# Patient Record
Sex: Female | Born: 1940 | Race: White | Hispanic: No | Smoking: Former smoker
Health system: Southern US, Community
[De-identification: ages and names within clinical notes are randomized; demographics above are authoritative.]

## PROBLEM LIST (undated history)

## (undated) DIAGNOSIS — B019 Varicella without complication: Secondary | ICD-10-CM

## (undated) DIAGNOSIS — H269 Unspecified cataract: Secondary | ICD-10-CM

## (undated) DIAGNOSIS — M6740 Ganglion, unspecified site: Secondary | ICD-10-CM

## (undated) DIAGNOSIS — Z0000 Encounter for general adult medical examination without abnormal findings: Secondary | ICD-10-CM

## (undated) DIAGNOSIS — N6019 Diffuse cystic mastopathy of unspecified breast: Secondary | ICD-10-CM

## (undated) DIAGNOSIS — E663 Overweight: Secondary | ICD-10-CM

## (undated) DIAGNOSIS — R05 Cough: Secondary | ICD-10-CM

## (undated) DIAGNOSIS — M720 Palmar fascial fibromatosis [Dupuytren]: Secondary | ICD-10-CM

## (undated) DIAGNOSIS — M25562 Pain in left knee: Secondary | ICD-10-CM

## (undated) DIAGNOSIS — E119 Type 2 diabetes mellitus without complications: Secondary | ICD-10-CM

## (undated) DIAGNOSIS — E785 Hyperlipidemia, unspecified: Secondary | ICD-10-CM

## (undated) DIAGNOSIS — E7800 Pure hypercholesterolemia, unspecified: Secondary | ICD-10-CM

## (undated) DIAGNOSIS — M8580 Other specified disorders of bone density and structure, unspecified site: Secondary | ICD-10-CM

## (undated) DIAGNOSIS — H918X9 Other specified hearing loss, unspecified ear: Secondary | ICD-10-CM

## (undated) DIAGNOSIS — M1990 Unspecified osteoarthritis, unspecified site: Secondary | ICD-10-CM

## (undated) DIAGNOSIS — R197 Diarrhea, unspecified: Secondary | ICD-10-CM

## (undated) DIAGNOSIS — E1169 Type 2 diabetes mellitus with other specified complication: Secondary | ICD-10-CM

## (undated) DIAGNOSIS — L821 Other seborrheic keratosis: Secondary | ICD-10-CM

## (undated) DIAGNOSIS — E669 Obesity, unspecified: Secondary | ICD-10-CM

## (undated) DIAGNOSIS — M79602 Pain in left arm: Secondary | ICD-10-CM

## (undated) DIAGNOSIS — R5383 Other fatigue: Secondary | ICD-10-CM

## (undated) DIAGNOSIS — R5381 Other malaise: Secondary | ICD-10-CM

## (undated) DIAGNOSIS — K5900 Constipation, unspecified: Secondary | ICD-10-CM

## (undated) DIAGNOSIS — B059 Measles without complication: Secondary | ICD-10-CM

## (undated) DIAGNOSIS — B269 Mumps without complication: Secondary | ICD-10-CM

## (undated) HISTORY — DX: Constipation, unspecified: K59.00

## (undated) HISTORY — DX: Hyperlipidemia, unspecified: E78.5

## (undated) HISTORY — DX: Pain in left knee: M25.562

## (undated) HISTORY — DX: Varicella without complication: B01.9

## (undated) HISTORY — DX: Type 2 diabetes mellitus without complications: E11.9

## (undated) HISTORY — PX: BREAST BIOPSY: SHX20

## (undated) HISTORY — DX: Other malaise: R53.81

## (undated) HISTORY — DX: Encounter for general adult medical examination without abnormal findings: Z00.00

## (undated) HISTORY — DX: Cough: R05

## (undated) HISTORY — DX: Other specified disorders of bone density and structure, unspecified site: M85.80

## (undated) HISTORY — DX: Measles without complication: B05.9

## (undated) HISTORY — DX: Other specified hearing loss, unspecified ear: H91.8X9

## (undated) HISTORY — DX: Diffuse cystic mastopathy of unspecified breast: N60.19

## (undated) HISTORY — DX: Mumps without complication: B26.9

## (undated) HISTORY — DX: Unspecified osteoarthritis, unspecified site: M19.90

## (undated) HISTORY — DX: Unspecified cataract: H26.9

## (undated) HISTORY — DX: Type 2 diabetes mellitus with other specified complication: E11.69

## (undated) HISTORY — DX: Ganglion, unspecified site: M67.40

## (undated) HISTORY — DX: Palmar fascial fibromatosis (dupuytren): M72.0

## (undated) HISTORY — DX: Overweight: E66.3

## (undated) HISTORY — DX: Other fatigue: R53.83

## (undated) HISTORY — DX: Other seborrheic keratosis: L82.1

## (undated) HISTORY — DX: Obesity, unspecified: E66.9

## (undated) HISTORY — DX: Diarrhea, unspecified: R19.7

## (undated) HISTORY — DX: Pain in left arm: M79.602

---
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
## 2004-07-12 ENCOUNTER — Ambulatory Visit (HOSPITAL_COMMUNITY): Admission: RE | Admit: 2004-07-12 | Discharge: 2004-07-12 | Payer: Self-pay | Admitting: Internal Medicine

## 2005-08-29 ENCOUNTER — Other Ambulatory Visit: Admission: RE | Admit: 2005-08-29 | Discharge: 2005-08-29 | Payer: Self-pay | Admitting: Internal Medicine

## 2010-06-01 ENCOUNTER — Encounter (INDEPENDENT_AMBULATORY_CARE_PROVIDER_SITE_OTHER): Payer: Self-pay | Admitting: *Deleted

## 2010-06-29 ENCOUNTER — Encounter (INDEPENDENT_AMBULATORY_CARE_PROVIDER_SITE_OTHER): Payer: Self-pay

## 2010-07-04 ENCOUNTER — Encounter (INDEPENDENT_AMBULATORY_CARE_PROVIDER_SITE_OTHER): Payer: Self-pay

## 2010-07-04 ENCOUNTER — Ambulatory Visit: Payer: Self-pay | Admitting: Gastroenterology

## 2010-07-18 ENCOUNTER — Ambulatory Visit: Payer: Self-pay | Admitting: Gastroenterology

## 2010-07-20 ENCOUNTER — Encounter: Payer: Self-pay | Admitting: Gastroenterology

## 2010-08-31 ENCOUNTER — Ambulatory Visit (HOSPITAL_COMMUNITY): Admission: RE | Admit: 2010-08-31 | Discharge: 2010-08-31 | Payer: Self-pay | Admitting: Internal Medicine

## 2011-01-23 NOTE — Procedures (Signed)
Summary: Colonoscopy  Patient: [name] Note: All result statuses are Final unless otherwise noted.  Tests: (1) Colonoscopy (COL)   COL Colonoscopy           DONE     [facility]     [address]     [place], [place]  [address]           COLONOSCOPY PROCEDURE REPORT           PATIENT:  Gail Lester, Gail Lester  MR#:  [id number]     BIRTHDATE:  May 19, 1941, 69 yrs. old  GENDER:  female           ENDOSCOPIST:  Barbette Hair. Arlyce Dice, MD     Referred by:           PROCEDURE DATE:  07/18/2010     PROCEDURE:  Colonoscopy with snare polypectomy     ASA CLASS:  Class II     INDICATIONS:  1) screening  2) history of pre-cancerous     (adenomatous) colon polyps Last colo 20 years ago           MEDICATIONS:  None           DESCRIPTION OF PROCEDURE:   After the risks benefits and     alternatives of the procedure were thoroughly explained, informed     consent was obtained.  Digital rectal exam was performed and     revealed no abnormalities.   The [id number] [id number] endoscope was     introduced through the anus and advanced to the cecum, which was     identified by the ileocecal valve, without limitations.  The     quality of the prep was excellent, using MoviPrep.  The instrument     was then slowly withdrawn as the colon was fully examined.     <<PROCEDUREIMAGES>>           FINDINGS:  A sessile polyp was found in the cecum. It was 6 mm in     size. Polyp was snared without cautery. Retrieval was successful     (see image3). snare polyp  Moderate diverticulosis was found in     the sigmoid to descending colon segments (see image15 and     image14).  Internal hemorrhoids were found (see image17).  This     was otherwise a normal examination of the colon (see image4,     image5, image6, image7, image9, image13, and image16).     Retroflexed views in the rectum revealed no abnormalities.    The     time to cecum =  4.0  minutes. The scope was then withdrawn (time     =  7.75  min) from the  patient and the procedure completed.           COMPLICATIONS:  None           ENDOSCOPIC IMPRESSION:     1) 6 mm sessile polyp in the cecum     2) Moderate diverticulosis in the sigmoid to descending colon     segments     3) Internal hemorrhoids     4) Otherwise normal examination     RECOMMENDATIONS:     1) If the polyp(s) removed today are proven to be adenomatous     (pre-cancerous) polyps, you will need a repeat colonoscopy in 5     years. Otherwise you should continue to follow colorectal cancer     screening guidelines for "routine risk" patients  with colonoscopy     in 10 years.           REPEAT EXAM:   You will receive a letter from Dr. Arlyce Dice in 1-2     weeks, after reviewing the final pathology, with followup     recommendations.           ______________________________     Barbette Hair Arlyce Dice, MD           CC: Marisue Brooklyn, DO           n.     eSIGNED:   Barbette Hair. Kindal Ponti at 07/18/2010 10:23 AM           Page 2 of 3   Kathryne Sharper, [id number]  Note: An exclamation mark (!) indicates a result that was not dispersed into the flowsheet. Document Creation Date: 07/18/2010 10:24 AM _______________________________________________________________________  (1) Order result status: Final Collection or observation date-time: 07/18/2010 10:16 Requested date-time:  Receipt date-time:  Reported date-time:  Referring Physician:   Ordering Physician: Melvia Heaps [phone]) Specimen Source:  Source: Launa Grill Order Number: [id number] Lab site:   Appended Document: Colonoscopy     Procedures Next Due Date:    Colonoscopy: 07/2015

## 2011-01-23 NOTE — Letter (Signed)
Summary: Diabetic Instructions  [facility]  [address] [place], [place] [address]   Phone: [phone]  Fax: [phone]    Gail Lester Oct 12, 1941 MRN: [id number]   _  _   ORAL DIABETIC MEDICATION INSTRUCTIONS  The day before your procedure:   Take your diabetic pill as you do normally  The day of your procedure:   Do not take your diabetic pill    We will check your blood sugar levels during the admission process and again in Recovery before discharging you home  ________________________________________________________________________  _  _   INSULIN (LONG ACTING) MEDICATION INSTRUCTIONS (Lantus, NPH, 70/30, Humulin, Novolin-N)   The day before your procedure:   Take  your regular evening dose    The day of your procedure:   Do not take your morning dose    _  _   INSULIN (SHORT ACTING) MEDICATION INSTRUCTIONS (Regular, Humulog, Novolog)   The day before your procedure:   Do not take your evening dose   The day of your procedure:   Do not take your morning dose   _  _   INSULIN PUMP MEDICATION INSTRUCTIONS  We will contact the physician managing your diabetic care for written dosage instructions for the day before your procedure and the day of your procedure.  Once we have received the instructions, we will contact you.

## 2011-01-23 NOTE — Letter (Signed)
Summary: Previsit letter  [facility]  [address] [place], [place] [address]   Phone: [phone]  Fax: [phone]       06/01/2010 MRN: [id number]  Gail Lester [address] [place], [place]  [address]  Dear Gail Lester,  Welcome to the Gastroenterology Division at [facility].    You are scheduled to see a nurse for your pre-procedure visit on 07-04-10 at 10:30a.m. on the 3rd floor at [facility], [address]  We ask that you try to arrive at our office 15 minutes prior to your appointment time to allow for check-in.  Your nurse visit will consist of discussing your medical and surgical history, your immediate family medical history, and your medications.    Please bring a complete list of all your medications or, if you prefer, bring the medication bottles and we will list them.  We will need to be aware of both prescribed and over the counter drugs.  We will need to know exact dosage information as well.  If you are on blood thinners (Coumadin, Plavix, Aggrenox, Ticlid, etc.) please call our office today/prior to your appointment, as we need to consult with your physician about holding your medication.   Please be prepared to read and sign documents such as consent forms, a financial agreement, and acknowledgement forms.  If necessary, and with your consent, a friend or relative is welcome to sit-in on the nurse visit with you.  Please bring your insurance card so that we may make a copy of it.  If your insurance requires a referral to see a specialist, please bring your referral form from your primary care physician.  No co-pay is required for this nurse visit.     If you cannot keep your appointment, please call [phone] to cancel or reschedule prior to your appointment date.  This allows [place] the opportunity to schedule an appointment for another patient in need of care.    Thank you for choosing [facility] for your medical  needs.  We appreciate the opportunity to care for you.  Please visit [place] at our website  to learn more about our practice.                     Sincerely.                                                                                                                   The Gastroenterology Division

## 2011-01-23 NOTE — Letter (Signed)
Summary: [facility] Instructions  [facility]  [address] [place], [place] [address]   Phone: [phone]  Fax: [phone]       Gail Lester    March 04, 1941    MRN: [id number]        Procedure Day /Date:  Tuesday 7/26     Arrival Time: 9:00 am      Procedure Time: 10:00 am     Location of Procedure:                    _ x_  [facility] (4th Floor)                        PREPARATION FOR COLONOSCOPY WITH MOVIPREP   Starting 5 days prior to your procedure Thursday 7/21 do not eat nuts, seeds, popcorn, corn, beans, peas,  salads, or any raw vegetables.  Do not take any fiber supplements (e.g. Metamucil, Citrucel, and Benefiber).  THE DAY BEFORE YOUR PROCEDURE         DATE: Monday 7/25  1.  Drink clear liquids the entire day-NO SOLID FOOD  2.  Do not drink anything colored red or purple.  Avoid juices with pulp.  No orange juice.  3.  Drink at least 64 oz. (8 glasses) of fluid/clear liquids during the day to prevent dehydration and help the prep work efficiently.  CLEAR LIQUIDS INCLUDE: Water Jello Ice Popsicles Tea (sugar ok, no milk/cream) Powdered fruit flavored drinks Coffee (sugar ok, no milk/cream) Gatorade Juice: apple, white grape, white cranberry  Lemonade Clear bullion, consomm, broth Carbonated beverages (any kind) Strained chicken noodle soup Hard Candy                             4.  In the morning, mix first dose of MoviPrep solution:    Empty 1 Pouch A and 1 Pouch B into the disposable container    Add lukewarm drinking water to the top line of the container. Mix to dissolve    Refrigerate (mixed solution should be used within 24 hrs)  5.  Begin drinking the prep at 5:00 p.m. The MoviPrep container is divided by 4 marks.   Every 15 minutes drink the solution down to the next mark (approximately 8 oz) until the full liter is complete.   6.  Follow completed prep with 16 oz of clear liquid of your choice (Nothing red or  purple).  Continue to drink clear liquids until bedtime.  7.  Before going to bed, mix second dose of MoviPrep solution:    Empty 1 Pouch A and 1 Pouch B into the disposable container    Add lukewarm drinking water to the top line of the container. Mix to dissolve    Refrigerate  THE DAY OF YOUR PROCEDURE      DATE: Tuesday 7/26  Beginning at 5:00 a.m. (5 hours before procedure):         1. Every 15 minutes, drink the solution down to the next mark (approx 8 oz) until the full liter is complete.  2. Follow completed prep with 16 oz. of clear liquid of your choice.    3. You may drink clear liquids until 8:00 am  (2 HOURS BEFORE PROCEDURE).   MEDICATION INSTRUCTIONS  Unless otherwise instructed, you should take regular prescription medications with a small sip of water   as early as possible the morning  of your procedure.         OTHER INSTRUCTIONS  You will need a responsible adult at least 70 years of age to accompany you and drive you home.   This person must remain in the waiting room during your procedure.  Wear loose fitting clothing that is easily removed.  Leave jewelry and other valuables at home.  However, you may wish to bring a book to read or  an iPod/MP3 player to listen to music as you wait for your procedure to start.  Remove all body piercing jewelry and leave at home.  Total time from sign-in until discharge is approximately 2-3 hours.  You should go home directly after your procedure and rest.  You can resume normal activities the  day after your procedure.  The day of your procedure you should not:   Drive   Make legal decisions   Operate machinery   Drink alcohol   Return to work  You will receive specific instructions about eating, activities and medications before you leave.    The above instructions have been reviewed and explained to me by   Ulis Rias RN  July 04, 2010 10:53 AM     I fully understand and can verbalize  these instructions _____________________________ Date _________

## 2011-01-23 NOTE — Letter (Signed)
Summary: Patient Notice- Polyp Results  [facility]  [address] [place], [place] [address]   Phone: [phone]  Fax: [phone]        July 20, 2010 MRN: [id number]    [facility] [address] [place], [place]  [address]    Dear Ms. Celestine,  I am pleased to inform you that the colon polyp(s) removed during your recent colonoscopy was (were) found to be benign (no cancer detected) upon pathologic examination.  I recommend you have a repeat colonoscopy examination in 5_ years to look for recurrent polyps, as having colon polyps increases your risk for having recurrent polyps or even colon cancer in the future.  Should you develop new or worsening symptoms of abdominal pain, bowel habit changes or bleeding from the rectum or bowels, please schedule an evaluation with either your primary care physician or with me.  Additional information/recommendations:  __ No further action with gastroenterology is needed at this time. Please      follow-up with your primary care physician for your other healthcare      needs.  __ Please call [phone] to schedule a return visit to review your      situation.  __ Please keep your follow-up visit as already scheduled.  _x_ Continue treatment plan as outlined the day of your exam.  Please call us if you are having persistent problems or have questions about your condition that have not been fully answered at this time.  Sincerely,  Louis Meckel MD  This letter has been electronically signed by your physician.  Appended Document: Patient Notice- Polyp Results letter mailed

## 2011-01-23 NOTE — Miscellaneous (Signed)
Summary: Lec previsit  Clinical Lists Changes  Medications: Added new medication of MOVIPREP 100 GM  SOLR (PEG-KCL-NACL-NASULF-NA ASC-C) As per prep instructions. - Signed Rx of MOVIPREP 100 GM  SOLR (PEG-KCL-NACL-NASULF-NA ASC-C) As per prep instructions.;  #1 x 0;  Signed;  Entered by: Ulis Rias RN;  Authorized by: Louis Meckel MD;  Method used: Electronically to [facility]  [phone]*, [address], [place], [place]  [address], Ph: [phone] or [phone], Fax: [phone] Observations: Added new observation of NKA: T (07/04/2010 10:19)    Prescriptions: MOVIPREP 100 GM  SOLR (PEG-KCL-NACL-NASULF-NA ASC-C) As per prep instructions.  #1 x 0   Entered by:   Ulis Rias RN   Authorized by:   Louis Meckel MD   Signed by:   Ulis Rias RN on 07/04/2010   Method used:   Electronically to        [facility]  [phone]* (retail)       [address] [place], [place]  [address]       Ph: [phone] or [phone]       Fax: [phone]   RxID:   [id number]

## 2011-03-10 LAB — GLUCOSE, CAPILLARY: Glucose-Capillary: 144 mg/dL — ABNORMAL HIGH (ref 70–99)

## 2011-12-08 ENCOUNTER — Emergency Department (HOSPITAL_COMMUNITY): Payer: Medicare Other

## 2011-12-08 ENCOUNTER — Inpatient Hospital Stay (HOSPITAL_COMMUNITY)
Admission: EM | Admit: 2011-12-08 | Discharge: 2011-12-10 | DRG: 512 | Disposition: A | Discharge status: Home / Self Care | Payer: Medicare Other | Attending: Orthopedic Surgery | Admitting: Orthopedic Surgery

## 2011-12-08 ENCOUNTER — Inpatient Hospital Stay (HOSPITAL_COMMUNITY): Payer: Medicare Other

## 2011-12-08 DIAGNOSIS — S52509A Unspecified fracture of the lower end of unspecified radius, initial encounter for closed fracture: Principal | ICD-10-CM | POA: Diagnosis present

## 2011-12-08 DIAGNOSIS — S52609A Unspecified fracture of lower end of unspecified ulna, initial encounter for closed fracture: Principal | ICD-10-CM | POA: Diagnosis present

## 2011-12-08 DIAGNOSIS — W010XXA Fall on same level from slipping, tripping and stumbling without subsequent striking against object, initial encounter: Secondary | ICD-10-CM | POA: Diagnosis present

## 2011-12-08 DIAGNOSIS — E119 Type 2 diabetes mellitus without complications: Secondary | ICD-10-CM | POA: Diagnosis present

## 2011-12-08 HISTORY — DX: Pure hypercholesterolemia, unspecified: E78.00

## 2011-12-08 LAB — DIFFERENTIAL
Basophils Absolute: 0 10*3/uL (ref 0.0–0.1)
Eosinophils Relative: 0 % (ref 0–5)
Lymphocytes Relative: 15 % (ref 12–46)
Lymphs Abs: 1.7 10*3/uL (ref 0.7–4.0)
Monocytes Absolute: 0.5 10*3/uL (ref 0.1–1.0)

## 2011-12-08 LAB — BASIC METABOLIC PANEL
CO2: 22 mEq/L (ref 19–32)
Calcium: 9.2 mg/dL (ref 8.4–10.5)
Creatinine, Ser: 0.5 mg/dL (ref 0.50–1.10)
Glucose, Bld: 121 mg/dL — ABNORMAL HIGH (ref 70–99)

## 2011-12-08 LAB — CBC
HCT: 38.5 % (ref 36.0–46.0)
MCH: 29.1 pg (ref 26.0–34.0)
MCV: 88.9 fL (ref 78.0–100.0)
RDW: 12.8 % (ref 11.5–15.5)
WBC: 11.3 10*3/uL — ABNORMAL HIGH (ref 4.0–10.5)

## 2011-12-08 MED ORDER — METHOCARBAMOL 500 MG PO TABS
500.0000 mg | ORAL_TABLET | Freq: Four times a day (QID) | ORAL | Status: DC | PRN
  Administered 2011-12-08: 500 mg via ORAL
  Filled 2011-12-08: qty 1

## 2011-12-08 MED ORDER — ONDANSETRON HCL 4 MG PO TABS: 4.0000 mg | ORAL_TABLET | Freq: Four times a day (QID) | ORAL | Status: DC | PRN

## 2011-12-08 MED ORDER — DOCUSATE SODIUM 100 MG PO CAPS
100.0000 mg | ORAL_CAPSULE | Freq: Two times a day (BID) | ORAL | Status: DC
  Administered 2011-12-09: 100 mg via ORAL
  Filled 2011-12-08 (×5): qty 1

## 2011-12-08 MED ORDER — HYDROCODONE-ACETAMINOPHEN 5-325 MG PO TABS
1.0000 | ORAL_TABLET | ORAL | Status: DC | PRN
  Administered 2011-12-08 – 2011-12-09 (×3): 1 via ORAL
  Administered 2011-12-09: 2 via ORAL
  Administered 2011-12-09: 1 via ORAL
  Administered 2011-12-10 (×2): 2 via ORAL
  Filled 2011-12-08 (×3): qty 1
  Filled 2011-12-08: qty 2
  Filled 2011-12-08: qty 1
  Filled 2011-12-08 (×2): qty 2

## 2011-12-08 MED ORDER — ROSUVASTATIN CALCIUM 5 MG PO TABS
5.0000 mg | ORAL_TABLET | Freq: Every day | ORAL | Status: DC
  Administered 2011-12-09 – 2011-12-10 (×2): 5 mg via ORAL
  Filled 2011-12-08 (×3): qty 1

## 2011-12-08 MED ORDER — ONDANSETRON HCL 4 MG/2ML IJ SOLN: 4.0000 mg | Freq: Four times a day (QID) | INTRAMUSCULAR | Status: DC | PRN

## 2011-12-08 MED ORDER — CEFAZOLIN SODIUM 1-5 GM-% IV SOLN
1.0000 g | INTRAVENOUS | Status: AC
  Administered 2011-12-09: 1 g via INTRAVENOUS
  Filled 2011-12-08: qty 50

## 2011-12-08 MED ORDER — GLIMEPIRIDE 1 MG PO TABS
1.0000 mg | ORAL_TABLET | Freq: Every day | ORAL | Status: DC
  Administered 2011-12-10: 1 mg via ORAL
  Filled 2011-12-08 (×3): qty 1

## 2011-12-08 MED ORDER — MORPHINE SULFATE 2 MG/ML IJ SOLN
1.0000 mg | INTRAMUSCULAR | Status: DC | PRN
  Administered 2011-12-09 – 2011-12-10 (×2): 1 mg via INTRAVENOUS
  Filled 2011-12-08 (×2): qty 1

## 2011-12-08 MED ORDER — METHOCARBAMOL 100 MG/ML IJ SOLN
500.0000 mg | Freq: Four times a day (QID) | INTRAVENOUS | Status: DC | PRN
  Filled 2011-12-08: qty 5

## 2011-12-08 MED ORDER — POTASSIUM CHLORIDE IN NACL 20-0.45 MEQ/L-% IV SOLN
INTRAVENOUS | Status: DC
  Administered 2011-12-08: 21:00:00 via INTRAVENOUS
  Filled 2011-12-08 (×3): qty 1000

## 2011-12-08 MED ORDER — OXYCODONE HCL 5 MG PO TABS
5.0000 mg | ORAL_TABLET | ORAL | Status: DC | PRN
  Administered 2011-12-08: 5 mg via ORAL
  Filled 2011-12-08: qty 2

## 2011-12-08 MED ORDER — VITAMIN C 500 MG PO TABS
1000.0000 mg | ORAL_TABLET | Freq: Every day | ORAL | Status: DC
  Administered 2011-12-09 – 2011-12-10 (×2): 1000 mg via ORAL
  Filled 2011-12-08 (×3): qty 2

## 2011-12-08 MED ORDER — METFORMIN HCL 500 MG PO TABS
1000.0000 mg | ORAL_TABLET | Freq: Two times a day (BID) | ORAL | Status: DC
  Administered 2011-12-08: 500 mg via ORAL
  Administered 2011-12-09 – 2011-12-10 (×2): 1000 mg via ORAL
  Filled 2011-12-08 (×6): qty 2

## 2011-12-08 MED ORDER — LIDOCAINE HCL 2 % IJ SOLN
20.0000 mL | Freq: Once | INTRAMUSCULAR | Status: AC
  Administered 2011-12-08: 400 mg

## 2011-12-08 MED ORDER — THERA M PLUS PO TABS
1.0000 | ORAL_TABLET | Freq: Every day | ORAL | Status: DC
  Administered 2011-12-09 – 2011-12-10 (×2): 1 via ORAL
  Filled 2011-12-08 (×3): qty 1

## 2011-12-08 MED ORDER — HYDROCODONE-ACETAMINOPHEN 5-325 MG PO TABS: 1.0000 | ORAL_TABLET | Freq: Four times a day (QID) | ORAL | Status: AC | PRN

## 2011-12-08 NOTE — ED Notes (Signed)
Dr Melvyn Novas at bedside to reduce left wrist fx.

## 2011-12-08 NOTE — ED Provider Notes (Addendum)
History     CSN: [id number] Arrival date & time: 12/08/2011  2:13 PM   First MD Initiated Contact with Patient 12/08/11 1459      Chief Complaint  Patient presents with  . Fall    (Consider location/radiation/quality/duration/timing/severity/associated sxs/prior treatment) Patient is a 70 y.o. female presenting with fall. The history is provided by the patient.  Fall Incident onset: Today. Incident: Patient accidentally fell off a step on her porch. She landed on her left buttock and her left wrist. Distance fallen: Less than a foot. She landed on dirt. There was no blood loss. The pain is present in the left wrist. The pain is moderate. She was ambulatory at the scene. There was no entrapment after the fall. Pertinent negatives include no numbness, no abdominal pain, no headaches and no loss of consciousness. Exacerbated by: Movement and palpation.    Past Medical History  Diagnosis Date  . Diabetes mellitus   . Hypercholesteremia     Past Surgical History  Procedure Date  . Appendectomy     History reviewed. No pertinent family history.  History  Substance Use Topics  . Smoking status: Never Smoker   . Smokeless tobacco: Not on file  . Alcohol Use: No    OB History    Grav Para Term Preterm Abortions TAB SAB Ect Mult Living                  Review of Systems  Gastrointestinal: Negative for abdominal pain.  Neurological: Negative for loss of consciousness, numbness and headaches.  All other systems reviewed and are negative.    Allergies  Review of patient's allergies indicates no known allergies.  Home Medications   Current Outpatient Rx  Name Route Sig Dispense Refill  . GLIMEPIRIDE 4 MG PO TABS Oral Take 1 mg by mouth daily before breakfast.      . METFORMIN HCL 500 MG PO TABS Oral Take 1,000 mg by mouth 2 (two) times daily with a meal.      . ROSUVASTATIN CALCIUM 5 MG PO TABS Oral Take 5 mg by mouth daily.        BP 150/95  Pulse 92  Temp(Src)  98.2 F (36.8 C) (Oral)  Resp 18  Ht 5\' 4"  (1.626 m)  Wt 170 lb (77.111 kg)  BMI 29.18 kg/m2  SpO2 96%  Physical Exam  Nursing note and vitals reviewed. Constitutional: She appears well-developed and well-nourished. No distress.  HENT:  Head: Normocephalic and atraumatic.  Right Ear: External ear normal.  Left Ear: External ear normal.  Eyes: Conjunctivae are normal. Right eye exhibits no discharge. Left eye exhibits no discharge. No scleral icterus.  Neck: Neck supple. No tracheal deviation present.  Cardiovascular: Normal rate.   Pulmonary/Chest: Effort normal. No stridor. No respiratory distress.  Musculoskeletal: She exhibits edema and tenderness.       Left shoulder: Normal.       Left elbow: Normal.       Left wrist: She exhibits decreased range of motion, tenderness, bony tenderness, swelling and deformity. She exhibits no laceration.       Entire spine nontender, no tenderness left elbow, gross deformity left wrist  Neurological: She is alert. Cranial nerve deficit: no gross deficits.  Skin: Skin is warm and dry. No rash noted.  Psychiatric: She has a normal mood and affect.    ED Course  Procedures (including critical care time)  Labs Reviewed - No data to display Dg Wrist Complete Left  12/08/2011  *  RADIOLOGY REPORT*  Clinical Data: Larey Seat.  Injured left wrist.  LEFT WRIST - COMPLETE 3+ VIEW  Comparison: None  Findings: There is a dorsally impacted distal radius fracture with comminution and intra-articular involvement.  An ulnar styloid fracture is also noted.  The carpal and metacarpal bones are intact.  IMPRESSION: Dorsally impacted, comminuted intra-articular fracture of the distal radius. Ulnar styloid avulsion fracture.  Original Report Authenticated By: P. Loralie Champagne, M.D.    Diagnosis: Distal radius fracture with comminution and intra-articular involvement  MDM  Patient has a displaced intra-articular distal radius fracture. This likely will require  manipulation and reduction. I will consult orthopedics, hand surgery.  Dr Orlan Leavens will evaluate the patient in the ED.        Celene Kras, MD 12/08/11 1554  Celene Kras, MD 12/08/11 [phone]

## 2011-12-08 NOTE — ED Notes (Signed)
[phone]

## 2011-12-08 NOTE — ED Notes (Signed)
Pt. Fell  Off her porch onto her lt. Buttock and broke the fall with her lt. Arm.   Lt. Wrist is deformed, she denies any pain, +radial pulse, warm to touch

## 2011-12-09 ENCOUNTER — Inpatient Hospital Stay (HOSPITAL_COMMUNITY): Payer: Medicare Other | Admitting: Anesthesiology

## 2011-12-09 ENCOUNTER — Encounter (HOSPITAL_COMMUNITY)
Admission: EM | Disposition: A | Discharge status: Home / Self Care | Payer: Self-pay | Source: Home / Self Care | Attending: Orthopedic Surgery

## 2011-12-09 ENCOUNTER — Encounter (HOSPITAL_COMMUNITY): Payer: Self-pay | Admitting: Anesthesiology

## 2011-12-09 ENCOUNTER — Other Ambulatory Visit: Payer: Self-pay

## 2011-12-09 ENCOUNTER — Encounter (HOSPITAL_COMMUNITY): Payer: Self-pay | Admitting: *Deleted

## 2011-12-09 HISTORY — PX: ORIF WRIST FRACTURE: SHX2133

## 2011-12-09 LAB — GLUCOSE, CAPILLARY
Glucose-Capillary: 114 mg/dL — ABNORMAL HIGH (ref 70–99)
Glucose-Capillary: 126 mg/dL — ABNORMAL HIGH (ref 70–99)

## 2011-12-09 SURGERY — OPEN REDUCTION INTERNAL FIXATION (ORIF) WRIST FRACTURE
Anesthesia: General | Site: Wrist | Laterality: Left | Wound class: Clean

## 2011-12-09 MED ORDER — FENTANYL CITRATE 0.05 MG/ML IJ SOLN: 25.0000 ug | INTRAMUSCULAR | Status: DC | PRN

## 2011-12-09 MED ORDER — LACTATED RINGERS IV SOLN
INTRAVENOUS | Status: DC | PRN
  Administered 2011-12-09: 07:00:00 via INTRAVENOUS

## 2011-12-09 MED ORDER — 0.9 % SODIUM CHLORIDE (POUR BTL) OPTIME
TOPICAL | Status: DC | PRN
  Administered 2011-12-09: 1000 mL

## 2011-12-09 MED ORDER — ROPIVACAINE HCL 5 MG/ML IJ SOLN
INTRAMUSCULAR | Status: DC | PRN
  Administered 2011-12-09: 25 mL via EPIDURAL

## 2011-12-09 MED ORDER — METOCLOPRAMIDE HCL 5 MG/ML IJ SOLN
INTRAMUSCULAR | Status: DC | PRN
  Administered 2011-12-09: 10 mg via INTRAVENOUS

## 2011-12-09 MED ORDER — PROPOFOL 10 MG/ML IV EMUL
INTRAVENOUS | Status: DC | PRN
  Administered 2011-12-09: 200 mg via INTRAVENOUS

## 2011-12-09 MED ORDER — ASCORBIC ACID 1000 MG PO TABS: 1000.0000 mg | ORAL_TABLET | Freq: Every day | ORAL | Status: AC

## 2011-12-09 MED ORDER — METOCLOPRAMIDE HCL 5 MG/ML IJ SOLN
10.0000 mg | Freq: Once | INTRAMUSCULAR | Status: AC | PRN
  Filled 2011-12-09: qty 2

## 2011-12-09 MED ORDER — MIDAZOLAM HCL 5 MG/5ML IJ SOLN
INTRAMUSCULAR | Status: DC | PRN
  Administered 2011-12-09: 2 mg via INTRAVENOUS

## 2011-12-09 MED ORDER — DOCUSATE SODIUM 100 MG PO CAPS: 100.0000 mg | ORAL_CAPSULE | Freq: Two times a day (BID) | ORAL | Status: AC

## 2011-12-09 MED ORDER — CEFAZOLIN SODIUM 1-5 GM-% IV SOLN
1.0000 g | Freq: Three times a day (TID) | INTRAVENOUS | Status: DC
  Administered 2011-12-09 – 2011-12-10 (×3): 1 g via INTRAVENOUS
  Filled 2011-12-09 (×5): qty 50

## 2011-12-09 MED ORDER — ONDANSETRON HCL 4 MG/2ML IJ SOLN
INTRAMUSCULAR | Status: DC | PRN
  Administered 2011-12-09: 4 mg via INTRAVENOUS

## 2011-12-09 MED ORDER — FENTANYL CITRATE 0.05 MG/ML IJ SOLN
INTRAMUSCULAR | Status: DC | PRN
  Administered 2011-12-09: 100 ug via INTRAVENOUS

## 2011-12-09 MED ORDER — HYDROCODONE-ACETAMINOPHEN 5-325 MG PO TABS: 1.0000 | ORAL_TABLET | ORAL | Status: AC | PRN

## 2011-12-09 MED ORDER — LIDOCAINE HCL 1 % IJ SOLN
INTRAMUSCULAR | Status: DC | PRN
  Administered 2011-12-09: 2 mL via INTRADERMAL

## 2011-12-09 SURGICAL SUPPLY — 63 items
BANDAGE ACE 4 STERILE (GAUZE/BANDAGES/DRESSINGS) ×2 IMPLANT
BANDAGE ELASTIC 3 VELCRO ST LF (GAUZE/BANDAGES/DRESSINGS) ×2 IMPLANT
BANDAGE ELASTIC 4 VELCRO ST LF (GAUZE/BANDAGES/DRESSINGS) ×2 IMPLANT
BANDAGE GAUZE ELAST BULKY 4 IN (GAUZE/BANDAGES/DRESSINGS) ×2 IMPLANT
BIT DRILL 2 FAST STEP (BIT) ×2 IMPLANT
BIT DRILL 2.5X4 QC (BIT) ×2 IMPLANT
BLADE SURG ROTATE 9660 (MISCELLANEOUS) IMPLANT
BNDG ESMARK 4X9 LF (GAUZE/BANDAGES/DRESSINGS) ×4 IMPLANT
CLOTH BEACON ORANGE TIMEOUT ST (SAFETY) ×2 IMPLANT
CORDS BIPOLAR (ELECTRODE) ×2 IMPLANT
COVER SURGICAL LIGHT HANDLE (MISCELLANEOUS) ×2 IMPLANT
CUFF TOURNIQUET SINGLE 18IN (TOURNIQUET CUFF) ×2 IMPLANT
CUFF TOURNIQUET SINGLE 24IN (TOURNIQUET CUFF) IMPLANT
DRAIN TLS ROUND 10FR (DRAIN) IMPLANT
DRAPE OEC MINIVIEW 54X84 (DRAPES) ×2 IMPLANT
DRAPE SURG 17X11 SM STRL (DRAPES) ×2 IMPLANT
DRSG ADAPTIC 3X8 NADH LF (GAUZE/BANDAGES/DRESSINGS) ×2 IMPLANT
DRSG EMULSION OIL 3X3 NADH (GAUZE/BANDAGES/DRESSINGS) ×2 IMPLANT
ELECT REM PT RETURN 9FT ADLT (ELECTROSURGICAL)
ELECTRODE REM PT RTRN 9FT ADLT (ELECTROSURGICAL) IMPLANT
GAUZE SPONGE 4X4 12PLY STRL LF (GAUZE/BANDAGES/DRESSINGS) ×2 IMPLANT
GAUZE SPONGE 4X4 16PLY XRAY LF (GAUZE/BANDAGES/DRESSINGS) ×2 IMPLANT
GLOVE BIOGEL PI IND STRL 8.5 (GLOVE) ×1 IMPLANT
GLOVE BIOGEL PI INDICATOR 8.5 (GLOVE) ×1
GLOVE SURG ORTHO 8.0 STRL STRW (GLOVE) ×4 IMPLANT
GOWN PREVENTION PLUS XLARGE (GOWN DISPOSABLE) ×2 IMPLANT
GOWN STRL NON-REIN LRG LVL3 (GOWN DISPOSABLE) ×6 IMPLANT
K-WIRE 1.6 (WIRE) ×2
K-WIRE FX5X1.6XNS BN SS (WIRE) ×1
KIT BASIN OR (CUSTOM PROCEDURE TRAY) ×2 IMPLANT
KIT ROOM TURNOVER OR (KITS) ×2 IMPLANT
KWIRE FX5X1.6XNS BN SS (WIRE) ×1 IMPLANT
MANIFOLD NEPTUNE II (INSTRUMENTS) ×2 IMPLANT
NEEDLE HYPO 25X1 1.5 SAFETY (NEEDLE) ×2 IMPLANT
NS IRRIG 1000ML POUR BTL (IV SOLUTION) ×2 IMPLANT
PACK ORTHO EXTREMITY (CUSTOM PROCEDURE TRAY) ×2 IMPLANT
PAD ARMBOARD 7.5X6 YLW CONV (MISCELLANEOUS) ×4 IMPLANT
PAD CAST 4YDX4 CTTN HI CHSV (CAST SUPPLIES) ×1 IMPLANT
PADDING CAST COTTON 4X4 STRL (CAST SUPPLIES) ×1
PADDING WEBRIL 4 STERILE (GAUZE/BANDAGES/DRESSINGS) ×2 IMPLANT
PEG SUBCHONDRAL SMOOTH 2.0X16 (Peg) ×2 IMPLANT
PEG SUBCHONDRAL SMOOTH 2.0X18 (Peg) ×4 IMPLANT
PEG SUBCHONDRAL SMOOTH 2.0X20 (Peg) ×4 IMPLANT
PEG THREADED 2.5MMX18MM LONG (Peg) ×2 IMPLANT
PLATE SHORT 57 NRRW LT (Plate) ×2 IMPLANT
SCREW BN 12X3.5XNS CORT TI (Screw) ×1 IMPLANT
SCREW CORT 3.5X10 LNG (Screw) ×2 IMPLANT
SCREW CORT 3.5X12 (Screw) ×1 IMPLANT
SOAP 2 % CHG 4 OZ (WOUND CARE) ×2 IMPLANT
SPONGE GAUZE 4X4 12PLY (GAUZE/BANDAGES/DRESSINGS) ×2 IMPLANT
STRIP CLOSURE SKIN 1/2X4 (GAUZE/BANDAGES/DRESSINGS) IMPLANT
SUCTION FRAZIER TIP 10 FR DISP (SUCTIONS) ×2 IMPLANT
SUT ETHILON 4 0 PS 2 18 (SUTURE) ×2 IMPLANT
SUT MNCRL AB 4-0 PS2 18 (SUTURE) IMPLANT
SUT VIC AB 2-0 FS1 27 (SUTURE) ×2 IMPLANT
SUT VICRYL 4-0 PS2 18IN ABS (SUTURE) ×2 IMPLANT
SYR CONTROL 10ML LL (SYRINGE) ×2 IMPLANT
SYSTEM CHEST DRAIN TLS 7FR (DRAIN) IMPLANT
TOWEL OR 17X24 6PK STRL BLUE (TOWEL DISPOSABLE) ×2 IMPLANT
TOWEL OR 17X26 10 PK STRL BLUE (TOWEL DISPOSABLE) ×2 IMPLANT
TUBE CONNECTING 12X1/4 (SUCTIONS) ×2 IMPLANT
WATER STERILE IRR 1000ML POUR (IV SOLUTION) IMPLANT
YANKAUER SUCT BULB TIP NO VENT (SUCTIONS) IMPLANT

## 2011-12-09 NOTE — Addendum Note (Signed)
Addendum  created 12/09/11 1206 by Benedetto Goad   Modules edited:Anesthesia Responsible Staff

## 2011-12-09 NOTE — Anesthesia Preprocedure Evaluation (Addendum)
Anesthesia Evaluation  Patient identified by MRN, date of birth, ID band Patient awake    Reviewed: Allergy & Precautions, H&P , NPO status , Patient's Chart, lab work & pertinent test results, reviewed documented beta blocker date and time   Airway Mallampati: II TM Distance: >3 FB Neck ROM: full    Dental  (+) Dental Advisory Given   Pulmonary neg pulmonary ROS,          Cardiovascular neg cardio ROS  Hypercholesterol   Neuro/Psych Negative Neurological ROS  Negative Psych ROS   GI/Hepatic negative GI ROS, Neg liver ROS,   Endo/Other  Diabetes mellitus-, Type 2, Oral Hypoglycemic Agents  Renal/GU negative Renal ROS  Genitourinary negative   Musculoskeletal   Abdominal   Peds  Hematology negative hematology ROS (+)   Anesthesia Other Findings See surgeon's H&P   Reproductive/Obstetrics negative OB ROS                         Anesthesia Physical Anesthesia Plan  ASA: II  Anesthesia Plan: General   Post-op Pain Management: MAC Combined w/ Regional for Post-op pain   Induction: Intravenous  Airway Management Planned: LMA  Additional Equipment:   Intra-op Plan:   Post-operative Plan: Extubation in OR  Informed Consent: I have reviewed the patients History and Physical, chart, labs and discussed the procedure including the risks, benefits and alternatives for the proposed anesthesia with the patient or authorized representative who has indicated his/her understanding and acceptance.     Plan Discussed with: CRNA, Surgeon and Anesthesiologist  Anesthesia Plan Comments:        Anesthesia Quick Evaluation

## 2011-12-09 NOTE — H&P (Unsigned)
NAMESHAMIRAH, IVAN NO.:  [id number]  MEDICAL RECORD NO.:  [id number]  LOCATION:  5035                         FACILITY:  [facility]  PHYSICIAN:  Madelynn Done, MD  DATE OF BIRTH:  1941/05/17  DATE OF ADMISSION:  12/08/2011 DATE OF DISCHARGE:                             HISTORY & PHYSICAL   REASON FOR ADMISSION:  Left distal radius fracture.  BRIEF HISTORY:  Mrs. Delira is a healthy-appearing 70 year old female who lives at home.  She tripped and fell back off a stair landing on an outstretched left wrist.  The patient sustained a comminuted distal radius fracture.  The patient was seen and evaluated in the ER.  I was asked to see and evaluate the patient in the emergency department given the comminuted fracture and the degree of displacement.  The patient underwent closed manipulation in the ER and under closed manipulation, it was very unstable fracture pattern and difficult to maintain reduction.  The patient will be admitted for IV pain control and schedule operative intervention to restore the alignment of the wrist in the morning.  The patient has no other complaints.  No syncope.  No chest pain.  No dizziness.  No other related symptoms.  No history of trauma to the left wrist.  Her past medical history, past surgical history, medications, allergies, review of systems as noted through the notes of Dr. Lynelle Doctor and documented in the Epic computer system.  PHYSICAL EXAMINATION:  GENERAL:  She is a healthy-appearing female.  She has a normal hand coordination in her right hand.  Normal mood.  She is alert and oriented to person, place, and time. VITAL SIGNS:  As noted in the computer. HEENT:  Pupils equal.  Sclerae are white.  Head atraumatic. NECK:  No visible masses. CHEST:  Equal chest expansion.  No audible wheezing. CARDIOVASCULAR:  Regular radial pulse. ABDOMEN:  Nondistended, nontender. EXTREMITIES:  On examination of the left upper extremity, she  does have the obvious deformity of the distal radius.  She was able to extend her thumb, extend her digits.  Her fingertips are warm, well perfused, good capillary refill, good sensation.  Compartments are soft.  She has good elbow flexion and extension.  She has good shoulder mobility.  Her wrist and forearm rotation are limited.  Her radiographs do show the comminuted and dorsally angulated intra- articular distal radius fracture with a shortening noted.  PROCEDURE NOTE:  After verbal consent was obtained with the patient, 1% Xylocaine hematoma block was performed.  The patient tolerated this well.  Following this, a closed manipulation was then performed and the patient was placed in a well-molded sugar-tong splint.  This will be reduced with using the finger traps and weight and it was very unstable, very difficult to maintain the reduction.  IMPRESSION:  Left wrist highly comminuted distal radius fracture, intra- articular with displacement.  PLAN:  Today, the findings were reviewed with Ms. Borowski.  She lives alone.  We talked about surgical correction and maintaining the overall position of the distal radius, and the patient would like to proceed with operative intervention.  For this, I would recommend, given the displacement, an  open reduction and internal fixation.  The risks of surgery include but not limited to bleeding; infection; damage to nearby nerves, arteries, or tendons; nonunion; malunion; hardware failure; loss of motion of wrist and digits, and need for further surgical intervention.  All questions were answered and encouraged today.  The patient will be admitted, and inpatient orders were carried out.     Madelynn Done, MD     FWO/MEDQ  D:  12/08/2011  T:  12/09/2011  Job:  [id number]

## 2011-12-09 NOTE — Op Note (Signed)
PREOPERATIVE DIAGNOSIS: Left wrist intra-articular distal radius  fracture, 3 or more fragments.  POSTOPERATIVE DIAGNOSIS: Left wrist intra-articular distal radius  fracture, 3 or more fragments.  ATTENDING PHYSICIAN: Sharma Covert IV, MD who scrubbed and present  entire procedure.  ASSISTANT SURGEON: None.  ANESTHESIA: Supraclavicular block performed by Dr. Cipriano Mile and general  anesthesia.  SURGICAL IMPLANTS: [employer] DVR plate, narrow with six distal locking pegs and 3 bicortical screws proximally SURGICAL PROCEDURE:  1. Open treatment of left wrist intra-articular distal radius  fracture, 3 or more fragments.  2. Left wrist brachioradialis tenotomy and release.  3. Radiographs, stress radiographs, left wrist.   SURGICAL INDICATIONS: Gail Lester  is a right-hand-dominant female who sustained an intra-articular distal radius fracture after a fall. The  patient was seen and evaluated in the ED based on degree of  displacement and the  displacement, recommended that she undergo  the above procedure. Risks, benefits, and alternatives were discussed  in detail with the patient. Signed informed consent was obtained.  Risks include, but not limited to bleeding, infection, damage to nearby  nerves, arteries, or tendons, nonunion, malunion, hardware failure, loss  of motion of the elbow, wrist, and digits, and need for further surgical  intervention.   PROCEDURE: The patient was properly identified in the preop holding  area. A mark with a permanent marker was made on the left wrist to  indicate correct operative site. The patient tolerated the block  performed by Anesthesia. The patient was then brought back to the  operating room. The patient received preoperative antibiotics. General  anesthesia was induced. Left upper extremity was prepped and draped in  normal sterile fashion. Time-out was called. Correct site was  identified, and procedure then begun. Attention was then  turned to the  left wrist. The limb was then elevated using Esmarch exsanguination and  tourniquet insufflated. A longitudinal incision was made directly over  the FCR sheath. Dissection was then carried down through the skin and  subcutaneous tissue. The FCR sheath was then opened proximally and  distally. Careful dissection was done going through the floor of the  FCR sheath where the FPL was identified. An L-shaped pronator quadratus  flap was then elevated. In order to aid in reduction tenotomy of the brachioradialis was completed with protection of the first dorsal compartment tendons.  The fracture site was then opened and the patient did have several fracture fragment extendin into the joint greater than 3 part intra-articular fracture. Careful open reduction was then carried out. The appropriate size dvr plate was chosen.. The oblong screw hole was then drilled with a 2.5 mm drill bit, then 3.5 mm bicortical screw. Plate height was adjusted.   After position was then confirmed using mini C-arm, the distal row  fixation was then carried out with the beginning from an ulnar to radial  direction with the  locking pegs. The total of 6 locking pegs were then placed. Following this, attention was then turned proximally where 2 more nonlocking screws were then placed in the 2.5 mm drill bit, the 3.5 mm non-locking screws.   The wound was then thoroughly irrigated. Final stress radiography was then carried out.  Stress radiographs were then obtained under live fluoro showing no widening of the SL interval. I did not see any carpal dissociation with good fixation, without any  evidence of penetration in the articular margin with the locking pegs.    Postop, the pronator quadratus was then closed with 2-0 Vicryl.  Tourniquet was then deflated. Hemostasis was then obtained. The  subcutaneous tissues closed with 4-0 Vicryl and skin closed with simple  nylon sutures. Adaptic dressing and sterile  compressive bandage was  then applied. The patient was then placed in a well-padded volar splint. Extubated and taken to recovery room in good condition.    INTRAOPERATIVE RADIOGRAPHS:, 3 views of the wrist do show  the volar plate fixation in place. There is good position in both  planes.    POSTOPERATIVE PLAN: The patient will be admitted for IV antibiotics and  pain control; discharged in the morning. Seen back in the office for  approximately 10-14 days for wound check, suture removal, and then x-  rays, short-arm cast for total 4 weeks, and then begin a therapy regimen  around a 4-week mark. Radiographs at each visit.    Madelynn Done, MD

## 2011-12-09 NOTE — Preoperative (Signed)
Beta Blockers   Reason not to administer Beta Blockers:Not Applicable 

## 2011-12-09 NOTE — H&P (Signed)
PT SEEN/EXAMINED FULL H/P DICTATED YESTERDAY CONSENT SIGNED R/B/A DISCUSSED WITH PATIENT PT VOICED UNDERSTANDING AND REASON FOR INTERVENTION  TO OR FOR ORIF OF UNSTABLE DISTAL RADIUS FRACTURE.

## 2011-12-09 NOTE — Brief Op Note (Signed)
12/08/2011 - 12/09/2011  10:01 AM  PATIENT:  [name]  70 y.o. female  PRE-OPERATIVE DIAGNOSIS:  Left distal radius fracture  POST-OPERATIVE DIAGNOSIS:  left distal radius fracture  PROCEDURE:  Procedure(s): OPEN REDUCTION INTERNAL FIXATION (ORIF) WRIST FRACTURE  SURGEON:  Surgeon(s): Sharma Covert  PHYSICIAN ASSISTANT:   ASSISTANTS: none   ANESTHESIA:   general  EBL:  Total I/O In: 800 [I.V.:800] Out: -   BLOOD ADMINISTERED:none  DRAINS: none   LOCAL MEDICATIONS USED:  NONE  SPECIMEN:  No Specimen  DISPOSITION OF SPECIMEN:  N/A  COUNTS:  YES  TOURNIQUET:   Total Tourniquet Time Documented: Upper Arm (Left) - 45 minutes  DICTATION: .Typed  PLAN OF CARE: Admit to inpatient   PATIENT DISPOSITION:  PACU - hemodynamically stable.   Delay start of Pharmacological VTE agent (>24hrs) due to surgical blood loss or risk of bleeding:  {YES/NO/NOT APPLICABLE:20182

## 2011-12-09 NOTE — Addendum Note (Signed)
Addendum  created 12/09/11 1310 by Benedetto Goad   Modules edited:Charges VN

## 2011-12-09 NOTE — Transfer of Care (Signed)
Immediate Anesthesia Transfer of Care Note  Patient: Gail Lester  Procedure(s) Performed:  OPEN REDUCTION INTERNAL FIXATION (ORIF) WRIST FRACTURE  Patient Location: PACU  Anesthesia Type: General and GA combined with regional for post-op pain  Level of Consciousness: awake, alert , oriented and patient cooperative  Airway & Oxygen Therapy: Patient Spontanous Breathing and Patient connected to nasal cannula oxygen  Post-op Assessment: Report given to PACU RN and Post -op Vital signs reviewed and stable  Post vital signs: Reviewed and stable  Complications: No apparent anesthesia complications

## 2011-12-09 NOTE — Discharge Summary (Signed)
  Physician Discharge Summary  Patient ID: Gail Lester MRN: [id number] DOB/AGE: January 18, 1941 70 y.o.  Admit date: 12/08/2011 Discharge date: 12/09/2011  Admission Diagnoses:  Discharge Diagnoses:  Active Problems:  Distal radius fracture   Discharged Condition: good  Hospital Course: Pt tolerated orif of distal radius  Consults: none  Significant Diagnostic Studies: none  Treatments: surgery: ORIF left distal radius  Discharge Exam: Blood pressure 107/56, pulse 68, temperature 98.1 F (36.7 C), temperature source Oral, resp. rate 18, height 5\' 4"  (1.626 m), weight 74.844 kg (165 lb), SpO2 99.00%. see h/p  Disposition: To Home  Current Discharge Medication List    START taking these medications   Details  HYDROcodone-acetaminophen (NORCO) 5-325 MG per tablet Take 1-2 tablets by mouth every 6 (six) hours as needed for pain. Qty: 20 tablet, Refills: 0      CONTINUE these medications which have NOT CHANGED   Details  glimepiride (AMARYL) 4 MG tablet Take 1 mg by mouth daily before breakfast.      metFORMIN (GLUCOPHAGE) 500 MG tablet Take 1,000 mg by mouth 2 (two) times daily with a meal.      rosuvastatin (CRESTOR) 5 MG tablet Take 5 mg by mouth daily.         Follow-up Information    Follow up with [facility] in 14 days.   Contact information:   [facility] [address] [place] [address] [phone]       Follow up with Nadean Corwin, MD .         Signed: Sharma Covert 12/09/2011, 10:19 AM

## 2011-12-09 NOTE — Anesthesia Procedure Notes (Addendum)
Anesthesia Regional Block:  Supraclavicular block  Pre-Anesthetic Checklist: ,, timeout performed, Correct Patient, Correct Site, Correct Laterality, Correct Procedure, Correct Position, site marked, Risks and benefits discussed,  Surgical consent,  Pre-op evaluation,  At surgeon's request and post-op pain management  Laterality: Left  Prep: chloraprep       Needles:   Needle Type: Other   (Arrow Echogenic)   Needle Length: 9cm  Needle Gauge: 21    Additional Needles:  Procedures: ultrasound guided Supraclavicular block Narrative:  Start time: 12/09/2011 7:22 AM End time: 12/09/2011 7:35 AM Injection made incrementally with aspirations every 5 mL.  Performed by: Personally  Anesthesiologist: C Frederick  Additional Notes: Ultrasound guidance used to: id relevant anatomy, confirm needle position, local anesthetic spread, avoidance of vascular puncture. Picture saved. No complications. Block performed personally by Janetta Hora. Gelene Mink, MD    Supraclavicular block Performed by: Shirlyn Goltz, TOM    Procedure Name: LMA Insertion Date/Time: 12/09/2011 8:21 AM Performed by: Benedetto Goad Pre-anesthesia Checklist: Patient identified, Emergency Drugs available, Suction available, Patient being monitored and Timeout performed Patient Re-evaluated:Patient Re-evaluated prior to inductionOxygen Delivery Method: Circle System Utilized Preoxygenation: Pre-oxygenation with 100% oxygen Intubation Type: IV induction LMA: LMA with gastric port inserted LMA Size: 4.0 Number of attempts: 1 Placement Confirmation: positive ETCO2 and breath sounds checked- equal and bilateral Tube secured with: Tape Dental Injury: Teeth and Oropharynx as per pre-operative assessment

## 2011-12-09 NOTE — Anesthesia Postprocedure Evaluation (Signed)
  Anesthesia Post Note  Patient: Gail Lester  Procedure(s) Performed:  OPEN REDUCTION INTERNAL FIXATION (ORIF) WRIST FRACTURE  Anesthesia type: General  Patient location: PACU  Post pain: Pain level controlled  Post assessment: Patient's Cardiovascular Status Stable  Last Vitals:  Filed Vitals:   12/09/11 0954  BP:   Pulse:   Temp: 36.7 C  Resp:     Post vital signs: Reviewed and stable  Level of consciousness: sedated  Complications: No apparent anesthesia complications

## 2011-12-09 NOTE — Addendum Note (Signed)
Addendum  created 12/09/11 1205 by Benedetto Goad   Modules edited:Anesthesia Responsible Staff

## 2011-12-10 LAB — GLUCOSE, CAPILLARY: Glucose-Capillary: 126 mg/dL — ABNORMAL HIGH (ref 70–99)

## 2011-12-10 NOTE — Addendum Note (Signed)
Addendum  created 12/10/11 0851 by Benedetto Goad   Modules edited:Anesthesia Events

## 2011-12-11 ENCOUNTER — Encounter (HOSPITAL_COMMUNITY): Payer: Self-pay | Admitting: Orthopedic Surgery

## 2012-06-05 ENCOUNTER — Other Ambulatory Visit (HOSPITAL_COMMUNITY): Payer: Self-pay | Admitting: Internal Medicine

## 2012-06-05 ENCOUNTER — Other Ambulatory Visit (HOSPITAL_COMMUNITY)
Admission: RE | Admit: 2012-06-05 | Discharge: 2012-06-05 | Disposition: A | Discharge status: Home / Self Care | Payer: Medicare Other | Source: Ambulatory Visit | Attending: Internal Medicine | Admitting: Internal Medicine

## 2012-06-05 ENCOUNTER — Other Ambulatory Visit: Payer: Self-pay | Admitting: Emergency Medicine

## 2012-06-05 ENCOUNTER — Ambulatory Visit (HOSPITAL_COMMUNITY)
Admission: RE | Admit: 2012-06-05 | Discharge: 2012-06-05 | Disposition: A | Discharge status: Home / Self Care | Payer: Medicare Other | Source: Ambulatory Visit | Attending: Internal Medicine | Admitting: Internal Medicine

## 2012-06-05 DIAGNOSIS — R059 Cough, unspecified: Secondary | ICD-10-CM | POA: Insufficient documentation

## 2012-06-05 DIAGNOSIS — R05 Cough: Secondary | ICD-10-CM

## 2012-06-05 DIAGNOSIS — Z01419 Encounter for gynecological examination (general) (routine) without abnormal findings: Secondary | ICD-10-CM | POA: Insufficient documentation

## 2012-06-05 DIAGNOSIS — J9819 Other pulmonary collapse: Secondary | ICD-10-CM | POA: Insufficient documentation

## 2012-08-24 LAB — HM DIABETES FOOT EXAM: HM Diabetic Foot Exam: NORMAL

## 2012-11-17 ENCOUNTER — Ambulatory Visit (INDEPENDENT_AMBULATORY_CARE_PROVIDER_SITE_OTHER): Payer: Medicare Other | Admitting: Family Medicine

## 2012-11-17 ENCOUNTER — Encounter: Payer: Self-pay | Admitting: Family Medicine

## 2012-11-17 VITALS — BP 112/80 | HR 83 | Temp 98.2°F | Ht 64.0 in | Wt 175.0 lb

## 2012-11-17 DIAGNOSIS — E785 Hyperlipidemia, unspecified: Secondary | ICD-10-CM

## 2012-11-17 DIAGNOSIS — R197 Diarrhea, unspecified: Secondary | ICD-10-CM

## 2012-11-17 DIAGNOSIS — E119 Type 2 diabetes mellitus without complications: Secondary | ICD-10-CM

## 2012-11-17 DIAGNOSIS — E559 Vitamin D deficiency, unspecified: Secondary | ICD-10-CM

## 2012-11-17 NOTE — Patient Instructions (Addendum)
-  PLEASE SIGN UP FOR MYCHART TODAY   We recommend the following healthy lifestyle measures: - eat a healthy diet consisting of lots of vegetables, fruits, beans, nuts, seeds, healthy meats such as white chicken and fish and whole grains.  - avoid fried foods, fast food, processed foods, sodas, red meet and other fattening foods.  - get a least 150 minutes of aerobic exercise per week.   Please call me if diarrhea is recurrent in 1 week  Stop the Lipitor and restart your crestor, Stop the crioll oil  Follow up in: 1 month with me, schedule a lab only appointment next week for you fasting labs

## 2012-11-17 NOTE — Progress Notes (Signed)
Chief Complaint  Patient presents with  . Establish Care    HPI:  Gail Lester is here to establish care. PCP changed her specialty. Last PCP and physical: Marisue Brooklyn, PCP - had physical in June Work: retired, works part- time in [employer] Home Situation: alone Spiritual Beliefs: none  Has the following chronic problems and concerns today:  HLD: -on crestor, recently switched to Lipitor and she does not like this medication, wants to go back to crestor -went back to crestor and bowel improved significantly, one month -was also taking fish oil and bowels improved further  Diarrhea: -thinks related to her lipid medication -having liquid stools mixed with normal stools for 1 month, improving since going back to her normal cholesterol medicine -no travel, no recent abx -denies: weight loss, fevers, blood in stools, nausea, vomiting, chills, malaise  DM: -stable on oral meds -reports HgbA1c checked q3 months and usually around 7 or a little above or a little below  Other Providers:  Health Maintenance: -mammo yearly -colonoscopy 3 years ago and told needed another 10 years  ROS: See pertinent positives and negatives per HPI.  Past Medical History  Diagnosis Date  . Diabetes mellitus   . Hypercholesteremia   . Arthritis   . Diabetes     Family History  Problem Relation Age of Onset  . Colon cancer      grandparent  . Kidney disease Mother   . Diabetes Father     History   Social History  . Marital Status: Divorced    Spouse Name: N/A    Number of Children: N/A  . Years of Education: N/A   Social History Main Topics  . Smoking status: Former Smoker    Quit date: 11/17/1989  . Smokeless tobacco: None  . Alcohol Use: No  . Drug Use: No  . Sexually Active: No   Other Topics Concern  . None   Social History Narrative  . None    Current outpatient prescriptions:aspirin 81 MG tablet, Take 81 mg by mouth daily., Disp: , Rfl: ;  b complex vitamins  tablet, Take 1 tablet by mouth daily., Disp: , Rfl: ;  CALCIUM PO, Take 400 mg by mouth daily., Disp: , Rfl: ;  Cholecalciferol (D-3-5) 5000 UNITS capsule, Take 5,000 Units by mouth daily., Disp: , Rfl: ;  glimepiride (AMARYL) 4 MG tablet, Take 2 mg by mouth daily before breakfast. , Disp: , Rfl:  Lutein 20 MG CAPS, Take by mouth., Disp: , Rfl: ;  MAGNESIUM PO, Take 1,000 mg by mouth daily., Disp: , Rfl: ;  metFORMIN (GLUCOPHAGE) 500 MG tablet, Take 1,000 mg by mouth 2 (two) times daily with a meal. , Disp: , Rfl: ;  Multiple Vitamin (MULTIVITAMIN) tablet, Take 1 tablet by mouth daily., Disp: , Rfl: ;  rosuvastatin (CRESTOR) 20 MG tablet, Take 20 mg by mouth daily. Taking 5mg  alternating with 2.5mg , Disp: , Rfl:  vitamin C (VITAMIN C) 1000 MG tablet, Take 1 tablet (1,000 mg total) by mouth daily., Disp: 90 tablet, Rfl: 0  EXAM:  Filed Vitals:   11/17/12 1256  BP: 112/80  Pulse: 83  Temp: 98.2 F (36.8 C)    Body mass index is 30.04 kg/(m^2).  GENERAL: vitals reviewed and listed above, alert, oriented, appears well hydrated and in no acute distress  HEENT: atraumatic, conjunttiva clear, no obvious abnormalities on inspection of external nose and ears  NECK: no obvious masses on inspection  LUNGS: clear to auscultation bilaterally, no  wheezes, rales or rhonchi, good air movement  CV: HRRR, no peripheral edema  ABD: BS +, soft, NTTP  MS: moves all extremities without noticeable abnormality  PSYCH: pleasant and cooperative, no obvious depression or anxiety  ASSESSMENT AND PLAN:  Discussed the following assessment and plan:  1. Hyperlipemia  -will come back for fasting labs -DC lipitor due to ? adverse effects -restarted crestor   2. Diabetes  -check HgbA1c -lifestyle recs Hemoglobin A1c  3. Diarrhea  -seems to be improving, and likley med related -offered workup today and discussed etiologies - pt prefers to hold off on any furhter workup now -she will call if worsening or  not improving over next week Lipid Panel, CMP  4. Vitamin D deficiency  -checking Vit D Vitamin D, 25-hydroxy   -We reviewed the PMH, PSH, FH, SH, Meds and Allergies. -We provided refills for any medications we will prescribe as needed. -We addressed current concerns per orders and patient instructions. -We have asked for records for pertinent exams, studies, vaccines and notes from previous providers. -We have advised patient to follow up per instructions below.   -Patient advised to return or notify a doctor immediately if symptoms worsen or persist or new concerns arise.  Patient Instructions  -We have ordered labs or studies at this visit. It can take up to 1-2 weeks for results and processing. We will contact you with instructions IF your results are abnormal. Normal results will be released to your [facility]. If you have not heard from [place] or can not find your results in [facility] in 2 weeks please contact our office.  -PLEASE SIGN UP FOR MYCHART TODAY   We recommend the following healthy lifestyle measures: - eat a healthy diet consisting of lots of vegetables, fruits, beans, nuts, seeds, healthy meats such as white chicken and fish and whole grains.  - avoid fried foods, fast food, processed foods, sodas, red meet and other fattening foods.  - get a least 150 minutes of aerobic exercise per week.   Follow up in:      Kriste Basque R.

## 2012-11-24 ENCOUNTER — Telehealth: Payer: Self-pay | Admitting: Family Medicine

## 2012-11-24 ENCOUNTER — Other Ambulatory Visit (INDEPENDENT_AMBULATORY_CARE_PROVIDER_SITE_OTHER): Payer: Medicare Other

## 2012-11-24 DIAGNOSIS — E119 Type 2 diabetes mellitus without complications: Secondary | ICD-10-CM

## 2012-11-24 DIAGNOSIS — R197 Diarrhea, unspecified: Secondary | ICD-10-CM

## 2012-11-24 DIAGNOSIS — E559 Vitamin D deficiency, unspecified: Secondary | ICD-10-CM

## 2012-11-24 DIAGNOSIS — E785 Hyperlipidemia, unspecified: Secondary | ICD-10-CM

## 2012-11-24 LAB — LIPID PANEL
Cholesterol: 182 mg/dL (ref 0–200)
Total CHOL/HDL Ratio: 4

## 2012-11-24 LAB — LDL CHOLESTEROL, DIRECT: Direct LDL: 109.7 mg/dL

## 2012-11-24 LAB — COMPREHENSIVE METABOLIC PANEL
AST: 32 U/L (ref 0–37)
Albumin: 3.9 g/dL (ref 3.5–5.2)
BUN: 17 mg/dL (ref 6–23)
Calcium: 9 mg/dL (ref 8.4–10.5)
Chloride: 104 mEq/L (ref 96–112)
Potassium: 4.2 mEq/L (ref 3.5–5.1)

## 2012-11-24 NOTE — Telephone Encounter (Signed)
Please let her know:  Her diabetes lab shows her diabetes is not quite controlled and her cholesterol is a little high. The best treatment is regular exercise and a healthy diet. We will medication changes at her upcoming appointment.

## 2012-11-25 ENCOUNTER — Encounter: Payer: Self-pay | Admitting: Family Medicine

## 2012-11-26 LAB — HM DIABETES EYE EXAM

## 2012-11-26 NOTE — Telephone Encounter (Signed)
Called and spoke with pt and pt is aware.  

## 2012-12-12 ENCOUNTER — Encounter: Payer: Self-pay | Admitting: Family Medicine

## 2012-12-25 ENCOUNTER — Encounter: Payer: Self-pay | Admitting: Family Medicine

## 2012-12-25 ENCOUNTER — Ambulatory Visit (INDEPENDENT_AMBULATORY_CARE_PROVIDER_SITE_OTHER): Payer: Medicare Other | Admitting: Family Medicine

## 2012-12-25 VITALS — BP 130/76 | HR 84 | Temp 98.2°F | Wt 178.0 lb

## 2012-12-25 DIAGNOSIS — R197 Diarrhea, unspecified: Secondary | ICD-10-CM

## 2012-12-25 DIAGNOSIS — E785 Hyperlipidemia, unspecified: Secondary | ICD-10-CM

## 2012-12-25 DIAGNOSIS — E119 Type 2 diabetes mellitus without complications: Secondary | ICD-10-CM

## 2012-12-25 MED ORDER — ROSUVASTATIN CALCIUM 5 MG PO TABS: 5.0000 mg | ORAL_TABLET | Freq: Every day | ORAL | Status: DC

## 2012-12-25 NOTE — Patient Instructions (Addendum)
-  continue to work on a healthy diet and regular daily exercise  -continue current medications  -follow up in March for an early appointment and we will check labs then

## 2012-12-25 NOTE — Progress Notes (Signed)
Chief Complaint  Patient presents with  . 1 month follow up    HPI:  Follow Up: Initial visit 11/17/12  DM/HLD: -HgbA1c 7.4, LDL 109, CMP ok 11/24/12 -takes metformin 1000mg  bid, amaryl 2 mg daily, crestor 5 mg daily (up from 2.5 mg daily)and ASA -denies: fevers, polyuria, polydipsia, foot lesions -last eye exam: before christmas, mild cataracts -Pt thinks diabetes is probably better now - had not been sticking to diet - now back to good diet, and she his walking twice per day 1/2 to 1 mile -BS: at home have improved all under 115 -would rather work on her own then with a nutritionist  Diarrhea: -pt thought last visit result of lipitor and was improved -now resolved  ROS: See pertinent positives and negatives per HPI.  Past Medical History  Diagnosis Date  . Diabetes mellitus   . Hypercholesteremia   . Arthritis   . Diabetes     Family History  Problem Relation Age of Onset  . Colon cancer      grandparent  . Kidney disease Mother   . Diabetes Father     History   Social History  . Marital Status: Divorced    Spouse Name: N/A    Number of Children: N/A  . Years of Education: N/A   Social History Main Topics  . Smoking status: Former Smoker    Quit date: 11/17/1989  . Smokeless tobacco: None  . Alcohol Use: No  . Drug Use: No  . Sexually Active: No   Other Topics Concern  . None   Social History Narrative  . None    Current outpatient prescriptions:aspirin 81 MG tablet, Take 81 mg by mouth daily., Disp: , Rfl: ;  b complex vitamins tablet, Take 1 tablet by mouth daily., Disp: , Rfl: ;  CALCIUM PO, Take 400 mg by mouth daily., Disp: , Rfl: ;  Cholecalciferol (D-3-5) 5000 UNITS capsule, Take 5,000 Units by mouth daily., Disp: , Rfl: ;  glimepiride (AMARYL) 4 MG tablet, Take 2 mg by mouth daily before breakfast. , Disp: , Rfl:  Lutein 20 MG CAPS, Take by mouth., Disp: , Rfl: ;  MAGNESIUM PO, Take 1,000 mg by mouth daily., Disp: , Rfl: ;  metFORMIN  (GLUCOPHAGE) 500 MG tablet, Take 1,000 mg by mouth 2 (two) times daily with a meal. , Disp: , Rfl: ;  Multiple Vitamin (MULTIVITAMIN) tablet, Take 1 tablet by mouth daily., Disp: , Rfl: ;  rosuvastatin (CRESTOR) 5 MG tablet, Take 1 tablet (5 mg total) by mouth daily. Taking 5mg  alternating with 2.5mg , Disp: 90 tablet, Rfl: 3  EXAM:  Filed Vitals:   12/25/12 0838  BP: 130/76  Pulse: 84  Temp: 98.2 F (36.8 C)    There is no height on file to calculate BMI.  GENERAL: vitals reviewed and listed above, alert, oriented, appears well hydrated and in no acute distress  HEENT: atraumatic, conjunttiva clear, no obvious abnormalities on inspection of external nose and ears  MS: moves all extremities without noticeable abnormality  PSYCH: pleasant and cooperative, no obvious depression or anxiety  ASSESSMENT AND PLAN:  Discussed the following assessment and plan:  1. Hyperlipemia  rosuvastatin (CRESTOR) 5 MG tablet  2. Diabetes    3. Diarrhea  resolved   -pt will do lifestyle changes and follow up closely -Patient advised to return or notify a doctor immediately if symptoms worsen or persist or new concerns arise.  Patient Instructions  -continue to work on a healthy diet and  regular daily exercise  -continue current medications  -follow up in March for an early appointment and we will check labs then     Kriste Basque R.

## 2013-01-01 ENCOUNTER — Encounter: Payer: Self-pay | Admitting: Family Medicine

## 2013-02-10 ENCOUNTER — Encounter: Payer: Self-pay | Admitting: Family Medicine

## 2013-02-10 ENCOUNTER — Ambulatory Visit (INDEPENDENT_AMBULATORY_CARE_PROVIDER_SITE_OTHER): Payer: Medicare Other | Admitting: Family Medicine

## 2013-02-10 VITALS — BP 112/62 | HR 82 | Temp 98.3°F | Ht 64.0 in | Wt 169.0 lb

## 2013-02-10 DIAGNOSIS — E11649 Type 2 diabetes mellitus with hypoglycemia without coma: Secondary | ICD-10-CM

## 2013-02-10 DIAGNOSIS — R197 Diarrhea, unspecified: Secondary | ICD-10-CM

## 2013-02-10 DIAGNOSIS — E785 Hyperlipidemia, unspecified: Secondary | ICD-10-CM

## 2013-02-10 DIAGNOSIS — E669 Obesity, unspecified: Secondary | ICD-10-CM | POA: Insufficient documentation

## 2013-02-10 DIAGNOSIS — E1169 Type 2 diabetes mellitus with other specified complication: Secondary | ICD-10-CM | POA: Insufficient documentation

## 2013-02-10 DIAGNOSIS — E119 Type 2 diabetes mellitus without complications: Secondary | ICD-10-CM

## 2013-02-10 DIAGNOSIS — N6019 Diffuse cystic mastopathy of unspecified breast: Secondary | ICD-10-CM

## 2013-02-10 DIAGNOSIS — M720 Palmar fascial fibromatosis [Dupuytren]: Secondary | ICD-10-CM

## 2013-02-10 DIAGNOSIS — E782 Mixed hyperlipidemia: Secondary | ICD-10-CM | POA: Insufficient documentation

## 2013-02-10 HISTORY — DX: Mixed hyperlipidemia: E78.2

## 2013-02-10 HISTORY — DX: Type 2 diabetes mellitus with hypoglycemia without coma: E11.649

## 2013-02-10 HISTORY — DX: Diarrhea, unspecified: R19.7

## 2013-02-10 HISTORY — DX: Palmar fascial fibromatosis (dupuytren): M72.0

## 2013-02-10 HISTORY — DX: Type 2 diabetes mellitus without complications: E11.9

## 2013-02-10 HISTORY — DX: Diffuse cystic mastopathy of unspecified breast: N60.19

## 2013-02-10 MED ORDER — ROSUVASTATIN CALCIUM 5 MG PO TABS: 5.0000 mg | ORAL_TABLET | Freq: Every day | ORAL | Status: DC

## 2013-02-10 NOTE — Assessment & Plan Note (Signed)
Resolved at this time, encouraged fiber and probiotics, report return of symptoms. Sounds as if it started with an acute viral illness back in November. Stopped multiple meds can restart all but vit c and crestor now. Restart crestor next week and then vitamin c last

## 2013-02-10 NOTE — Assessment & Plan Note (Addendum)
Tolerating crestor, avoid trans fats, add Krill oil caps

## 2013-02-10 NOTE — Patient Instructions (Addendum)
Rel of rec Dr Haydee Monica  Labs prior to visit, lipid, renal, cbc, tsh, hepatic and hgba1c, microalb  Annual in 6-7 with same labs prior   Preventive Care for Adults, Female A healthy lifestyle and preventive care can promote health and wellness. Preventive health guidelines for women include the following key practices.  A routine yearly physical is a good way to check with your caregiver about your health and preventive screening. It is a chance to share any concerns and updates on your health, and to receive a thorough exam.  Visit your dentist for a routine exam and preventive care every 6 months. Brush your teeth twice a day and floss once a day. Good oral hygiene prevents tooth decay and gum disease.  The frequency of eye exams is based on your age, health, family medical history, use of contact lenses, and other factors. Follow your caregiver's recommendations for frequency of eye exams.  Eat a healthy diet. Foods like vegetables, fruits, whole grains, low-fat dairy products, and lean protein foods contain the nutrients you need without too many calories. Decrease your intake of foods high in solid fats, added sugars, and salt. Eat the right amount of calories for you.Get information about a proper diet from your caregiver, if necessary.  Regular physical exercise is one of the most important things you can do for your health. Most adults should get at least 150 minutes of moderate-intensity exercise (any activity that increases your heart rate and causes you to sweat) each week. In addition, most adults need muscle-strengthening exercises on 2 or more days a week.  Maintain a healthy weight. The body mass index (BMI) is a screening tool to identify possible weight problems. It provides an estimate of body fat based on height and weight. Your caregiver can help determine your BMI, and can help you achieve or maintain a healthy weight.For adults 20 years and older:  A BMI below  18.5 is considered underweight.  A BMI of 18.5 to 24.9 is normal.  A BMI of 25 to 29.9 is considered overweight.  A BMI of 30 and above is considered obese.  Maintain normal blood lipids and cholesterol levels by exercising and minimizing your intake of saturated fat. Eat a balanced diet with plenty of fruit and vegetables. Blood tests for lipids and cholesterol should begin at age 8 and be repeated every 5 years. If your lipid or cholesterol levels are high, you are over 50, or you are at high risk for heart disease, you may need your cholesterol levels checked more frequently.Ongoing high lipid and cholesterol levels should be treated with medicines if diet and exercise are not effective.  If you smoke, find out from your caregiver how to quit. If you do not use tobacco, do not start.  If you are pregnant, do not drink alcohol. If you are breastfeeding, be very cautious about drinking alcohol. If you are not pregnant and choose to drink alcohol, do not exceed 1 drink per day. One drink is considered to be 12 ounces (355 mL) of beer, 5 ounces (148 mL) of wine, or 1.5 ounces (44 mL) of liquor.  Avoid use of street drugs. Do not share needles with anyone. Ask for help if you need support or instructions about stopping the use of drugs.  High blood pressure causes heart disease and increases the risk of stroke. Your blood pressure should be checked at least every 1 to 2 years. Ongoing high blood pressure should be treated with medicines if  weight loss and exercise are not effective.  If you are 61 to 72 years old, ask your caregiver if you should take aspirin to prevent strokes.  Diabetes screening involves taking a blood sample to check your fasting blood sugar level. This should be done once every 3 years, after age 19, if you are within normal weight and without risk factors for diabetes. Testing should be considered at a younger age or be carried out more frequently if you are overweight and  have at least 1 risk factor for diabetes.  Breast cancer screening is essential preventive care for women. You should practice "breast self-awareness." This means understanding the normal appearance and feel of your breasts and may include breast self-examination. Any changes detected, no matter how small, should be reported to a caregiver. Women in their 47s and 30s should have a clinical breast exam (CBE) by a caregiver as part of a regular health exam every 1 to 3 years. After age 15, women should have a CBE every year. Starting at age 78, women should consider having a mammography (breast X-ray test) every year. Women who have a family history of breast cancer should talk to their caregiver about genetic screening. Women at a high risk of breast cancer should talk to their caregivers about having magnetic resonance imaging (MRI) and a mammography every year.  The Pap test is a screening test for cervical cancer. A Pap test can show cell changes on the cervix that might become cervical cancer if left untreated. A Pap test is a procedure in which cells are obtained and examined from the lower end of the uterus (cervix).  Women should have a Pap test starting at age 3.  Between ages 67 and 71, Pap tests should be repeated every 2 years.  Beginning at age 49, you should have a Pap test every 3 years as long as the past 3 Pap tests have been normal.  Some women have medical problems that increase the chance of getting cervical cancer. Talk to your caregiver about these problems. It is especially important to talk to your caregiver if a new problem develops soon after your last Pap test. In these cases, your caregiver may recommend more frequent screening and Pap tests.  The above recommendations are the same for women who have or have not gotten the vaccine for human papillomavirus (HPV).  If you had a hysterectomy for a problem that was not cancer or a condition that could lead to cancer, then you  no longer need Pap tests. Even if you no longer need a Pap test, a regular exam is a good idea to make sure no other problems are starting.  If you are between ages 11 and 47, and you have had normal Pap tests going back 10 years, you no longer need Pap tests. Even if you no longer need a Pap test, a regular exam is a good idea to make sure no other problems are starting.  If you have had past treatment for cervical cancer or a condition that could lead to cancer, you need Pap tests and screening for cancer for at least 20 years after your treatment.  If Pap tests have been discontinued, risk factors (such as a new sexual partner) need to be reassessed to determine if screening should be resumed.  The HPV test is an additional test that may be used for cervical cancer screening. The HPV test looks for the virus that can cause the cell changes on the  cervix. The cells collected during the Pap test can be tested for HPV. The HPV test could be used to screen women aged 23 years and older, and should be used in women of any age who have unclear Pap test results. After the age of 41, women should have HPV testing at the same frequency as a Pap test.  Colorectal cancer can be detected and often prevented. Most routine colorectal cancer screening begins at the age of 81 and continues through age 77. However, your caregiver may recommend screening at an earlier age if you have risk factors for colon cancer. On a yearly basis, your caregiver may provide home test kits to check for hidden blood in the stool. Use of a small camera at the end of a tube, to directly examine the colon (sigmoidoscopy or colonoscopy), can detect the earliest forms of colorectal cancer. Talk to your caregiver about this at age 74, when routine screening begins. Direct examination of the colon should be repeated every 5 to 10 years through age 71, unless early forms of pre-cancerous polyps or small growths are found.  Hepatitis C blood  testing is recommended for all people born from [birth year] through 1965 and any individual with known risks for hepatitis C.  Practice safe sex. Use condoms and avoid high-risk sexual practices to reduce the spread of sexually transmitted infections (STIs). STIs include gonorrhea, chlamydia, syphilis, trichomonas, herpes, HPV, and human immunodeficiency virus (HIV). Herpes, HIV, and HPV are viral illnesses that have no cure. They can result in disability, cancer, and death. Sexually active women aged 65 and younger should be checked for chlamydia. Older women with new or multiple partners should also be tested for chlamydia. Testing for other STIs is recommended if you are sexually active and at increased risk.  Osteoporosis is a disease in which the bones lose minerals and strength with aging. This can result in serious bone fractures. The risk of osteoporosis can be identified using a bone density scan. Women ages 62 and over and women at risk for fractures or osteoporosis should discuss screening with their caregivers. Ask your caregiver whether you should take a calcium supplement or vitamin D to reduce the rate of osteoporosis.  Menopause can be associated with physical symptoms and risks. Hormone replacement therapy is available to decrease symptoms and risks. You should talk to your caregiver about whether hormone replacement therapy is right for you.  Use sunscreen with sun protection factor (SPF) of 30 or more. Apply sunscreen liberally and repeatedly throughout the day. You should seek shade when your shadow is shorter than you. Protect yourself by wearing long sleeves, pants, a wide-brimmed hat, and sunglasses year round, whenever you are outdoors.  Once a month, do a whole body skin exam, using a mirror to look at the skin on your back. Notify your caregiver of new moles, moles that have irregular borders, moles that are larger than a pencil eraser, or moles that have changed in shape or  color.  Stay current with required immunizations.  Influenza. You need a dose every fall (or winter). The composition of the flu vaccine changes each year, so being vaccinated once is not enough.  Pneumococcal polysaccharide. You need 1 to 2 doses if you smoke cigarettes or if you have certain chronic medical conditions. You need 1 dose at age 52 (or older) if you have never been vaccinated.  Tetanus, diphtheria, pertussis (Tdap, Td). Get 1 dose of Tdap vaccine if you are younger than age 61, are  over 65 and have contact with an infant, are a Research scientist (physical sciences), are pregnant, or simply want to be protected from whooping cough. After that, you need a Td booster dose every 10 years. Consult your caregiver if you have not had at least 3 tetanus and diphtheria-containing shots sometime in your life or have a deep or dirty wound.  HPV. You need this vaccine if you are a woman age 60 or younger. The vaccine is given in 3 doses over 6 months.  Measles, mumps, rubella (MMR). You need at least 1 dose of MMR if you were born in 1957 or later. You may also need a second dose.  Meningococcal. If you are age 65 to 60 and a first-year college student living in a residence hall, or have one of several medical conditions, you need to get vaccinated against meningococcal disease. You may also need additional booster doses.  Zoster (shingles). If you are age 52 or older, you should get this vaccine.  Varicella (chickenpox). If you have never had chickenpox or you were vaccinated but received only 1 dose, talk to your caregiver to find out if you need this vaccine.  Hepatitis A. You need this vaccine if you have a specific risk factor for hepatitis A virus infection or you simply wish to be protected from this disease. The vaccine is usually given as 2 doses, 6 to 18 months apart.  Hepatitis B. You need this vaccine if you have a specific risk factor for hepatitis B virus infection or you simply wish to be  protected from this disease. The vaccine is given in 3 doses, usually over 6 months. Preventive Services / Frequency Ages 15 to 20  Blood pressure check.** / Every 1 to 2 years.  Lipid and cholesterol check.** / Every 5 years beginning at age 46.  Clinical breast exam.** / Every 3 years for women in their 13s and 30s.  Pap test.** / Every 2 years from ages 47 through 62. Every 3 years starting at age 106 through age 9 or 62 with a history of 3 consecutive normal Pap tests.  HPV screening.** / Every 3 years from ages 24 through ages 77 to 21 with a history of 3 consecutive normal Pap tests.  Hepatitis C blood test.** / For any individual with known risks for hepatitis C.  Skin self-exam. / Monthly.  Influenza immunization.** / Every year.  Pneumococcal polysaccharide immunization.** / 1 to 2 doses if you smoke cigarettes or if you have certain chronic medical conditions.  Tetanus, diphtheria, pertussis (Tdap, Td) immunization. / A one-time dose of Tdap vaccine. After that, you need a Td booster dose every 10 years.  HPV immunization. / 3 doses over 6 months, if you are 28 and younger.  Measles, mumps, rubella (MMR) immunization. / You need at least 1 dose of MMR if you were born in 1957 or later. You may also need a second dose.  Meningococcal immunization. / 1 dose if you are age 83 to 56 and a first-year college student living in a residence hall, or have one of several medical conditions, you need to get vaccinated against meningococcal disease. You may also need additional booster doses.  Varicella immunization.** / Consult your caregiver.  Hepatitis A immunization.** / Consult your caregiver. 2 doses, 6 to 18 months apart.  Hepatitis B immunization.** / Consult your caregiver. 3 doses usually over 6 months. Ages 60 to 30  Blood pressure check.** / Every 1 to 2 years.  Lipid and  cholesterol check.** / Every 5 years beginning at age 69.  Clinical breast exam.** / Every year  after age 31.  Mammogram.** / Every year beginning at age 22 and continuing for as long as you are in good health. Consult with your caregiver.  Pap test.** / Every 3 years starting at age 63 through age 47 or 15 with a history of 3 consecutive normal Pap tests.  HPV screening.** / Every 3 years from ages 56 through ages 48 to 57 with a history of 3 consecutive normal Pap tests.  Fecal occult blood test (FOBT) of stool. / Every year beginning at age 60 and continuing until age 68. You may not need to do this test if you get a colonoscopy every 10 years.  Flexible sigmoidoscopy or colonoscopy.** / Every 5 years for a flexible sigmoidoscopy or every 10 years for a colonoscopy beginning at age 1 and continuing until age 56.  Hepatitis C blood test.** / For all people born from [birth year] through 1965 and any individual with known risks for hepatitis C.  Skin self-exam. / Monthly.  Influenza immunization.** / Every year.  Pneumococcal polysaccharide immunization.** / 1 to 2 doses if you smoke cigarettes or if you have certain chronic medical conditions.  Tetanus, diphtheria, pertussis (Tdap, Td) immunization.** / A one-time dose of Tdap vaccine. After that, you need a Td booster dose every 10 years.  Measles, mumps, rubella (MMR) immunization. / You need at least 1 dose of MMR if you were born in 1957 or later. You may also need a second dose.  Varicella immunization.** / Consult your caregiver.  Meningococcal immunization.** / Consult your caregiver.  Hepatitis A immunization.** / Consult your caregiver. 2 doses, 6 to 18 months apart.  Hepatitis B immunization.** / Consult your caregiver. 3 doses, usually over 6 months. Ages 51 and over  Blood pressure check.** / Every 1 to 2 years.  Lipid and cholesterol check.** / Every 5 years beginning at age 59.  Clinical breast exam.** / Every year after age 69.  Mammogram.** / Every year beginning at age 64 and continuing for as long as you are  in good health. Consult with your caregiver.  Pap test.** / Every 3 years starting at age 42 through age 57 or 71 with a 3 consecutive normal Pap tests. Testing can be stopped between 65 and 70 with 3 consecutive normal Pap tests and no abnormal Pap or HPV tests in the past 10 years.  HPV screening.** / Every 3 years from ages 94 through ages 47 or 65 with a history of 3 consecutive normal Pap tests. Testing can be stopped between 65 and 70 with 3 consecutive normal Pap tests and no abnormal Pap or HPV tests in the past 10 years.  Fecal occult blood test (FOBT) of stool. / Every year beginning at age 74 and continuing until age 38. You may not need to do this test if you get a colonoscopy every 10 years.  Flexible sigmoidoscopy or colonoscopy.** / Every 5 years for a flexible sigmoidoscopy or every 10 years for a colonoscopy beginning at age 47 and continuing until age 14.  Hepatitis C blood test.** / For all people born from [birth year] through 1965 and any individual with known risks for hepatitis C.  Osteoporosis screening.** / A one-time screening for women ages 71 and over and women at risk for fractures or osteoporosis.  Skin self-exam. / Monthly.  Influenza immunization.** / Every year.  Pneumococcal polysaccharide immunization.** / 1 dose  at age 34 (or older) if you have never been vaccinated.  Tetanus, diphtheria, pertussis (Tdap, Td) immunization. / A one-time dose of Tdap vaccine if you are over 65 and have contact with an infant, are a Research scientist (physical sciences), or simply want to be protected from whooping cough. After that, you need a Td booster dose every 10 years.  Varicella immunization.** / Consult your caregiver.  Meningococcal immunization.** / Consult your caregiver.  Hepatitis A immunization.** / Consult your caregiver. 2 doses, 6 to 18 months apart.  Hepatitis B immunization.** / Check with your caregiver. 3 doses, usually over 6 months. ** Family history and personal history of  risk and conditions may change your caregiver's recommendations. Document Released: 02/05/2002 Document Revised: 03/03/2012 Document Reviewed: 05/07/2011 [facility] Patient Information 2013 [place], [place].

## 2013-02-10 NOTE — Assessment & Plan Note (Signed)
Restart metformin and Glimeperide and avoid simple carbs.

## 2013-02-10 NOTE — Assessment & Plan Note (Signed)
Both hands, may try krill oil and add Aspercreme

## 2013-02-10 NOTE — Progress Notes (Signed)
Patient ID: Gail Lester, female   DOB: May 09, 1941, 72 y.o.   MRN: [id number] KHRISTA BRAUN [id number] 07-Jul-1941 02/10/2013      Progress Note New Patient  Subjective  Chief Complaint  Chief Complaint  Patient presents with  . Establish Care    new patient    HPI  Patient is a 72 year old Caucasian female who is in today to start last fall in November she realizes she had a viral illness and after that developed diarrhea. Appetite she had 5 episodes of loose stool daily. They have resolved now after she stopped all of her medications and let her bowels rest and no bloody or tarry stool. This am about daily. At the diarrhea is improved so that her symptoms. Overall fatigue and malaise are improved. No other acute illness. No fevers or chills. No chest pain or palpitations. No shortness of breath, GU complaints at this time  Past Medical History  Diagnosis Date  . Diabetes mellitus   . Hypercholesteremia   . Arthritis   . Diabetes   . Chicken pox as a child  . Measles as a child  . Mumps as a child  . Diarrhea 02/10/2013  . Other and unspecified hyperlipidemia 02/10/2013  . Fibrocystic breast 02/10/2013    Left h/o FN biopsy   . Dupuytren's contracture of both hands 02/10/2013    Past Surgical History  Procedure Laterality Date  . Appendectomy  1955  . Orif wrist fracture  12/09/2011    Procedure: OPEN REDUCTION INTERNAL FIXATION (ORIF) WRIST FRACTURE;  Surgeon: Sharma Covert;  Location: [facility] OR;  Service: Orthopedics;  Laterality: Left;  . Breast biopsy  1985-1990    benign    Family History  Problem Relation Age of Onset  . Colon cancer      grandparent  . Kidney disease Mother   . Diabetes Father   . Kidney disease Maternal Grandmother     History   Social History  . Marital Status: Divorced    Spouse Name: N/A    Number of Children: N/A  . Years of Education: N/A   Occupational History  . Not on file.   Social History Main Topics  . Smoking status: Former  Smoker    Quit date: 11/17/1989  . Smokeless tobacco: Not on file  . Alcohol Use: No  . Drug Use: No  . Sexually Active: No   Other Topics Concern  . Not on file   Social History Narrative  . No narrative on file    Current Outpatient Prescriptions on File Prior to Visit  Medication Sig Dispense Refill  . aspirin 81 MG tablet Take 81 mg by mouth daily.      Marland Kitchen b complex vitamins tablet Take 1 tablet by mouth daily.      Marland Kitchen CALCIUM PO Take 400 mg by mouth daily.      . Cholecalciferol (D-3-5) 5000 UNITS capsule Take 5,000 Units by mouth daily.      Marland Kitchen glimepiride (AMARYL) 4 MG tablet Take 2 mg by mouth daily before breakfast.       . Lutein 20 MG CAPS Take by mouth.      Marland Kitchen MAGNESIUM PO Take 1,000 mg by mouth daily.      . metFORMIN (GLUCOPHAGE) 500 MG tablet Take 1,000 mg by mouth 2 (two) times daily with a meal.       . Multiple Vitamin (MULTIVITAMIN) tablet Take 1 tablet by mouth daily.  No current facility-administered medications on file prior to visit.    No Known Allergies  Review of Systems  Review of Systems  Constitutional: Negative for fever, chills and malaise/fatigue.  HENT: Negative for hearing loss, nosebleeds and congestion.   Eyes: Negative for discharge.  Respiratory: Negative for cough, sputum production, shortness of breath and wheezing.   Cardiovascular: Negative for chest pain, palpitations and leg swelling.  Gastrointestinal: Negative for heartburn, nausea, vomiting, abdominal pain, diarrhea, constipation and blood in stool.  Genitourinary: Negative for dysuria, urgency, frequency and hematuria.  Musculoskeletal: Positive for joint pain. Negative for myalgias, back pain and falls.       B/l hand pain and contractures  Skin: Negative for rash.  Neurological: Negative for dizziness, tremors, sensory change, focal weakness, loss of consciousness, weakness and headaches.  Endo/Heme/Allergies: Negative for polydipsia. Does not bruise/bleed easily.   Psychiatric/Behavioral: Negative for depression and suicidal ideas. The patient is not nervous/anxious and does not have insomnia.     Objective  BP 112/62  Pulse 82  Temp(Src) 98.3 F (36.8 C) (Oral)  Ht 5\' 4"  (1.626 m)  Wt 169 lb 0.6 oz (76.676 kg)  BMI 29 kg/m2  SpO2 94%  Physical Exam  Physical Exam  Constitutional: She is oriented to person, place, and time and well-developed, well-nourished, and in no distress. No distress.  HENT:  Head: Normocephalic and atraumatic.  Right Ear: External ear normal.  Left Ear: External ear normal.  Nose: Nose normal.  Mouth/Throat: Oropharynx is clear and moist. No oropharyngeal exudate.  Eyes: Conjunctivae are normal. Pupils are equal, round, and reactive to light. Right eye exhibits no discharge. Left eye exhibits no discharge. No scleral icterus.  Neck: Normal range of motion. Neck supple. No thyromegaly present.  Cardiovascular: Normal rate, regular rhythm, normal heart sounds and intact distal pulses.   No murmur heard. Pulmonary/Chest: Effort normal and breath sounds normal. No respiratory distress. She has no wheezes. She has no rales.  Abdominal: Soft. Bowel sounds are normal. She exhibits no distension and no mass. There is no tenderness.  Musculoskeletal: Normal range of motion. She exhibits no edema and no tenderness.  Lymphadenopathy:    She has no cervical adenopathy.  Neurological: She is alert and oriented to person, place, and time. She has normal reflexes. No cranial nerve deficit. Coordination normal.  Skin: Skin is warm and dry. No rash noted. She is not diaphoretic.  Psychiatric: Mood, memory and affect normal.       Assessment & Plan  Dupuytren's contracture of both hands Both hands, may try krill oil and add Aspercreme  Other and unspecified hyperlipidemia Tolerating crestor, avoid trans fats, add Krill oil caps  Diarrhea Resolved at this time, encouraged fiber and probiotics, report return of symptoms.  Sounds as if it started with an acute viral illness back in November. Stopped multiple meds can restart all but vit c and crestor now. Restart crestor next week and then vitamin c last  Diabetes Restart metformin and Glimeperide and avoid simple carbs.

## 2013-02-16 ENCOUNTER — Telehealth: Payer: Self-pay | Admitting: Family Medicine

## 2013-02-16 MED ORDER — GLUCOSE BLOOD VI STRP: ORAL_STRIP | Status: DC

## 2013-02-16 NOTE — Telephone Encounter (Signed)
New prescription request  T/S accu-chk aviva plus.

## 2013-02-26 ENCOUNTER — Ambulatory Visit: Payer: Medicare Other | Admitting: Family Medicine

## 2013-04-28 ENCOUNTER — Telehealth: Payer: Self-pay

## 2013-04-28 DIAGNOSIS — Z0000 Encounter for general adult medical examination without abnormal findings: Secondary | ICD-10-CM

## 2013-04-28 DIAGNOSIS — E785 Hyperlipidemia, unspecified: Secondary | ICD-10-CM

## 2013-04-28 DIAGNOSIS — E119 Type 2 diabetes mellitus without complications: Secondary | ICD-10-CM

## 2013-04-28 LAB — HEMOGLOBIN A1C: Mean Plasma Glucose: 171 mg/dL — ABNORMAL HIGH (ref <117)

## 2013-04-28 LAB — RENAL FUNCTION PANEL
Albumin: 3.8 g/dL (ref 3.5–5.2)
BUN: 11 mg/dL (ref 6–23)
Calcium: 9 mg/dL (ref 8.4–10.5)
Creat: 0.6 mg/dL (ref 0.50–1.10)
Glucose, Bld: 74 mg/dL (ref 70–99)
Sodium: 140 mEq/L (ref 135–145)

## 2013-04-28 LAB — CBC
MCH: 29 pg (ref 26.0–34.0)
MCHC: 33.4 g/dL (ref 30.0–36.0)
Platelets: 275 10*3/uL (ref 150–400)
RDW: 13.7 % (ref 11.5–15.5)

## 2013-04-28 LAB — LIPID PANEL
Cholesterol: 158 mg/dL (ref 0–200)
HDL: 41 mg/dL (ref >39)
Total CHOL/HDL Ratio: 3.9 Ratio
Triglycerides: 248 mg/dL — ABNORMAL HIGH (ref <150)

## 2013-04-28 LAB — TSH: TSH: 3.123 u[IU]/mL (ref 0.350–4.500)

## 2013-04-28 LAB — HEPATIC FUNCTION PANEL
ALT: 18 U/L (ref 0–35)
Albumin: 3.8 g/dL (ref 3.5–5.2)
Total Protein: 6.3 g/dL (ref 6.0–8.3)

## 2013-04-28 NOTE — Telephone Encounter (Signed)
Labs order placed.

## 2013-04-29 LAB — MICROALBUMIN, URINE: Microalb, Ur: 1.18 mg/dL (ref 0.00–1.89)

## 2013-05-12 ENCOUNTER — Encounter: Payer: Self-pay | Admitting: Family Medicine

## 2013-05-12 ENCOUNTER — Ambulatory Visit (INDEPENDENT_AMBULATORY_CARE_PROVIDER_SITE_OTHER): Payer: Medicare Other | Admitting: Family Medicine

## 2013-05-12 VITALS — BP 118/64 | HR 81 | Temp 98.4°F | Ht 64.0 in | Wt 174.0 lb

## 2013-05-12 DIAGNOSIS — E785 Hyperlipidemia, unspecified: Secondary | ICD-10-CM

## 2013-05-12 DIAGNOSIS — M720 Palmar fascial fibromatosis [Dupuytren]: Secondary | ICD-10-CM

## 2013-05-12 DIAGNOSIS — R197 Diarrhea, unspecified: Secondary | ICD-10-CM

## 2013-05-12 DIAGNOSIS — E119 Type 2 diabetes mellitus without complications: Secondary | ICD-10-CM

## 2013-05-12 NOTE — Progress Notes (Signed)
Patient ID: Gail Lester, female   DOB: 03-23-1941, 72 y.o.   MRN: [id number] MIZUKI HOEL [id number] 1941-04-18 05/12/2013      Progress Note-Follow Up  Subjective  Chief Complaint  Chief Complaint  Patient presents with  . Follow-up    3 month    HPI  Patient is a 72 year old female is in today for followup. She reports after starting a probiotic in stopping all of her other over-the-counter supplements her diarrhea resolved. No abdominal pain, nausea, vomiting, anorexia is noted. No fevers or chills. She does note that Dupuytren's contractures and pain in her and has improved with Aspercreme. She does still continue to get leg cramps intermittently. She continues to struggle some shoulder pain but notes that her medications can be helpful. No chest pain, palpitations, shortness of breath, GI or GU complaints.  Past Medical History  Diagnosis Date  . Diabetes mellitus   . Hypercholesteremia   . Arthritis   . Diabetes   . Chicken pox as a child  . Measles as a child  . Mumps as a child  . Diarrhea 02/10/2013  . Other and unspecified hyperlipidemia 02/10/2013  . Fibrocystic breast 02/10/2013    Left h/o FN biopsy   . Dupuytren's contracture of both hands 02/10/2013    Past Surgical History  Procedure Laterality Date  . Appendectomy  1955  . Orif wrist fracture  12/09/2011    Procedure: OPEN REDUCTION INTERNAL FIXATION (ORIF) WRIST FRACTURE;  Surgeon: Sharma Covert;  Location: [facility] OR;  Service: Orthopedics;  Laterality: Left;  . Breast biopsy  1985-1990    benign    Family History  Problem Relation Age of Onset  . Colon cancer      grandparent  . Kidney disease Mother   . Diabetes Father   . Kidney disease Maternal Grandmother     History   Social History  . Marital Status: Divorced    Spouse Name: N/A    Number of Children: N/A  . Years of Education: N/A   Occupational History  . Not on file.   Social History Main Topics  . Smoking status: Former Smoker     Quit date: 11/17/1989  . Smokeless tobacco: Not on file  . Alcohol Use: No  . Drug Use: No  . Sexually Active: No   Other Topics Concern  . Not on file   Social History Narrative  . No narrative on file    Current Outpatient Prescriptions on File Prior to Visit  Medication Sig Dispense Refill  . aspirin 81 MG tablet Take 81 mg by mouth daily.      Marland Kitchen glimepiride (AMARYL) 4 MG tablet Take 2 mg by mouth daily before breakfast.       . glucose blood (ACCU-CHEK AVIVA PLUS) test strip Check Blood Sugar bid  300 each  12  . Lutein 20 MG CAPS Take by mouth.      . metFORMIN (GLUCOPHAGE) 500 MG tablet Take 1,000 mg by mouth 2 (two) times daily with a meal.       . rosuvastatin (CRESTOR) 5 MG tablet Take 1 tablet (5 mg total) by mouth at bedtime.  30 tablet  0  . b complex vitamins tablet Take 1 tablet by mouth daily.      Marland Kitchen CALCIUM PO Take 400 mg by mouth daily.      . Cholecalciferol (D-3-5) 5000 UNITS capsule Take 5,000 Units by mouth daily.      Marland Kitchen MAGNESIUM PO Take  1,000 mg by mouth daily.      . Multiple Vitamin (MULTIVITAMIN) tablet Take 1 tablet by mouth daily.      . vitamin C (ASCORBIC ACID) 500 MG tablet Take 500 mg by mouth 2 (two) times daily.       No current facility-administered medications on file prior to visit.    Allergies  Allergen Reactions  . Lipitor (Atorvastatin)     Diarrhea, muscle cramps    Review of Systems  Review of Systems  Constitutional: Negative for fever and malaise/fatigue.  HENT: Negative for congestion.   Eyes: Negative for discharge.  Respiratory: Negative for shortness of breath.   Cardiovascular: Negative for chest pain, palpitations and leg swelling.  Gastrointestinal: Negative for heartburn, nausea, vomiting, abdominal pain, diarrhea, constipation, blood in stool and melena.  Genitourinary: Negative for dysuria.  Musculoskeletal: Negative for falls.  Skin: Negative for rash.  Neurological: Negative for loss of consciousness and  headaches.  Endo/Heme/Allergies: Negative for polydipsia.  Psychiatric/Behavioral: Negative for depression and suicidal ideas. The patient is not nervous/anxious and does not have insomnia.     Objective  BP 118/64  Pulse 81  Temp(Src) 98.4 F (36.9 C) (Oral)  Ht 5\' 4"  (1.626 m)  Wt 174 lb 0.6 oz (78.944 kg)  BMI 29.86 kg/m2  SpO2 96%  Physical Exam  Physical Exam  Constitutional: She is oriented to person, place, and time and well-developed, well-nourished, and in no distress. No distress.  HENT:  Head: Normocephalic and atraumatic.  Eyes: Conjunctivae are normal.  Neck: Neck supple. No thyromegaly present.  Cardiovascular: Normal rate, regular rhythm and normal heart sounds.   Pulmonary/Chest: Effort normal and breath sounds normal. She has no wheezes.  Abdominal: She exhibits no distension and no mass.  Musculoskeletal: She exhibits no edema.  Lymphadenopathy:    She has no cervical adenopathy.  Neurological: She is alert and oriented to person, place, and time.  Skin: Skin is warm and dry. No rash noted. She is not diaphoretic.  Psychiatric: Memory, affect and judgment normal.    Lab Results  Component Value Date   TSH 3.123 04/28/2013   Lab Results  Component Value Date   WBC 6.1 04/28/2013   HGB 13.0 04/28/2013   HCT 38.9 04/28/2013   MCV 86.6 04/28/2013   PLT 275 04/28/2013   Lab Results  Component Value Date   CREATININE 0.60 04/28/2013   BUN 11 04/28/2013   NA 140 04/28/2013   K 4.3 04/28/2013   CL 105 04/28/2013   CO2 28 04/28/2013   Lab Results  Component Value Date   ALT 18 04/28/2013   AST 19 04/28/2013   ALKPHOS 54 04/28/2013   BILITOT 0.3 04/28/2013   Lab Results  Component Value Date   CHOL 158 04/28/2013   Lab Results  Component Value Date   HDL 41 04/28/2013   Lab Results  Component Value Date   LDLCALC 67 04/28/2013   Lab Results  Component Value Date   TRIG 248* 04/28/2013   Lab Results  Component Value Date   CHOLHDL 3.9 04/28/2013     Assessment &  Plan  Diarrhea Improved with probiotics and after stopping all other OTC medications.  Dupuytren's contracture of both hands Has found the Aspercreme helpful  Diabetes Sugars well controlled, minimal simple carbs. Continue current meds. Patient offered an increase in Metformin but would like to try to change diet and exercise more before changing meds recheck in 3 months  Other and unspecified hyperlipidemia Avoid  trans fats, minimize simple carbs. Continue Crestor

## 2013-05-12 NOTE — Patient Instructions (Addendum)
Krill oil/MegaRed caps Dig[employer]ive [employer]age probiotic Labs prior to next visit, vitamin d, magnesium, liver, renal, tsh, lipid, hgba1c, cbc  Ghrelin, Leptin hormones   Fatigue Fatigue is a feeling of tiredness, lack of energy, lack of motivation, or feeling tired all the time. Having enough rest, good nutrition, and reducing stress will normally reduce fatigue. Consult your caregiver if it persists. The nature of your fatigue will help your caregiver to find out its cause. The treatment is based on the cause.  CAUSES  There are many causes for fatigue. Most of the time, fatigue can be traced to one or more of your habits or routines. Most causes fit into one or more of three general areas. They are: Lifestyle problems  Sleep disturbances.  Overwork.  Physical exertion.  Unhealthy habits.  Poor eating habits or eating disorders.  Alcohol and/or drug use .  Lack of proper nutrition (malnutrition). Psychological problems  Stress and/or anxiety problems.  Depression.  Grief.  Boredom. Medical Problems or Conditions  Anemia.  Pregnancy.  Thyroid gland problems.  Recovery from major surgery.  Continuous pain.  Emphysema or asthma that is not well controlled  Allergic conditions.  Diabetes.  Infections (such as mononucleosis).  Obesity.  Sleep disorders, such as sleep apnea.  Heart failure or other heart-related problems.  Cancer.  Kidney disease.  Liver disease.  Effects of certain medicines such as antihistamines, cough and cold remedies, prescription pain medicines, heart and blood pressure medicines, drugs used for treatment of cancer, and some antidepressants. SYMPTOMS  The symptoms of fatigue include:   Lack of energy.  Lack of drive (motivation).  Drowsiness.  Feeling of indifference to the surroundings. DIAGNOSIS  The details of how you feel help guide your caregiver in finding out what is causing the fatigue. You will be asked about your  present and past health condition. It is important to review all medicines that you take, including prescription and non-prescription items. A thorough exam will be done. You will be questioned about your feelings, habits, and normal lifestyle. Your caregiver may suggest blood tests, urine tests, or other tests to look for common medical causes of fatigue.  TREATMENT  Fatigue is treated by correcting the underlying cause. For example, if you have continuous pain or depression, treating these causes will improve how you feel. Similarly, adjusting the dose of certain medicines will help in reducing fatigue.  HOME CARE INSTRUCTIONS   Try to get the required amount of good sleep every night.  Eat a healthy and nutritious diet, and drink enough water throughout the day.  Practice ways of relaxing (including yoga or meditation).  Exercise regularly.  Make plans to change situations that cause stress. Act on those plans so that stresses decrease over time. Keep your work and personal routine reasonable.  Avoid street drugs and minimize use of alcohol.  Start taking a daily multivitamin after consulting your caregiver. SEEK MEDICAL CARE IF:   You have persistent tiredness, which cannot be accounted for.  You have fever.  You have unintentional weight loss.  You have headaches.  You have disturbed sleep throughout the night.  You are feeling sad.  You have constipation.  You have dry skin.  You have gained weight.  You are taking any new or different medicines that you suspect are causing fatigue.  You are unable to sleep at night.  You develop any unusual swelling of your legs or other parts of your body. SEEK IMMEDIATE MEDICAL CARE IF:   You are  feeling confused.  Your vision is blurred.  You feel faint or pass out.  You develop severe headache.  You develop severe abdominal, pelvic, or back pain.  You develop chest pain, shortness of breath, or an irregular or fast  heartbeat.  You are unable to pass a normal amount of urine.  You develop abnormal bleeding such as bleeding from the rectum or you vomit blood.  You have thoughts about harming yourself or committing suicide.  You are worried that you might harm someone else. MAKE SURE YOU:   Understand these instructions.  Will watch your condition.  Will get help right away if you are not doing well or get worse. Document Released: 10/07/2007 Document Revised: 03/03/2012 Document Reviewed: 10/07/2007 [facility] Patient Information 2013 [place], [place].

## 2013-05-15 NOTE — Assessment & Plan Note (Signed)
Sugars well controlled, minimal simple carbs. Continue current meds. Patient offered an increase in Metformin but would like to try to change diet and exercise more before changing meds recheck in 3 months

## 2013-05-15 NOTE — Assessment & Plan Note (Signed)
Improved with probiotics and after stopping all other OTC medications.

## 2013-05-15 NOTE — Assessment & Plan Note (Signed)
Has found the Aspercreme helpful

## 2013-05-15 NOTE — Assessment & Plan Note (Addendum)
Avoid trans fats, minimize simple carbs. Continue Crestor

## 2013-06-08 ENCOUNTER — Encounter: Payer: Medicare Other | Admitting: Family Medicine

## 2013-07-28 ENCOUNTER — Telehealth: Payer: Self-pay

## 2013-07-28 DIAGNOSIS — E785 Hyperlipidemia, unspecified: Secondary | ICD-10-CM

## 2013-07-28 DIAGNOSIS — E119 Type 2 diabetes mellitus without complications: Secondary | ICD-10-CM

## 2013-07-28 DIAGNOSIS — Z0000 Encounter for general adult medical examination without abnormal findings: Secondary | ICD-10-CM

## 2013-07-28 LAB — RENAL FUNCTION PANEL
CO2: 27 mEq/L (ref 19–32)
Calcium: 9.6 mg/dL (ref 8.4–10.5)
Glucose, Bld: 113 mg/dL — ABNORMAL HIGH (ref 70–99)
Potassium: 4.3 mEq/L (ref 3.5–5.3)
Sodium: 140 mEq/L (ref 135–145)

## 2013-07-28 LAB — HEPATIC FUNCTION PANEL
Albumin: 3.9 g/dL (ref 3.5–5.2)
Alkaline Phosphatase: 52 U/L (ref 39–117)
Indirect Bilirubin: 0.2 mg/dL (ref 0.0–0.9)
Total Bilirubin: 0.3 mg/dL (ref 0.3–1.2)

## 2013-07-28 LAB — CBC
Hemoglobin: 13.1 g/dL (ref 12.0–15.0)
MCH: 28.8 pg (ref 26.0–34.0)
Platelets: 286 10*3/uL (ref 150–400)
RBC: 4.55 MIL/uL (ref 3.87–5.11)
WBC: 6 10*3/uL (ref 4.0–10.5)

## 2013-07-28 LAB — HEMOGLOBIN A1C: Mean Plasma Glucose: 157 mg/dL — ABNORMAL HIGH (ref <117)

## 2013-07-28 LAB — LIPID PANEL: LDL Cholesterol: 65 mg/dL (ref 0–99)

## 2013-07-28 NOTE — Telephone Encounter (Signed)
Lab order placed.

## 2013-07-29 LAB — MICROALBUMIN, URINE: Microalb, Ur: 0.92 mg/dL (ref 0.00–1.89)

## 2013-07-29 LAB — TSH: TSH: 3.026 u[IU]/mL (ref 0.350–4.500)

## 2013-08-04 ENCOUNTER — Ambulatory Visit (INDEPENDENT_AMBULATORY_CARE_PROVIDER_SITE_OTHER): Payer: Medicare Other | Admitting: Family Medicine

## 2013-08-04 ENCOUNTER — Encounter: Payer: Self-pay | Admitting: Family Medicine

## 2013-08-04 VITALS — BP 130/72 | HR 70 | Temp 98.2°F | Ht 64.0 in | Wt 174.0 lb

## 2013-08-04 DIAGNOSIS — E119 Type 2 diabetes mellitus without complications: Secondary | ICD-10-CM

## 2013-08-04 DIAGNOSIS — E785 Hyperlipidemia, unspecified: Secondary | ICD-10-CM

## 2013-08-04 DIAGNOSIS — R197 Diarrhea, unspecified: Secondary | ICD-10-CM

## 2013-08-04 DIAGNOSIS — R5381 Other malaise: Secondary | ICD-10-CM | POA: Insufficient documentation

## 2013-08-04 DIAGNOSIS — R5383 Other fatigue: Secondary | ICD-10-CM | POA: Insufficient documentation

## 2013-08-04 DIAGNOSIS — S52509S Unspecified fracture of the lower end of unspecified radius, sequela: Secondary | ICD-10-CM

## 2013-08-04 DIAGNOSIS — Z0000 Encounter for general adult medical examination without abnormal findings: Secondary | ICD-10-CM

## 2013-08-04 DIAGNOSIS — S42309S Unspecified fracture of shaft of humerus, unspecified arm, sequela: Secondary | ICD-10-CM

## 2013-08-04 MED ORDER — NIACIN ER 500 MG PO CPCR: 500.0000 mg | ORAL_CAPSULE | Freq: Every day | ORAL | Status: DC

## 2013-08-04 NOTE — Assessment & Plan Note (Signed)
Generally doing well. Stays active, eats a balanced heart healthy diet recommended DASH diet. Lives independently in her home, manages all of her self care well. Has no trouble paying her bills or forgetting things. He denies any depression. No hearing or vision changes recently. Follows annually with her ophthalmologist. Saw them in January 2014. Does not see any other physicians routinely

## 2013-08-04 NOTE — Progress Notes (Signed)
Patient ID: Gail Lester, female   DOB: 05/19/1941, 72 y.o.   MRN: [id number] Gail Lester [id number] 16-Jul-1941 08/04/2013      Progress Note-Follow Up  Subjective  Chief Complaint  Chief Complaint  Patient presents with  . Annual Exam    Medicare wellness    HPI  Patient is a 72 year old Caucasian female who is in today for annual wellness exam. Overall she reports doing better. Her loose stool has improved and she has eaten a clear diet and add a probiotic. Her dry itchy skin is responding to go to [employer] for diabetics. She continues to have dandruff and has not responded to products otc. Her biggest complaint today is of decreased energy or stamina. She reports easy fatigability with activity but her major complaint is not shortness of breath. She says this is possible but not her major feeling. She denies chest pain or palpitations. No recent illness. No fevers or chills. No GI or GU complaints at this time. Is taking medications as prescribed. No polyuria or polydipsia.  Past Medical History  Diagnosis Date  . Diabetes mellitus   . Hypercholesteremia   . Arthritis   . Diabetes   . Chicken pox as a child  . Measles as a child  . Mumps as a child  . Diarrhea 02/10/2013  . Other and unspecified hyperlipidemia 02/10/2013  . Fibrocystic breast 02/10/2013    Left h/o FN biopsy   . Dupuytren's contracture of both hands 02/10/2013  . Other malaise and fatigue 08/04/2013  . Encounter for Medicare annual wellness exam 08/04/2013    Past Surgical History  Procedure Laterality Date  . Appendectomy  1955  . Orif wrist fracture  12/09/2011    Procedure: OPEN REDUCTION INTERNAL FIXATION (ORIF) WRIST FRACTURE;  Surgeon: Sharma Covert;  Location: [facility] OR;  Service: Orthopedics;  Laterality: Left;  . Breast biopsy  1985-1990    benign    Family History  Problem Relation Age of Onset  . Colon cancer      grandparent  . Kidney disease Mother   . Diabetes Father   . Kidney disease  Maternal Grandmother   . Cancer Maternal Grandmother     bladder  . Cancer Maternal Aunt   . Alzheimer's disease Maternal Aunt     History   Social History  . Marital Status: Divorced    Spouse Name: N/A    Number of Children: N/A  . Years of Education: N/A   Occupational History  . Not on file.   Social History Main Topics  . Smoking status: Former Smoker    Quit date: 11/17/1989  . Smokeless tobacco: Not on file  . Alcohol Use: No  . Drug Use: No  . Sexually Active: No   Other Topics Concern  . Not on file   Social History Narrative  . No narrative on file    Current Outpatient Prescriptions on File Prior to Visit  Medication Sig Dispense Refill  . aspirin 81 MG tablet Take 81 mg by mouth daily.      Marland Kitchen b complex vitamins tablet Take 1 tablet by mouth as directed. Two times a week      . CALCIUM PO Take 400 mg by mouth daily.      . Cholecalciferol (D-3-5) 5000 UNITS capsule Take 5,000 Units by mouth daily.      Marland Kitchen glimepiride (AMARYL) 4 MG tablet Take 2 mg by mouth daily before breakfast.       .  glucose blood (ACCU-CHEK AVIVA PLUS) test strip Check Blood Sugar bid  300 each  12  . Lutein 20 MG CAPS Take by mouth.      Marland Kitchen MAGNESIUM PO Take 1,000 mg by mouth daily.      . metFORMIN (GLUCOPHAGE) 500 MG tablet Take 1,000 mg by mouth 2 (two) times daily with a meal.       . Multiple Vitamin (MULTIVITAMIN) tablet Take 1 tablet by mouth as directed. Two times a week      . rosuvastatin (CRESTOR) 5 MG tablet Take 1 tablet (5 mg total) by mouth at bedtime.  30 tablet  0  . vitamin C (ASCORBIC ACID) 500 MG tablet Take 500 mg by mouth 2 (two) times daily.       No current facility-administered medications on file prior to visit.    Allergies  Allergen Reactions  . Lipitor (Atorvastatin)     Diarrhea, muscle cramps    Review of Systems  Review of Systems  Constitutional: Positive for malaise/fatigue. Negative for fever.  HENT: Negative for congestion.   Eyes:  Negative for discharge.  Respiratory: Negative for shortness of breath.   Cardiovascular: Negative for chest pain, palpitations and leg swelling.  Gastrointestinal: Negative for nausea, abdominal pain and diarrhea.  Genitourinary: Negative for dysuria.  Musculoskeletal: Negative for falls.  Skin: Positive for itching. Negative for rash.  Neurological: Negative for loss of consciousness and headaches.  Endo/Heme/Allergies: Negative for polydipsia.  Psychiatric/Behavioral: Negative for depression and suicidal ideas. The patient is not nervous/anxious and does not have insomnia.     Objective  BP 130/72  Pulse 70  Temp(Src) 98.2 F (36.8 C) (Oral)  Ht 5\' 4"  (1.626 m)  Wt 174 lb (78.926 kg)  BMI 29.85 kg/m2  SpO2 96%  Physical Exam  Physical Exam  Constitutional: She is oriented to person, place, and time and well-developed, well-nourished, and in no distress. No distress.  HENT:  Head: Normocephalic and atraumatic.  Eyes: Conjunctivae are normal.  Neck: Neck supple. No thyromegaly present.  Cardiovascular: Normal rate and regular rhythm.  Exam reveals no gallop.   No murmur heard. Pulmonary/Chest: Effort normal and breath sounds normal. She has no wheezes.  Abdominal: She exhibits no distension and no mass.  Musculoskeletal: She exhibits no edema.  Foot exam, 2+ posterior tibial and dorsalis Pedis pulses. Sensation intact. Hair on great toes, skin intact and warm   Lymphadenopathy:    She has no cervical adenopathy.  Neurological: She is alert and oriented to person, place, and time.  Skin: Skin is warm and dry. No rash noted. She is not diaphoretic.  Psychiatric: Memory, affect and judgment normal.    Lab Results  Component Value Date   TSH 3.026 07/28/2013   Lab Results  Component Value Date   WBC 6.0 07/28/2013   HGB 13.1 07/28/2013   HCT 39.4 07/28/2013   MCV 86.6 07/28/2013   PLT 286 07/28/2013   Lab Results  Component Value Date   CREATININE 0.61 07/28/2013   BUN 11  07/28/2013   NA 140 07/28/2013   K 4.3 07/28/2013   CL 104 07/28/2013   CO2 27 07/28/2013   Lab Results  Component Value Date   ALT 28 07/28/2013   AST 27 07/28/2013   ALKPHOS 52 07/28/2013   BILITOT 0.3 07/28/2013   Lab Results  Component Value Date   CHOL 150 07/28/2013   Lab Results  Component Value Date   HDL 39* 07/28/2013   Lab Results  Component Value Date   LDLCALC 65 07/28/2013   Lab Results  Component Value Date   TRIG 228* 07/28/2013   Lab Results  Component Value Date   CHOLHDL 3.8 07/28/2013     Assessment & Plan  Diabetes Improved hgba1c to 7.1 with better diet has lost some weigh as well and feels better. Check an EKG today. No SOB, CP, palp.  Foot exam unremarkable today  Other and unspecified hyperlipidemia Tolerating Crestor. Avoid trans fats.  Distal radius fracture Stamina never quite the same since then.  Other malaise and fatigue Notes easier to fatigue than in past, DOE is trivial but present, with mild EKG changes, diabetes and h/o tobacco use in past will refer to cardiology for further consideration of stress testing for risk stratification  Encounter for Medicare annual wellness exam Generally doing well. Stays active, eats a balanced heart healthy diet recommended DASH diet. Lives independently in her home, manages all of her self care well. Has no trouble paying her bills or forgetting things. He denies any depression. No hearing or vision changes recently. Follows annually with her ophthalmologist. Saw them in January 2014. Does not see any other physicians routinely  Diarrhea Resolved with probiotics and cleaner diet.

## 2013-08-04 NOTE — Patient Instructions (Addendum)
Schedule that mammogram on the way out of the bldg    Diabetes and Exercise Regular exercise is important and can help:   Control blood glucose (sugar).  Decrease blood pressure.    Control blood lipids (cholesterol, triglycerides).  Improve overall health. BENEFITS FROM EXERCISE  Improved fitness.  Improved flexibility.  Improved endurance.  Increased bone density.  Weight control.  Increased muscle strength.  Decreased body fat.  Improvement of the body's use of insulin, a hormone.  Increased insulin sensitivity.  Reduction of insulin needs.  Reduced stress and tension.  Helps you feel better. People with diabetes who add exercise to their lifestyle gain additional benefits, including:  Weight loss.  Reduced appetite.  Improvement of the body's use of blood glucose.  Decreased risk factors for heart disease:  Lowering of cholesterol and triglycerides.  Raising the level of good cholesterol (high-density lipoproteins, HDL).  Lowering blood sugar.  Decreased blood pressure. TYPE 1 DIABETES AND EXERCISE  Exercise will usually lower your blood glucose.  If blood glucose is greater than 240 mg/dl, check urine ketones. If ketones are present, do not exercise.  Location of the insulin injection sites may need to be adjusted with exercise. Avoid injecting insulin into areas of the body that will be exercised. For example, avoid injecting insulin into:  The arms when playing tennis.  The legs when jogging. For more information, discuss this with your caregiver.  Keep a record of:  Food intake.  Type and amount of exercise.  Expected peak times of insulin action.  Blood glucose levels. Do this before, during, and after exercise. Review your records with your caregiver. This will help you to develop guidelines for adjusting food intake and insulin amounts.  TYPE 2 DIABETES AND EXERCISE  Regular physical activity can help control blood  glucose.  Exercise is important because it may:  Increase the body's sensitivity to insulin.  Improve blood glucose control.  Exercise reduces the risk of heart disease. It decreases serum cholesterol and triglycerides. It also lowers blood pressure.  Those who take insulin or oral hypoglycemic agents should watch for signs of hypoglycemia. These signs include dizziness, shaking, sweating, chills, and confusion.  Body water is lost during exercise. It must be replaced. This will help to avoid loss of body fluids (dehydration) or heat stroke. Be sure to talk to your caregiver before starting an exercise program to make sure it is safe for you. Remember, any activity is better than none.  Document Released: 03/01/2004 Document Revised: 03/03/2012 Document Reviewed: 06/16/2009 [facility] Patient Information 2014 [place], [place].

## 2013-08-04 NOTE — Assessment & Plan Note (Signed)
Tolerating Crestor. Avoid trans fats 

## 2013-08-04 NOTE — Assessment & Plan Note (Signed)
Notes easier to fatigue than in past, DOE is trivial but present, with mild EKG changes, diabetes and h/o tobacco use in past will refer to cardiology for further consideration of stress testing for risk stratification

## 2013-08-04 NOTE — Assessment & Plan Note (Signed)
Resolved with probiotics and cleaner diet.

## 2013-08-04 NOTE — Assessment & Plan Note (Signed)
Stamina never quite the same since then.

## 2013-08-04 NOTE — Assessment & Plan Note (Addendum)
Improved hgba1c to 7.1 with better diet has lost some weigh as well and feels better. Check an EKG today. No SOB, CP, palp.  Foot exam unremarkable today

## 2013-08-10 ENCOUNTER — Other Ambulatory Visit: Payer: Self-pay | Admitting: *Deleted

## 2013-08-10 MED ORDER — METFORMIN HCL 500 MG PO TABS: 1000.0000 mg | ORAL_TABLET | Freq: Two times a day (BID) | ORAL | Status: DC

## 2013-08-10 MED ORDER — GLIMEPIRIDE 4 MG PO TABS: 2.0000 mg | ORAL_TABLET | Freq: Every day | ORAL | Status: DC

## 2013-08-10 NOTE — Telephone Encounter (Signed)
Rx request to pharmacy/[facility]  

## 2013-09-02 ENCOUNTER — Ambulatory Visit (INDEPENDENT_AMBULATORY_CARE_PROVIDER_SITE_OTHER): Payer: Medicare Other

## 2013-09-02 ENCOUNTER — Other Ambulatory Visit: Payer: Self-pay | Admitting: Family Medicine

## 2013-09-02 DIAGNOSIS — Z1231 Encounter for screening mammogram for malignant neoplasm of breast: Secondary | ICD-10-CM

## 2013-09-02 DIAGNOSIS — Z23 Encounter for immunization: Secondary | ICD-10-CM

## 2013-09-04 ENCOUNTER — Ambulatory Visit (HOSPITAL_BASED_OUTPATIENT_CLINIC_OR_DEPARTMENT_OTHER)
Admission: RE | Admit: 2013-09-04 | Discharge: 2013-09-04 | Disposition: A | Discharge status: Home / Self Care | Payer: Medicare Other | Source: Ambulatory Visit | Attending: Family Medicine | Admitting: Family Medicine

## 2013-09-04 DIAGNOSIS — Z1231 Encounter for screening mammogram for malignant neoplasm of breast: Secondary | ICD-10-CM

## 2013-11-16 LAB — HM DIABETES EYE EXAM

## 2013-11-24 ENCOUNTER — Encounter: Payer: Self-pay | Admitting: Family Medicine

## 2013-11-26 ENCOUNTER — Other Ambulatory Visit: Payer: Self-pay | Admitting: *Deleted

## 2013-11-26 MED ORDER — ACCU-CHEK SOFTCLIX LANCET DEV MISC: Status: DC

## 2013-11-26 MED ORDER — ACCU-CHEK AVIVA PLUS W/DEVICE KIT: 1.0000 | PACK | Status: DC

## 2013-11-26 NOTE — Telephone Encounter (Signed)
Rx request to pharmacy/[facility]  

## 2014-01-28 ENCOUNTER — Telehealth: Payer: Self-pay

## 2014-01-28 DIAGNOSIS — E119 Type 2 diabetes mellitus without complications: Secondary | ICD-10-CM

## 2014-01-28 DIAGNOSIS — E785 Hyperlipidemia, unspecified: Secondary | ICD-10-CM

## 2014-01-28 LAB — LIPID PANEL
CHOL/HDL RATIO: 3.5 ratio
Cholesterol: 177 mg/dL (ref 0–200)
HDL: 51 mg/dL (ref >39)
LDL CALC: 91 mg/dL (ref 0–99)
Triglycerides: 174 mg/dL — ABNORMAL HIGH (ref <150)
VLDL: 35 mg/dL (ref 0–40)

## 2014-01-28 LAB — CBC
HCT: 40 % (ref 36.0–46.0)
Hemoglobin: 13.4 g/dL (ref 12.0–15.0)
MCH: 30 pg (ref 26.0–34.0)
MCHC: 33.5 g/dL (ref 30.0–36.0)
MCV: 89.5 fL (ref 78.0–100.0)
PLATELETS: 280 10*3/uL (ref 150–400)
RBC: 4.47 MIL/uL (ref 3.87–5.11)
RDW: 13.6 % (ref 11.5–15.5)
WBC: 6.2 10*3/uL (ref 4.0–10.5)

## 2014-01-28 LAB — HEPATIC FUNCTION PANEL
ALT: 31 U/L (ref 0–35)
AST: 36 U/L (ref 0–37)
Albumin: 3.9 g/dL (ref 3.5–5.2)
Alkaline Phosphatase: 57 U/L (ref 39–117)
BILIRUBIN DIRECT: 0.1 mg/dL (ref 0.0–0.3)
BILIRUBIN INDIRECT: 0.2 mg/dL (ref 0.2–1.2)
BILIRUBIN TOTAL: 0.3 mg/dL (ref 0.2–1.2)
TOTAL PROTEIN: 6.5 g/dL (ref 6.0–8.3)

## 2014-01-28 LAB — RENAL FUNCTION PANEL
ALBUMIN: 3.9 g/dL (ref 3.5–5.2)
BUN: 11 mg/dL (ref 6–23)
CALCIUM: 9.3 mg/dL (ref 8.4–10.5)
CO2: 29 mEq/L (ref 19–32)
Chloride: 102 mEq/L (ref 96–112)
Creat: 0.54 mg/dL (ref 0.50–1.10)
Glucose, Bld: 114 mg/dL — ABNORMAL HIGH (ref 70–99)
PHOSPHORUS: 3 mg/dL (ref 2.3–4.6)
Potassium: 4.5 mEq/L (ref 3.5–5.3)
SODIUM: 139 meq/L (ref 135–145)

## 2014-01-28 LAB — HEMOGLOBIN A1C
Hgb A1c MFr Bld: 7.5 % — ABNORMAL HIGH (ref <5.7)
MEAN PLASMA GLUCOSE: 169 mg/dL — AB (ref <117)

## 2014-01-28 LAB — TSH: TSH: 2.818 u[IU]/mL (ref 0.350–4.500)

## 2014-01-28 NOTE — Telephone Encounter (Signed)
Lab order placed.

## 2014-02-01 ENCOUNTER — Ambulatory Visit: Payer: Medicare Other | Admitting: Family Medicine

## 2014-02-04 ENCOUNTER — Ambulatory Visit (INDEPENDENT_AMBULATORY_CARE_PROVIDER_SITE_OTHER): Payer: Medicare Other | Admitting: Family Medicine

## 2014-02-04 ENCOUNTER — Encounter: Payer: Self-pay | Admitting: Family Medicine

## 2014-02-04 VITALS — BP 120/72 | HR 75 | Temp 98.1°F | Ht 64.0 in | Wt 173.0 lb

## 2014-02-04 DIAGNOSIS — R197 Diarrhea, unspecified: Secondary | ICD-10-CM

## 2014-02-04 DIAGNOSIS — Z23 Encounter for immunization: Secondary | ICD-10-CM

## 2014-02-04 DIAGNOSIS — E119 Type 2 diabetes mellitus without complications: Secondary | ICD-10-CM

## 2014-02-04 DIAGNOSIS — E785 Hyperlipidemia, unspecified: Secondary | ICD-10-CM

## 2014-02-04 MED ORDER — GLIMEPIRIDE 4 MG PO TABS: 4.0000 mg | ORAL_TABLET | Freq: Every day | ORAL | Status: DC

## 2014-02-04 MED ORDER — ROSUVASTATIN CALCIUM 20 MG PO TABS: 10.0000 mg | ORAL_TABLET | Freq: Every day | ORAL | Status: DC

## 2014-02-04 MED ORDER — METFORMIN HCL 1000 MG PO TABS: 1000.0000 mg | ORAL_TABLET | Freq: Two times a day (BID) | ORAL | Status: DC

## 2014-02-04 NOTE — Progress Notes (Signed)
Pre visit review using our clinic review tool, if applicable. No additional management support is needed unless otherwise documented below in the visit note. 

## 2014-02-04 NOTE — Progress Notes (Signed)
Subjective:     Patient ID: Gail Lester, female   DOB: Mar 10, 1941, 73 y.o.   MRN: [id number]  CC: 6 mo f/u DM, hyperlipidemia  HPI 1. Read an article and wants to stop Niacin. Only took Niacin x 1 month. She no longer takes krill oil. She is trying to manage cholesterol with diet, exercise, and Crestor.   2. Requests Metformin 1000 mg tablet instead of 500s. Reports 115 normal blood sugar in am before breakfast.  No falls. Lightheadedness x 1 per month. No tingling in feet. No vision disturbances. Reports visiting Opthalmology 11/2013. Her diarrhea from the past is resolved and pt reports it was not related to dose changes of Metformin.  3. Pt reports pain in right knee occasionally with walking, describes it as clicking or catching when twisting. She sometimes feel like something slides out of place. Denies edema, erythema, warmth to joint.   4. Pt is concerned about preventing Alzheimer's dementia. Reports it runs in the females of her family.   Review of Systems  Constitutional: Negative for fatigue.  Eyes: Negative for visual disturbance.  Gastrointestinal: Negative for nausea, vomiting, abdominal pain, diarrhea and constipation.  Endocrine: Negative for polydipsia and polyuria.  Neurological: Positive for light-headedness. Negative for syncope and numbness.  Psychiatric/Behavioral: The patient is not nervous/anxious.    Current Outpatient Prescriptions on File Prior to Visit  Medication Sig Dispense Refill  . aspirin 81 MG tablet Take 81 mg by mouth daily.      Marland Kitchen b complex vitamins tablet Take 1 tablet by mouth as directed. Two times a week      . Blood Glucose Monitoring Suppl (ACCU-CHEK AVIVA PLUS) W/DEVICE KIT 1 kit by Does not apply route as directed. Dx: 250.oo  1 kit  0  . CALCIUM PO Take 400 mg by mouth daily.      . Cholecalciferol (D-3-5) 5000 UNITS capsule Take 5,000 Units by mouth daily.      Marland Kitchen glucose blood (ACCU-CHEK AVIVA PLUS) test strip Check Blood Sugar bid  300  each  12  . Lancet Devices (ACCU-CHEK SOFTCLIX) lancets Use as instructed. Dx: 250.00  1 each  3  . Lutein 20 MG CAPS Take by mouth.      Marland Kitchen MAGNESIUM PO Take 1,000 mg by mouth daily.      . Multiple Vitamin (MULTIVITAMIN) tablet Take 1 tablet by mouth as directed. Two times a week      . Probiotic Product (PROBIOTIC DAILY PO) Take 1 capsule by mouth daily.      . vitamin C (ASCORBIC ACID) 500 MG tablet Take 500 mg by mouth 2 (two) times daily.       No current facility-administered medications on file prior to visit.   Pt has been taking ASA for years per request of past providers. She denies ever having any heart problems. No history of early MI in family.   Past Medical History  Diagnosis Date  . Diabetes mellitus   . Hypercholesteremia   . Arthritis   . Diabetes   . Chicken pox as a child  . Measles as a child  . Mumps as a child  . Diarrhea 02/10/2013  . Other and unspecified hyperlipidemia 02/10/2013  . Fibrocystic breast 02/10/2013    Left h/o FN biopsy   . Dupuytren's contracture of both hands 02/10/2013  . Other malaise and fatigue 08/04/2013  . Encounter for Medicare annual wellness exam 08/04/2013        Objective:   Physical  Exam  Constitutional: She is oriented to person, place, and time. Vital signs are normal. She appears well-developed and well-nourished. No distress.  HENT:  Head: Normocephalic and atraumatic.  Eyes: Lids are normal. Right eye exhibits no discharge. Left eye exhibits no discharge.  Neck: Normal carotid pulses present. Carotid bruit is not present.  Cardiovascular: Normal rate, regular rhythm, normal heart sounds and intact distal pulses.   Pulmonary/Chest: Effort normal and breath sounds normal. No respiratory distress.  Musculoskeletal:       Right knee: She exhibits normal range of motion and normal patellar mobility. No tenderness found.       Left knee: She exhibits normal range of motion and normal patellar mobility. No tenderness found.   Neurological: She is alert and oriented to person, place, and time. No sensory deficit.  Pt can recognize sensation to plantar surfaces of feet, bilaterally.   Skin: Skin is warm and dry. No rash noted. No erythema.  No rashes, lesions, wounds, or dryness of skin of feet bilaterally.  Psychiatric: She has a normal mood and affect.   Filed Vitals:   02/04/14 0927  BP: 120/72  Pulse: 75  Temp: 98.1 F (36.7 C)    Lab Results  Component Value Date   HGBA1C 7.5* 01/28/2014   Lab Results  Component Value Date   CHOL 177 01/28/2014   HDL 51 01/28/2014   LDLCALC 91 01/28/2014   LDLDIRECT 109.7 11/24/2012   TRIG 174* 01/28/2014   CHOLHDL 3.5 01/28/2014       Assessment:      1. Diabetes 2. Hyperlipidemia 3. Unspecified pain of right knee      Plan:      1. Diabetes- Pt was advised to monitor the diet more closely and increase glimepiride from 2 to 59m dose.  2. Hyperlipidemia- Pt was advised to monitor diet. She was educated about cholesterol and diabetic control to help minimize risks for Alzheimer's dementia. rosuvastatin was increased from 537mto 10 mg doses.   3. Unspecified pain of right knee- pt was advised to walk with caution to surroundings and to notify usKoreaf her knee becomes more painful or the episodes of pain are more frequent, episodes of swelling, redness, or warmth to joint. Pt was advised to try a knee brace while walking.    4. F/u 4 mo to reassess HgbA1c and lipids panel.  Meds ordered this encounter  Medications  . metFORMIN (GLUCOPHAGE) 1000 MG tablet    Sig: Take 1 tablet (1,000 mg total) by mouth 2 (two) times daily with a meal.    Dispense:  180 tablet    Refill:  3  . glimepiride (AMARYL) 4 MG tablet    Sig: Take 1 tablet (4 mg total) by mouth daily before breakfast. Dx Code: 250.00    Dispense:  90 tablet    Refill:  3  . rosuvastatin (CRESTOR) 20 MG tablet    Sig: Take 0.5 tablets (10 mg total) by mouth daily.    Dispense:  45 tablet    Refill:  3    02.12.15  JoMartiniqueausladen, PA-S     Patient seen, examined, and interviewed with student. Agree with documentation Diarrhea Improved with probiotics and fiber  Diabetes Tolerating Metformin, avoid simple carbs  Other and unspecified hyperlipidemia Tolerating Crestor, avoid trans fats, increase exercise

## 2014-02-04 NOTE — Patient Instructions (Signed)

## 2014-02-05 ENCOUNTER — Telehealth: Payer: Self-pay | Admitting: Family Medicine

## 2014-02-05 ENCOUNTER — Telehealth: Payer: Self-pay

## 2014-02-05 DIAGNOSIS — E785 Hyperlipidemia, unspecified: Secondary | ICD-10-CM

## 2014-02-05 DIAGNOSIS — E119 Type 2 diabetes mellitus without complications: Secondary | ICD-10-CM

## 2014-02-05 NOTE — Telephone Encounter (Signed)
Lab order week of 06/01/14   Labs prior to visit lipid, renal, hepatic, tsh, hgba1c, cbc

## 2014-02-05 NOTE — Telephone Encounter (Signed)
Relevant patient education assigned to patient using Emmi. ° °

## 2014-02-07 NOTE — Assessment & Plan Note (Signed)
Improved with probiotics and fiber

## 2014-02-07 NOTE — Assessment & Plan Note (Signed)
Tolerating Crestor, avoid trans fats, increase exercise

## 2014-02-07 NOTE — Assessment & Plan Note (Signed)
Tolerating Metformin, avoid simple carbs

## 2014-02-08 NOTE — Telephone Encounter (Signed)
Lab order placed.

## 2014-06-02 LAB — CBC
HCT: 37.8 % (ref 36.0–46.0)
Hemoglobin: 12.8 g/dL (ref 12.0–15.0)
MCH: 29.6 pg (ref 26.0–34.0)
MCHC: 33.9 g/dL (ref 30.0–36.0)
MCV: 87.5 fL (ref 78.0–100.0)
PLATELETS: 293 10*3/uL (ref 150–400)
RBC: 4.32 MIL/uL (ref 3.87–5.11)
RDW: 13.7 % (ref 11.5–15.5)
WBC: 6 10*3/uL (ref 4.0–10.5)

## 2014-06-02 LAB — RENAL FUNCTION PANEL
Albumin: 4 g/dL (ref 3.5–5.2)
BUN: 16 mg/dL (ref 6–23)
CALCIUM: 8.8 mg/dL (ref 8.4–10.5)
CO2: 23 mEq/L (ref 19–32)
Chloride: 103 mEq/L (ref 96–112)
Creat: 0.56 mg/dL (ref 0.50–1.10)
GLUCOSE: 132 mg/dL — AB (ref 70–99)
POTASSIUM: 4.3 meq/L (ref 3.5–5.3)
Phosphorus: 2.7 mg/dL (ref 2.3–4.6)
Sodium: 137 mEq/L (ref 135–145)

## 2014-06-02 LAB — LIPID PANEL
CHOL/HDL RATIO: 3.6 ratio
CHOLESTEROL: 139 mg/dL (ref 0–200)
HDL: 39 mg/dL — ABNORMAL LOW (ref >39)
LDL Cholesterol: 63 mg/dL (ref 0–99)
Triglycerides: 186 mg/dL — ABNORMAL HIGH (ref <150)
VLDL: 37 mg/dL (ref 0–40)

## 2014-06-02 LAB — HEPATIC FUNCTION PANEL
ALK PHOS: 54 U/L (ref 39–117)
ALT: 34 U/L (ref 0–35)
AST: 36 U/L (ref 0–37)
Albumin: 4 g/dL (ref 3.5–5.2)
BILIRUBIN INDIRECT: 0.3 mg/dL (ref 0.2–1.2)
Bilirubin, Direct: 0.1 mg/dL (ref 0.0–0.3)
TOTAL PROTEIN: 6.3 g/dL (ref 6.0–8.3)
Total Bilirubin: 0.4 mg/dL (ref 0.2–1.2)

## 2014-06-02 LAB — HEMOGLOBIN A1C
Hgb A1c MFr Bld: 8 % — ABNORMAL HIGH (ref <5.7)
Mean Plasma Glucose: 183 mg/dL — ABNORMAL HIGH (ref <117)

## 2014-06-02 LAB — TSH: TSH: 2.274 u[IU]/mL (ref 0.350–4.500)

## 2014-06-08 ENCOUNTER — Ambulatory Visit (INDEPENDENT_AMBULATORY_CARE_PROVIDER_SITE_OTHER): Payer: Medicare Other | Admitting: Family Medicine

## 2014-06-08 ENCOUNTER — Encounter: Payer: Self-pay | Admitting: Family Medicine

## 2014-06-08 VITALS — BP 126/62 | HR 81 | Temp 98.5°F | Ht 64.0 in | Wt 173.0 lb

## 2014-06-08 DIAGNOSIS — E119 Type 2 diabetes mellitus without complications: Secondary | ICD-10-CM

## 2014-06-08 DIAGNOSIS — E785 Hyperlipidemia, unspecified: Secondary | ICD-10-CM

## 2014-06-08 DIAGNOSIS — E663 Overweight: Secondary | ICD-10-CM

## 2014-06-08 NOTE — Patient Instructions (Signed)
Co Q 10 enzyme daily  Hyland's night time leg cramp med as needed  Basic Carbohydrate Counting Basic carbohydrate counting is a way to plan meals. It is done by counting the amount of carbohydrate in foods. Foods that have carbohydrates are starches (grains, beans, starchy vegetables) and sweets. Eating carbohydrates increases blood glucose (sugar) levels. People with diabetes use carbohydrate counting to help keep their blood glucose at a normal level.  COUNTING CARBOHYDRATES IN FOODS The first step in counting carbohydrates is to learn how many carbohydrate servings you should have in every meal. A dietitian can plan this for you. After learning the amount of carbohydrates to include in your meal plan, you can start to choose the carbohydrate-containing foods you want to eat.  There are 2 ways to identify the amount of carbohydrates in the foods you eat.  Read the Nutrition Facts panel on food labels. You need 2 pieces of information from the Nutrition Facts panel to count carbohydrates this way:  Serving size.  Total carbohydrate (in grams). Decide how many servings you will be eating. If it is 1 serving, you will be eating the amount of carbohydrate listed on the panel. If you will be eating 2 servings, you will be eating double the amount of carbohydrate listed on the panel.   Learn serving sizes. A serving size of most carbohydrate-containing foods is about 15 grams (g). Listed below are single serving sizes of common carbohydrate-containing foods:  1 slice bread.   cup unsweetened, dry cereal.   cup hot cereal.   cup rice.   cup mashed potatoes.   cup pasta.  1 cup fresh fruit.   cup canned fruit.  1 cup milk (whole, 2%, or skim).   cup starchy vegetables (peas, corn, or potatoes). Counting carbohydrates this way is similar to looking on the Nutrition Facts panel. Decide how many servings you will eat first. Multiply the number of servings you eat by 15 g. For  example, if you have 2 cups of strawberries, you had 2 servings. That means you had 30 g of carbohydrate (2 servings x 15 g = 30 g). CALCULATING CARBOHYDRATES IN A MEAL Sample dinner  3 oz chicken breast.   cup brown rice.   cup corn.  1 cup fat-free milk.  1 cup strawberries with sugar-free whipped topping. Carbohydrate calculation First, identify the foods that contain carbohydrate:  Rice.  Corn.  Milk.  Strawberries. Calculate the number of servings eaten:  2 servings rice.  1 serving corn.  1 serving milk.  1 serving strawberries. Multiply the number of servings by 15 g:  2 servings rice x 15 g = 30 g.  1 serving corn x 15 g = 15 g.  1 serving milk x 15 g = 15 g.  1 serving strawberries x 15 g = 15 g. Add the amounts to find the total carbohydrates eaten: 30 g + 15 g + 15 g + 15 g = 75 g carbohydrate eaten at dinner. Document Released: 12/10/2005 Document Revised: 03/03/2012 Document Reviewed: 10/26/2011 [facility] Patient Information 2014 [place], [place].

## 2014-06-08 NOTE — Progress Notes (Signed)
Patient ID: Gail Lester, female   DOB: 1941-11-14, 73 y.o.   MRN: [id number] Gail Lester [id number] 04-09-1941 06/08/2014      Progress Note-Follow Up  Subjective  Chief Complaint  Chief Complaint  Patient presents with  . Follow-up    4 month    HPI  Patient is a 73 year old female in today for routine medical care. She is here today for followup on abnormal medical problems. She offers no acute complaints. Has had no recent illness. Denies CP/palp/SOB/HA/congestion/fevers/GI or GU c/o. Taking meds as prescribed  Past Medical History  Diagnosis Date  . Diabetes mellitus   . Hypercholesteremia   . Arthritis   . Diabetes   . Chicken pox as a child  . Measles as a child  . Mumps as a child  . Diarrhea 02/10/2013  . Other and unspecified hyperlipidemia 02/10/2013  . Fibrocystic breast 02/10/2013    Left h/o FN biopsy   . Dupuytren's contracture of both hands 02/10/2013  . Other malaise and fatigue 08/04/2013  . Encounter for Medicare annual wellness exam 08/04/2013    Past Surgical History  Procedure Laterality Date  . Appendectomy  1955  . Orif wrist fracture  12/09/2011    Procedure: OPEN REDUCTION INTERNAL FIXATION (ORIF) WRIST FRACTURE;  Surgeon: Linna Hoff;  Location: [facility];  Service: Orthopedics;  Laterality: Left;  . Breast biopsy  1985-1990    benign    Family History  Problem Relation Age of Onset  . Colon cancer      grandparent  . Kidney disease Mother   . Diabetes Father   . Kidney disease Maternal Grandmother   . Cancer Maternal Grandmother     bladder  . Cancer Maternal Aunt   . Alzheimer's disease Maternal Aunt     History   Social History  . Marital Status: Divorced    Spouse Name: N/A    Number of Children: N/A  . Years of Education: N/A   Occupational History  . Not on file.   Social History Main Topics  . Smoking status: Former Smoker    Quit date: 11/17/1989  . Smokeless tobacco: Not on file  . Alcohol Use: No  . Drug Use: No   . Sexual Activity: No   Other Topics Concern  . Not on file   Social History Narrative  . No narrative on file    Current Outpatient Prescriptions on File Prior to Visit  Medication Sig Dispense Refill  . aspirin 81 MG tablet Take 81 mg by mouth daily.      Marland Kitchen b complex vitamins tablet Take 1 tablet by mouth as directed. Two times a week      . Blood Glucose Monitoring Suppl (ACCU-CHEK AVIVA PLUS) W/DEVICE KIT 1 kit by Does not apply route as directed. Dx: 250.oo  1 kit  0  . CALCIUM PO Take 400 mg by mouth daily.      . Cholecalciferol (D-3-5) 5000 UNITS capsule Take 5,000 Units by mouth daily.      Marland Kitchen glimepiride (AMARYL) 4 MG tablet Take 1 tablet (4 mg total) by mouth daily before breakfast. Dx Code: 250.00  90 tablet  3  . glucose blood (ACCU-CHEK AVIVA PLUS) test strip Check Blood Sugar bid  300 each  12  . Lancet Devices (ACCU-CHEK SOFTCLIX) lancets Use as instructed. Dx: 250.00  1 each  3  . Lutein 20 MG CAPS Take by mouth.      Marland Kitchen MAGNESIUM PO  Take 1,000 mg by mouth daily.      . metFORMIN (GLUCOPHAGE) 1000 MG tablet Take 1 tablet (1,000 mg total) by mouth 2 (two) times daily with a meal.  180 tablet  3  . Multiple Vitamin (MULTIVITAMIN) tablet Take 1 tablet by mouth as directed. Two times a week      . Probiotic Product (PROBIOTIC DAILY PO) Take 1 capsule by mouth daily.      . rosuvastatin (CRESTOR) 20 MG tablet Take 0.5 tablets (10 mg total) by mouth daily.  45 tablet  3  . vitamin C (ASCORBIC ACID) 500 MG tablet Take 500 mg by mouth 2 (two) times daily.       No current facility-administered medications on file prior to visit.    Allergies  Allergen Reactions  . Lipitor [Atorvastatin]     Diarrhea, muscle cramps    Review of Systems  Review of Systems  Constitutional: Negative for fever and malaise/fatigue.  HENT: Negative for congestion.   Eyes: Negative for discharge.  Respiratory: Negative for shortness of breath.   Cardiovascular: Negative for chest pain,  palpitations and leg swelling.  Gastrointestinal: Negative for nausea, abdominal pain and diarrhea.  Genitourinary: Negative for dysuria.  Musculoskeletal: Negative for falls.  Skin: Negative for rash.  Neurological: Negative for loss of consciousness and headaches.  Endo/Heme/Allergies: Negative for polydipsia.  Psychiatric/Behavioral: Negative for depression and suicidal ideas. The patient is not nervous/anxious and does not have insomnia.     Objective  BP 126/62  Pulse 81  Temp(Src) 98.5 F (36.9 C) (Oral)  Ht '5\' 4"'  (1.626 m)  Wt 173 lb 0.6 oz (78.49 kg)  BMI 29.69 kg/m2  SpO2 97%  Physical Exam  Physical Exam  Constitutional: She is oriented to person, place, and time and well-developed, well-nourished, and in no distress. No distress.  HENT:  Head: Normocephalic and atraumatic.  Eyes: Conjunctivae are normal.  Neck: Neck supple. No thyromegaly present.  Cardiovascular: Normal rate, regular rhythm and normal heart sounds.   No murmur heard. Pulmonary/Chest: Effort normal and breath sounds normal. She has no wheezes.  Abdominal: She exhibits no distension and no mass.  Musculoskeletal: She exhibits no edema.  Lymphadenopathy:    She has no cervical adenopathy.  Neurological: She is alert and oriented to person, place, and time.  Skin: Skin is warm and dry. No rash noted. She is not diaphoretic.  Psychiatric: Memory, affect and judgment normal.    Lab Results  Component Value Date   TSH 2.274 06/02/2014   Lab Results  Component Value Date   WBC 6.0 06/02/2014   HGB 12.8 06/02/2014   HCT 37.8 06/02/2014   MCV 87.5 06/02/2014   PLT 293 06/02/2014   Lab Results  Component Value Date   CREATININE 0.56 06/02/2014   BUN 16 06/02/2014   NA 137 06/02/2014   K 4.3 06/02/2014   CL 103 06/02/2014   CO2 23 06/02/2014   Lab Results  Component Value Date   ALT 34 06/02/2014   AST 36 06/02/2014   ALKPHOS 54 06/02/2014   BILITOT 0.4 06/02/2014   Lab Results  Component Value  Date   CHOL 139 06/02/2014   Lab Results  Component Value Date   HDL 39* 06/02/2014   Lab Results  Component Value Date   LDLCALC 63 06/02/2014   Lab Results  Component Value Date   TRIG 186* 06/02/2014   Lab Results  Component Value Date   CHOLHDL 3.6 06/02/2014  Assessment & Plan  Diabetes HGBA1C is up again, encouraged to increase exercise and minimize carbs, continue current meds for now. Recheck in 3 months  Other and unspecified hyperlipidemia Tolerating statin, encouraged heart healthy diet, avoid trans fats, minimize simple carbs and saturated fats. Increase exercise as tolerated  Overweight Encouraged DASH diet, decrease po intake and increase exercise as tolerated. Needs 7-8 hours of sleep nightly. Avoid trans fats, eat small, frequent meals every 4-5 hours with lean proteins, complex carbs and healthy fats. Minimize simple carbs, GMO foods.

## 2014-06-08 NOTE — Progress Notes (Signed)
Pre visit review using our clinic review tool, if applicable. No additional management support is needed unless otherwise documented below in the visit note. 

## 2014-06-14 ENCOUNTER — Encounter: Payer: Self-pay | Admitting: Family Medicine

## 2014-06-14 DIAGNOSIS — E663 Overweight: Secondary | ICD-10-CM | POA: Insufficient documentation

## 2014-06-14 NOTE — Assessment & Plan Note (Signed)
HGBA1C is up again, encouraged to increase exercise and minimize carbs, continue current meds for now. Recheck in 3 months

## 2014-06-14 NOTE — Assessment & Plan Note (Signed)
Encouraged DASH diet, decrease po intake and increase exercise as tolerated. Needs 7-8 hours of sleep nightly. Avoid trans fats, eat small, frequent meals every 4-5 hours with lean proteins, complex carbs and healthy fats. Minimize simple carbs, GMO foods. 

## 2014-06-14 NOTE — Assessment & Plan Note (Signed)
Tolerating statin, encouraged heart healthy diet, avoid trans fats, minimize simple carbs and saturated fats. Increase exercise as tolerated 

## 2014-07-06 ENCOUNTER — Telehealth: Payer: Self-pay | Admitting: *Deleted

## 2014-07-06 DIAGNOSIS — E785 Hyperlipidemia, unspecified: Secondary | ICD-10-CM

## 2014-07-06 NOTE — Telephone Encounter (Signed)
Received fax from [facility] requesting refill of crestor. Previous rx printed on 02/04/14, #45 x 2 refills. Form completed and forwarded to Provider for signature.

## 2014-07-07 MED ORDER — ROSUVASTATIN CALCIUM 20 MG PO TABS: 10.0000 mg | ORAL_TABLET | Freq: Every day | ORAL | Status: DC

## 2014-07-07 NOTE — Telephone Encounter (Signed)
Form faxed to [facility] @ [phone].

## 2014-08-31 ENCOUNTER — Ambulatory Visit (INDEPENDENT_AMBULATORY_CARE_PROVIDER_SITE_OTHER): Payer: Medicare Other

## 2014-08-31 DIAGNOSIS — Z23 Encounter for immunization: Secondary | ICD-10-CM

## 2014-08-31 NOTE — Progress Notes (Signed)
Pt tolerated injection well. No signs of reaction upon leaving clinic.

## 2014-09-01 ENCOUNTER — Encounter: Payer: Self-pay | Admitting: Gastroenterology

## 2014-09-28 DIAGNOSIS — L821 Other seborrheic keratosis: Secondary | ICD-10-CM

## 2014-09-28 DIAGNOSIS — D229 Melanocytic nevi, unspecified: Secondary | ICD-10-CM

## 2014-09-28 DIAGNOSIS — D1800 Hemangioma unspecified site: Secondary | ICD-10-CM | POA: Insufficient documentation

## 2014-09-28 HISTORY — DX: Hemangioma unspecified site: D18.00

## 2014-09-28 HISTORY — DX: Other seborrheic keratosis: L82.1

## 2014-09-28 HISTORY — DX: Melanocytic nevi, unspecified: D22.9

## 2014-10-11 ENCOUNTER — Other Ambulatory Visit: Payer: Self-pay | Admitting: Dermatology

## 2014-10-11 DIAGNOSIS — D239 Other benign neoplasm of skin, unspecified: Secondary | ICD-10-CM | POA: Insufficient documentation

## 2014-10-11 HISTORY — DX: Other benign neoplasm of skin, unspecified: D23.9

## 2014-10-22 ENCOUNTER — Encounter: Payer: Self-pay | Admitting: Family Medicine

## 2014-11-08 ENCOUNTER — Ambulatory Visit (INDEPENDENT_AMBULATORY_CARE_PROVIDER_SITE_OTHER): Payer: Medicare Other | Admitting: Family Medicine

## 2014-11-08 ENCOUNTER — Encounter: Payer: Self-pay | Admitting: Family Medicine

## 2014-11-08 VITALS — BP 130/72 | HR 70 | Temp 97.9°F | Ht 64.0 in | Wt 173.4 lb

## 2014-11-08 DIAGNOSIS — E785 Hyperlipidemia, unspecified: Secondary | ICD-10-CM

## 2014-11-08 DIAGNOSIS — M79602 Pain in left arm: Secondary | ICD-10-CM

## 2014-11-08 DIAGNOSIS — E118 Type 2 diabetes mellitus with unspecified complications: Secondary | ICD-10-CM

## 2014-11-08 DIAGNOSIS — E1165 Type 2 diabetes mellitus with hyperglycemia: Secondary | ICD-10-CM

## 2014-11-08 DIAGNOSIS — E663 Overweight: Secondary | ICD-10-CM

## 2014-11-08 DIAGNOSIS — Z0000 Encounter for general adult medical examination without abnormal findings: Secondary | ICD-10-CM

## 2014-11-08 DIAGNOSIS — M720 Palmar fascial fibromatosis [Dupuytren]: Secondary | ICD-10-CM

## 2014-11-08 DIAGNOSIS — IMO0001 Reserved for inherently not codable concepts without codable children: Secondary | ICD-10-CM

## 2014-11-08 DIAGNOSIS — M25512 Pain in left shoulder: Secondary | ICD-10-CM

## 2014-11-08 DIAGNOSIS — R030 Elevated blood-pressure reading, without diagnosis of hypertension: Secondary | ICD-10-CM

## 2014-11-08 DIAGNOSIS — M6740 Ganglion, unspecified site: Secondary | ICD-10-CM

## 2014-11-08 LAB — LIPID PANEL
Cholesterol: 139 mg/dL (ref 0–200)
HDL: 35 mg/dL — ABNORMAL LOW (ref >39.00)
LDL CALC: 67 mg/dL (ref 0–99)
NONHDL: 104
Total CHOL/HDL Ratio: 4
Triglycerides: 185 mg/dL — ABNORMAL HIGH (ref 0.0–149.0)
VLDL: 37 mg/dL (ref 0.0–40.0)

## 2014-11-08 LAB — CBC
HCT: 40.7 % (ref 36.0–46.0)
Hemoglobin: 13.1 g/dL (ref 12.0–15.0)
MCHC: 32.1 g/dL (ref 30.0–36.0)
MCV: 89.9 fl (ref 78.0–100.0)
Platelets: 306 10*3/uL (ref 150.0–400.0)
RBC: 4.53 Mil/uL (ref 3.87–5.11)
RDW: 13.6 % (ref 11.5–15.5)
WBC: 7.4 10*3/uL (ref 4.0–10.5)

## 2014-11-08 LAB — HEPATIC FUNCTION PANEL
ALBUMIN: 3.8 g/dL (ref 3.5–5.2)
ALK PHOS: 53 U/L (ref 39–117)
ALT: 36 U/L — ABNORMAL HIGH (ref 0–35)
AST: 46 U/L — ABNORMAL HIGH (ref 0–37)
BILIRUBIN DIRECT: 0 mg/dL (ref 0.0–0.3)
Total Bilirubin: 0.6 mg/dL (ref 0.2–1.2)
Total Protein: 6.8 g/dL (ref 6.0–8.3)

## 2014-11-08 LAB — RENAL FUNCTION PANEL
Albumin: 3.8 g/dL (ref 3.5–5.2)
BUN: 15 mg/dL (ref 6–23)
CHLORIDE: 107 meq/L (ref 96–112)
CO2: 22 mEq/L (ref 19–32)
Calcium: 9.1 mg/dL (ref 8.4–10.5)
Creatinine, Ser: 0.6 mg/dL (ref 0.4–1.2)
GFR: 112.68 mL/min (ref >60.00)
GLUCOSE: 159 mg/dL — AB (ref 70–99)
PHOSPHORUS: 2.5 mg/dL (ref 2.3–4.6)
POTASSIUM: 4.2 meq/L (ref 3.5–5.1)
Sodium: 140 mEq/L (ref 135–145)

## 2014-11-08 LAB — TSH: TSH: 2.43 u[IU]/mL (ref 0.35–4.50)

## 2014-11-08 LAB — MICROALBUMIN / CREATININE URINE RATIO
CREATININE, U: 124.7 mg/dL
MICROALB/CREAT RATIO: 2.5 mg/g (ref 0.0–30.0)
Microalb, Ur: 3.1 mg/dL — ABNORMAL HIGH (ref 0.0–1.9)

## 2014-11-08 LAB — HEMOGLOBIN A1C: HEMOGLOBIN A1C: 8.9 % — AB (ref 4.6–6.5)

## 2014-11-08 MED ORDER — LISINOPRIL 2.5 MG PO TABS: 2.5000 mg | ORAL_TABLET | Freq: Every day | ORAL | Status: DC

## 2014-11-08 NOTE — Progress Notes (Signed)
Pre visit review using our clinic review tool, if applicable. No additional management support is needed unless otherwise documented below in the visit note. 

## 2014-11-08 NOTE — Patient Instructions (Addendum)
Salon {as gel a a couple times a day, consider  Encouraged good sleep hygiene such as dark, quiet room. No blue/green glowing lights such as computer screens in bedroom. No alcohol or stimulants in evening. Cut down on caffeine as able. Regular exercise is helpful but not just prior to bed time. OK to try melatonin 2 mg to start and can increase to 10 mg as needed   Ganglion Cyst A ganglion cyst is a noncancerous, fluid-filled lump that occurs near joints or tendons. The ganglion cyst grows out of a joint or the lining of a tendon. It most often develops in the hand or wrist but can also develop in the shoulder, elbow, hip, knee, ankle, or foot. The round or oval ganglion can be pea sized or larger than a grape. Increased activity may enlarge the size of the cyst because more fluid starts to build up.  CAUSES  It is not completely known what causes a ganglion cyst to grow. However, it may be related to:  Inflammation or irritation around the joint.  An injury.  Repetitive movements or overuse.  Arthritis. SYMPTOMS  A lump most often appears in the hand or wrist, but can occur in other areas of the body. Generally, the lump is painless without other symptoms. However, sometimes pain can be felt during activity or when pressure is applied to the lump. The lump may even be tender to the touch. Tingling, pain, numbness, or muscle weakness can occur if the ganglion cyst presses on a nerve. Your grip may be weak and you may have less movement in your joints.  DIAGNOSIS  Ganglion cysts are most often diagnosed based on a physical exam, noting where the cyst is and how it looks. Your caregiver will feel the lump and may shine a light alongside it. If it is a ganglion, a light often shines through it. Your caregiver may order an X-ray, ultrasound, or MRI to rule out other conditions. TREATMENT  Ganglions usually go away on their own without treatment. If pain or other symptoms are involved, treatment  may be needed. Treatment is also needed if the ganglion limits your movement or if it gets infected. Treatment options include:  Wearing a wrist or finger brace or splint.  Taking anti-inflammatory medicine.  Draining fluid from the lump with a needle (aspiration).  Injecting a steroid into the joint.  Surgery to remove the ganglion cyst and its stalk that is attached to the joint or tendon. However, ganglion cysts can grow back. HOME CARE INSTRUCTIONS   Do not press on the ganglion, poke it with a needle, or hit it with a heavy object. You may rub the lump gently and often. Sometimes fluid moves out of the cyst.  Only take medicines as directed by your caregiver.  Wear your brace or splint as directed by your caregiver. SEEK MEDICAL CARE IF:   Your ganglion becomes larger or more painful.  You have increased redness, red streaks, or swelling.  You have pus coming from the lump.  You have weakness or numbness in the affected area. MAKE SURE YOU:   Understand these instructions.  Will watch your condition.  Will get help right away if you are not doing well or get worse. Document Released: 12/07/2000 Document Revised: 09/03/2012 Document Reviewed: 02/03/2008 [facility] Patient Information 2015 [place], [place]. This information is not intended to replace advice given to you by your health care provider. Make sure you discuss any questions you have with your health care  provider.  

## 2014-11-09 MED ORDER — GLIMEPIRIDE 4 MG PO TABS: 4.0000 mg | ORAL_TABLET | Freq: Two times a day (BID) | ORAL | Status: DC

## 2014-11-14 ENCOUNTER — Encounter: Payer: Self-pay | Admitting: Family Medicine

## 2014-11-14 DIAGNOSIS — M6740 Ganglion, unspecified site: Secondary | ICD-10-CM

## 2014-11-14 DIAGNOSIS — M79602 Pain in left arm: Secondary | ICD-10-CM | POA: Insufficient documentation

## 2014-11-14 HISTORY — DX: Ganglion, unspecified site: M67.40

## 2014-11-14 NOTE — Assessment & Plan Note (Signed)
hgba1c unacceptable, minimize simple carbs. Increase exercise as tolerated. Continue current meds but increase Glimeperide to bid

## 2014-11-14 NOTE — Assessment & Plan Note (Signed)
Encouraged DASH diet, decrease po intake and increase exercise as tolerated. Needs 7-8 hours of sleep nightly. Avoid trans fats, eat small, frequent meals every 4-5 hours with lean proteins, complex carbs and healthy fats. Minimize simple cabs 

## 2014-11-14 NOTE — Assessment & Plan Note (Signed)
Firm, tender circumscribed lesion over knuckle of right 2nd finger. Try ice and salon pas gel bid

## 2014-11-14 NOTE — Progress Notes (Signed)
Gail Lester  [id number] 1941/01/12 11/14/2014      Progress Note-Follow Up  Subjective  Chief Complaint  Chief Complaint  Patient presents with  . Annual Exam    medicare wellness    HPI  Patient is a 73 y.o. female in today for routine medical care. In today with numerous complaints. Has a firm tender lesion over knuckle of right hand 2nd finger. Ibuprofen helps temporarily with the discomfort. Also notes pain in left upper arm, pain worse with use. No obvious injury. Notes fatigue, polyuria and polydipsia. No recent illness. Denies CP/palp/SOB/HA/congestion/fevers/GI or GU c/o. Taking meds as prescribed  Past Medical History  Diagnosis Date  . Diabetes mellitus   . Hypercholesteremia   . Arthritis   . Diabetes   . Chicken pox as a child  . Measles as a child  . Mumps as a child  . Diarrhea 02/10/2013  . Other and unspecified hyperlipidemia 02/10/2013  . Fibrocystic breast 02/10/2013    Left h/o FN biopsy   . Dupuytren's contracture of both hands 02/10/2013  . Other malaise and fatigue 08/04/2013  . Encounter for Medicare annual wellness exam 08/04/2013  . Overweight 06/14/2014    Past Surgical History  Procedure Laterality Date  . Appendectomy  1955  . Orif wrist fracture  12/09/2011    Procedure: OPEN REDUCTION INTERNAL FIXATION (ORIF) WRIST FRACTURE;  Surgeon: Linna Hoff;  Location: [facility];  Service: Orthopedics;  Laterality: Left;  . Breast biopsy  1985-1990    benign    Family History  Problem Relation Age of Onset  . Colon cancer      grandparent  . Kidney disease Mother   . Diabetes Father   . Kidney disease Maternal Grandmother   . Cancer Maternal Grandmother     bladder  . Cancer Maternal Aunt   . Alzheimer's disease Maternal Aunt     History   Social History  . Marital Status: Divorced    Spouse Name: N/A    Number of Children: N/A  . Years of Education: N/A   Occupational History  . Not on file.   Social History Main Topics  .  Smoking status: Former Smoker    Quit date: 11/17/1989  . Smokeless tobacco: Not on file  . Alcohol Use: No  . Drug Use: No  . Sexual Activity: No   Other Topics Concern  . Not on file   Social History Narrative    Current Outpatient Prescriptions on File Prior to Visit  Medication Sig Dispense Refill  . aspirin 81 MG tablet Take 81 mg by mouth daily.    Marland Kitchen b complex vitamins tablet Take 1 tablet by mouth as directed. Two times a week    . Blood Glucose Monitoring Suppl (ACCU-CHEK AVIVA PLUS) W/DEVICE KIT 1 kit by Does not apply route as directed. Dx: 250.oo 1 kit 0  . CALCIUM PO Take 400 mg by mouth daily.    . Cholecalciferol (D-3-5) 5000 UNITS capsule Take 5,000 Units by mouth daily.    Marland Kitchen glucose blood (ACCU-CHEK AVIVA PLUS) test strip Check Blood Sugar bid 300 each 12  . Lancet Devices (ACCU-CHEK SOFTCLIX) lancets Use as instructed. Dx: 250.00 1 each 3  . Lutein 20 MG CAPS Take by mouth.    Marland Kitchen MAGNESIUM PO Take 250 mg by mouth daily.     . metFORMIN (GLUCOPHAGE) 1000 MG tablet Take 1 tablet (1,000 mg total) by mouth 2 (two) times daily with a meal. 180 tablet  3  . Multiple Vitamin (MULTIVITAMIN) tablet Take 1 tablet by mouth as directed. Two times a week    . Probiotic Product (PROBIOTIC DAILY PO) Take 1 capsule by mouth daily.    . rosuvastatin (CRESTOR) 20 MG tablet Take 0.5 tablets (10 mg total) by mouth daily. 45 tablet 2  . vitamin C (ASCORBIC ACID) 500 MG tablet Take 500 mg by mouth 2 (two) times daily.     No current facility-administered medications on file prior to visit.    Allergies  Allergen Reactions  . Lipitor [Atorvastatin]     Diarrhea, muscle cramps    Review of Systems  Review of Systems  Constitutional: Negative for fever, chills and malaise/fatigue.  HENT: Negative for congestion, hearing loss and nosebleeds.   Eyes: Negative for discharge.  Respiratory: Negative for cough, sputum production, shortness of breath and wheezing.   Cardiovascular:  Negative for chest pain, palpitations and leg swelling.  Gastrointestinal: Negative for heartburn, nausea, vomiting, abdominal pain, diarrhea, constipation and blood in stool.  Genitourinary: Negative for dysuria, urgency, frequency and hematuria.  Musculoskeletal: Positive for joint pain. Negative for myalgias, back pain and falls.       Upper arm pain and shoulder pain  Skin: Negative for rash.  Neurological: Negative for dizziness, tremors, sensory change, focal weakness, loss of consciousness, weakness and headaches.  Endo/Heme/Allergies: Negative for polydipsia. Does not bruise/bleed easily.  Psychiatric/Behavioral: Negative for depression and suicidal ideas. The patient is not nervous/anxious and does not have insomnia.     Objective  BP 130/72 mmHg  Pulse 70  Temp(Src) 97.9 F (36.6 C) (Oral)  Ht 5' 4" (1.626 m)  Wt 173 lb 6.4 oz (78.654 kg)  BMI 29.75 kg/m2  SpO2 98%  Physical Exam  Physical Exam  Constitutional: She is oriented to person, place, and time and well-developed, well-nourished, and in no distress. No distress.  HENT:  Head: Normocephalic and atraumatic.  Right Ear: External ear normal.  Left Ear: External ear normal.  Nose: Nose normal.  Mouth/Throat: Oropharynx is clear and moist. No oropharyngeal exudate.  Eyes: Conjunctivae are normal. Pupils are equal, round, and reactive to light. Right eye exhibits no discharge. Left eye exhibits no discharge. No scleral icterus.  Neck: Normal range of motion. Neck supple. No thyromegaly present.  Cardiovascular: Normal rate, regular rhythm, normal heart sounds and intact distal pulses.   No murmur heard. Pulmonary/Chest: Effort normal and breath sounds normal. No respiratory distress. She has no wheezes. She has no rales.  Abdominal: Soft. Bowel sounds are normal. She exhibits no distension and no mass. There is no tenderness.  Musculoskeletal: Normal range of motion. She exhibits no edema or tenderness.  Scar tissue  b/l palms, firm lesion over right hand 2nd kuckle  Lymphadenopathy:    She has no cervical adenopathy.  Neurological: She is alert and oriented to person, place, and time. She has normal reflexes. No cranial nerve deficit. Coordination normal.  Skin: Skin is warm and dry. No rash noted. She is not diaphoretic.  Psychiatric: Mood, memory and affect normal.    Lab Results  Component Value Date   TSH 2.43 11/08/2014   Lab Results  Component Value Date   WBC 7.4 11/08/2014   HGB 13.1 11/08/2014   HCT 40.7 11/08/2014   MCV 89.9 11/08/2014   PLT 306.0 11/08/2014   Lab Results  Component Value Date   CREATININE 0.6 11/08/2014   BUN 15 11/08/2014   NA 140 11/08/2014   K 4.2 11/08/2014  CL 107 11/08/2014   CO2 22 11/08/2014   Lab Results  Component Value Date   ALT 36* 11/08/2014   AST 46* 11/08/2014   ALKPHOS 53 11/08/2014   BILITOT 0.6 11/08/2014   Lab Results  Component Value Date   CHOL 139 11/08/2014   Lab Results  Component Value Date   HDL 35.00* 11/08/2014   Lab Results  Component Value Date   LDLCALC 67 11/08/2014   Lab Results  Component Value Date   TRIG 185.0* 11/08/2014   Lab Results  Component Value Date   CHOLHDL 4 11/08/2014     Assessment & Plan  Diabetes hgba1c unacceptable, minimize simple carbs. Increase exercise as tolerated. Continue current meds but increase Glimeperide to bid  Dupuytren's contracture of both hands Referred to ortho try Salon Pas prn  Overweight Encouraged DASH diet, decrease po intake and increase exercise as tolerated. Needs 7-8 hours of sleep nightly. Avoid trans fats, eat small, frequent meals every 4-5 hours with lean proteins, complex carbs and healthy fats. Minimize simple cabs  Encounter for Medicare annual wellness exam Patient denies any difficulties at home. No trouble with ADLs, depression or falls. No recent changes to vision or hearing. Is UTD with immunizations. Is UTD with screening. Discussed  Advanced Directives, patient agrees to bring [place] copies of documents if can. Encouraged heart healthy diet, exercise as tolerated and adequate sleep. Sees  Dr Delman Cheadle opthamology Dr Deatra Ina gastroenterology Dr [place] derm Pap June 2013 Colonoscopy 2011 repeat in 5 years  Left arm pain Referred to sports med for further evaluation. Encouraged moist heat and gentle stretching as tolerated. May try NSAIDs and prescription meds as directed and report if symptoms worsen or seek immediate care  Ganglion cyst Firm, tender circumscribed lesion over knuckle of right 2nd finger. Try ice and salon pas gel bid

## 2014-11-14 NOTE — Assessment & Plan Note (Signed)
Referred to sports med for further evaluation. Encouraged moist heat and gentle stretching as tolerated. May try NSAIDs and prescription meds as directed and report if symptoms worsen or seek immediate care

## 2014-11-14 NOTE — Assessment & Plan Note (Addendum)
Patient denies any difficulties at home. No trouble with ADLs, depression or falls. No recent changes to vision or hearing. Is UTD with immunizations. Is UTD with screening. Discussed Advanced Directives, patient agrees to bring [place] copies of documents if can. Encouraged heart healthy diet, exercise as tolerated and adequate sleep. Sees  Dr Delman Cheadle opthamology Dr Deatra Ina gastroenterology Dr [place] derm Pap June 2013 Colonoscopy 2011 repeat in 5 years

## 2014-11-14 NOTE — Assessment & Plan Note (Signed)
Referred to ortho try [facility] prn

## 2014-11-24 ENCOUNTER — Encounter: Payer: Self-pay | Admitting: Family Medicine

## 2014-11-24 ENCOUNTER — Other Ambulatory Visit (INDEPENDENT_AMBULATORY_CARE_PROVIDER_SITE_OTHER): Payer: Medicare Other

## 2014-11-24 ENCOUNTER — Ambulatory Visit (INDEPENDENT_AMBULATORY_CARE_PROVIDER_SITE_OTHER): Payer: Medicare Other | Admitting: Family Medicine

## 2014-11-24 VITALS — BP 116/70 | HR 81 | Ht 64.0 in | Wt 176.0 lb

## 2014-11-24 DIAGNOSIS — M12819 Other specific arthropathies, not elsewhere classified, unspecified shoulder: Secondary | ICD-10-CM | POA: Insufficient documentation

## 2014-11-24 DIAGNOSIS — M25512 Pain in left shoulder: Secondary | ICD-10-CM

## 2014-11-24 DIAGNOSIS — M12812 Other specific arthropathies, not elsewhere classified, left shoulder: Secondary | ICD-10-CM

## 2014-11-24 DIAGNOSIS — M12512 Traumatic arthropathy, left shoulder: Secondary | ICD-10-CM

## 2014-11-24 HISTORY — DX: Other specific arthropathies, not elsewhere classified, unspecified shoulder: M12.819

## 2014-11-24 NOTE — Progress Notes (Signed)
[name] [place], [place] [address] Phone: [phone] Subjective:    I'm seeing this patient by the request  of:  Penni Homans, MD   CC: Shoulder pain  [id number] Gail Lester is a 73 y.o. female coming in with complaint of shoulder pain. This is left-sided.     Past medical history, social, surgical and family history all reviewed in electronic medical record.   Review of Systems: No headache, visual changes, nausea, vomiting, diarrhea, constipation, dizziness, abdominal pain, skin rash, fevers, chills, night sweats, weight loss, swollen lymph nodes, body aches, joint swelling, muscle aches, chest pain, shortness of breath, mood changes.   Objective Blood pressure 116/70, pulse 81, height 5\' 4"  (1.626 m), weight 176 lb (79.833 kg), SpO2 96 %.  General: No apparent distress alert and oriented x3 mood and affect normal, dressed appropriately.  HEENT: Pupils equal, extraocular movements intact  Respiratory: Patient's speak in full sentences and does not appear short of breath  Cardiovascular: No lower extremity edema, non tender, no erythema  Skin: Warm dry intact with no signs of infection or rash on extremities or on axial skeleton.  Abdomen: Soft nontender  Neuro: Cranial nerves II through XII are intact, neurovascularly intact in all extremities with 2+ DTRs and 2+ pulses.  Lymph: No lymphadenopathy of posterior or anterior cervical chain or axillae bilaterally.  Gait normal with good balance and coordination.  MSK:  Non tender with full range of motion and good stability and symmetric strength and tone of  elbows, wrist, hip, knee and ankles bilaterally.  Shoulder: left Inspection reveals no abnormalities, atrophy or asymmetry. Palpation is normal with no tenderness over AC joint or bicipital groove. ROM is full in all planes passively. Rotator cuff strength 4+ out of 5 compared to 5 out of 5 on the contralateral side signs  of impingement with positive Neer and Hawkin's tests, but negative empty can sign. Speeds and Yergason's tests normal. No labral pathology noted with negative Obrien's, negative clunk and good stability. Normal scapular function observed. No painful arc and no drop arm sign. No apprehension sign  MSK US performed of: left This study was ordered, performed, and interpreted by Charlann Boxer D.O.  Shoulder:   Supraspinatus:  Degenerative tear noted. There is mild retraction but negligible, Bursal bulge seen with shoulder abduction on impingement view. Infraspinatus:  Degenerative changes also noted.. Significant increase in Doppler flow Subscapularis:  Degenerative changes noted with mild degenerative tear noted. Teres Minor:  Appears normal on long and transverse views. AC joint:  Capsule undistended, no geyser sign. Glenohumeral Joint:  Moderate osteophytic changes but no effusion. Glenoid Labrum: Mild degenerative changes Biceps Tendon:  Appears normal on long and transverse views, no fraying of tendon, tendon located in intertubercular groove, no subluxation with shoulder internal or external rotation.  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  Verbal informed consent obtained.  Time-out conducted.  Noted no overlying erythema, induration, or other signs of local infection.  Skin prepped in a sterile fashion.  Local anesthesia: Topical Ethyl chloride.  With sterile technique and under real time ultrasound guidance:  Joint visualized.  23g 1  inch needle inserted posterior approach. Pictures  taken for needle  placement. Patient did have injection of 2 cc of 1% lidocaine, 2 cc of 0.5% Marcaine, and 1.0 cc of Kenalog 40 mg/dL. Completed without difficulty  Pain immediately resolved suggesting accurate placement of the medication.  Advised to call if fevers/chills, erythema, induration, drainage, or persistent bleeding.  Images permanently stored and available for review in the ultrasound unit.  Impression: Technically successful ultrasound guided injection.    Impression and Recommendations:     This case required medical decision making of moderate complexity.

## 2014-11-24 NOTE — Assessment & Plan Note (Signed)
Degenerative tears noted today and moderate OA changes,. Discussed icing, HEP, given handout, discussed topicals and OTC medications.  RTC in 3 weeks to makes sure continue to improve.

## 2014-11-24 NOTE — Patient Instructions (Addendum)
Very nice to meet you Ice 20 minutes 2 times daily. Usually after activity and before bed. Exercises 3 times a week.  Turmeric 500mg  twice daily Vitamin D 2000 IU daily Try the pennsaid twice daily as needed See me again in 3 weeks.

## 2015-01-23 ENCOUNTER — Other Ambulatory Visit: Payer: Self-pay | Admitting: Family

## 2015-01-23 ENCOUNTER — Other Ambulatory Visit: Payer: Self-pay | Admitting: Family Medicine

## 2015-01-24 NOTE — Telephone Encounter (Signed)
Rx request to pharmacy/[facility]  

## 2015-03-11 ENCOUNTER — Encounter: Payer: Self-pay | Admitting: Family Medicine

## 2015-03-11 ENCOUNTER — Ambulatory Visit (INDEPENDENT_AMBULATORY_CARE_PROVIDER_SITE_OTHER): Payer: Medicare Other | Admitting: Family Medicine

## 2015-03-11 VITALS — BP 122/80 | HR 74 | Temp 97.9°F | Ht 64.0 in | Wt 164.0 lb

## 2015-03-11 DIAGNOSIS — R945 Abnormal results of liver function studies: Secondary | ICD-10-CM

## 2015-03-11 DIAGNOSIS — E782 Mixed hyperlipidemia: Secondary | ICD-10-CM

## 2015-03-11 DIAGNOSIS — E669 Obesity, unspecified: Secondary | ICD-10-CM | POA: Diagnosis not present

## 2015-03-11 DIAGNOSIS — K7689 Other specified diseases of liver: Secondary | ICD-10-CM

## 2015-03-11 DIAGNOSIS — R059 Cough, unspecified: Secondary | ICD-10-CM

## 2015-03-11 DIAGNOSIS — E663 Overweight: Secondary | ICD-10-CM

## 2015-03-11 DIAGNOSIS — E119 Type 2 diabetes mellitus without complications: Secondary | ICD-10-CM

## 2015-03-11 DIAGNOSIS — R05 Cough: Secondary | ICD-10-CM

## 2015-03-11 DIAGNOSIS — R809 Proteinuria, unspecified: Secondary | ICD-10-CM

## 2015-03-11 DIAGNOSIS — E1169 Type 2 diabetes mellitus with other specified complication: Secondary | ICD-10-CM

## 2015-03-11 LAB — COMPREHENSIVE METABOLIC PANEL
ALK PHOS: 54 U/L (ref 39–117)
ALT: 22 U/L (ref 0–35)
AST: 24 U/L (ref 0–37)
Albumin: 3.9 g/dL (ref 3.5–5.2)
BILIRUBIN TOTAL: 0.3 mg/dL (ref 0.2–1.2)
BUN: 8 mg/dL (ref 6–23)
CO2: 26 meq/L (ref 19–32)
CREATININE: 0.59 mg/dL (ref 0.40–1.20)
Calcium: 9.1 mg/dL (ref 8.4–10.5)
Chloride: 105 mEq/L (ref 96–112)
GFR: 105.99 mL/min (ref >60.00)
GLUCOSE: 111 mg/dL — AB (ref 70–99)
Potassium: 4 mEq/L (ref 3.5–5.1)
Sodium: 139 mEq/L (ref 135–145)
Total Protein: 6.8 g/dL (ref 6.0–8.3)

## 2015-03-11 LAB — LIPID PANEL
CHOL/HDL RATIO: 3
CHOLESTEROL: 137 mg/dL (ref 0–200)
HDL: 41.3 mg/dL (ref >39.00)
LDL CALC: 65 mg/dL (ref 0–99)
NonHDL: 95.7
TRIGLYCERIDES: 154 mg/dL — AB (ref 0.0–149.0)
VLDL: 30.8 mg/dL (ref 0.0–40.0)

## 2015-03-11 LAB — HEMOGLOBIN A1C: Hgb A1c MFr Bld: 8.5 % — ABNORMAL HIGH (ref 4.6–6.5)

## 2015-03-11 LAB — TSH: TSH: 2.22 u[IU]/mL (ref 0.35–4.50)

## 2015-03-11 LAB — CBC
HEMATOCRIT: 39.5 % (ref 36.0–46.0)
HEMOGLOBIN: 13.2 g/dL (ref 12.0–15.0)
MCHC: 33.5 g/dL (ref 30.0–36.0)
MCV: 86.7 fl (ref 78.0–100.0)
PLATELETS: 295 10*3/uL (ref 150.0–400.0)
RBC: 4.56 Mil/uL (ref 3.87–5.11)
RDW: 13.3 % (ref 11.5–15.5)
WBC: 6.7 10*3/uL (ref 4.0–10.5)

## 2015-03-11 LAB — MICROALBUMIN / CREATININE URINE RATIO
Creatinine,U: 48.1 mg/dL
MICROALB/CREAT RATIO: 1.5 mg/g (ref 0.0–30.0)
Microalb, Ur: <0.7 mg/dL (ref 0.0–1.9)

## 2015-03-11 NOTE — Patient Instructions (Addendum)
Krill oil caps daily or Salmon oil caps daily Zinc 50 mg daily  Melatonin 2-10 mg prior to bed  Basic Carbohydrate Counting for Diabetes Mellitus Carbohydrate counting is a method for keeping track of the amount of carbohydrates you eat. Eating carbohydrates naturally increases the level of sugar (glucose) in your blood, so it is important for you to know the amount that is okay for you to have in every meal. Carbohydrate counting helps keep the level of glucose in your blood within normal limits. The amount of carbohydrates allowed is different for every person. A dietitian can help you calculate the amount that is right for you. Once you know the amount of carbohydrates you can have, you can count the carbohydrates in the foods you want to eat. Carbohydrates are found in the following foods:  Grains, such as breads and cereals.  Dried beans and soy products.  Starchy vegetables, such as potatoes, peas, and corn.  Fruit and fruit juices.  Milk and yogurt.  Sweets and snack foods, such as cake, cookies, candy, chips, soft drinks, and fruit drinks. CARBOHYDRATE COUNTING There are two ways to count the carbohydrates in your food. You can use either of the methods or a combination of both. Reading the "Nutrition Facts" on [place] The "Nutrition Facts" is an area that is included on the labels of almost all packaged food and beverages in the [place]. It includes the serving size of that food or beverage and information about the nutrients in each serving of the food, including the grams (g) of carbohydrate per serving.  Decide the number of servings of this food or beverage that you will be able to eat or drink. Multiply that number of servings by the number of grams of carbohydrate that is listed on the label for that serving. The total will be the amount of carbohydrates you will be having when you eat or drink this food or beverage. Learning Standard Serving Sizes of Food When  you eat food that is not packaged or does not include "Nutrition Facts" on the label, you need to measure the servings in order to count the amount of carbohydrates.A serving of most carbohydrate-rich foods contains about 15 g of carbohydrates. The following list includes serving sizes of carbohydrate-rich foods that provide 15 g ofcarbohydrate per serving:   1 slice of bread (1 oz) or 1 six-inch tortilla.    of a hamburger bun or English muffin.  4-6 crackers.   cup unsweetened dry cereal.    cup hot cereal.   cup rice or pasta.    cup mashed potatoes or  of a large baked potato.  1 cup fresh fruit or one small piece of fruit.    cup canned or frozen fruit or fruit juice.  1 cup milk.   cup plain fat-free yogurt or yogurt sweetened with artificial sweeteners.   cup cooked dried beans or starchy vegetable, such as peas, corn, or potatoes.  Decide the number of standard-size servings that you will eat. Multiply that number of servings by 15 (the grams of carbohydrates in that serving). For example, if you eat 2 cups of strawberries, you will have eaten 2 servings and 30 g of carbohydrates (2 servings x 15 g = 30 g). For foods such as soups and casseroles, in which more than one food is mixed in, you will need to count the carbohydrates in each food that is included. EXAMPLE OF CARBOHYDRATE COUNTING Sample Dinner  3 oz chicken breast.  cup of brown rice.   cup of corn.  1 cup milk.   1 cup strawberries with sugar-free whipped topping.  Carbohydrate Calculation Step 1: Identify the foods that contain carbohydrates:   Rice.   Corn.   Milk.   Strawberries. Step 2:Calculate the number of servings eaten of each:   2 servings of rice.   1 serving of corn.   1 serving of milk.   1 serving of strawberries. Step 3: Multiply each of those number of servings by 15 g:   2 servings of rice x 15 g = 30 g.   1 serving of corn x 15 g = 15 g.    1 serving of milk x 15 g = 15 g.   1 serving of strawberries x 15 g = 15 g. Step 4: Add together all of the amounts to find the total grams of carbohydrates eaten: 30 g + 15 g + 15 g + 15 g = 75 g. Document Released: 12/10/2005 Document Revised: 04/26/2014 Document Reviewed: 11/06/2013 [facility] Patient Information 2015 [place], [place]. This information is not intended to replace advice given to you by your health care provider. Make sure you discuss any questions you have with your health care provider.

## 2015-03-11 NOTE — Progress Notes (Signed)
Pre visit review using our clinic review tool, if applicable. No additional management support is needed unless otherwise documented below in the visit note. 

## 2015-03-24 ENCOUNTER — Encounter: Payer: Self-pay | Admitting: Family Medicine

## 2015-03-24 DIAGNOSIS — R05 Cough: Secondary | ICD-10-CM | POA: Insufficient documentation

## 2015-03-24 DIAGNOSIS — R059 Cough, unspecified: Secondary | ICD-10-CM | POA: Insufficient documentation

## 2015-03-24 NOTE — Assessment & Plan Note (Signed)
Stopped lisinopril and cough improved

## 2015-03-24 NOTE — Progress Notes (Signed)
Gail Lester  [id number] 09-20-1941 03/24/2015      Progress Note-Follow Up  Subjective  Chief Complaint  Chief Complaint  Patient presents with  . Follow-up    HPI  Patient is a 74 y.o. female in today for routine medical care. Patient is in today for follow-up on numerous complaints. Overall she's doing well. She stopped lisinopril due to cough and the cough improved. Cough was nonproductive. She has been exercising more and eating better and says overall she feels much better. Her energy is better. She is frustrated with some recent change in her glasses and uses trifocals but continues to struggle slightly. Will follow up with ophthalmology. Continues to struggle with shoulder pain and other joint pains it time but finds these manageable and no recent injury or acute worsening.  Past Medical History  Diagnosis Date  . Diabetes mellitus   . Hypercholesteremia   . Arthritis   . Diabetes   . Chicken pox as a child  . Measles as a child  . Mumps as a child  . Diarrhea 02/10/2013  . Other and unspecified hyperlipidemia 02/10/2013  . Fibrocystic breast 02/10/2013    Left h/o FN biopsy   . Dupuytren's contracture of both hands 02/10/2013  . Other malaise and fatigue 08/04/2013  . Encounter for Medicare annual wellness exam 08/04/2013  . Overweight 06/14/2014  . Ganglion cyst 11/14/2014    Right 2nd finger  . Left arm pain 11/14/2014  . Diabetes mellitus type 2 in obese 02/10/2013    Saw Dr Delman Cheadle, eye doctor in 01/01/13, exam in chart   . Cough 03/24/2015    Past Surgical History  Procedure Laterality Date  . Appendectomy  1955  . Orif wrist fracture  12/09/2011    Procedure: OPEN REDUCTION INTERNAL FIXATION (ORIF) WRIST FRACTURE;  Surgeon: Linna Hoff;  Location: [place];  Service: Orthopedics;  Laterality: Left;  . Breast biopsy  1985-1990    benign    Family History  Problem Relation Age of Onset  . Colon cancer      grandparent  . Kidney disease Mother   . Diabetes  Father   . Kidney disease Maternal Grandmother   . Cancer Maternal Grandmother     bladder  . Cancer Maternal Aunt   . Alzheimer's disease Maternal Aunt     History   Social History  . Marital Status: Divorced    Spouse Name: N/A  . Number of Children: N/A  . Years of Education: N/A   Occupational History  . Not on file.   Social History Main Topics  . Smoking status: Former Smoker    Quit date: 11/17/1989  . Smokeless tobacco: Not on file  . Alcohol Use: No  . Drug Use: No  . Sexual Activity: No   Other Topics Concern  . Not on file   Social History Narrative    Current Outpatient Prescriptions on File Prior to Visit  Medication Sig Dispense Refill  . aspirin 81 MG tablet Take 81 mg by mouth daily.    Marland Kitchen b complex vitamins tablet Take 1 tablet by mouth as directed. Two times a week    . Blood Glucose Monitoring Suppl (ACCU-CHEK AVIVA PLUS) W/DEVICE KIT 1 kit by Does not apply route as directed. Dx: 250.oo 1 kit 0  . CALCIUM PO Take 400 mg by mouth daily.    . Cholecalciferol (D-3-5) 5000 UNITS capsule Take 5,000 Units by mouth daily.    . Coenzyme Q10 (COQ10)  100 MG CAPS Take 1 capsule by mouth daily.    . CRESTOR 20 MG tablet Take one-half tablet by  mouth daily 45 tablet 1  . glimepiride (AMARYL) 4 MG tablet Take 1 tablet (4 mg total) by mouth 2 (two) times daily. Dx Code: 250.00 180 tablet 1  . glucose blood (ACCU-CHEK AVIVA PLUS) test strip Check Blood Sugar bid 300 each 12  . Lancet Devices (ACCU-CHEK SOFTCLIX) lancets Use as instructed. Dx: 250.00 1 each 3  . Lutein 20 MG CAPS Take by mouth.    Marland Kitchen MAGNESIUM PO Take 250 mg by mouth daily.     . metFORMIN (GLUCOPHAGE) 1000 MG tablet Take 1 tablet by mouth  twice a day with a meal 180 tablet 1  . Multiple Vitamin (MULTIVITAMIN) tablet Take 1 tablet by mouth as directed. Two times a week    . Probiotic Product (PROBIOTIC DAILY PO) Take 1 capsule by mouth daily.    . vitamin C (ASCORBIC ACID) 500 MG tablet Take 500  mg by mouth 2 (two) times daily.     No current facility-administered medications on file prior to visit.    Allergies  Allergen Reactions  . Lipitor [Atorvastatin]     Diarrhea, muscle cramps  . Lisinopril     myalgias    Review of Systems  Review of Systems  Constitutional: Negative for fever and malaise/fatigue.  HENT: Positive for hearing loss. Negative for congestion.   Eyes: Negative for discharge.  Respiratory: Negative for shortness of breath.   Cardiovascular: Negative for chest pain, palpitations and leg swelling.  Gastrointestinal: Negative for nausea, abdominal pain and diarrhea.  Genitourinary: Negative for dysuria.  Musculoskeletal: Positive for joint pain. Negative for falls.  Skin: Negative for rash.  Neurological: Negative for loss of consciousness and headaches.  Endo/Heme/Allergies: Negative for polydipsia.  Psychiatric/Behavioral: Negative for depression and suicidal ideas. The patient is not nervous/anxious and does not have insomnia.     Objective  BP 122/80 mmHg  Pulse 74  Temp(Src) 97.9 F (36.6 C) (Oral)  Ht 5' 4" (1.626 m)  Wt 164 lb (74.39 kg)  BMI 28.14 kg/m2  SpO2 96%  Physical Exam  Physical Exam  Constitutional: She is oriented to person, place, and time and well-developed, well-nourished, and in no distress. No distress.  HENT:  Head: Normocephalic and atraumatic.  Eyes: Conjunctivae are normal.  Neck: Neck supple. No thyromegaly present.  Cardiovascular: Normal rate, regular rhythm and normal heart sounds.   No murmur heard. Pulmonary/Chest: Effort normal and breath sounds normal. She has no wheezes.  Abdominal: She exhibits no distension and no mass.  Musculoskeletal: She exhibits no edema.  Lymphadenopathy:    She has no cervical adenopathy.  Neurological: She is alert and oriented to person, place, and time.  Skin: Skin is warm and dry. No rash noted. She is not diaphoretic.  Psychiatric: Memory, affect and judgment normal.     Lab Results  Component Value Date   TSH 2.22 03/11/2015   Lab Results  Component Value Date   WBC 6.7 03/11/2015   HGB 13.2 03/11/2015   HCT 39.5 03/11/2015   MCV 86.7 03/11/2015   PLT 295.0 03/11/2015   Lab Results  Component Value Date   CREATININE 0.59 03/11/2015   BUN 8 03/11/2015   NA 139 03/11/2015   K 4.0 03/11/2015   CL 105 03/11/2015   CO2 26 03/11/2015   Lab Results  Component Value Date   ALT 22 03/11/2015   AST 24  03/11/2015   ALKPHOS 54 03/11/2015   BILITOT 0.3 03/11/2015   Lab Results  Component Value Date   CHOL 137 03/11/2015   Lab Results  Component Value Date   HDL 41.30 03/11/2015   Lab Results  Component Value Date   LDLCALC 65 03/11/2015   Lab Results  Component Value Date   TRIG 154.0* 03/11/2015   Lab Results  Component Value Date   CHOLHDL 3 03/11/2015     Assessment & Plan  Diabetes mellitus type 2 in obese Has made some good changes with diet and exercise, is tolerating increased dose of Metformin, will recheck hgba1c in 3 months and consider further med changes at thtat time   Overweight Encouraged DASH diet, decrease po intake and increase exercise as tolerated. Needs 7-8 hours of sleep nightly. Avoid trans fats, eat small, frequent meals every 4-5 hours with lean proteins, complex carbs and healthy fats. Minimize simple carbs, GMO foods.   Hyperlipidemia, mixed Tolerating decreased dose of Crestor. Encouraged heart healthy diet, increase exercise, avoid trans fats, consider a krill oil cap daily   Cough Stopped lisinopril and cough improved

## 2015-03-24 NOTE — Assessment & Plan Note (Signed)
Tolerating decreased dose of Crestor. Encouraged heart healthy diet, increase exercise, avoid trans fats, consider a krill oil cap daily

## 2015-03-24 NOTE — Assessment & Plan Note (Signed)
Has made some good changes with diet and exercise, is tolerating increased dose of Metformin, will recheck hgba1c in 3 months and consider further med changes at thtat time

## 2015-03-24 NOTE — Assessment & Plan Note (Signed)
Encouraged DASH diet, decrease po intake and increase exercise as tolerated. Needs 7-8 hours of sleep nightly. Avoid trans fats, eat small, frequent meals every 4-5 hours with lean proteins, complex carbs and healthy fats. Minimize simple carbs, GMO foods. 

## 2015-04-28 ENCOUNTER — Encounter: Payer: Self-pay | Admitting: Gastroenterology

## 2015-05-09 ENCOUNTER — Other Ambulatory Visit: Payer: Self-pay | Admitting: Family Medicine

## 2015-05-09 DIAGNOSIS — E118 Type 2 diabetes mellitus with unspecified complications: Secondary | ICD-10-CM

## 2015-05-09 MED ORDER — GLIMEPIRIDE 4 MG PO TABS: 4.0000 mg | ORAL_TABLET | Freq: Two times a day (BID) | ORAL | Status: DC

## 2015-06-22 ENCOUNTER — Other Ambulatory Visit: Payer: Self-pay | Admitting: Family Medicine

## 2015-06-22 NOTE — Telephone Encounter (Signed)
Pt last labs in March '15, has an appointment for DM Aug '16.

## 2015-07-25 ENCOUNTER — Ambulatory Visit: Payer: Medicare Other | Admitting: Family Medicine

## 2015-07-26 ENCOUNTER — Ambulatory Visit: Payer: Medicare Other | Admitting: Family Medicine

## 2015-08-01 ENCOUNTER — Encounter: Payer: Self-pay | Admitting: Family Medicine

## 2015-08-01 ENCOUNTER — Ambulatory Visit (INDEPENDENT_AMBULATORY_CARE_PROVIDER_SITE_OTHER): Payer: Medicare Other | Admitting: Family Medicine

## 2015-08-01 VITALS — BP 122/80 | HR 77 | Temp 98.8°F | Ht 64.0 in | Wt 167.0 lb

## 2015-08-01 DIAGNOSIS — E782 Mixed hyperlipidemia: Secondary | ICD-10-CM | POA: Diagnosis not present

## 2015-08-01 DIAGNOSIS — E669 Obesity, unspecified: Secondary | ICD-10-CM

## 2015-08-01 DIAGNOSIS — H9190 Unspecified hearing loss, unspecified ear: Secondary | ICD-10-CM

## 2015-08-01 DIAGNOSIS — E119 Type 2 diabetes mellitus without complications: Secondary | ICD-10-CM

## 2015-08-01 DIAGNOSIS — E663 Overweight: Secondary | ICD-10-CM | POA: Diagnosis not present

## 2015-08-01 DIAGNOSIS — E118 Type 2 diabetes mellitus with unspecified complications: Secondary | ICD-10-CM | POA: Diagnosis not present

## 2015-08-01 DIAGNOSIS — I10 Essential (primary) hypertension: Secondary | ICD-10-CM | POA: Diagnosis not present

## 2015-08-01 DIAGNOSIS — E1169 Type 2 diabetes mellitus with other specified complication: Secondary | ICD-10-CM

## 2015-08-01 DIAGNOSIS — L821 Other seborrheic keratosis: Secondary | ICD-10-CM

## 2015-08-01 DIAGNOSIS — Z1239 Encounter for other screening for malignant neoplasm of breast: Secondary | ICD-10-CM

## 2015-08-01 DIAGNOSIS — H918X9 Other specified hearing loss, unspecified ear: Secondary | ICD-10-CM

## 2015-08-01 LAB — LIPID PANEL
Cholesterol: 145 mg/dL (ref 0–200)
HDL: 41.3 mg/dL (ref >39.00)
LDL CALC: 66 mg/dL (ref 0–99)
NonHDL: 103.57
Total CHOL/HDL Ratio: 4
Triglycerides: 189 mg/dL — ABNORMAL HIGH (ref 0.0–149.0)
VLDL: 37.8 mg/dL (ref 0.0–40.0)

## 2015-08-01 LAB — CBC
HEMATOCRIT: 39.1 % (ref 36.0–46.0)
HEMOGLOBIN: 13 g/dL (ref 12.0–15.0)
MCHC: 33.3 g/dL (ref 30.0–36.0)
MCV: 88.3 fl (ref 78.0–100.0)
Platelets: 296 10*3/uL (ref 150.0–400.0)
RBC: 4.43 Mil/uL (ref 3.87–5.11)
RDW: 13.7 % (ref 11.5–15.5)
WBC: 7.4 10*3/uL (ref 4.0–10.5)

## 2015-08-01 LAB — COMPREHENSIVE METABOLIC PANEL
ALBUMIN: 4 g/dL (ref 3.5–5.2)
ALT: 17 U/L (ref 0–35)
AST: 19 U/L (ref 0–37)
Alkaline Phosphatase: 50 U/L (ref 39–117)
BUN: 17 mg/dL (ref 6–23)
CO2: 25 mEq/L (ref 19–32)
Calcium: 9.4 mg/dL (ref 8.4–10.5)
Chloride: 105 mEq/L (ref 96–112)
Creatinine, Ser: 0.57 mg/dL (ref 0.40–1.20)
GFR: 110.18 mL/min (ref >60.00)
Glucose, Bld: 104 mg/dL — ABNORMAL HIGH (ref 70–99)
Potassium: 4 mEq/L (ref 3.5–5.1)
Sodium: 140 mEq/L (ref 135–145)
Total Bilirubin: 0.3 mg/dL (ref 0.2–1.2)
Total Protein: 7 g/dL (ref 6.0–8.3)

## 2015-08-01 LAB — MICROALBUMIN / CREATININE URINE RATIO
Creatinine,U: 115.2 mg/dL
MICROALB/CREAT RATIO: 1.2 mg/g (ref 0.0–30.0)
Microalb, Ur: 1.4 mg/dL (ref 0.0–1.9)

## 2015-08-01 LAB — TSH: TSH: 2.04 u[IU]/mL (ref 0.35–4.50)

## 2015-08-01 LAB — HEMOGLOBIN A1C: Hgb A1c MFr Bld: 7.6 % — ABNORMAL HIGH (ref 4.6–6.5)

## 2015-08-01 MED ORDER — METFORMIN HCL 1000 MG PO TABS: ORAL_TABLET | ORAL | Status: DC

## 2015-08-01 MED ORDER — ROSUVASTATIN CALCIUM 20 MG PO TABS: ORAL_TABLET | ORAL | Status: DC

## 2015-08-01 MED ORDER — RAMIPRIL 2.5 MG PO CAPS: 2.5000 mg | ORAL_CAPSULE | Freq: Every day | ORAL | Status: DC

## 2015-08-01 MED ORDER — GLIMEPIRIDE 4 MG PO TABS: 4.0000 mg | ORAL_TABLET | Freq: Two times a day (BID) | ORAL | Status: DC

## 2015-08-01 NOTE — Assessment & Plan Note (Addendum)
hgba1c unacceptable, minimize simple carbs. Increase exercise as tolerated. Continue current meds, recheck today, FSG in am 91 to 120 and in pm after eating 130 to 160.

## 2015-08-01 NOTE — Assessment & Plan Note (Signed)
Encouraged DASH diet, decrease po intake and increase exercise as tolerated. Needs 7-8 hours of sleep nightly. Avoid trans fats, eat small, frequent meals every 4-5 hours with lean proteins, complex carbs and healthy fats. Minimize simple carbs 

## 2015-08-01 NOTE — Progress Notes (Signed)
Pre visit review using our clinic review tool, if applicable. No additional management support is needed unless otherwise documented below in the visit note. 

## 2015-08-01 NOTE — Assessment & Plan Note (Signed)
Only excessive with fruit intake

## 2015-08-01 NOTE — Patient Instructions (Signed)

## 2015-08-01 NOTE — Assessment & Plan Note (Signed)
Tolerating statin, encouraged heart healthy diet, avoid trans fats, minimize simple carbs and saturated fats. Increase exercise as tolerated 

## 2015-08-01 NOTE — Assessment & Plan Note (Signed)
No concerning lesions, has annual exam with Dr Martinique in November 2016

## 2015-08-02 ENCOUNTER — Other Ambulatory Visit: Payer: Self-pay | Admitting: Internal Medicine

## 2015-08-03 ENCOUNTER — Ambulatory Visit
Admission: RE | Admit: 2015-08-03 | Discharge: 2015-08-03 | Disposition: A | Discharge status: Home / Self Care | Payer: Medicare Other | Source: Ambulatory Visit | Attending: Family Medicine | Admitting: Family Medicine

## 2015-08-03 DIAGNOSIS — Z1231 Encounter for screening mammogram for malignant neoplasm of breast: Secondary | ICD-10-CM | POA: Diagnosis not present

## 2015-08-09 ENCOUNTER — Other Ambulatory Visit: Payer: Self-pay | Admitting: Family Medicine

## 2015-08-11 DIAGNOSIS — H2513 Age-related nuclear cataract, bilateral: Secondary | ICD-10-CM | POA: Diagnosis not present

## 2015-08-11 DIAGNOSIS — E119 Type 2 diabetes mellitus without complications: Secondary | ICD-10-CM | POA: Diagnosis not present

## 2015-08-11 LAB — HM DIABETES EYE EXAM

## 2015-08-14 ENCOUNTER — Encounter: Payer: Self-pay | Admitting: Family Medicine

## 2015-08-14 DIAGNOSIS — H918X9 Other specified hearing loss, unspecified ear: Secondary | ICD-10-CM | POA: Insufficient documentation

## 2015-08-14 DIAGNOSIS — H9190 Unspecified hearing loss, unspecified ear: Secondary | ICD-10-CM

## 2015-08-14 HISTORY — DX: Unspecified hearing loss, unspecified ear: H91.90

## 2015-08-14 NOTE — Assessment & Plan Note (Signed)
Not enough to require hearing aides. Reviewed results brought in by patient

## 2015-08-14 NOTE — Progress Notes (Signed)
Subjective:    Patient ID: Gail Lester, female    DOB: 10/20/1941, 74 y.o.   MRN: [id number]  Chief Complaint  Patient presents with  . Follow-up    HPI Patient is in today for follow up.  Doing well. Had a hearing test which showed only mild hearing loss. No recent illness or acute concerns. Has noted an improvement in her hair since starting Zinc and Biotin. Denies CP/palp/SOB/HA/congestion/fevers/GI or GU c/o. Taking meds as prescribed  Past Medical History  Diagnosis Date  . Diabetes mellitus   . Hypercholesteremia   . Arthritis   . Diabetes   . Chicken pox as a child  . Measles as a child  . Mumps as a child  . Diarrhea 02/10/2013  . Other and unspecified hyperlipidemia 02/10/2013  . Fibrocystic breast 02/10/2013    Left h/o FN biopsy   . Dupuytren's contracture of both hands 02/10/2013  . Other malaise and fatigue 08/04/2013  . Encounter for Medicare annual wellness exam 08/04/2013  . Overweight 06/14/2014  . Ganglion cyst 11/14/2014    Right 2nd finger  . Left arm pain 11/14/2014  . Diabetes mellitus type 2 in obese 02/10/2013    Saw Dr Delman Cheadle, eye doctor in 01/01/13, exam in chart   . Cough 03/24/2015  . Keratosis, seborrheic 08/01/2015  . 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 08/14/2015    Past Surgical History  Procedure Laterality Date  . Appendectomy  1955  . Orif wrist fracture  12/09/2011    Procedure: OPEN REDUCTION INTERNAL FIXATION (ORIF) WRIST FRACTURE;  Surgeon: Linna Hoff;  Location: [place];  Service: Orthopedics;  Laterality: Left;  . Breast biopsy  1985-1990    benign    Family History  Problem Relation Age of Onset  . Colon cancer      grandparent  . Kidney disease Mother   . Diabetes Father   . Kidney disease Maternal Grandmother   . Cancer Maternal Grandmother     bladder  . Cancer Maternal Aunt   . Alzheimer's disease Maternal Aunt     Social History   Social History  . Marital Status: Divorced    Spouse Name: N/A  . Number of Children: N/A    . Years of Education: N/A   Occupational History  . Not on file.   Social History Main Topics  . Smoking status: Former Smoker    Quit date: 11/17/1989  . Smokeless tobacco: Not on file  . Alcohol Use: No  . Drug Use: No  . Sexual Activity: No   Other Topics Concern  . Not on file   Social History Narrative    Outpatient Prescriptions Prior to Visit  Medication Sig Dispense Refill  . ACCU-CHEK SOFTCLIX LANCETS lancets Use as instructed 100 each 0  . aspirin 81 MG tablet Take 81 mg by mouth daily.    Marland Kitchen b complex vitamins tablet Take 1 tablet by mouth as directed. Two times a week    . CALCIUM PO Take 400 mg by mouth daily.    . Cholecalciferol (D-3-5) 5000 UNITS capsule Take 4,000 Units by mouth daily.     . Coenzyme Q10 (COQ10) 100 MG CAPS Take 1 capsule by mouth daily.    Marland Kitchen glucose blood (ACCU-CHEK AVIVA PLUS) test strip Check Blood Sugar bid 300 each 12  . Lutein 20 MG CAPS Take by mouth.    Marland Kitchen MAGNESIUM PO Take 250 mg by mouth daily.     . Multiple Vitamin (MULTIVITAMIN)  tablet Take 1 tablet by mouth as directed. Two times a week    . Probiotic Product (PROBIOTIC DAILY PO) Take 1 capsule by mouth daily.    . Turmeric 500 MG CAPS Take by mouth daily.    . vitamin C (ASCORBIC ACID) 500 MG tablet Take 500 mg by mouth 2 (two) times daily.    . Blood Glucose Monitoring Suppl (ACCU-CHEK AVIVA PLUS) W/DEVICE KIT 1 kit by Does not apply route as directed. Dx: 250.oo 1 kit 0  . CRESTOR 20 MG tablet Take one-half tablet by  mouth daily 45 tablet 1  . glimepiride (AMARYL) 4 MG tablet Take 1 tablet (4 mg total) by mouth 2 (two) times daily. Dx Code: 250.00 180 tablet 1  . metFORMIN (GLUCOPHAGE) 1000 MG tablet Take 1 tablet by mouth  twice a day with meals 180 tablet 0   No facility-administered medications prior to visit.    Allergies  Allergen Reactions  . Lipitor [Atorvastatin]     Diarrhea, muscle cramps  . Lisinopril     myalgias    Review of Systems  Constitutional:  Negative for fever and malaise/fatigue.  HENT: Negative for congestion.   Eyes: Negative for discharge.  Respiratory: Negative for shortness of breath.   Cardiovascular: Negative for chest pain, palpitations and leg swelling.  Gastrointestinal: Negative for nausea and abdominal pain.  Genitourinary: Negative for dysuria.  Musculoskeletal: Negative for falls.  Skin: Negative for rash.  Neurological: Negative for loss of consciousness and headaches.  Endo/Heme/Allergies: Negative for environmental allergies.  Psychiatric/Behavioral: Negative for depression. The patient is not nervous/anxious.        Objective:    Physical Exam  Constitutional: She is oriented to person, place, and time. She appears well-developed and well-nourished. No distress.  HENT:  Head: Normocephalic and atraumatic.  Nose: Nose normal.  Eyes: Right eye exhibits no discharge. Left eye exhibits no discharge.  Neck: Normal range of motion. Neck supple.  Cardiovascular: Normal rate and regular rhythm.   No murmur heard. Pulmonary/Chest: Effort normal and breath sounds normal.  Abdominal: Soft. Bowel sounds are normal. There is no tenderness.  Musculoskeletal: She exhibits no edema.  Neurological: She is alert and oriented to person, place, and time.  Skin: Skin is warm and dry.  Psychiatric: She has a normal mood and affect.  Nursing note and vitals reviewed.   BP 122/80 mmHg  Pulse 77  Temp(Src) 98.8 F (37.1 C) (Oral)  Ht '5\' 4"'  (1.626 m)  Wt 167 lb (75.751 kg)  BMI 28.65 kg/m2  SpO2 97% Wt Readings from Last 3 Encounters:  08/01/15 167 lb (75.751 kg)  03/11/15 164 lb (74.39 kg)  11/24/14 176 lb (79.833 kg)     Lab Results  Component Value Date   WBC 7.4 08/01/2015   HGB 13.0 08/01/2015   HCT 39.1 08/01/2015   PLT 296.0 08/01/2015   GLUCOSE 104* 08/01/2015   CHOL 145 08/01/2015   TRIG 189.0* 08/01/2015   HDL 41.30 08/01/2015   LDLDIRECT 109.7 11/24/2012   LDLCALC 66 08/01/2015   ALT 17  08/01/2015   AST 19 08/01/2015   NA 140 08/01/2015   K 4.0 08/01/2015   CL 105 08/01/2015   CREATININE 0.57 08/01/2015   BUN 17 08/01/2015   CO2 25 08/01/2015   TSH 2.04 08/01/2015   HGBA1C 7.6* 08/01/2015   MICROALBUR 1.4 08/01/2015    Lab Results  Component Value Date   TSH 2.04 08/01/2015   Lab Results  Component Value Date  WBC 7.4 08/01/2015   HGB 13.0 08/01/2015   HCT 39.1 08/01/2015   MCV 88.3 08/01/2015   PLT 296.0 08/01/2015   Lab Results  Component Value Date   NA 140 08/01/2015   K 4.0 08/01/2015   CO2 25 08/01/2015   GLUCOSE 104* 08/01/2015   BUN 17 08/01/2015   CREATININE 0.57 08/01/2015   BILITOT 0.3 08/01/2015   ALKPHOS 50 08/01/2015   AST 19 08/01/2015   ALT 17 08/01/2015   PROT 7.0 08/01/2015   ALBUMIN 4.0 08/01/2015   CALCIUM 9.4 08/01/2015   GFR 110.18 08/01/2015   Lab Results  Component Value Date   CHOL 145 08/01/2015   Lab Results  Component Value Date   HDL 41.30 08/01/2015   Lab Results  Component Value Date   LDLCALC 66 08/01/2015   Lab Results  Component Value Date   TRIG 189.0* 08/01/2015   Lab Results  Component Value Date   CHOLHDL 4 08/01/2015   Lab Results  Component Value Date   HGBA1C 7.6* 08/01/2015       Assessment & Plan:   Problem List Items Addressed This Visit    Overweight    Encouraged DASH diet, decrease po intake and increase exercise as tolerated. Needs 7-8 hours of sleep nightly. Avoid trans fats, eat small, frequent meals every 4-5 hours with lean proteins, complex carbs and healthy fats. Minimize simple carbs      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    Not enough to require hearing aides. Reviewed results brought in by patient      Keratosis, seborrheic    No concerning lesions, has annual exam with Dr Martinique in November 2016      Hyperlipidemia, mixed - Primary    Tolerating statin, encouraged heart healthy diet, avoid trans fats, minimize simple carbs and saturated fats. Increase exercise  as tolerated      Relevant Medications   rosuvastatin (CRESTOR) 20 MG tablet   ramipril (ALTACE) 2.5 MG capsule   Other Relevant Orders   Lipid panel   Microalbumin / creatinine urine ratio (Completed)   Diabetes mellitus type 2 in obese    hgba1c unacceptable, minimize simple carbs. Increase exercise as tolerated. Continue current meds, recheck today, FSG in am 91 to 120 and in pm after eating 130 to 160.      Relevant Medications   rosuvastatin (CRESTOR) 20 MG tablet   glimepiride (AMARYL) 4 MG tablet   metFORMIN (GLUCOPHAGE) 1000 MG tablet   ramipril (ALTACE) 2.5 MG capsule    Other Visit Diagnoses    Type 2 diabetes mellitus with complication        Relevant Medications    rosuvastatin (CRESTOR) 20 MG tablet    glimepiride (AMARYL) 4 MG tablet    metFORMIN (GLUCOPHAGE) 1000 MG tablet    ramipril (ALTACE) 2.5 MG capsule    Other Relevant Orders    Hemoglobin A1c    Microalbumin / creatinine urine ratio (Completed)    Essential hypertension        Relevant Medications    rosuvastatin (CRESTOR) 20 MG tablet    ramipril (ALTACE) 2.5 MG capsule    Other Relevant Orders    CBC    Comprehensive metabolic panel    Microalbumin / creatinine urine ratio (Completed)    TSH    Breast cancer screening           I have changed Ms. Bertini's CRESTOR to rosuvastatin. I am also having her start on ramipril. Additionally,  I am having her maintain her aspirin, Cholecalciferol, Lutein, b complex vitamins, multivitamin, MAGNESIUM PO, CALCIUM PO, vitamin C, glucose blood, Probiotic Product (PROBIOTIC DAILY PO), CoQ10, Turmeric, ACCU-CHEK SOFTCLIX LANCETS, glimepiride, and metFORMIN.  Meds ordered this encounter  Medications  . rosuvastatin (CRESTOR) 20 MG tablet    Sig: Take one-half tablet by  mouth daily    Dispense:  45 tablet    Refill:  2  . glimepiride (AMARYL) 4 MG tablet    Sig: Take 1 tablet (4 mg total) by mouth 2 (two) times daily. Dx Code: 250.00    Dispense:  180 tablet      Refill:  2  . metFORMIN (GLUCOPHAGE) 1000 MG tablet    Sig: Take 1 tablet by mouth  twice a day with meals    Dispense:  180 tablet    Refill:  2  . ramipril (ALTACE) 2.5 MG capsule    Sig: Take 1 capsule (2.5 mg total) by mouth daily.    Dispense:  90 capsule    Refill:  1    Intolerance to lisinopril (myalgia)     Penni Homans, MD

## 2015-08-19 ENCOUNTER — Encounter: Payer: Self-pay | Admitting: Family Medicine

## 2015-08-19 ENCOUNTER — Other Ambulatory Visit: Payer: Self-pay | Admitting: Family Medicine

## 2015-08-19 MED ORDER — GLUCOSE BLOOD VI STRP: ORAL_STRIP | Status: DC

## 2015-09-21 ENCOUNTER — Ambulatory Visit (INDEPENDENT_AMBULATORY_CARE_PROVIDER_SITE_OTHER): Payer: Medicare Other

## 2015-09-21 DIAGNOSIS — Z23 Encounter for immunization: Secondary | ICD-10-CM | POA: Diagnosis not present

## 2015-10-03 ENCOUNTER — Other Ambulatory Visit: Payer: Self-pay | Admitting: Family Medicine

## 2015-10-24 ENCOUNTER — Ambulatory Visit (INDEPENDENT_AMBULATORY_CARE_PROVIDER_SITE_OTHER): Payer: Medicare Other | Admitting: Family Medicine

## 2015-10-24 ENCOUNTER — Encounter: Payer: Self-pay | Admitting: Family Medicine

## 2015-10-24 VITALS — BP 108/70 | HR 75 | Temp 98.4°F | Ht 64.0 in | Wt 162.0 lb

## 2015-10-24 DIAGNOSIS — E782 Mixed hyperlipidemia: Secondary | ICD-10-CM

## 2015-10-24 DIAGNOSIS — E663 Overweight: Secondary | ICD-10-CM | POA: Diagnosis not present

## 2015-10-24 DIAGNOSIS — Z0000 Encounter for general adult medical examination without abnormal findings: Secondary | ICD-10-CM

## 2015-10-24 DIAGNOSIS — M8580 Other specified disorders of bone density and structure, unspecified site: Secondary | ICD-10-CM

## 2015-10-24 DIAGNOSIS — E1169 Type 2 diabetes mellitus with other specified complication: Secondary | ICD-10-CM

## 2015-10-24 DIAGNOSIS — R197 Diarrhea, unspecified: Secondary | ICD-10-CM

## 2015-10-24 DIAGNOSIS — E669 Obesity, unspecified: Secondary | ICD-10-CM

## 2015-10-24 DIAGNOSIS — Z780 Asymptomatic menopausal state: Secondary | ICD-10-CM

## 2015-10-24 DIAGNOSIS — E119 Type 2 diabetes mellitus without complications: Secondary | ICD-10-CM | POA: Diagnosis not present

## 2015-10-24 MED ORDER — ACCU-CHEK SOFTCLIX LANCETS MISC: Status: DC

## 2015-10-24 MED ORDER — METFORMIN HCL ER 500 MG PO TB24: ORAL_TABLET | ORAL | Status: DC

## 2015-10-24 NOTE — Progress Notes (Signed)
Pre visit review using our clinic review tool, if applicable. No additional management support is needed unless otherwise documented below in the visit note. 

## 2015-10-24 NOTE — Progress Notes (Deleted)
Gail Lester [id number] Oct 22, 1941 10/24/2015      Progress Note New Patient  Subjective  Chief Complaint  Chief Complaint  Patient presents with  . Annual Exam    HPI  ***  Past Medical History  Diagnosis Date  . Diabetes mellitus   . Hypercholesteremia   . Arthritis   . Diabetes ([place])   . Chicken pox as a child  . Measles as a child  . Mumps as a child  . Diarrhea 02/10/2013  . Other and unspecified hyperlipidemia 02/10/2013  . Fibrocystic breast 02/10/2013    Left h/o FN biopsy   . Dupuytren's contracture of both hands 02/10/2013  . Other malaise and fatigue 08/04/2013  . Encounter for Medicare annual wellness exam 08/04/2013  . Overweight 06/14/2014  . Ganglion cyst 11/14/2014    Right 2nd finger  . Left arm pain 11/14/2014  . Diabetes mellitus type 2 in obese [facility]) 02/10/2013    Saw Dr Delman Cheadle, eye doctor in 01/01/13, exam in chart   . Cough 03/24/2015  . Keratosis, seborrheic 08/01/2015  . 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 08/14/2015    Past Surgical History  Procedure Laterality Date  . Appendectomy  1955  . Orif wrist fracture  12/09/2011    Procedure: OPEN REDUCTION INTERNAL FIXATION (ORIF) WRIST FRACTURE;  Surgeon: Linna Hoff;  Location: [place];  Service: Orthopedics;  Laterality: Left;  . Breast biopsy  1985-1990    benign    Family History  Problem Relation Age of Onset  . Colon cancer      grandparent  . Kidney disease Mother   . Diabetes Father   . Kidney disease Maternal Grandmother   . Cancer Maternal Grandmother     bladder  . Cancer Maternal Aunt   . Alzheimer's disease Maternal Aunt   . Scoliosis Son     Social History   Social History  . Marital Status: Divorced    Spouse Name: N/A  . Number of Children: N/A  . Years of Education: N/A   Occupational History  . Not on file.   Social History Main Topics  . Smoking status: Former Smoker    Quit date: 11/17/1989  . Smokeless tobacco: Not on file  . Alcohol Use: No  . Drug Use: No  .  Sexual Activity: No   Other Topics Concern  . Not on file   Social History Narrative    Current Outpatient Prescriptions on File Prior to Visit  Medication Sig Dispense Refill  . aspirin 81 MG tablet Take 81 mg by mouth daily.    Marland Kitchen b complex vitamins tablet Take 1 tablet by mouth as directed. Two times a week    . Blood Glucose Monitoring Suppl (ACCU-CHEK AVIVA PLUS) W/DEVICE KIT Use as directed 1 kit 0  . CALCIUM PO Take 400 mg by mouth daily.    . Cholecalciferol (D-3-5) 5000 UNITS capsule Take 4,000 Units by mouth daily.     . Coenzyme Q10 (COQ10) 100 MG CAPS Take 1 capsule by mouth daily.    Marland Kitchen glimepiride (AMARYL) 4 MG tablet Take 1 tablet (4 mg total) by mouth 2 (two) times daily. Dx Code: 250.00 180 tablet 2  . glucose blood (ACCU-CHEK AVIVA PLUS) test strip Check Blood Sugar bid 300 each 12  . Lutein 20 MG CAPS Take by mouth.    Marland Kitchen MAGNESIUM PO Take 250 mg by mouth daily.     . Multiple Vitamin (MULTIVITAMIN) tablet Take 1 tablet by mouth as  directed. Two times a week    . Probiotic Product (PROBIOTIC DAILY PO) Take 1 capsule by mouth daily.    . ramipril (ALTACE) 2.5 MG capsule Take 1 capsule (2.5 mg total) by mouth daily. 90 capsule 1  . rosuvastatin (CRESTOR) 20 MG tablet Take one-half tablet by  mouth daily 45 tablet 2  . Turmeric 500 MG CAPS Take by mouth daily.    . vitamin C (ASCORBIC ACID) 500 MG tablet Take 500 mg by mouth 2 (two) times daily.     No current facility-administered medications on file prior to visit.    Allergies  Allergen Reactions  . Lipitor [Atorvastatin]     Diarrhea, muscle cramps  . Lisinopril     myalgias    Review of Systems  ***  Objective  BP 108/70 mmHg  Pulse 75  Temp(Src) 98.4 F (36.9 C) (Oral)  Ht _0  (1.626 m)  Wt 162 lb (73.483 kg)  BMI 27.79 kg/m2  SpO2 96%  Physical Exam  ***     Assessment & Plan

## 2015-10-24 NOTE — Patient Instructions (Signed)
Add fiber supplements twice daily such as Benefiber or Metamucil Try changing probiotic, consider [employer], [email or web address]. 10 strain probiotic 1 cap daily  Call if you decide you are willing to accept referrral to gastroenterology  Preventive Care for Adults, Female A healthy lifestyle and preventive care can promote health and wellness. Preventive health guidelines for women include the following key practices.  A routine yearly physical is a good way to check with your health care provider about your health and preventive screening. It is a chance to share any concerns and updates on your health and to receive a thorough exam.  Visit your dentist for a routine exam and preventive care every 6 months. Brush your teeth twice a day and floss once a day. Good oral hygiene prevents tooth decay and gum disease.  The frequency of eye exams is based on your age, health, family medical history, use of contact lenses, and other factors. Follow your health care provider's recommendations for frequency of eye exams.  Eat a healthy diet. Foods like vegetables, fruits, whole grains, low-fat dairy products, and lean protein foods contain the nutrients you need without too many calories. Decrease your intake of foods high in solid fats, added sugars, and salt. Eat the right amount of calories for you.Get information about a proper diet from your health care provider, if necessary.  Regular physical exercise is one of the most important things you can do for your health. Most adults should get at least 150 minutes of moderate-intensity exercise (any activity that increases your heart rate and causes you to sweat) each week. In addition, most adults need muscle-strengthening exercises on 2 or more days a week.  Maintain a healthy weight. The body mass index (BMI) is a screening tool to identify possible weight problems. It provides an estimate of body fat based on height and weight.  Your health care provider can find your BMI and can help you achieve or maintain a healthy weight.For adults 20 years and older:  A BMI below 18.5 is considered underweight.  A BMI of 18.5 to 24.9 is normal.  A BMI of 25 to 29.9 is considered overweight.  A BMI of 30 and above is considered obese.  Maintain normal blood lipids and cholesterol levels by exercising and minimizing your intake of saturated fat. Eat a balanced diet with plenty of fruit and vegetables. Blood tests for lipids and cholesterol should begin at age 51 and be repeated every 5 years. If your lipid or cholesterol levels are high, you are over 50, or you are at high risk for heart disease, you may need your cholesterol levels checked more frequently.Ongoing high lipid and cholesterol levels should be treated with medicines if diet and exercise are not working.  If you smoke, find out from your health care provider how to quit. If you do not use tobacco, do not start.  Lung cancer screening is recommended for adults aged 65-80 years who are at high risk for developing lung cancer because of a history of smoking. A yearly low-dose CT scan of the lungs is recommended for people who have at least a 30-pack-year history of smoking and are a current smoker or have quit within the past 15 years. A pack year of smoking is smoking an average of 1 pack of cigarettes a day for 1 year (for example: 1 pack a day for 30 years or 2 packs a day for 15 years). Yearly screening should continue until the smoker has  stopped smoking for at least 15 years. Yearly screening should be stopped for people who develop a health problem that would prevent them from having lung cancer treatment.  If you are pregnant, do not drink alcohol. If you are breastfeeding, be very cautious about drinking alcohol. If you are not pregnant and choose to drink alcohol, do not have more than 1 drink per day. One drink is considered to be 12 ounces (355 mL) of beer, 5  ounces (148 mL) of wine, or 1.5 ounces (44 mL) of liquor.  Avoid use of street drugs. Do not share needles with anyone. Ask for help if you need support or instructions about stopping the use of drugs.  High blood pressure causes heart disease and increases the risk of stroke. Your blood pressure should be checked at least every 1 to 2 years. Ongoing high blood pressure should be treated with medicines if weight loss and exercise do not work.  If you are 66-88 years old, ask your health care provider if you should take aspirin to prevent strokes.  Diabetes screening is done by taking a blood sample to check your blood glucose level after you have not eaten for a certain period of time (fasting). If you are not overweight and you do not have risk factors for diabetes, you should be screened once every 3 years starting at age 11. If you are overweight or obese and you are 65-13 years of age, you should be screened for diabetes every year as part of your cardiovascular risk assessment.  Breast cancer screening is essential preventive care for women. You should practice "breast self-awareness." This means understanding the normal appearance and feel of your breasts and may include breast self-examination. Any changes detected, no matter how small, should be reported to a health care provider. Women in their 30s and 30s should have a clinical breast exam (CBE) by a health care provider as part of a regular health exam every 1 to 3 years. After age 54, women should have a CBE every year. Starting at age 93, women should consider having a mammogram (breast X-ray test) every year. Women who have a family history of breast cancer should talk to their health care provider about genetic screening. Women at a high risk of breast cancer should talk to their health care providers about having an MRI and a mammogram every year.  Breast cancer gene (BRCA)-related cancer risk assessment is recommended for women who have  family members with BRCA-related cancers. BRCA-related cancers include breast, ovarian, tubal, and peritoneal cancers. Having family members with these cancers may be associated with an increased risk for harmful changes (mutations) in the breast cancer genes BRCA1 and BRCA2. Results of the assessment will determine the need for genetic counseling and BRCA1 and BRCA2 testing.  Your health care provider may recommend that you be screened regularly for cancer of the pelvic organs (ovaries, uterus, and vagina). This screening involves a pelvic examination, including checking for microscopic changes to the surface of your cervix (Pap test). You may be encouraged to have this screening done every 3 years, beginning at age 62.  For women ages 41-65, health care providers may recommend pelvic exams and Pap testing every 3 years, or they may recommend the Pap and pelvic exam, combined with testing for human papilloma virus (HPV), every 5 years. Some types of HPV increase your risk of cervical cancer. Testing for HPV may also be done on women of any age with unclear Pap test results.  Other health care providers may not recommend any screening for nonpregnant women who are considered low risk for pelvic cancer and who do not have symptoms. Ask your health care provider if a screening pelvic exam is right for you.  If you have had past treatment for cervical cancer or a condition that could lead to cancer, you need Pap tests and screening for cancer for at least 20 years after your treatment. If Pap tests have been discontinued, your risk factors (such as having a new sexual partner) need to be reassessed to determine if screening should resume. Some women have medical problems that increase the chance of getting cervical cancer. In these cases, your health care provider may recommend more frequent screening and Pap tests.  Colorectal cancer can be detected and often prevented. Most routine colorectal cancer  screening begins at the age of 17 years and continues through age 39 years. However, your health care provider may recommend screening at an earlier age if you have risk factors for colon cancer. On a yearly basis, your health care provider may provide home test kits to check for hidden blood in the stool. Use of a small camera at the end of a tube, to directly examine the colon (sigmoidoscopy or colonoscopy), can detect the earliest forms of colorectal cancer. Talk to your health care provider about this at age 71, when routine screening begins. Direct exam of the colon should be repeated every 5-10 years through age 33 years, unless early forms of precancerous polyps or small growths are found.  People who are at an increased risk for hepatitis B should be screened for this virus. You are considered at high risk for hepatitis B if:  You were born in a country where hepatitis B occurs often. Talk with your health care provider about which countries are considered high risk.  Your parents were born in a high-risk country and you have not received a shot to protect against hepatitis B (hepatitis B vaccine).  You have HIV or AIDS.  You use needles to inject street drugs.  You live with, or have sex with, someone who has hepatitis B.  You get hemodialysis treatment.  You take certain medicines for conditions like cancer, organ transplantation, and autoimmune conditions.  Hepatitis C blood testing is recommended for all people born from [birth year] through 1965 and any individual with known risks for hepatitis C.  Practice safe sex. Use condoms and avoid high-risk sexual practices to reduce the spread of sexually transmitted infections (STIs). STIs include gonorrhea, chlamydia, syphilis, trichomonas, herpes, HPV, and human immunodeficiency virus (HIV). Herpes, HIV, and HPV are viral illnesses that have no cure. They can result in disability, cancer, and death.  You should be screened for sexually  transmitted illnesses (STIs) including gonorrhea and chlamydia if:  You are sexually active and are younger than 24 years.  You are older than 24 years and your health care provider tells you that you are at risk for this type of infection.  Your sexual activity has changed since you were last screened and you are at an increased risk for chlamydia or gonorrhea. Ask your health care provider if you are at risk.  If you are at risk of being infected with HIV, it is recommended that you take a prescription medicine daily to prevent HIV infection. This is called preexposure prophylaxis (PrEP). You are considered at risk if:  You are sexually active and do not regularly use condoms or know the HIV status of  your partner(s).  You take drugs by injection.  You are sexually active with a partner who has HIV.  Talk with your health care provider about whether you are at high risk of being infected with HIV. If you choose to begin PrEP, you should first be tested for HIV. You should then be tested every 3 months for as long as you are taking PrEP.  Osteoporosis is a disease in which the bones lose minerals and strength with aging. This can result in serious bone fractures or breaks. The risk of osteoporosis can be identified using a bone density scan. Women ages 63 years and over and women at risk for fractures or osteoporosis should discuss screening with their health care providers. Ask your health care provider whether you should take a calcium supplement or vitamin D to reduce the rate of osteoporosis.  Menopause can be associated with physical symptoms and risks. Hormone replacement therapy is available to decrease symptoms and risks. You should talk to your health care provider about whether hormone replacement therapy is right for you.  Use sunscreen. Apply sunscreen liberally and repeatedly throughout the day. You should seek shade when your shadow is shorter than you. Protect yourself by  wearing long sleeves, pants, a wide-brimmed hat, and sunglasses year round, whenever you are outdoors.  Once a month, do a whole body skin exam, using a mirror to look at the skin on your back. Tell your health care provider of new moles, moles that have irregular borders, moles that are larger than a pencil eraser, or moles that have changed in shape or color.  Stay current with required vaccines (immunizations).  Influenza vaccine. All adults should be immunized every year.  Tetanus, diphtheria, and acellular pertussis (Td, Tdap) vaccine. Pregnant women should receive 1 dose of Tdap vaccine during each pregnancy. The dose should be obtained regardless of the length of time since the last dose. Immunization is preferred during the 27th-36th week of gestation. An adult who has not previously received Tdap or who does not know her vaccine status should receive 1 dose of Tdap. This initial dose should be followed by tetanus and diphtheria toxoids (Td) booster doses every 10 years. Adults with an unknown or incomplete history of completing a 3-dose immunization series with Td-containing vaccines should begin or complete a primary immunization series including a Tdap dose. Adults should receive a Td booster every 10 years.  Varicella vaccine. An adult without evidence of immunity to varicella should receive 2 doses or a second dose if she has previously received 1 dose. Pregnant females who do not have evidence of immunity should receive the first dose after pregnancy. This first dose should be obtained before leaving the health care facility. The second dose should be obtained 4-8 weeks after the first dose.  Human papillomavirus (HPV) vaccine. Females aged 13-26 years who have not received the vaccine previously should obtain the 3-dose series. The vaccine is not recommended for use in pregnant females. However, pregnancy testing is not needed before receiving a dose. If a female is found to be pregnant  after receiving a dose, no treatment is needed. In that case, the remaining doses should be delayed until after the pregnancy. Immunization is recommended for any person with an immunocompromised condition through the age of 58 years if she did not get any or all doses earlier. During the 3-dose series, the second dose should be obtained 4-8 weeks after the first dose. The third dose should be obtained 24 weeks  after the first dose and 16 weeks after the second dose.  Zoster vaccine. One dose is recommended for adults aged 17 years or older unless certain conditions are present.  Measles, mumps, and rubella (MMR) vaccine. Adults born before [birth year] generally are considered immune to measles and mumps. Adults born in [birth year] or later should have 1 or more doses of MMR vaccine unless there is a contraindication to the vaccine or there is laboratory evidence of immunity to each of the three diseases. A routine second dose of MMR vaccine should be obtained at least 28 days after the first dose for students attending postsecondary schools, health care workers, or international travelers. People who received inactivated measles vaccine or an unknown type of measles vaccine during 1963-1967 should receive 2 doses of MMR vaccine. People who received inactivated mumps vaccine or an unknown type of mumps vaccine before 1979 and are at high risk for mumps infection should consider immunization with 2 doses of MMR vaccine. For females of childbearing age, rubella immunity should be determined. If there is no evidence of immunity, females who are not pregnant should be vaccinated. If there is no evidence of immunity, females who are pregnant should delay immunization until after pregnancy. Unvaccinated health care workers born before [birth year] who lack laboratory evidence of measles, mumps, or rubella immunity or laboratory confirmation of disease should consider measles and mumps immunization with 2 doses of MMR vaccine or rubella  immunization with 1 dose of MMR vaccine.  Pneumococcal 13-valent conjugate (PCV13) vaccine. When indicated, a person who is uncertain of his immunization history and has no record of immunization should receive the PCV13 vaccine. All adults 3 years of age and older should receive this vaccine. An adult aged 7 years or older who has certain medical conditions and has not been previously immunized should receive 1 dose of PCV13 vaccine. This PCV13 should be followed with a dose of pneumococcal polysaccharide (PPSV23) vaccine. Adults who are at high risk for pneumococcal disease should obtain the PPSV23 vaccine at least 8 weeks after the dose of PCV13 vaccine. Adults older than 74 years of age who have normal immune system function should obtain the PPSV23 vaccine dose at least 1 year after the dose of PCV13 vaccine.  Pneumococcal polysaccharide (PPSV23) vaccine. When PCV13 is also indicated, PCV13 should be obtained first. All adults aged 62 years and older should be immunized. An adult younger than age 30 years who has certain medical conditions should be immunized. Any person who resides in a nursing home or long-term care facility should be immunized. An adult smoker should be immunized. People with an immunocompromised condition and certain other conditions should receive both PCV13 and PPSV23 vaccines. People with human immunodeficiency virus (HIV) infection should be immunized as soon as possible after diagnosis. Immunization during chemotherapy or radiation therapy should be avoided. Routine use of PPSV23 vaccine is not recommended for American Indians, [place] Natives, or people younger than 65 years unless there are medical conditions that require PPSV23 vaccine. When indicated, people who have unknown immunization and have no record of immunization should receive PPSV23 vaccine. One-time revaccination 5 years after the first dose of PPSV23 is recommended for people aged 19-64 years who have chronic  kidney failure, nephrotic syndrome, asplenia, or immunocompromised conditions. People who received 1-2 doses of PPSV23 before age 95 years should receive another dose of PPSV23 vaccine at age 32 years or later if at least 5 years have passed since the previous dose. Doses of PPSV23 are  not needed for people immunized with PPSV23 at or after age 76 years.  Meningococcal vaccine. Adults with asplenia or persistent complement component deficiencies should receive 2 doses of quadrivalent meningococcal conjugate (MenACWY-D) vaccine. The doses should be obtained at least 2 months apart. Microbiologists working with certain meningococcal bacteria, [place] recruits, people at risk during an outbreak, and people who travel to or live in countries with a high rate of meningitis should be immunized. A first-year college student up through age 64 years who is living in a residence hall should receive a dose if she did not receive a dose on or after her 16th birthday. Adults who have certain high-risk conditions should receive one or more doses of vaccine.  Hepatitis A vaccine. Adults who wish to be protected from this disease, have certain high-risk conditions, work with hepatitis A-infected animals, work in hepatitis A research labs, or travel to or work in countries with a high rate of hepatitis A should be immunized. Adults who were previously unvaccinated and who anticipate close contact with an international adoptee during the first 60 days after arrival in the [place] from a country with a high rate of hepatitis A should be immunized.  Hepatitis B vaccine. Adults who wish to be protected from this disease, have certain high-risk conditions, may be exposed to blood or other infectious body fluids, are household contacts or sex partners of hepatitis B positive people, are clients or workers in certain care facilities, or travel to or work in countries with a high rate of hepatitis B should be  immunized.  Haemophilus influenzae type b (Hib) vaccine. A previously unvaccinated person with asplenia or sickle cell disease or having a scheduled splenectomy should receive 1 dose of Hib vaccine. Regardless of previous immunization, a recipient of a hematopoietic stem cell transplant should receive a 3-dose series 6-12 months after her successful transplant. Hib vaccine is not recommended for adults with HIV infection. Preventive Services / Frequency Ages 47 to 63 years  Blood pressure check.** / Every 3-5 years.  Lipid and cholesterol check.** / Every 5 years beginning at age 65.  Clinical breast exam.** / Every 3 years for women in their 54s and 62s.  BRCA-related cancer risk assessment.** / For women who have family members with a BRCA-related cancer (breast, ovarian, tubal, or peritoneal cancers).  Pap test.** / Every 2 years from ages 81 through 77. Every 3 years starting at age 56 through age 54 or 84 with a history of 3 consecutive normal Pap tests.  HPV screening.** / Every 3 years from ages 37 through ages 51 to 31 with a history of 3 consecutive normal Pap tests.  Hepatitis C blood test.** / For any individual with known risks for hepatitis C.  Skin self-exam. / Monthly.  Influenza vaccine. / Every year.  Tetanus, diphtheria, and acellular pertussis (Tdap, Td) vaccine.** / Consult your health care provider. Pregnant women should receive 1 dose of Tdap vaccine during each pregnancy. 1 dose of Td every 10 years.  Varicella vaccine.** / Consult your health care provider. Pregnant females who do not have evidence of immunity should receive the first dose after pregnancy.  HPV vaccine. / 3 doses over 6 months, if 26 and younger. The vaccine is not recommended for use in pregnant females. However, pregnancy testing is not needed before receiving a dose.  Measles, mumps, rubella (MMR) vaccine.** / You need at least 1 dose of MMR if you were born in 1957 or later. You may also  need  a 2nd dose. For females of childbearing age, rubella immunity should be determined. If there is no evidence of immunity, females who are not pregnant should be vaccinated. If there is no evidence of immunity, females who are pregnant should delay immunization until after pregnancy.  Pneumococcal 13-valent conjugate (PCV13) vaccine.** / Consult your health care provider.  Pneumococcal polysaccharide (PPSV23) vaccine.** / 1 to 2 doses if you smoke cigarettes or if you have certain conditions.  Meningococcal vaccine.** / 1 dose if you are age 54 to 40 years and a Market researcher living in a residence hall, or have one of several medical conditions, you need to get vaccinated against meningococcal disease. You may also need additional booster doses.  Hepatitis A vaccine.** / Consult your health care provider.  Hepatitis B vaccine.** / Consult your health care provider.  Haemophilus influenzae type b (Hib) vaccine.** / Consult your health care provider. Ages 62 to 65 years  Blood pressure check.** / Every year.  Lipid and cholesterol check.** / Every 5 years beginning at age 88 years.  Lung cancer screening. / Every year if you are aged 45-80 years and have a 30-pack-year history of smoking and currently smoke or have quit within the past 15 years. Yearly screening is stopped once you have quit smoking for at least 15 years or develop a health problem that would prevent you from having lung cancer treatment.  Clinical breast exam.** / Every year after age 23 years.  BRCA-related cancer risk assessment.** / For women who have family members with a BRCA-related cancer (breast, ovarian, tubal, or peritoneal cancers).  Mammogram.** / Every year beginning at age 71 years and continuing for as long as you are in good health. Consult with your health care provider.  Pap test.** / Every 3 years starting at age 21 years through age 87 or 39 years with a history of 3 consecutive normal Pap  tests.  HPV screening.** / Every 3 years from ages 75 years through ages 24 to 42 years with a history of 3 consecutive normal Pap tests.  Fecal occult blood test (FOBT) of stool. / Every year beginning at age 39 years and continuing until age 57 years. You may not need to do this test if you get a colonoscopy every 10 years.  Flexible sigmoidoscopy or colonoscopy.** / Every 5 years for a flexible sigmoidoscopy or every 10 years for a colonoscopy beginning at age 40 years and continuing until age 30 years.  Hepatitis C blood test.** / For all people born from [birth year] through 1965 and any individual with known risks for hepatitis C.  Skin self-exam. / Monthly.  Influenza vaccine. / Every year.  Tetanus, diphtheria, and acellular pertussis (Tdap/Td) vaccine.** / Consult your health care provider. Pregnant women should receive 1 dose of Tdap vaccine during each pregnancy. 1 dose of Td every 10 years.  Varicella vaccine.** / Consult your health care provider. Pregnant females who do not have evidence of immunity should receive the first dose after pregnancy.  Zoster vaccine.** / 1 dose for adults aged 24 years or older.  Measles, mumps, rubella (MMR) vaccine.** / You need at least 1 dose of MMR if you were born in 1957 or later. You may also need a second dose. For females of childbearing age, rubella immunity should be determined. If there is no evidence of immunity, females who are not pregnant should be vaccinated. If there is no evidence of immunity, females who are pregnant should delay immunization  until after pregnancy.  Pneumococcal 13-valent conjugate (PCV13) vaccine.** / Consult your health care provider.  Pneumococcal polysaccharide (PPSV23) vaccine.** / 1 to 2 doses if you smoke cigarettes or if you have certain conditions.  Meningococcal vaccine.** / Consult your health care provider.  Hepatitis A vaccine.** / Consult your health care provider.  Hepatitis B vaccine.** / Consult  your health care provider.  Haemophilus influenzae type b (Hib) vaccine.** / Consult your health care provider. Ages 47 years and over  Blood pressure check.** / Every year.  Lipid and cholesterol check.** / Every 5 years beginning at age 31 years.  Lung cancer screening. / Every year if you are aged 59-80 years and have a 30-pack-year history of smoking and currently smoke or have quit within the past 15 years. Yearly screening is stopped once you have quit smoking for at least 15 years or develop a health problem that would prevent you from having lung cancer treatment.  Clinical breast exam.** / Every year after age 34 years.  BRCA-related cancer risk assessment.** / For women who have family members with a BRCA-related cancer (breast, ovarian, tubal, or peritoneal cancers).  Mammogram.** / Every year beginning at age 4 years and continuing for as long as you are in good health. Consult with your health care provider.  Pap test.** / Every 3 years starting at age 51 years through age 58 or 57 years with 3 consecutive normal Pap tests. Testing can be stopped between 65 and 70 years with 3 consecutive normal Pap tests and no abnormal Pap or HPV tests in the past 10 years.  HPV screening.** / Every 3 years from ages 53 years through ages 30 or 73 years with a history of 3 consecutive normal Pap tests. Testing can be stopped between 65 and 70 years with 3 consecutive normal Pap tests and no abnormal Pap or HPV tests in the past 10 years.  Fecal occult blood test (FOBT) of stool. / Every year beginning at age 63 years and continuing until age 5 years. You may not need to do this test if you get a colonoscopy every 10 years.  Flexible sigmoidoscopy or colonoscopy.** / Every 5 years for a flexible sigmoidoscopy or every 10 years for a colonoscopy beginning at age 55 years and continuing until age 22 years.  Hepatitis C blood test.** / For all people born from [birth year] through 1965 and any  individual with known risks for hepatitis C.  Osteoporosis screening.** / A one-time screening for women ages 32 years and over and women at risk for fractures or osteoporosis.  Skin self-exam. / Monthly.  Influenza vaccine. / Every year.  Tetanus, diphtheria, and acellular pertussis (Tdap/Td) vaccine.** / 1 dose of Td every 10 years.  Varicella vaccine.** / Consult your health care provider.  Zoster vaccine.** / 1 dose for adults aged 60 years or older.  Pneumococcal 13-valent conjugate (PCV13) vaccine.** / Consult your health care provider.  Pneumococcal polysaccharide (PPSV23) vaccine.** / 1 dose for all adults aged 43 years and older.  Meningococcal vaccine.** / Consult your health care provider.  Hepatitis A vaccine.** / Consult your health care provider.  Hepatitis B vaccine.** / Consult your health care provider.  Haemophilus influenzae type b (Hib) vaccine.** / Consult your health care provider. ** Family history and personal history of risk and conditions may change your health care provider's recommendations.   This information is not intended to replace advice given to you by your health care provider. Make sure you discuss  any questions you have with your health care provider.   Document Released: 02/05/2002 Document Revised: 12/31/2014 Document Reviewed: 05/07/2011 Elsevier Interactive Patient Education 2016 Elsevier Inc. Irritable Bowel Syndrome, Adult Irritable bowel syndrome (IBS) is not one specific disease. It is a group of symptoms that affects the organs responsible for digestion (gastrointestinal or GI tract).  To regulate how your GI tract works, your body sends signals back and forth between your intestines and your brain. If you have IBS, there may be a problem with these signals. As a result, your GI tract does not function normally. Your intestines may become more sensitive and overreact to certain things. This is especially true when you eat certain foods  or when you are under stress.  There are four types of IBS. These may be determined based on the consistency of your stool:   IBS with diarrhea.   IBS with constipation.   Mixed IBS.   Unsubtyped IBS.  It is important to know which type of IBS you have. Some treatments are more likely to be helpful for certain types of IBS.  CAUSES  The exact cause of IBS is not known. RISK FACTORS You may have a higher risk of IBS if:  You are a woman.  You are younger than 74 years old.  You have a family history of IBS.  You have mental health problems.  You have had bacterial infection of your GI tract. SIGNS AND SYMPTOMS  Symptoms of IBS vary from person to person. The main symptom is abdominal pain or discomfort. Additional symptoms usually include one or more of the following:   Diarrhea, constipation, or both.   Abdominal swelling or bloating.   Feeling full or sick after eating a small or regular-size meal.   Frequent gas.   Mucus in the stool.   A feeling of having more stool left after a bowel movement.  Symptoms tend to come and go. They may be associated with stress, psychiatric conditions, or nothing at all.  DIAGNOSIS  There is no specific test to diagnose IBS. Your health care provider will make a diagnosis based on a physical exam, medical history, and your symptoms. You may have other tests to rule out other conditions that may be causing your symptoms. These may include:   Blood tests.   X-rays.   CT scan.  Endoscopy and colonoscopy. This is a test in which your GI tract is viewed with a long, thin, flexible tube. TREATMENT There is no cure for IBS, but treatment can help relieve symptoms. IBS treatment often includes:   Changes to your diet, such as:  Eating more fiber.  Avoiding foods that cause symptoms.  Drinking more water.  Eating regular, medium-sized portioned meals.  Medicines. These may include:  Fiber supplements if you have  constipation.  Medicine to control diarrhea (antidiarrheal medicines).  Medicine to help control muscle spasms in your GI tract (antispasmodic medicines).  Medicines to help with any mental health issues, such as antidepressants or tranquilizers.  Therapy.  Talk therapy may help with anxiety, depression, or other mental health issues that can make IBS symptoms worse.  Stress reduction.  Managing your stress can help keep symptoms under control. HOME CARE INSTRUCTIONS   Take medicines only as directed by your health care provider.  Eat a healthy diet.  Avoid foods and drinks with added sugar.  Include more whole grains, fruits, and vegetables gradually into your diet. This may be especially helpful if you have IBS with  constipation.  Avoid any foods and drinks that make your symptoms worse. These may include dairy products and caffeinated or carbonated drinks.  Do not eat large meals.  Drink enough fluid to keep your urine clear or pale yellow.  Exercise regularly. Ask your health care provider for recommendations of good activities for you.  Keep all follow-up visits as directed by your health care provider. This is important. SEEK MEDICAL CARE IF:   You have constant pain.  You have trouble or pain with swallowing.  You have worsening diarrhea. SEEK IMMEDIATE MEDICAL CARE IF:   You have severe and worsening abdominal pain.   You have diarrhea and:   You have a rash, stiff neck, or severe headache.   You are irritable, sleepy, or difficult to awaken.   You are weak, dizzy, or extremely thirsty.   You have bright red blood in your stool or you have black tarry stools.   You have unusual abdominal swelling that is painful.   You vomit continuously.   You vomit blood (hematemesis).   You have both abdominal pain and a fever.    This information is not intended to replace advice given to you by your health care provider. Make sure you discuss any  questions you have with your health care provider.   Document Released: 12/10/2005 Document Revised: 12/31/2014 Document Reviewed: 08/27/2014 Elsevier Interactive Patient Education [employer].

## 2015-10-31 ENCOUNTER — Telehealth: Payer: Self-pay | Admitting: *Deleted

## 2015-10-31 NOTE — Telephone Encounter (Signed)
QL PA submitted. Awaiting determination. JG//CMA

## 2015-11-01 NOTE — Telephone Encounter (Signed)
Quantity limit request denied for Metformin 500mg . Insurance suggests for pt to be prescribed Metformin 1000 mg if same results can be achieved. Please advise. JG//CMA

## 2015-11-01 NOTE — Telephone Encounter (Signed)
So the problem is she has Metformin XR and I cannot find a prescription for a 1000 mg tab in the XR form, she does not tolerate the short acting. If there is a 1000 mg tab of the XR I am not aware of I am willing to switch

## 2015-11-03 ENCOUNTER — Other Ambulatory Visit (INDEPENDENT_AMBULATORY_CARE_PROVIDER_SITE_OTHER): Payer: Medicare Other

## 2015-11-03 ENCOUNTER — Telehealth: Payer: Self-pay | Admitting: Family Medicine

## 2015-11-03 DIAGNOSIS — Z780 Asymptomatic menopausal state: Secondary | ICD-10-CM | POA: Diagnosis not present

## 2015-11-03 DIAGNOSIS — R197 Diarrhea, unspecified: Secondary | ICD-10-CM | POA: Diagnosis not present

## 2015-11-03 DIAGNOSIS — M8580 Other specified disorders of bone density and structure, unspecified site: Secondary | ICD-10-CM | POA: Diagnosis not present

## 2015-11-03 DIAGNOSIS — E119 Type 2 diabetes mellitus without complications: Secondary | ICD-10-CM | POA: Diagnosis not present

## 2015-11-03 DIAGNOSIS — E782 Mixed hyperlipidemia: Secondary | ICD-10-CM | POA: Diagnosis not present

## 2015-11-03 DIAGNOSIS — E669 Obesity, unspecified: Secondary | ICD-10-CM

## 2015-11-03 DIAGNOSIS — E1169 Type 2 diabetes mellitus with other specified complication: Secondary | ICD-10-CM

## 2015-11-03 LAB — LIPID PANEL
CHOL/HDL RATIO: 3
Cholesterol: 126 mg/dL (ref 0–200)
HDL: 37.5 mg/dL — AB (ref >39.00)
LDL Cholesterol: 60 mg/dL (ref 0–99)
NONHDL: 88.83
Triglycerides: 145 mg/dL (ref 0.0–149.0)
VLDL: 29 mg/dL (ref 0.0–40.0)

## 2015-11-03 LAB — COMPREHENSIVE METABOLIC PANEL
ALK PHOS: 50 U/L (ref 39–117)
ALT: 14 U/L (ref 0–35)
AST: 16 U/L (ref 0–37)
Albumin: 4 g/dL (ref 3.5–5.2)
BUN: 10 mg/dL (ref 6–23)
CO2: 28 mEq/L (ref 19–32)
Calcium: 9.4 mg/dL (ref 8.4–10.5)
Chloride: 105 mEq/L (ref 96–112)
Creatinine, Ser: 0.59 mg/dL (ref 0.40–1.20)
GFR: 105.8 mL/min (ref >60.00)
GLUCOSE: 70 mg/dL (ref 70–99)
POTASSIUM: 4.1 meq/L (ref 3.5–5.1)
Sodium: 142 mEq/L (ref 135–145)
TOTAL PROTEIN: 6.8 g/dL (ref 6.0–8.3)
Total Bilirubin: 0.4 mg/dL (ref 0.2–1.2)

## 2015-11-03 LAB — CBC WITH DIFFERENTIAL/PLATELET
BASOS ABS: 0 10*3/uL (ref 0.0–0.1)
Basophils Relative: 0.5 % (ref 0.0–3.0)
Eosinophils Absolute: 0.4 10*3/uL (ref 0.0–0.7)
Eosinophils Relative: 5.1 % — ABNORMAL HIGH (ref 0.0–5.0)
HEMATOCRIT: 39.3 % (ref 36.0–46.0)
HEMOGLOBIN: 12.8 g/dL (ref 12.0–15.0)
LYMPHS PCT: 31.4 % (ref 12.0–46.0)
Lymphs Abs: 2.3 10*3/uL (ref 0.7–4.0)
MCHC: 32.6 g/dL (ref 30.0–36.0)
MCV: 89.3 fl (ref 78.0–100.0)
MONOS PCT: 7 % (ref 3.0–12.0)
Monocytes Absolute: 0.5 10*3/uL (ref 0.1–1.0)
NEUTROS PCT: 56 % (ref 43.0–77.0)
Neutro Abs: 4.1 10*3/uL (ref 1.4–7.7)
Platelets: 309 10*3/uL (ref 150.0–400.0)
RBC: 4.4 Mil/uL (ref 3.87–5.11)
RDW: 13.7 % (ref 11.5–15.5)
WBC: 7.4 10*3/uL (ref 4.0–10.5)

## 2015-11-03 LAB — MICROALBUMIN / CREATININE URINE RATIO
CREATININE, U: 127.5 mg/dL
MICROALB/CREAT RATIO: 1.3 mg/g (ref 0.0–30.0)
Microalb, Ur: 1.6 mg/dL (ref 0.0–1.9)

## 2015-11-03 LAB — HEMOGLOBIN A1C: Hgb A1c MFr Bld: 7.4 % — ABNORMAL HIGH (ref 4.6–6.5)

## 2015-11-03 LAB — VITAMIN D 25 HYDROXY (VIT D DEFICIENCY, FRACTURES): VITD: 58.47 ng/mL (ref 30.00–100.00)

## 2015-11-03 LAB — TSH: TSH: 3.04 u[IU]/mL (ref 0.35–4.50)

## 2015-11-03 MED ORDER — METFORMIN HCL ER (OSM) 1000 MG PO TB24: ORAL_TABLET | ORAL | Status: DC

## 2015-11-03 NOTE — Telephone Encounter (Signed)
Called and informed pt and she verbalized understanding. JG//CMA

## 2015-11-03 NOTE — Telephone Encounter (Signed)
E-scribed to pharmacy. JG//CMA  

## 2015-11-03 NOTE — Addendum Note (Signed)
Addended by: Murtis Sink A on: 11/03/2015 11:37 AM   Modules accepted: Orders, Medications

## 2015-11-03 NOTE — Telephone Encounter (Signed)
Regarding PA. See previous note.

## 2015-11-03 NOTE — Telephone Encounter (Signed)
Pt drop off document for additional inf. to give. Papers have instruction written from pt.

## 2015-11-03 NOTE — Telephone Encounter (Signed)
I am OK with that please rx the Metformin ER 1000 mg tabs 1 tab po bid and 1/2 tab at noon. Disp #[id number] with 3 rf

## 2015-11-03 NOTE — Telephone Encounter (Signed)
Insurance allows only 2.5 tabs per day of Metformin Er 1000mg .

## 2015-11-06 ENCOUNTER — Encounter: Payer: Self-pay | Admitting: Family Medicine

## 2015-11-06 DIAGNOSIS — Z0000 Encounter for general adult medical examination without abnormal findings: Secondary | ICD-10-CM | POA: Insufficient documentation

## 2015-11-06 NOTE — Assessment & Plan Note (Signed)
Multifactorial, encouraged increased exercise, 8 hours of sleep, heart healthy diet.

## 2015-11-06 NOTE — Assessment & Plan Note (Signed)
Encouraged heart healthy diet, increase exercise, avoid trans fats, consider a krill oil cap daily 

## 2015-11-06 NOTE — Assessment & Plan Note (Signed)
Patient denies any difficulties at home. No trouble with ADLs, depression or falls. See EMR for functional status screen and depression screen. No recent changes to vision or hearing. Is UTD with immunizations. Is UTD with screening. Discussed Advanced Directives. Encouraged heart healthy diet, exercise as tolerated and adequate sleep. See patient's problem list for health risk factors to monitor. See AVS for preventative healthcare recommendation schedule. Labs reviewed Sees  Dr Delman Cheadle opthamology Dr Deatra Ina gastroenterology, colonoscopy 2011 Dr [place] derm Pap June 2013 [facility] 8/16

## 2015-11-06 NOTE — Assessment & Plan Note (Signed)
Encouraged DASH diet, decrease po intake and increase exercise as tolerated. Needs 7-8 hours of sleep nightly. Avoid trans fats, eat small, frequent meals every 4-5 hours with lean proteins, complex carbs and healthy fats. Minimize simple carbs, GMO foods. 

## 2015-11-06 NOTE — Assessment & Plan Note (Signed)
hgba1c still elevated but improving, minimize simple carbs. Increase exercise as tolerated. Continue current meds for now and patient is increasing her activity

## 2015-11-06 NOTE — Progress Notes (Signed)
Subjective:    Patient ID: Gail Lester, female    DOB: Mar 03, 1941, 74 y.o.   MRN: [id number]  Chief Complaint  Patient presents with  . Annual Exam    HPI Patient is in today for annual exam and follow up. Is still struggling with fatigue and malaise but does report on mass feeling better. Continues to struggle with back pain and joint pain. Had very bad run of watery diarrhea several months back but that is all resolved. Blood sugars are generally below 100 and the mornings but can go as high as 200 and the evenings. No polyuria or polydipsia. Denies CP/palp/SOB/HA/congestion/fevers/GI or GU c/o. Taking meds as prescribed  Past Medical History  Diagnosis Date  . Diabetes mellitus   . Hypercholesteremia   . Arthritis   . Diabetes ([place])   . Chicken pox as a child  . Measles as a child  . Mumps as a child  . Diarrhea 02/10/2013  . Other and unspecified hyperlipidemia 02/10/2013  . Fibrocystic breast 02/10/2013    Left h/o FN biopsy   . Dupuytren's contracture of both hands 02/10/2013  . Other malaise and fatigue 08/04/2013  . Encounter for Medicare annual wellness exam 08/04/2013  . Overweight 06/14/2014  . Ganglion cyst 11/14/2014    Right 2nd finger  . Left arm pain 11/14/2014  . Diabetes mellitus type 2 in obese [facility]) 02/10/2013    Saw Dr Delman Cheadle, eye doctor in 01/01/13, exam in chart   . Cough 03/24/2015  . Keratosis, seborrheic 08/01/2015  . 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 Medicare annual wellness visit, subsequent 11/06/2015    Past Surgical History  Procedure Laterality Date  . Appendectomy  1955  . Orif wrist fracture  12/09/2011    Procedure: OPEN REDUCTION INTERNAL FIXATION (ORIF) WRIST FRACTURE;  Surgeon: Linna Hoff;  Location: [facility];  Service: Orthopedics;  Laterality: Left;  . Breast biopsy  1985-1990    benign    Family History  Problem Relation Age of Onset  . Colon cancer      grandparent  . Kidney disease Mother   . Diabetes Father   . Kidney  disease Maternal Grandmother   . Cancer Maternal Grandmother     bladder  . Cancer Maternal Aunt   . Alzheimer's disease Maternal Aunt   . Scoliosis Son     Social History   Social History  . Marital Status: Divorced    Spouse Name: N/A  . Number of Children: N/A  . Years of Education: N/A   Occupational History  . Not on file.   Social History Main Topics  . Smoking status: Former Smoker    Quit date: 11/17/1989  . Smokeless tobacco: Not on file  . Alcohol Use: No  . Drug Use: No  . Sexual Activity: No   Other Topics Concern  . Not on file   Social History Narrative    Outpatient Prescriptions Prior to Visit  Medication Sig Dispense Refill  . aspirin 81 MG tablet Take 81 mg by mouth daily.    Marland Kitchen b complex vitamins tablet Take 1 tablet by mouth as directed. Two times a week    . Blood Glucose Monitoring Suppl (ACCU-CHEK AVIVA PLUS) W/DEVICE KIT Use as directed 1 kit 0  . CALCIUM PO Take 400 mg by mouth daily.    . Cholecalciferol (D-3-5) 5000 UNITS capsule Take 4,000 Units by mouth daily.     . Coenzyme Q10 (COQ10) 100 MG CAPS  Take 1 capsule by mouth daily.    Marland Kitchen glimepiride (AMARYL) 4 MG tablet Take 1 tablet (4 mg total) by mouth 2 (two) times daily. Dx Code: 250.00 180 tablet 2  . glucose blood (ACCU-CHEK AVIVA PLUS) test strip Check Blood Sugar bid 300 each 12  . Lutein 20 MG CAPS Take by mouth.    Marland Kitchen MAGNESIUM PO Take 250 mg by mouth daily.     . Multiple Vitamin (MULTIVITAMIN) tablet Take 1 tablet by mouth as directed. Two times a week    . Probiotic Product (PROBIOTIC DAILY PO) Take 1 capsule by mouth daily.    . ramipril (ALTACE) 2.5 MG capsule Take 1 capsule (2.5 mg total) by mouth daily. 90 capsule 1  . rosuvastatin (CRESTOR) 20 MG tablet Take one-half tablet by  mouth daily 45 tablet 2  . Turmeric 500 MG CAPS Take by mouth daily.    . vitamin C (ASCORBIC ACID) 500 MG tablet Take 500 mg by mouth 2 (two) times daily.    Marland Kitchen ACCU-CHEK SOFTCLIX LANCETS lancets Use  as instructed 100 each 6  . metFORMIN (GLUCOPHAGE) 1000 MG tablet Take 1 tablet by mouth  twice a day with meals 180 tablet 2   No facility-administered medications prior to visit.    Allergies  Allergen Reactions  . Lipitor [Atorvastatin]     Diarrhea, muscle cramps  . Lisinopril     myalgias    Review of Systems  Constitutional: Positive for malaise/fatigue. Negative for fever.  HENT: Negative for congestion.   Eyes: Negative for discharge.  Respiratory: Negative for shortness of breath.   Cardiovascular: Negative for chest pain, palpitations and leg swelling.  Gastrointestinal: Positive for diarrhea. Negative for nausea and abdominal pain.  Genitourinary: Negative for dysuria.  Musculoskeletal: Positive for joint pain. Negative for falls.  Skin: Negative for rash.  Neurological: Negative for loss of consciousness and headaches.  Endo/Heme/Allergies: Negative for environmental allergies.  Psychiatric/Behavioral: Negative for depression. The patient is not nervous/anxious.        Objective:    Physical Exam  Constitutional: She is oriented to person, place, and time. She appears well-developed and well-nourished. No distress.  HENT:  Head: Normocephalic and atraumatic.  Nose: Nose normal.  Eyes: Right eye exhibits no discharge. Left eye exhibits no discharge.  Neck: Normal range of motion. Neck supple.  Cardiovascular: Normal rate and regular rhythm.   No murmur heard. Pulmonary/Chest: Effort normal and breath sounds normal.  Abdominal: Soft. Bowel sounds are normal. There is no tenderness.  Musculoskeletal: She exhibits no edema.  Neurological: She is alert and oriented to person, place, and time.  Skin: Skin is warm and dry.  Psychiatric: She has a normal mood and affect.  Nursing note and vitals reviewed.   BP 108/70 mmHg  Pulse 75  Temp(Src) 98.4 F (36.9 C) (Oral)  Ht 5' 4" (1.626 m)  Wt 162 lb (73.483 kg)  BMI 27.79 kg/m2  SpO2 96% Wt Readings from  Last 3 Encounters:  10/24/15 162 lb (73.483 kg)  08/01/15 167 lb (75.751 kg)  03/11/15 164 lb (74.39 kg)     Lab Results  Component Value Date   WBC 7.4 11/03/2015   HGB 12.8 11/03/2015   HCT 39.3 11/03/2015   PLT 309.0 11/03/2015   GLUCOSE 70 11/03/2015   CHOL 126 11/03/2015   TRIG 145.0 11/03/2015   HDL 37.50* 11/03/2015   LDLDIRECT 109.7 11/24/2012   LDLCALC 60 11/03/2015   ALT 14 11/03/2015   AST 16  11/03/2015   NA 142 11/03/2015   K 4.1 11/03/2015   CL 105 11/03/2015   CREATININE 0.59 11/03/2015   BUN 10 11/03/2015   CO2 28 11/03/2015   TSH 3.04 11/03/2015   HGBA1C 7.4* 11/03/2015   MICROALBUR 1.6 11/03/2015    Lab Results  Component Value Date   TSH 3.04 11/03/2015   Lab Results  Component Value Date   WBC 7.4 11/03/2015   HGB 12.8 11/03/2015   HCT 39.3 11/03/2015   MCV 89.3 11/03/2015   PLT 309.0 11/03/2015   Lab Results  Component Value Date   NA 142 11/03/2015   K 4.1 11/03/2015   CO2 28 11/03/2015   GLUCOSE 70 11/03/2015   BUN 10 11/03/2015   CREATININE 0.59 11/03/2015   BILITOT 0.4 11/03/2015   ALKPHOS 50 11/03/2015   AST 16 11/03/2015   ALT 14 11/03/2015   PROT 6.8 11/03/2015   ALBUMIN 4.0 11/03/2015   CALCIUM 9.4 11/03/2015   GFR 105.80 11/03/2015   Lab Results  Component Value Date   CHOL 126 11/03/2015   Lab Results  Component Value Date   HDL 37.50* 11/03/2015   Lab Results  Component Value Date   LDLCALC 60 11/03/2015   Lab Results  Component Value Date   TRIG 145.0 11/03/2015   Lab Results  Component Value Date   CHOLHDL 3 11/03/2015   Lab Results  Component Value Date   HGBA1C 7.4* 11/03/2015       Assessment & Plan:   Problem List Items Addressed This Visit    Overweight - Primary    Encouraged DASH diet, decrease po intake and increase exercise as tolerated. Needs 7-8 hours of sleep nightly. Avoid trans fats, eat small, frequent meals every 4-5 hours with lean proteins, complex carbs and healthy fats.  Minimize simple carbs, GMO foods.      Relevant Orders   TSH   CBC   Hemoglobin A1c   Lipid panel   Comprehensive metabolic panel   Microalbumin / creatinine urine ratio   DG Bone Density   Vitamin D (25 hydroxy)   Medicare annual wellness visit, subsequent   Hyperlipidemia, mixed    Encouraged heart healthy diet, increase exercise, avoid trans fats, consider a krill oil cap daily      Relevant Orders   TSH   CBC   Hemoglobin A1c   Lipid panel   Comprehensive metabolic panel   Microalbumin / creatinine urine ratio   DG Bone Density   Vitamin D (25 hydroxy)   CBC with Differential/Platelet (Completed)   Lipid panel (Completed)   Comp Met (CMET) (Completed)   Hemoglobin A1c (Completed)   Microalbumin / creatinine urine ratio (Completed)   Encounter for Medicare annual wellness exam    Patient denies any difficulties at home. No trouble with ADLs, depression or falls. See EMR for functional status screen and depression screen. No recent changes to vision or hearing. Is UTD with immunizations. Is UTD with screening. Discussed Advanced Directives. Encouraged heart healthy diet, exercise as tolerated and adequate sleep. See patient's problem list for health risk factors to monitor. See AVS for preventative healthcare recommendation schedule. Labs reviewed Sees  Dr Delman Cheadle opthamology Dr Deatra Ina gastroenterology, colonoscopy 2011 Dr [place] derm Pap June 2013 [facility] 8/16       Diarrhea   Relevant Orders   TSH   CBC   Hemoglobin A1c   Lipid panel   Comprehensive metabolic panel   Microalbumin / creatinine urine ratio  DG Bone Density   Vitamin D (25 hydroxy)   TSH (Completed)   CBC with Differential/Platelet (Completed)   Diabetes mellitus type 2 in obese ([place])    hgba1c still elevated but improving, minimize simple carbs. Increase exercise as tolerated. Continue current meds for now and patient is increasing her activity      Relevant Orders   TSH   CBC   Hemoglobin  A1c   Lipid panel   Comprehensive metabolic panel   Microalbumin / creatinine urine ratio   DG Bone Density   Vitamin D (25 hydroxy)   Lipid panel (Completed)   Comp Met (CMET) (Completed)   Hemoglobin A1c (Completed)   Microalbumin / creatinine urine ratio (Completed)    Other Visit Diagnoses    Osteopenia        Relevant Orders    DG Bone Density    Vitamin D (25 hydroxy)    Vitamin D (25 hydroxy) (Completed)    TSH (Completed)    Postmenopausal estrogen deficiency        Relevant Orders    DG Bone Density    Vitamin D (25 hydroxy)    Vitamin D (25 hydroxy) (Completed)    TSH (Completed)       I have discontinued Ms. Colwell's metFORMIN. I have also changed her [place]. Additionally, I am having her maintain her aspirin, Cholecalciferol, Lutein, b complex vitamins, multivitamin, MAGNESIUM PO, CALCIUM PO, vitamin C, Probiotic Product (PROBIOTIC DAILY PO), CoQ10, Turmeric, rosuvastatin, glimepiride, ramipril, ACCU-CHEK AVIVA PLUS, and glucose blood.  Meds ordered this encounter  Medications  . ACCU-CHEK SOFTCLIX LANCETS lancets    Sig: Check blood sugar twice daily.  DX E11.9    Dispense:  200 each    Refill:  6  . DISCONTD: metFORMIN (GLUCOPHAGE-XR) 500 MG 24 hr tablet    Sig: 2 tabs po bid and 1 tab po q noon    Dispense:  450 tablet    Refill:  1     Jettson Crable, MD

## 2015-11-08 DIAGNOSIS — L718 Other rosacea: Secondary | ICD-10-CM | POA: Diagnosis not present

## 2015-11-08 DIAGNOSIS — D225 Melanocytic nevi of trunk: Secondary | ICD-10-CM | POA: Diagnosis not present

## 2015-11-08 DIAGNOSIS — L570 Actinic keratosis: Secondary | ICD-10-CM | POA: Diagnosis not present

## 2015-11-08 DIAGNOSIS — L719 Rosacea, unspecified: Secondary | ICD-10-CM | POA: Insufficient documentation

## 2015-11-08 DIAGNOSIS — L821 Other seborrheic keratosis: Secondary | ICD-10-CM | POA: Diagnosis not present

## 2015-11-09 ENCOUNTER — Telehealth: Payer: Self-pay | Admitting: Family Medicine

## 2015-11-09 ENCOUNTER — Telehealth: Payer: Self-pay

## 2015-11-09 MED ORDER — METFORMIN HCL ER (OSM) 1000 MG PO TB24: 1000.0000 mg | ORAL_TABLET | Freq: Every day | ORAL | Status: DC

## 2015-11-09 NOTE — Telephone Encounter (Signed)
Spoke with pharmacist Almyra Free and I will have to verify with Dr. Charlett Blake the Rx and how its written.

## 2015-11-09 NOTE — Telephone Encounter (Signed)
Is there a 500 mg ER tab they will pay for either 5 of those daily and get rid of the 1000 mg tabs or keep the 1000 mg bid and add a 500 mg at noon?

## 2015-11-09 NOTE — Telephone Encounter (Signed)
Caller name: Verdis Frederickson  Relation to pt: [facility] Call back number: [phone] reference # [id number]   Reason for call:  In need of clarification regarding direction for metformin (FORTAMET) 1000 MG (OSM) 24 hr tablet

## 2015-11-09 NOTE — Telephone Encounter (Signed)
Spoke with Georgina Snell at [facility] and I advised him of the change per VO from Dr. Charlett Blake it was changed in our system. Georgina Snell states that the medication is not able to be cut in half due to it being extended release. Please advise.

## 2015-11-09 NOTE — Addendum Note (Signed)
Addended by: Tasia Catchings on: 11/09/2015 11:55 AM   Modules accepted: Orders, Medications

## 2015-11-09 NOTE — Addendum Note (Signed)
Addended by: Tasia Catchings on: 11/09/2015 12:11 PM   Modules accepted: Orders

## 2015-11-10 NOTE — Telephone Encounter (Signed)
Spoke with Ronalee Belts at [facility] and he stated that he would have to do two prescriptions so that the insurance would cover the medication. It will be the 1000 mg ER in the morning and 1000 mg ER at night, and 500 mg ER at lunch.  So the pt will take one tablet in the morning, one tablet at lunch, and one tablet at night. I called pt and explained how the medication would be sent and she voiced understanding. Also notified pt that if she has any other questions please call the office back.

## 2015-11-10 NOTE — Telephone Encounter (Signed)
perfect

## 2015-12-17 ENCOUNTER — Other Ambulatory Visit: Payer: Self-pay | Admitting: Family Medicine

## 2015-12-28 ENCOUNTER — Telehealth: Payer: Self-pay | Admitting: Family Medicine

## 2015-12-28 ENCOUNTER — Other Ambulatory Visit: Payer: Medicare Other

## 2015-12-28 ENCOUNTER — Other Ambulatory Visit (INDEPENDENT_AMBULATORY_CARE_PROVIDER_SITE_OTHER): Payer: Medicare Other

## 2015-12-28 DIAGNOSIS — K625 Hemorrhage of anus and rectum: Secondary | ICD-10-CM | POA: Diagnosis not present

## 2015-12-28 LAB — FECAL OCCULT BLOOD, IMMUNOCHEMICAL: FECAL OCCULT BLD: NEGATIVE

## 2015-12-28 NOTE — Telephone Encounter (Signed)
IFOB entered. 

## 2016-01-16 DIAGNOSIS — I789 Disease of capillaries, unspecified: Secondary | ICD-10-CM

## 2016-01-16 HISTORY — DX: Disease of capillaries, unspecified: I78.9

## 2016-01-30 ENCOUNTER — Ambulatory Visit (INDEPENDENT_AMBULATORY_CARE_PROVIDER_SITE_OTHER): Payer: Medicare Other | Admitting: Family Medicine

## 2016-01-30 ENCOUNTER — Encounter: Payer: Self-pay | Admitting: Family Medicine

## 2016-01-30 VITALS — BP 112/72 | HR 75 | Temp 97.8°F | Ht 64.0 in | Wt 158.5 lb

## 2016-01-30 DIAGNOSIS — R194 Change in bowel habit: Secondary | ICD-10-CM

## 2016-01-30 DIAGNOSIS — R197 Diarrhea, unspecified: Secondary | ICD-10-CM | POA: Diagnosis not present

## 2016-01-30 DIAGNOSIS — R198 Other specified symptoms and signs involving the digestive system and abdomen: Secondary | ICD-10-CM

## 2016-01-30 DIAGNOSIS — E1169 Type 2 diabetes mellitus with other specified complication: Secondary | ICD-10-CM

## 2016-01-30 DIAGNOSIS — E782 Mixed hyperlipidemia: Secondary | ICD-10-CM | POA: Diagnosis not present

## 2016-01-30 DIAGNOSIS — E669 Obesity, unspecified: Secondary | ICD-10-CM

## 2016-01-30 DIAGNOSIS — R195 Other fecal abnormalities: Secondary | ICD-10-CM

## 2016-01-30 DIAGNOSIS — E119 Type 2 diabetes mellitus without complications: Secondary | ICD-10-CM

## 2016-01-30 MED ORDER — METFORMIN HCL ER 500 MG PO TB24
500.0000 mg | ORAL_TABLET | Freq: Every day | ORAL | Status: DC
Start: 2016-01-30 — End: 2016-07-02

## 2016-01-30 MED ORDER — RAMIPRIL 2.5 MG PO CAPS: ORAL_CAPSULE | ORAL | Status: DC

## 2016-01-30 MED ORDER — METFORMIN HCL ER (OSM) 1000 MG PO TB24: 1000.0000 mg | ORAL_TABLET | Freq: Two times a day (BID) | ORAL | Status: DC

## 2016-01-30 NOTE — Progress Notes (Signed)
Pre visit review using our clinic review tool, if applicable. No additional management support is needed unless otherwise documented below in the visit note. 

## 2016-01-30 NOTE — Progress Notes (Signed)
Subjective:    Patient ID: Gail Lester, female    DOB: [birth year], 75 y.o.   MRN: [id number]  Chief Complaint  Patient presents with  . Follow-up    HPI Patient is in today for follow up with numerous concerns fell x 3 weeks ago playing with dog sore spot on left knee, bruise on right hip, and hit head on furniture but no evidence of knot or c/o of head pain or headaches. Pt also having some digestive and bowel problems, bowel frequency with watery stools. Pt stopped otc 09/19/15 medicines and coffee for a month to see how bowels would do, still same symptoms as previously discussed.  Pt having 1-4 loose stools a day no blood in stools. Denies CP/palp/SOB/HA/congestion/fevers.     Past Medical History  Diagnosis Date  . Diabetes mellitus   . Hypercholesteremia   . Arthritis   . Diabetes ([place])   . Chicken pox as a child  . Measles as a child  . Mumps as a child  . Diarrhea 02/10/2013  . Other and unspecified hyperlipidemia 02/10/2013  . Fibrocystic breast 02/10/2013    Left h/o FN biopsy   . Dupuytren's contracture of both hands 02/10/2013  . Other malaise and fatigue 08/04/2013  . Encounter for Medicare annual wellness exam 08/04/2013  . Overweight 06/14/2014  . Ganglion cyst 11/14/2014    Right 2nd finger  . Left arm pain 11/14/2014  . Diabetes mellitus type 2 in obese [facility]) 02/10/2013    Saw Dr Delman Cheadle, eye doctor in 01/01/13, exam in chart   . Cough 03/24/2015  . Keratosis, seborrheic 08/01/2015  . 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 Medicare annual wellness visit, subsequent 11/06/2015    Past Surgical History  Procedure Laterality Date  . Appendectomy  1955  . Orif wrist fracture  12/09/2011    Procedure: OPEN REDUCTION INTERNAL FIXATION (ORIF) WRIST FRACTURE;  Surgeon: Linna Hoff;  Location: [place];  Service: Orthopedics;  Laterality: Left;  . Breast biopsy  1985-1990    benign    Family History  Problem Relation Age of Onset  . Colon cancer      grandparent    . Kidney disease Mother   . Diabetes Father   . Kidney disease Maternal Grandmother   . Cancer Maternal Grandmother     bladder  . Cancer Maternal Aunt   . Alzheimer's disease Maternal Aunt   . Scoliosis Son     Social History   Social History  . Marital Status: Divorced    Spouse Name: N/A  . Number of Children: N/A  . Years of Education: N/A   Occupational History  . Not on file.   Social History Main Topics  . Smoking status: Former Smoker    Quit date: 11/17/1989  . Smokeless tobacco: Not on file  . Alcohol Use: No  . Drug Use: No  . Sexual Activity: No   Other Topics Concern  . Not on file   Social History Narrative    Outpatient Prescriptions Prior to Visit  Medication Sig Dispense Refill  . ACCU-CHEK SOFTCLIX LANCETS lancets Check blood sugar twice daily.  DX E11.9 200 each 6  . aspirin 81 MG tablet Take 81 mg by mouth daily.    Marland Kitchen b complex vitamins tablet Take 1 tablet by mouth as directed. Two times a week    . Blood Glucose Monitoring Suppl (ACCU-CHEK AVIVA PLUS) W/DEVICE KIT Use as directed 1 kit 0  . CALCIUM  PO Take 400 mg by mouth daily.    . Cholecalciferol (D-3-5) 5000 UNITS capsule Take 4,000 Units by mouth daily.     . Coenzyme Q10 (COQ10) 100 MG CAPS Take 1 capsule by mouth daily.    Marland Kitchen glimepiride (AMARYL) 4 MG tablet Take 1 tablet (4 mg total) by mouth 2 (two) times daily. Dx Code: 250.00 180 tablet 2  . glucose blood (ACCU-CHEK AVIVA PLUS) test strip Check Blood Sugar bid 300 each 12  . Lutein 20 MG CAPS Take by mouth.    Marland Kitchen MAGNESIUM PO Take 250 mg by mouth daily.     . Multiple Vitamin (MULTIVITAMIN) tablet Take 1 tablet by mouth as directed. Two times a week    . Probiotic Product (PROBIOTIC DAILY PO) Take 1 capsule by mouth daily.    . rosuvastatin (CRESTOR) 20 MG tablet Take one-half tablet by  mouth daily 45 tablet 2  . Turmeric 500 MG CAPS Take by mouth daily.    . vitamin C (ASCORBIC ACID) 500 MG tablet Take 500 mg by mouth 2 (two)  times daily.    . ramipril (ALTACE) 2.5 MG capsule Take 1 capsule by mouth  daily 90 capsule 1   No facility-administered medications prior to visit.    Allergies  Allergen Reactions  . Lipitor [Atorvastatin]     Diarrhea, muscle cramps  . Lisinopril     myalgias    Review of Systems  Constitutional: Negative for fever and malaise/fatigue.  HENT: Negative for congestion.   Eyes: Negative for discharge.  Respiratory: Negative for shortness of breath.   Cardiovascular: Negative for chest pain, palpitations and leg swelling.  Gastrointestinal: Positive for abdominal pain and diarrhea. Negative for nausea, constipation, blood in stool and melena.  Genitourinary: Negative for dysuria.  Musculoskeletal: Negative for falls.  Skin: Negative for rash.  Neurological: Negative for loss of consciousness and headaches.  Endo/Heme/Allergies: Negative for environmental allergies.  Psychiatric/Behavioral: Negative for depression. The patient is not nervous/anxious.        Objective:    Physical Exam  Constitutional: She is oriented to person, place, and time. She appears well-developed and well-nourished. No distress.  HENT:  Head: Normocephalic and atraumatic.  Nose: Nose normal.  Eyes: Right eye exhibits no discharge. Left eye exhibits no discharge.  Neck: Normal range of motion. Neck supple.  Cardiovascular: Normal rate and regular rhythm.   No murmur heard. Pulmonary/Chest: Effort normal and breath sounds normal.  Abdominal: Soft. Bowel sounds are normal. There is no tenderness.  Musculoskeletal: She exhibits no edema.  Neurological: She is alert and oriented to person, place, and time.  Skin: Skin is warm and dry.  Psychiatric: She has a normal mood and affect.  Nursing note and vitals reviewed.   BP 112/72 mmHg  Pulse 75  Temp(Src) 97.8 F (36.6 C) (Oral)  Ht '5\' 4"'  (1.626 m)  Wt 158 lb 8 oz (71.895 kg)  BMI 27.19 kg/m2  SpO2 97% Wt Readings from Last 3 Encounters:   01/30/16 158 lb 8 oz (71.895 kg)  10/24/15 162 lb (73.483 kg)  08/01/15 167 lb (75.751 kg)     Lab Results  Component Value Date   WBC 7.4 11/03/2015   HGB 12.8 11/03/2015   HCT 39.3 11/03/2015   PLT 309.0 11/03/2015   GLUCOSE 70 11/03/2015   CHOL 126 11/03/2015   TRIG 145.0 11/03/2015   HDL 37.50* 11/03/2015   LDLDIRECT 109.7 11/24/2012   LDLCALC 60 11/03/2015   ALT 14 11/03/2015  AST 16 11/03/2015   NA 142 11/03/2015   K 4.1 11/03/2015   CL 105 11/03/2015   CREATININE 0.59 11/03/2015   BUN 10 11/03/2015   CO2 28 11/03/2015   TSH 3.04 11/03/2015   HGBA1C 7.4* 11/03/2015   MICROALBUR 1.6 11/03/2015    Lab Results  Component Value Date   TSH 3.04 11/03/2015   Lab Results  Component Value Date   WBC 7.4 11/03/2015   HGB 12.8 11/03/2015   HCT 39.3 11/03/2015   MCV 89.3 11/03/2015   PLT 309.0 11/03/2015   Lab Results  Component Value Date   NA 142 11/03/2015   K 4.1 11/03/2015   CO2 28 11/03/2015   GLUCOSE 70 11/03/2015   BUN 10 11/03/2015   CREATININE 0.59 11/03/2015   BILITOT 0.4 11/03/2015   ALKPHOS 50 11/03/2015   AST 16 11/03/2015   ALT 14 11/03/2015   PROT 6.8 11/03/2015   ALBUMIN 4.0 11/03/2015   CALCIUM 9.4 11/03/2015   GFR 105.80 11/03/2015   Lab Results  Component Value Date   CHOL 126 11/03/2015   Lab Results  Component Value Date   HDL 37.50* 11/03/2015   Lab Results  Component Value Date   LDLCALC 60 11/03/2015   Lab Results  Component Value Date   TRIG 145.0 11/03/2015   Lab Results  Component Value Date   CHOLHDL 3 11/03/2015   Lab Results  Component Value Date   HGBA1C 7.4* 11/03/2015       Assessment & Plan:   Problem List Items Addressed This Visit    Diabetes mellitus type 2 in obese ([place]) - Primary    hgba1c acceptable, minimize simple carbs. Increase exercise as tolerated. Continue current meds.       Relevant Medications   metFORMIN (GLUCOPHAGE-XR) 500 MG 24 hr tablet   metformin (FORTAMET) 1000 MG  (OSM) 24 hr tablet   ramipril (ALTACE) 2.5 MG capsule   Other Relevant Orders   Hemoglobin A1c   Diarrhea    Check stool cultures, o and p etc, encouraged to add Benefiber bid and use probiotics, seek care if worsens.      Relevant Orders   Comprehensive metabolic panel   Clostridium Difficile by PCR   Ova and parasite examination   Stool culture   Stool, WBC/Lactoferrin   Hyperlipidemia, mixed    Encouraged heart healthy diet, increase exercise, avoid trans fats, consider a krill oil cap daily. Continue Crestor      Relevant Medications   ramipril (ALTACE) 2.5 MG capsule   Other Relevant Orders   Lipid panel    Other Visit Diagnoses    Watery stools        Relevant Orders    Clostridium Difficile by PCR    Ova and parasite examination    Stool culture    Stool, WBC/Lactoferrin    Increased bowel frequency        Relevant Orders    Clostridium Difficile by PCR    Ova and parasite examination    Stool culture    Stool, WBC/Lactoferrin       I have changed Ms. Lapoint's metFORMIN and metformin. I am also having her maintain her aspirin, Cholecalciferol, Lutein, b complex vitamins, multivitamin, MAGNESIUM PO, CALCIUM PO, vitamin C, Probiotic Product (PROBIOTIC DAILY PO), CoQ10, Turmeric, rosuvastatin, glimepiride, ACCU-CHEK AVIVA PLUS, glucose blood, ACCU-CHEK SOFTCLIX LANCETS, and ramipril.  Meds ordered this encounter  Medications  . DISCONTD: metFORMIN (GLUCOPHAGE-XR) 500 MG 24 hr tablet    Sig:  Take 500 mg by mouth daily.  Marland Kitchen DISCONTD: metformin (FORTAMET) 1000 MG (OSM) 24 hr tablet    Sig: Take 1,000 mg by mouth 2 (two) times daily.  . metFORMIN (GLUCOPHAGE-XR) 500 MG 24 hr tablet    Sig: Take 1 tablet (500 mg total) by mouth daily.    Dispense:  90 tablet    Refill:  1  . metformin (FORTAMET) 1000 MG (OSM) 24 hr tablet    Sig: Take 1 tablet (1,000 mg total) by mouth 2 (two) times daily.    Dispense:  180 tablet    Refill:  1  . ramipril (ALTACE) 2.5 MG capsule     Sig: Take 1 capsule by mouth  daily    Dispense:  90 capsule    Refill:  1     BLYTH, STACEY, MD

## 2016-01-30 NOTE — Patient Instructions (Addendum)
Add the [place] 10 strain Probiotic          Hold Metformin for 2 weeks  Chronic Diarrhea  Diarrhea is frequent loose and watery bowel movements. It can cause you to feel weak and dehydrated. Dehydration can cause you to become tired and thirsty and to have a dry mouth, decreased urination, and dark yellow urine. Diarrhea is a sign of another problem, most often an infection that will not last long. In most cases, diarrhea lasts 2-3 days. Diarrhea that lasts longer than 4 weeks is called long-lasting (chronic) diarrhea. It is important to treat your diarrhea as directed by your health care provider to lessen or prevent future episodes of diarrhea.  CAUSES  There are many causes of chronic diarrhea. The following are some possible causes:   Gastrointestinal infections caused by viruses, bacteria, or parasites.   Food poisoning or food allergies.   Certain medicines, such as antibiotics, chemotherapy, and laxatives.   Artificial sweeteners and fructose.    Digestive disorders, such as celiac disease and inflammatory bowel diseases.   Irritable bowel syndrome.  Some disorders of the pancreas.  Disorders of the thyroid.  Reduced blood flow to the intestines.  Cancer. Sometimes the cause of chronic diarrhea is unknown. RISK FACTORS  Having a severely weakened immune system, such as from HIV or AIDS.   Taking certain types of cancer-fighting drugs (such as with chemotherapy) or other medicines.   Having had a recent organ transplant.   Having a portion of the stomach or small bowel removed.   Traveling to countries where food and water supplies are often contaminated.  SYMPTOMS  In addition to frequent, loose stools, diarrhea may cause:   Cramping.   Abdominal pain.   Nausea.   Fever.  Fatigue.  Urgent need to use the bathroom.  Loss of bowel control. DIAGNOSIS  Your health care provider must take a careful history and perform a physical  exam. Tests given are based on your symptoms and history. Tests may include:   Blood or stool tests. Three or more stool samples may be examined. Stool cultures may be used to test for bacteria or parasites.   X-rays.   A procedure in which a thin tube is inserted into the mouth or rectum (endoscopy). This allows the health care provider to look inside the intestine.  TREATMENT   Treatment is aimed at correcting the cause of the diarrhea when possible.  Diarrhea caused by an infection can often be treated with antibiotic medicines.  Diarrhea not caused by an infection may require you to take long-term medicine or have surgery. Specific treatment should be discussed with your health care provider.  If the cause cannot be determined, treatment aims to relieve symptoms and prevent dehydration. Serious health problems can occur if you do not maintain proper fluid levels. Treatment may include:  Taking an oral rehydration solution (ORS).  Not drinking beverages that contain caffeine (such as tea, coffee, and soft drinks).  Not drinking alcohol.  Maintaining well-balanced nutrition to help you recover faster. HOME CARE INSTRUCTIONS   Drink enough fluids to keep urine clear or pale yellow. Drink 1 cup (8 oz) of fluid for each diarrhea episode. Avoid fluids that contain simple sugars, fruit juices, whole milk products, and sodas. Hydrate with an ORS. You may purchase the ORS or prepare it at home by mixing the following ingredients together:   - tsp (1.7-3  mL) table salt.   tsp (3  mL) baking soda.  tsp (1.7 mL) salt substitute containing potassium chloride.  1 tbsp (20 mL) sugar.  4.2 c (1 L) of water.   Certain foods and beverages may increase the speed at which food moves through the gastrointestinal (GI) tract. These foods and beverages should be avoided. They include:  Caffeinated and alcoholic beverages.  High-fiber foods, such as raw fruits and vegetables, nuts,  seeds, and whole grain breads and cereals.  Foods and beverages sweetened with sugar alcohols, such as xylitol, sorbitol, and mannitol.   Some foods may be well tolerated and may help thicken stool. These include:  Starchy foods, such as rice, toast, pasta, low-sugar cereal, oatmeal, grits, baked potatoes, crackers, and bagels.  Bananas.  Applesauce.  Add probiotic-rich foods to help increase healthy bacteria in the GI tract. These include yogurt and fermented milk products.  Wash your hands well after each diarrhea episode.  Only take over-the-counter or prescription medicines as directed by your health care provider.  Take a warm bath to relieve any burning or pain from frequent diarrhea episodes. SEEK MEDICAL CARE IF:   You are not urinating as often.  Your urine is a dark color.  You become very tired or dizzy.  You have severe pain in the abdomen or rectum.  Your have blood or pus in your stools.  Your stools look black and tarry. SEEK IMMEDIATE MEDICAL CARE IF:   You are unable to keep fluids down.  You have persistent vomiting.  You have blood in your stool.  Your stools are black and tarry.  You do not urinate in 6-8 hours, or there is only a small amount of very dark urine.  You have abdominal pain that increases or localizes.  You have weakness, dizziness, confusion, or lightheadedness.  You have a severe headache.  Your diarrhea gets worse or does not get better.  You have a fever or persistent symptoms for more than 2-3 days.  You have a fever and your symptoms suddenly get worse. MAKE SURE YOU:   Understand these instructions.  Will watch your condition.  Will get help right away if you are not doing well or get worse.   This information is not intended to replace advice given to you by your health care provider. Make sure you discuss any questions you have with your health care provider.   Document Released: 03/01/2004 Document Revised:  12/15/2013 Document Reviewed: 06/04/2013 Elsevier Interactive Patient Education [employer].

## 2016-02-05 NOTE — Assessment & Plan Note (Addendum)
Encouraged heart healthy diet, increase exercise, avoid trans fats, consider a krill oil cap daily. Continue Crestor 

## 2016-02-05 NOTE — Assessment & Plan Note (Signed)
hgba1c acceptable, minimize simple carbs. Increase exercise as tolerated. Continue current meds 

## 2016-02-05 NOTE — Assessment & Plan Note (Signed)
Check stool cultures, o and p etc, encouraged to add Benefiber bid and use probiotics, seek care if worsens.

## 2016-02-07 ENCOUNTER — Other Ambulatory Visit (INDEPENDENT_AMBULATORY_CARE_PROVIDER_SITE_OTHER): Payer: Medicare Other

## 2016-02-07 DIAGNOSIS — R197 Diarrhea, unspecified: Secondary | ICD-10-CM | POA: Diagnosis not present

## 2016-02-07 DIAGNOSIS — E669 Obesity, unspecified: Secondary | ICD-10-CM | POA: Diagnosis not present

## 2016-02-07 DIAGNOSIS — R195 Other fecal abnormalities: Secondary | ICD-10-CM | POA: Diagnosis not present

## 2016-02-07 DIAGNOSIS — E119 Type 2 diabetes mellitus without complications: Secondary | ICD-10-CM

## 2016-02-07 DIAGNOSIS — R198 Other specified symptoms and signs involving the digestive system and abdomen: Secondary | ICD-10-CM | POA: Diagnosis not present

## 2016-02-07 DIAGNOSIS — E782 Mixed hyperlipidemia: Secondary | ICD-10-CM

## 2016-02-07 DIAGNOSIS — E1169 Type 2 diabetes mellitus with other specified complication: Secondary | ICD-10-CM

## 2016-02-07 LAB — LIPID PANEL
CHOLESTEROL: 125 mg/dL (ref 0–200)
HDL: 39.3 mg/dL (ref >39.00)
LDL Cholesterol: 61 mg/dL (ref 0–99)
NONHDL: 85.41
Total CHOL/HDL Ratio: 3
Triglycerides: 123 mg/dL (ref 0.0–149.0)
VLDL: 24.6 mg/dL (ref 0.0–40.0)

## 2016-02-07 LAB — COMPREHENSIVE METABOLIC PANEL
ALBUMIN: 4 g/dL (ref 3.5–5.2)
ALK PHOS: 51 U/L (ref 39–117)
ALT: 18 U/L (ref 0–35)
AST: 21 U/L (ref 0–37)
BILIRUBIN TOTAL: 0.4 mg/dL (ref 0.2–1.2)
BUN: 13 mg/dL (ref 6–23)
CO2: 28 mEq/L (ref 19–32)
Calcium: 9.2 mg/dL (ref 8.4–10.5)
Chloride: 104 mEq/L (ref 96–112)
Creatinine, Ser: 0.58 mg/dL (ref 0.40–1.20)
GFR: 107.83 mL/min (ref >60.00)
GLUCOSE: 152 mg/dL — AB (ref 70–99)
POTASSIUM: 3.9 meq/L (ref 3.5–5.1)
Sodium: 138 mEq/L (ref 135–145)
TOTAL PROTEIN: 6.7 g/dL (ref 6.0–8.3)

## 2016-02-07 LAB — HEMOGLOBIN A1C: HEMOGLOBIN A1C: 7.1 % — AB (ref 4.6–6.5)

## 2016-02-08 LAB — FECAL LACTOFERRIN, QUANT: LACTOFERRIN: POSITIVE

## 2016-02-08 LAB — CLOSTRIDIUM DIFFICILE BY PCR: Toxigenic C. Difficile by PCR: NOT DETECTED

## 2016-02-08 LAB — OVA AND PARASITE EXAMINATION: OP: NONE SEEN

## 2016-02-11 LAB — STOOL CULTURE

## 2016-03-19 ENCOUNTER — Ambulatory Visit: Payer: Medicare Other | Admitting: Family Medicine

## 2016-03-28 ENCOUNTER — Ambulatory Visit (INDEPENDENT_AMBULATORY_CARE_PROVIDER_SITE_OTHER): Payer: Medicare Other | Admitting: Family Medicine

## 2016-03-28 ENCOUNTER — Encounter: Payer: Self-pay | Admitting: Family Medicine

## 2016-03-28 VITALS — BP 108/68 | HR 75 | Temp 98.4°F | Ht 64.0 in | Wt 156.2 lb

## 2016-03-28 DIAGNOSIS — E119 Type 2 diabetes mellitus without complications: Secondary | ICD-10-CM | POA: Diagnosis not present

## 2016-03-28 DIAGNOSIS — E782 Mixed hyperlipidemia: Secondary | ICD-10-CM | POA: Diagnosis not present

## 2016-03-28 DIAGNOSIS — E663 Overweight: Secondary | ICD-10-CM | POA: Diagnosis not present

## 2016-03-28 DIAGNOSIS — E1169 Type 2 diabetes mellitus with other specified complication: Secondary | ICD-10-CM

## 2016-03-28 DIAGNOSIS — R197 Diarrhea, unspecified: Secondary | ICD-10-CM | POA: Diagnosis not present

## 2016-03-28 DIAGNOSIS — E669 Obesity, unspecified: Secondary | ICD-10-CM

## 2016-03-28 LAB — LIPID PANEL
CHOLESTEROL: 127 mg/dL (ref 0–200)
HDL: 40.2 mg/dL (ref >39.00)
LDL CALC: 62 mg/dL (ref 0–99)
NonHDL: 86.42
TRIGLYCERIDES: 123 mg/dL (ref 0.0–149.0)
Total CHOL/HDL Ratio: 3
VLDL: 24.6 mg/dL (ref 0.0–40.0)

## 2016-03-28 LAB — CBC
HCT: 37.9 % (ref 36.0–46.0)
Hemoglobin: 12.6 g/dL (ref 12.0–15.0)
MCHC: 33.2 g/dL (ref 30.0–36.0)
MCV: 86.2 fl (ref 78.0–100.0)
PLATELETS: 328 10*3/uL (ref 150.0–400.0)
RBC: 4.4 Mil/uL (ref 3.87–5.11)
RDW: 12.8 % (ref 11.5–15.5)
WBC: 7.9 10*3/uL (ref 4.0–10.5)

## 2016-03-28 LAB — HEMOGLOBIN A1C: HEMOGLOBIN A1C: 7.8 % — AB (ref 4.6–6.5)

## 2016-03-28 LAB — TSH: TSH: 2.66 u[IU]/mL (ref 0.35–4.50)

## 2016-03-28 NOTE — Assessment & Plan Note (Signed)
hgba1c acceptable, minimize simple carbs. Increase exercise as tolerated. Continue current meds 

## 2016-03-28 NOTE — Patient Instructions (Addendum)
Apply antibiotic ointment to the lesion on leg, if lesion is still open in a month then see dermatology  Basic Carbohydrate Counting for Diabetes Mellitus Carbohydrate counting is a method for keeping track of the amount of carbohydrates you eat. Eating carbohydrates naturally increases the level of sugar (glucose) in your blood, so it is important for you to know the amount that is okay for you to have in every meal. Carbohydrate counting helps keep the level of glucose in your blood within normal limits. The amount of carbohydrates allowed is different for every person. A dietitian can help you calculate the amount that is right for you. Once you know the amount of carbohydrates you can have, you can count the carbohydrates in the foods you want to eat. Carbohydrates are found in the following foods:  Grains, such as breads and cereals.  Dried beans and soy products.  Starchy vegetables, such as potatoes, peas, and corn.  Fruit and fruit juices.  Milk and yogurt.  Sweets and snack foods, such as cake, cookies, candy, chips, soft drinks, and fruit drinks. CARBOHYDRATE COUNTING There are two ways to count the carbohydrates in your food. You can use either of the methods or a combination of both. Reading the "Nutrition Facts" on [place] The "Nutrition Facts" is an area that is included on the labels of almost all packaged food and beverages in the [place]. It includes the serving size of that food or beverage and information about the nutrients in each serving of the food, including the grams (g) of carbohydrate per serving.  Decide the number of servings of this food or beverage that you will be able to eat or drink. Multiply that number of servings by the number of grams of carbohydrate that is listed on the label for that serving. The total will be the amount of carbohydrates you will be having when you eat or drink this food or beverage. Learning Standard Serving Sizes of  Food When you eat food that is not packaged or does not include "Nutrition Facts" on the label, you need to measure the servings in order to count the amount of carbohydrates.A serving of most carbohydrate-rich foods contains about 15 g of carbohydrates. The following list includes serving sizes of carbohydrate-rich foods that provide 15 g ofcarbohydrate per serving:   1 slice of bread (1 oz) or 1 six-inch tortilla.    of a hamburger bun or English muffin.  4-6 crackers.   cup unsweetened dry cereal.    cup hot cereal.   cup rice or pasta.    cup mashed potatoes or  of a large baked potato.  1 cup fresh fruit or one small piece of fruit.    cup canned or frozen fruit or fruit juice.  1 cup milk.   cup plain fat-free yogurt or yogurt sweetened with artificial sweeteners.   cup cooked dried beans or starchy vegetable, such as peas, corn, or potatoes.  Decide the number of standard-size servings that you will eat. Multiply that number of servings by 15 (the grams of carbohydrates in that serving). For example, if you eat 2 cups of strawberries, you will have eaten 2 servings and 30 g of carbohydrates (2 servings x 15 g = 30 g). For foods such as soups and casseroles, in which more than one food is mixed in, you will need to count the carbohydrates in each food that is included. EXAMPLE OF CARBOHYDRATE COUNTING Sample Dinner  3 oz chicken breast.  cup of brown rice.   cup of corn.  1 cup milk.   1 cup strawberries with sugar-free whipped topping.  Carbohydrate Calculation Step 1: Identify the foods that contain carbohydrates:   Rice.   Corn.   Milk.   Strawberries. Step 2:Calculate the number of servings eaten of each:   2 servings of rice.   1 serving of corn.   1 serving of milk.   1 serving of strawberries. Step 3: Multiply each of those number of servings by 15 g:   2 servings of rice x 15 g = 30 g.   1 serving of corn x 15 g  = 15 g.   1 serving of milk x 15 g = 15 g.   1 serving of strawberries x 15 g = 15 g. Step 4: Add together all of the amounts to find the total grams of carbohydrates eaten: 30 g + 15 g + 15 g + 15 g = 75 g.   This information is not intended to replace advice given to you by your health care provider. Make sure you discuss any questions you have with your health care provider.   Document Released: 12/10/2005 Document Revised: 12/31/2014 Document Reviewed: 11/06/2013 Elsevier Interactive Patient Education [employer].

## 2016-03-28 NOTE — Progress Notes (Signed)
Pre visit review using our clinic review tool, if applicable. No additional management support is needed unless otherwise documented below in the visit note. 

## 2016-03-28 NOTE — Assessment & Plan Note (Signed)
Encouraged heart healthy diet, increase exercise, avoid trans fats, consider a krill oil cap daily 

## 2016-03-28 NOTE — Assessment & Plan Note (Signed)
Struggling with 2015, stopped Metformin for two weeks, diarrhea got better then restarted and diarrhea returned at 1000 mg daily the symptoms are acceptable

## 2016-04-02 ENCOUNTER — Other Ambulatory Visit: Payer: Self-pay | Admitting: Family Medicine

## 2016-04-15 NOTE — Progress Notes (Signed)
Patient ID: Gail Lester, female   DOB: 05/30/1941, 75 y.o.   MRN: [id number]   Subjective:    Patient ID: Gail Lester, female    DOB: 1941-10-18, 75 y.o.   MRN: [id number]  Chief Complaint  Patient presents with  . Diabetes    follow up to discuss Metformin    HPI Patient is in today for follow up. No recent illness. Her diarrhea is improved. She is only taking her Fortamet once a day. No other acute concerns. Denies CP/palp/SOB/HA/congestion/fevers/GI or GU c/o. Taking meds as prescribed  Past Medical History  Diagnosis Date  . Diabetes mellitus   . Hypercholesteremia   . Arthritis   . Diabetes ([place])   . Chicken pox as a child  . Measles as a child  . Mumps as a child  . Diarrhea 02/10/2013  . Other and unspecified hyperlipidemia 02/10/2013  . Fibrocystic breast 02/10/2013    Left h/o FN biopsy   . Dupuytren's contracture of both hands 02/10/2013  . Other malaise and fatigue 08/04/2013  . Encounter for Medicare annual wellness exam 08/04/2013  . Overweight 06/14/2014  . Ganglion cyst 11/14/2014    Right 2nd finger  . Left arm pain 11/14/2014  . Diabetes mellitus type 2 in obese [facility]) 02/10/2013    Saw Dr Delman Cheadle, eye doctor in 01/01/13, exam in chart   . Cough 03/24/2015  . Keratosis, seborrheic 08/01/2015  . 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 Medicare annual wellness visit, subsequent 11/06/2015    Past Surgical History  Procedure Laterality Date  . Appendectomy  1955  . Orif wrist fracture  12/09/2011    Procedure: OPEN REDUCTION INTERNAL FIXATION (ORIF) WRIST FRACTURE;  Surgeon: Linna Hoff;  Location: [place];  Service: Orthopedics;  Laterality: Left;  . Breast biopsy  1985-1990    benign    Family History  Problem Relation Age of Onset  . Colon cancer      grandparent  . Kidney disease Mother   . Diabetes Father   . Kidney disease Maternal Grandmother   . Cancer Maternal Grandmother     bladder  . Cancer Maternal Aunt   . Alzheimer's disease Maternal  Aunt   . Scoliosis Son     Social History   Social History  . Marital Status: Divorced    Spouse Name: N/A  . Number of Children: N/A  . Years of Education: N/A   Occupational History  . Not on file.   Social History Main Topics  . Smoking status: Former Smoker    Quit date: 11/17/1989  . Smokeless tobacco: Not on file  . Alcohol Use: No  . Drug Use: No  . Sexual Activity: No   Other Topics Concern  . Not on file   Social History Narrative    Outpatient Prescriptions Prior to Visit  Medication Sig Dispense Refill  . ACCU-CHEK SOFTCLIX LANCETS lancets Check blood sugar twice daily.  DX E11.9 200 each 6  . aspirin 81 MG tablet Take 81 mg by mouth daily.    Marland Kitchen b complex vitamins tablet Take 1 tablet by mouth as directed. Two times a week    . Blood Glucose Monitoring Suppl (ACCU-CHEK AVIVA PLUS) W/DEVICE KIT Use as directed 1 kit 0  . CALCIUM PO Take 400 mg by mouth daily.    . Cholecalciferol (D-3-5) 5000 UNITS capsule Take 4,000 Units by mouth daily.     . Coenzyme Q10 (COQ10) 100 MG CAPS Take 1 capsule  by mouth daily.    Marland Kitchen glucose blood (ACCU-CHEK AVIVA PLUS) test strip Check Blood Sugar bid 300 each 12  . Lutein 20 MG CAPS Take by mouth.    Marland Kitchen MAGNESIUM PO Take 250 mg by mouth daily.     . metformin (FORTAMET) 1000 MG (OSM) 24 hr tablet Take 1 tablet (1,000 mg total) by mouth 2 (two) times daily. 180 tablet 1  . Multiple Vitamin (MULTIVITAMIN) tablet Take 1 tablet by mouth as directed. Two times a week    . Probiotic Product (PROBIOTIC DAILY PO) Take 1 capsule by mouth daily.    . ramipril (ALTACE) 2.5 MG capsule Take 1 capsule by mouth  daily 90 capsule 1  . rosuvastatin (CRESTOR) 20 MG tablet Take one-half tablet by  mouth daily 45 tablet 2  . Turmeric 500 MG CAPS Take by mouth daily.    . vitamin C (ASCORBIC ACID) 500 MG tablet Take 500 mg by mouth 2 (two) times daily.    Marland Kitchen glimepiride (AMARYL) 4 MG tablet Take 1 tablet (4 mg total) by mouth 2 (two) times daily. Dx  Code: 250.00 180 tablet 2  . metFORMIN (GLUCOPHAGE-XR) 500 MG 24 hr tablet Take 1 tablet (500 mg total) by mouth daily. (Patient not taking: Reported on 03/28/2016) 90 tablet 1   No facility-administered medications prior to visit.    Allergies  Allergen Reactions  . Lipitor [Atorvastatin]     Diarrhea, muscle cramps  . Lisinopril     myalgias    Review of Systems  Constitutional: Negative for fever and malaise/fatigue.  HENT: Negative for congestion.   Eyes: Negative for blurred vision.  Respiratory: Negative for shortness of breath.   Cardiovascular: Negative for chest pain, palpitations and leg swelling.  Gastrointestinal: Negative for nausea, abdominal pain and blood in stool.  Genitourinary: Negative for dysuria and frequency.  Musculoskeletal: Negative for falls.  Skin: Negative for rash.  Neurological: Negative for dizziness, loss of consciousness and headaches.  Endo/Heme/Allergies: Negative for environmental allergies.  Psychiatric/Behavioral: Negative for depression. The patient is not nervous/anxious.        Objective:    Physical Exam  Constitutional: She is oriented to person, place, and time. She appears well-developed and well-nourished. No distress.  HENT:  Head: Normocephalic and atraumatic.  Nose: Nose normal.  Eyes: Right eye exhibits no discharge. Left eye exhibits no discharge.  Neck: Normal range of motion. Neck supple.  Cardiovascular: Normal rate and regular rhythm.   No murmur heard. Pulmonary/Chest: Effort normal and breath sounds normal.  Abdominal: Soft. Bowel sounds are normal. There is no tenderness.  Musculoskeletal: She exhibits no edema.  Neurological: She is alert and oriented to person, place, and time.  Skin: Skin is warm and dry.  Psychiatric: She has a normal mood and affect.  Nursing note and vitals reviewed.   BP 108/68 mmHg  Pulse 75  Temp(Src) 98.4 F (36.9 C) (Oral)  Ht _0  (1.626 m)  Wt 156 lb 3.2 oz (70.852 kg)  BMI  26.80 kg/m2  SpO2 97% Wt Readings from Last 3 Encounters:  03/28/16 156 lb 3.2 oz (70.852 kg)  01/30/16 158 lb 8 oz (71.895 kg)  10/24/15 162 lb (73.483 kg)     Lab Results  Component Value Date   WBC 7.9 03/28/2016   HGB 12.6 03/28/2016   HCT 37.9 03/28/2016   PLT 328.0 03/28/2016   GLUCOSE 152* 02/07/2016   CHOL 127 03/28/2016   TRIG 123.0 03/28/2016   HDL 40.20 03/28/2016  LDLDIRECT 109.7 11/24/2012   LDLCALC 62 03/28/2016   ALT 18 02/07/2016   AST 21 02/07/2016   NA 138 02/07/2016   K 3.9 02/07/2016   CL 104 02/07/2016   CREATININE 0.58 02/07/2016   BUN 13 02/07/2016   CO2 28 02/07/2016   TSH 2.66 03/28/2016   HGBA1C 7.8* 03/28/2016   MICROALBUR 1.6 11/03/2015    Lab Results  Component Value Date   TSH 2.66 03/28/2016   Lab Results  Component Value Date   WBC 7.9 03/28/2016   HGB 12.6 03/28/2016   HCT 37.9 03/28/2016   MCV 86.2 03/28/2016   PLT 328.0 03/28/2016   Lab Results  Component Value Date   NA 138 02/07/2016   K 3.9 02/07/2016   CO2 28 02/07/2016   GLUCOSE 152* 02/07/2016   BUN 13 02/07/2016   CREATININE 0.58 02/07/2016   BILITOT 0.4 02/07/2016   ALKPHOS 51 02/07/2016   AST 21 02/07/2016   ALT 18 02/07/2016   PROT 6.7 02/07/2016   ALBUMIN 4.0 02/07/2016   CALCIUM 9.2 02/07/2016   GFR 107.83 02/07/2016   Lab Results  Component Value Date   CHOL 127 03/28/2016   Lab Results  Component Value Date   HDL 40.20 03/28/2016   Lab Results  Component Value Date   LDLCALC 62 03/28/2016   Lab Results  Component Value Date   TRIG 123.0 03/28/2016   Lab Results  Component Value Date   CHOLHDL 3 03/28/2016   Lab Results  Component Value Date   HGBA1C 7.8* 03/28/2016       Assessment & Plan:   Problem List Items Addressed This Visit    Diabetes mellitus type 2 in obese ([place])    hgba1c acceptable, minimize simple carbs. Increase exercise as tolerated. Continue current meds      Relevant Orders   TSH (Completed)   CBC  (Completed)   Hemoglobin A1c (Completed)   Lipid panel (Completed)   Diarrhea    Struggling with 2015, stopped Metformin for two weeks, diarrhea got better then restarted and diarrhea returned at 1000 mg daily the symptoms are acceptable      Relevant Orders   TSH (Completed)   CBC (Completed)   Hemoglobin A1c (Completed)   Lipid panel (Completed)   Hyperlipidemia, mixed - Primary    Encouraged heart healthy diet, increase exercise, avoid trans fats, consider a krill oil cap daily      Relevant Orders   TSH (Completed)   CBC (Completed)   Hemoglobin A1c (Completed)   Lipid panel (Completed)   Overweight    Doing well with weight loss, continue heart healthy diet.          I am having Ms. Ekstein maintain her aspirin, Cholecalciferol, Lutein, b complex vitamins, multivitamin, MAGNESIUM PO, CALCIUM PO, vitamin C, Probiotic Product (PROBIOTIC DAILY PO), CoQ10, Turmeric, rosuvastatin, ACCU-CHEK AVIVA PLUS, glucose blood, ACCU-CHEK SOFTCLIX LANCETS, metFORMIN, metformin, and ramipril.  No orders of the defined types were placed in this encounter.     Penni Homans, MD

## 2016-04-15 NOTE — Assessment & Plan Note (Signed)
Doing well with weight loss, continue heart healthy diet.

## 2016-06-01 ENCOUNTER — Other Ambulatory Visit: Payer: Self-pay | Admitting: Family Medicine

## 2016-06-26 ENCOUNTER — Other Ambulatory Visit: Payer: Self-pay | Admitting: Family Medicine

## 2016-07-02 ENCOUNTER — Ambulatory Visit (INDEPENDENT_AMBULATORY_CARE_PROVIDER_SITE_OTHER): Payer: Medicare Other | Admitting: Family Medicine

## 2016-07-02 DIAGNOSIS — R197 Diarrhea, unspecified: Secondary | ICD-10-CM | POA: Diagnosis not present

## 2016-07-02 DIAGNOSIS — E119 Type 2 diabetes mellitus without complications: Secondary | ICD-10-CM | POA: Diagnosis not present

## 2016-07-02 DIAGNOSIS — E663 Overweight: Secondary | ICD-10-CM

## 2016-07-02 DIAGNOSIS — E669 Obesity, unspecified: Secondary | ICD-10-CM

## 2016-07-02 DIAGNOSIS — E782 Mixed hyperlipidemia: Secondary | ICD-10-CM | POA: Diagnosis not present

## 2016-07-02 DIAGNOSIS — E1169 Type 2 diabetes mellitus with other specified complication: Secondary | ICD-10-CM

## 2016-07-02 LAB — LIPID PANEL
Cholesterol: 143 mg/dL (ref 0–200)
HDL: 40 mg/dL (ref >39.00)
LDL Cholesterol: 72 mg/dL (ref 0–99)
NonHDL: 103.27
Total CHOL/HDL Ratio: 4
Triglycerides: 158 mg/dL — ABNORMAL HIGH (ref 0.0–149.0)
VLDL: 31.6 mg/dL (ref 0.0–40.0)

## 2016-07-02 LAB — CBC
HEMATOCRIT: 39.3 % (ref 36.0–46.0)
HEMOGLOBIN: 12.9 g/dL (ref 12.0–15.0)
MCHC: 33 g/dL (ref 30.0–36.0)
MCV: 86.1 fl (ref 78.0–100.0)
PLATELETS: 333 10*3/uL (ref 150.0–400.0)
RBC: 4.56 Mil/uL (ref 3.87–5.11)
RDW: 14 % (ref 11.5–15.5)
WBC: 7.2 10*3/uL (ref 4.0–10.5)

## 2016-07-02 LAB — COMPREHENSIVE METABOLIC PANEL
ALT: 16 U/L (ref 0–35)
AST: 19 U/L (ref 0–37)
Albumin: 4.1 g/dL (ref 3.5–5.2)
Alkaline Phosphatase: 55 U/L (ref 39–117)
BUN: 17 mg/dL (ref 6–23)
CO2: 25 mEq/L (ref 19–32)
Calcium: 9.4 mg/dL (ref 8.4–10.5)
Chloride: 106 mEq/L (ref 96–112)
Creatinine, Ser: 0.57 mg/dL (ref 0.40–1.20)
GFR: 109.9 mL/min (ref >60.00)
Glucose, Bld: 146 mg/dL — ABNORMAL HIGH (ref 70–99)
Potassium: 4 mEq/L (ref 3.5–5.1)
Sodium: 139 mEq/L (ref 135–145)
Total Bilirubin: 0.5 mg/dL (ref 0.2–1.2)
Total Protein: 7.1 g/dL (ref 6.0–8.3)

## 2016-07-02 LAB — TSH: TSH: 2.89 u[IU]/mL (ref 0.35–4.50)

## 2016-07-02 LAB — HEMOGLOBIN A1C: Hgb A1c MFr Bld: 7.8 % — ABNORMAL HIGH (ref 4.6–6.5)

## 2016-07-02 MED ORDER — ROSUVASTATIN CALCIUM 20 MG PO TABS: ORAL_TABLET | ORAL | Status: DC

## 2016-07-02 MED ORDER — METFORMIN HCL ER 500 MG PO TB24: 500.0000 mg | ORAL_TABLET | Freq: Three times a day (TID) | ORAL | Status: DC | PRN

## 2016-07-02 MED ORDER — ACCU-CHEK AVIVA PLUS VI STRP: ORAL_STRIP | Status: DC

## 2016-07-02 MED ORDER — LOSARTAN POTASSIUM 25 MG PO TABS: 25.0000 mg | ORAL_TABLET | Freq: Every day | ORAL | Status: DC

## 2016-07-02 NOTE — Assessment & Plan Note (Signed)
Improved since stopping Ramipirl last month. Will try switching to Losartan

## 2016-07-02 NOTE — Assessment & Plan Note (Addendum)
minimize simple carbs. Increase exercise as tolerated. Continue current meds in this regard for now. Referred to nutritionist today

## 2016-07-02 NOTE — Patient Instructions (Signed)

## 2016-07-02 NOTE — Assessment & Plan Note (Signed)
Encouraged heart healthy diet, increase exercise, avoid trans fats, consider a krill oil cap daily 

## 2016-07-02 NOTE — Progress Notes (Signed)
Patient ID: Gail Lester, female   DOB: 01/22/1941, 75 y.o.   MRN: [id number]   Subjective:    Patient ID: Gail Lester, female    DOB: November 04, 1941, 75 y.o.   MRN: [id number]  No chief complaint on file.   HPI Patient is in today for follow up. Is doing well today. Stopped Ramipril a month ago and has had no diarrhea since. Not taking metformin evening due to some low sugars of 40 and 50 in am. Since stopping evening Metformin is not feeling woozey and sugars are up. Denies CP/palp/SOB/HA/congestion/fevers/GI or GU c/o. Taking meds as prescribed. Runs in 200s during the day after eating. No polyuria and polydipsia.   Past Medical History  Diagnosis Date  . Diabetes mellitus   . Hypercholesteremia   . Arthritis   . Diabetes ([place])   . Chicken pox as a child  . Measles as a child  . Mumps as a child  . Diarrhea 02/10/2013  . Other and unspecified hyperlipidemia 02/10/2013  . Fibrocystic breast 02/10/2013    Left h/o FN biopsy   . Dupuytren's contracture of both hands 02/10/2013  . Other malaise and fatigue 08/04/2013  . Encounter for Medicare annual wellness exam 08/04/2013  . Overweight 06/14/2014  . Ganglion cyst 11/14/2014    Right 2nd finger  . Left arm pain 11/14/2014  . Diabetes mellitus type 2 in obese [facility]) 02/10/2013    Saw Dr Delman Cheadle, eye doctor in 01/01/13, exam in chart   . Cough 03/24/2015  . Keratosis, seborrheic 08/01/2015  . 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 Medicare annual wellness visit, subsequent 11/06/2015    Past Surgical History  Procedure Laterality Date  . Appendectomy  1955  . Orif wrist fracture  12/09/2011    Procedure: OPEN REDUCTION INTERNAL FIXATION (ORIF) WRIST FRACTURE;  Surgeon: Linna Hoff;  Location: [place];  Service: Orthopedics;  Laterality: Left;  . Breast biopsy  1985-1990    benign    Family History  Problem Relation Age of Onset  . Colon cancer      grandparent  . Kidney disease Mother   . Diabetes Father   . Kidney disease  Maternal Grandmother   . Cancer Maternal Grandmother     bladder  . Cancer Maternal Aunt   . Alzheimer's disease Maternal Aunt   . Scoliosis Son     Social History   Social History  . Marital Status: Divorced    Spouse Name: N/A  . Number of Children: N/A  . Years of Education: N/A   Occupational History  . Not on file.   Social History Main Topics  . Smoking status: Former Smoker    Quit date: 11/17/1989  . Smokeless tobacco: Not on file  . Alcohol Use: No  . Drug Use: No  . Sexual Activity: No   Other Topics Concern  . Not on file   Social History Narrative    Outpatient Prescriptions Prior to Visit  Medication Sig Dispense Refill  . ACCU-CHEK SOFTCLIX LANCETS lancets Check blood sugar twice daily.  DX E11.9 200 each 6  . aspirin 81 MG tablet Take 81 mg by mouth daily.    Marland Kitchen b complex vitamins tablet Take 1 tablet by mouth as directed. Two times a week    . Blood Glucose Monitoring Suppl (ACCU-CHEK AVIVA PLUS) W/DEVICE KIT Use as directed 1 kit 0  . CALCIUM PO Take 400 mg by mouth daily.    . Cholecalciferol (D-3-5) 5000  UNITS capsule Take 4,000 Units by mouth daily.     . Coenzyme Q10 (COQ10) 100 MG CAPS Take 1 capsule by mouth daily.    Marland Kitchen glimepiride (AMARYL) 4 MG tablet Take 1 tablet by mouth two  times daily 180 tablet 1  . Lutein 20 MG CAPS Take by mouth.    Marland Kitchen MAGNESIUM PO Take 250 mg by mouth daily.     . Multiple Vitamin (MULTIVITAMIN) tablet Take 1 tablet by mouth as directed. Two times a week    . Probiotic Product (PROBIOTIC DAILY PO) Take 1 capsule by mouth daily.    . Turmeric 500 MG CAPS Take by mouth daily.    . vitamin C (ASCORBIC ACID) 500 MG tablet Take 500 mg by mouth 2 (two) times daily.    . CRESTOR 20 MG tablet Take one-half tablet by  mouth daily 45 tablet 2  . glucose blood (ACCU-CHEK AVIVA PLUS) test strip Check Blood Sugar bid 300 each 12  . metformin (FORTAMET) 1000 MG (OSM) 24 hr tablet Take 1 tablet (1,000 mg total) by mouth 2 (two)  times daily. 180 tablet 1  . metFORMIN (GLUCOPHAGE-XR) 500 MG 24 hr tablet Take 1 tablet (500 mg total) by mouth daily. (Patient not taking: Reported on 03/28/2016) 90 tablet 1  . ramipril (ALTACE) 2.5 MG capsule Take 1 capsule by mouth  daily 90 capsule 1   No facility-administered medications prior to visit.    Allergies  Allergen Reactions  . Ramipril Diarrhea  . Lipitor [Atorvastatin]     Diarrhea, muscle cramps  . Lisinopril     myalgias    Review of Systems  Constitutional: Negative for fever and malaise/fatigue.  HENT: Negative for congestion.   Eyes: Negative for blurred vision.  Respiratory: Negative for shortness of breath.   Cardiovascular: Negative for chest pain, palpitations and leg swelling.  Gastrointestinal: Negative for nausea, abdominal pain and blood in stool.  Genitourinary: Negative for dysuria and frequency.  Musculoskeletal: Negative for falls.  Skin: Negative for rash.  Neurological: Negative for dizziness, loss of consciousness and headaches.  Endo/Heme/Allergies: Negative for environmental allergies.  Psychiatric/Behavioral: Negative for depression. The patient is not nervous/anxious.        Objective:    Physical Exam  Constitutional: She is oriented to person, place, and time. She appears well-developed and well-nourished. No distress.  HENT:  Head: Normocephalic and atraumatic.  Nose: Nose normal.  Eyes: Right eye exhibits no discharge. Left eye exhibits no discharge.  Neck: Normal range of motion. Neck supple.  Cardiovascular: Normal rate and regular rhythm.   No murmur heard. Pulmonary/Chest: Effort normal and breath sounds normal.  Abdominal: Soft. Bowel sounds are normal. There is no tenderness.  Musculoskeletal: She exhibits no edema.  Neurological: She is alert and oriented to person, place, and time.  Skin: Skin is warm and dry.  Psychiatric: She has a normal mood and affect.  Nursing note and vitals reviewed.   There were no  vitals taken for this visit. Wt Readings from Last 3 Encounters:  03/28/16 156 lb 3.2 oz (70.852 kg)  01/30/16 158 lb 8 oz (71.895 kg)  10/24/15 162 lb (73.483 kg)     Lab Results  Component Value Date   WBC 7.9 03/28/2016   HGB 12.6 03/28/2016   HCT 37.9 03/28/2016   PLT 328.0 03/28/2016   GLUCOSE 152* 02/07/2016   CHOL 127 03/28/2016   TRIG 123.0 03/28/2016   HDL 40.20 03/28/2016   LDLDIRECT 109.7 11/24/2012  LDLCALC 62 03/28/2016   ALT 18 02/07/2016   AST 21 02/07/2016   NA 138 02/07/2016   K 3.9 02/07/2016   CL 104 02/07/2016   CREATININE 0.58 02/07/2016   BUN 13 02/07/2016   CO2 28 02/07/2016   TSH 2.66 03/28/2016   HGBA1C 7.8* 03/28/2016   MICROALBUR 1.6 11/03/2015    Lab Results  Component Value Date   TSH 2.66 03/28/2016   Lab Results  Component Value Date   WBC 7.9 03/28/2016   HGB 12.6 03/28/2016   HCT 37.9 03/28/2016   MCV 86.2 03/28/2016   PLT 328.0 03/28/2016   Lab Results  Component Value Date   NA 138 02/07/2016   K 3.9 02/07/2016   CO2 28 02/07/2016   GLUCOSE 152* 02/07/2016   BUN 13 02/07/2016   CREATININE 0.58 02/07/2016   BILITOT 0.4 02/07/2016   ALKPHOS 51 02/07/2016   AST 21 02/07/2016   ALT 18 02/07/2016   PROT 6.7 02/07/2016   ALBUMIN 4.0 02/07/2016   CALCIUM 9.2 02/07/2016   GFR 107.83 02/07/2016   Lab Results  Component Value Date   CHOL 127 03/28/2016   Lab Results  Component Value Date   HDL 40.20 03/28/2016   Lab Results  Component Value Date   LDLCALC 62 03/28/2016   Lab Results  Component Value Date   TRIG 123.0 03/28/2016   Lab Results  Component Value Date   CHOLHDL 3 03/28/2016   Lab Results  Component Value Date   HGBA1C 7.8* 03/28/2016       Assessment & Plan:   Problem List Items Addressed This Visit    Diabetes mellitus type 2 in obese ([facility])    minimize simple carbs. Increase exercise as tolerated. Continue current meds in this regard for now. Referred to nutritionist today       Relevant Medications   metFORMIN (GLUCOPHAGE-XR) 500 MG 24 hr tablet   losartan (COZAAR) 25 MG tablet   rosuvastatin (CRESTOR) 20 MG tablet   Other Relevant Orders   TSH   CBC   Hemoglobin A1c   Comprehensive metabolic panel   Lipid panel   Amb ref to Medical Nutrition Therapy-MNT   Hyperlipidemia, mixed - Primary    Encouraged heart healthy diet, increase exercise, avoid trans fats, consider a krill oil cap daily      Relevant Medications   losartan (COZAAR) 25 MG tablet   rosuvastatin (CRESTOR) 20 MG tablet   Other Relevant Orders   TSH   CBC   Hemoglobin A1c   Comprehensive metabolic panel   Lipid panel   Diarrhea    Improved since stopping Ramipirl last month. Will try switching to Losartan      Relevant Orders   TSH   CBC   Hemoglobin A1c   Comprehensive metabolic panel   Lipid panel   Overweight    Encouraged DASH diet, decrease po intake and increase exercise as tolerated. Needs 7-8 hours of sleep nightly. Avoid trans fats, eat small, frequent meals every 4-5 hours with lean proteins, complex carbs and healthy fats. Minimize simple carbs      Relevant Orders   TSH   CBC   Hemoglobin A1c   Comprehensive metabolic panel   Lipid panel      I have discontinued Ms. Alioto's metformin and ramipril. I have changed her glucose blood to ACCU-CHEK AVIVA PLUS and CRESTOR to rosuvastatin. I have also changed her metFORMIN. Additionally, I am having her start on losartan. Lastly, I am having  her maintain her aspirin, Cholecalciferol, Lutein, b complex vitamins, multivitamin, MAGNESIUM PO, CALCIUM PO, vitamin C, Probiotic Product (PROBIOTIC DAILY PO), CoQ10, Turmeric, ACCU-CHEK AVIVA PLUS, ACCU-CHEK SOFTCLIX LANCETS, and glimepiride.  Meds ordered this encounter  Medications  . metFORMIN (GLUCOPHAGE-XR) 500 MG 24 hr tablet    Sig: Take 1 tablet (500 mg total) by mouth 3 (three) times daily as needed.    Dispense:  270 tablet    Refill:  1  . losartan (COZAAR) 25 MG  tablet    Sig: Take 1 tablet (25 mg total) by mouth daily.    Dispense:  90 tablet    Refill:  1  . ACCU-CHEK AVIVA PLUS test strip    Sig: Check Blood Sugar QID and prn labile blood sugars    Dispense:  450 each    Refill:  1    DX E11.90  . rosuvastatin (CRESTOR) 20 MG tablet    Sig: Take one-half tablet by  mouth daily    Dispense:  45 tablet    Refill:  1     Julee Stoll, MD

## 2016-07-02 NOTE — Assessment & Plan Note (Signed)
Encouraged DASH diet, decrease po intake and increase exercise as tolerated. Needs 7-8 hours of sleep nightly. Avoid trans fats, eat small, frequent meals every 4-5 hours with lean proteins, complex carbs and healthy fats. Minimize simple carbs 

## 2016-07-25 ENCOUNTER — Encounter: Payer: Self-pay | Admitting: Skilled Nursing Facility1

## 2016-07-25 ENCOUNTER — Encounter: Payer: Medicare Other | Attending: Family Medicine | Admitting: Skilled Nursing Facility1

## 2016-07-25 DIAGNOSIS — E1169 Type 2 diabetes mellitus with other specified complication: Secondary | ICD-10-CM

## 2016-07-25 DIAGNOSIS — E119 Type 2 diabetes mellitus without complications: Secondary | ICD-10-CM | POA: Diagnosis not present

## 2016-07-25 DIAGNOSIS — E669 Obesity, unspecified: Secondary | ICD-10-CM

## 2016-07-25 DIAGNOSIS — Z6827 Body mass index (BMI) 27.0-27.9, adult: Secondary | ICD-10-CM | POA: Diagnosis not present

## 2016-07-25 DIAGNOSIS — Z713 Dietary counseling and surveillance: Secondary | ICD-10-CM | POA: Diagnosis not present

## 2016-07-25 NOTE — Progress Notes (Signed)
Diabetes Self-Management Education  Visit Type: First/Initial  Appt. Start Time: 2:30Appt. End Time: 3:52  07/25/2016  [name], identified by name and date of birth, is a 75 y.o. female with a diagnosis of Diabetes: Type 2.   ASSESSMENT  Height 5\' 4"  (1.626 m), weight 160 lb (72.6 kg). Body mass index is 27.46 kg/m.  Pt prefers to be called [name].The pt has kept a Detailed record of her blood sugar numbers.Pt states she can tell when her sugars are not right but does not know if it is high or low. Pt experiences Symptomatic hypoglycemia frequently  when she first wakes up and in the afternoon sometimes 63, 62, 64, 60, 64, 63, 56, 44, 48, 54, 50, 49, 63, 61, 49, 54. Symptoms include: Dizziness and her eyes are effected   These low symptoms happen while driving often.  Pt states she feels the best at 80.      Diabetes Self-Management Education - 07/25/16 1434      Visit Information   Visit Type First/Initial     Initial Visit   Diabetes Type Type 2   Are you currently following a meal plan? No   Are you taking your medications as prescribed? Yes   Date Diagnosed 15 years ago     Health Coping   How would you rate your overall health? Fair     Psychosocial Assessment   Patient Belief/Attitude about Diabetes Motivated to manage diabetes   Self-care barriers None   Self-management support Family     Pre-Education Assessment   Patient understands the diabetes disease and treatment process. Needs Instruction   Patient understands incorporating nutritional management into lifestyle. Needs Instruction   Patient undertands incorporating physical activity into lifestyle. Needs Instruction   Patient understands using medications safely. Needs Instruction   Patient understands monitoring blood glucose, interpreting and using results Needs Instruction   Patient understands prevention, detection, and treatment of acute complications. Needs Instruction   Patient understands  prevention, detection, and treatment of chronic complications. Needs Instruction   Patient understands how to develop strategies to address psychosocial issues. Needs Instruction   Patient understands how to develop strategies to promote health/change behavior. Needs Instruction     Complications   Last HgB A1C per patient/outside source 7.8 %   How often do you check your blood sugar? --  as often as she feels off 1-5 times    Number of hypoglycemic episodes per month 16   Can you tell when your blood sugar is low? Yes   What do you do if your blood sugar is low? eats somthing   Number of hyperglycemic episodes per week 1   Can you tell when your blood sugar is high? Yes   What do you do if your blood sugar is high? jog in place for 10 minutes   Have you had a dilated eye exam in the past 12 months? Yes   Have you had a dental exam in the past 12 months? Yes   Are you checking your feet? Yes   How many days per week are you checking your feet? 7     Dietary Intake   Breakfast chicken and watermelon   Lunch salad and nuts with fruit   Snack (afternoon) fruit   Dinner beans or meat----soup   Snack (evening) apple or a couple cookies   Beverage(s) water, coffee, ice tea     Exercise   Exercise Type Light (walking / raking leaves)  How many days per week to you exercise? 7   How many minutes per day do you exercise? 60   Total minutes per week of exercise 420     Patient Education   Previous Diabetes Education No   Disease state  Definition of diabetes, type 1 and 2, and the diagnosis of diabetes;Factors that contribute to the development of diabetes   Nutrition management  Role of diet in the treatment of diabetes and the relationship between the three main macronutrients and blood glucose level;Food label reading, portion sizes and measuring food.;Carbohydrate counting;Reviewed blood glucose goals for pre and post meals and how to evaluate the patients' food intake on their blood  glucose level.;Meal timing in regards to the patients' current diabetes medication.;Information on hints to eating out and maintain blood glucose control.;Meal options for control of blood glucose level and chronic complications.   Physical activity and exercise  Role of exercise on diabetes management, blood pressure control and cardiac health.;Identified with patient nutritional and/or medication changes necessary with exercise.   Monitoring Purpose and frequency of SMBG.;Yearly dilated eye exam;Daily foot exams;Identified appropriate SMBG and/or A1C goals.;Taught/discussed recording of test results and interpretation of SMBG.   Acute complications Taught treatment of hypoglycemia - the 15 rule.;Discussed and identified patients' treatment of hyperglycemia.   Chronic complications Lipid levels, blood glucose control and heart disease;Dental care;Retinopathy and reason for yearly dilated eye exams;Assessed and discussed foot care and prevention of foot problems   Psychosocial adjustment Worked with patient to identify barriers to care and solutions     Individualized Goals (developed by patient)   Nutrition Follow meal plan discussed;Adjust meds/carbs with exercise as discussed   Physical Activity Exercise 5-7 days per week;30 minutes per day     Post-Education Assessment   Patient understands the diabetes disease and treatment process. Demonstrates understanding / competency   Patient understands incorporating nutritional management into lifestyle. Demonstrates understanding / competency   Patient undertands incorporating physical activity into lifestyle. Demonstrates understanding / competency   Patient understands using medications safely. Demonstrates understanding / competency   Patient understands monitoring blood glucose, interpreting and using results Demonstrates understanding / competency   Patient understands prevention, detection, and treatment of acute complications. Demonstrates  understanding / competency   Patient understands prevention, detection, and treatment of chronic complications. Demonstrates understanding / competency   Patient understands how to develop strategies to address psychosocial issues. Demonstrates understanding / competency   Patient understands how to develop strategies to promote health/change behavior. Demonstrates understanding / competency     Outcomes   Expected Outcomes Demonstrated interest in learning. Expect positive outcomes   Future DMSE PRN   Program Status Completed      Individualized Plan for Diabetes Self-Management Training:   Learning Objective:  Patient will have a greater understanding of diabetes self-management. Patient education plan is to attend individual and/or group sessions per assessed needs and concerns.   Plan:   Patient Instructions  -Speak to your physician about your glimepiride causing sugar lows in the morning  -Take your glucometer everywhere you go -Use a different finger every time you check your sugar -Proper needle disposal:  Do NOT throw them in the toilet  Only use hard plastic containers for example: your laundry deter gant bottle  When your bottle is full make sure the top is securely fastened and tape it down and then write DO NOT RECYCLE on the outside of the bottle -Do NOT use the same needle to prick more than once -  Meal ideas: half a banana, one slice of bread spread with peanut butter   Open faced sandwich tomato or chicken salad with fruit and chips -If your sugar is under 70 eat hard candy or drink juice-Always check your sugar first -If your sugar is above 180 jog in place, go for a walk, or drink a glass of water   Expected Outcomes:  Demonstrated interest in learning. Expect positive outcomes  Education material provided: Living Well with Diabetes, Meal plan card, Snack sheet and Support group flyer  If problems or questions, patient to contact team via:  Phone  Future DSME  appointment: PRN

## 2016-07-25 NOTE — Patient Instructions (Addendum)
-  Speak to your physician about your glimepiride causing sugar lows in the morning  -Take your glucometer everywhere you go -Use a different finger every time you check your sugar -Proper needle disposal:  Do NOT throw them in the toilet  Only use hard plastic containers for example: your laundry deter gant bottle  When your bottle is full make sure the top is securely fastened and tape it down and then write DO NOT RECYCLE on the outside of the bottle -Do NOT use the same needle to prick more than once -Meal ideas: half a banana, one slice of bread spread with peanut butter   Open faced sandwich tomato or chicken salad with fruit and chips -If your sugar is under 70 eat hard candy or drink juice-Always check your sugar first -If your sugar is above 180 jog in place, go for a walk, or drink a glass of water

## 2016-08-15 ENCOUNTER — Telehealth: Payer: Self-pay | Admitting: Family Medicine

## 2016-08-15 NOTE — Telephone Encounter (Signed)
Pt dropped off letter to give to PCP regarding meds and wanted PCP to see the letter. Document at front office tray.

## 2016-08-17 ENCOUNTER — Other Ambulatory Visit: Payer: Self-pay | Admitting: Family Medicine

## 2016-08-17 MED ORDER — ACCU-CHEK AVIVA PLUS VI STRP: ORAL_STRIP | 3 refills | Status: DC

## 2016-08-17 NOTE — Telephone Encounter (Signed)
Sent in strips to [facility].

## 2016-08-28 DIAGNOSIS — D485 Neoplasm of uncertain behavior of skin: Secondary | ICD-10-CM | POA: Diagnosis not present

## 2016-08-28 DIAGNOSIS — L821 Other seborrheic keratosis: Secondary | ICD-10-CM | POA: Diagnosis not present

## 2016-08-28 DIAGNOSIS — L570 Actinic keratosis: Secondary | ICD-10-CM | POA: Diagnosis not present

## 2016-09-11 ENCOUNTER — Ambulatory Visit (INDEPENDENT_AMBULATORY_CARE_PROVIDER_SITE_OTHER): Payer: Medicare Other

## 2016-09-11 DIAGNOSIS — Z23 Encounter for immunization: Secondary | ICD-10-CM | POA: Diagnosis not present

## 2016-09-19 DIAGNOSIS — H2513 Age-related nuclear cataract, bilateral: Secondary | ICD-10-CM | POA: Diagnosis not present

## 2016-09-19 DIAGNOSIS — E119 Type 2 diabetes mellitus without complications: Secondary | ICD-10-CM | POA: Diagnosis not present

## 2016-09-19 LAB — HM DIABETES EYE EXAM

## 2016-09-20 DIAGNOSIS — H2513 Age-related nuclear cataract, bilateral: Secondary | ICD-10-CM | POA: Diagnosis not present

## 2016-09-20 DIAGNOSIS — E119 Type 2 diabetes mellitus without complications: Secondary | ICD-10-CM | POA: Diagnosis not present

## 2016-10-05 ENCOUNTER — Encounter: Payer: Medicare Other | Admitting: Family Medicine

## 2016-11-09 ENCOUNTER — Encounter: Payer: Self-pay | Admitting: Family Medicine

## 2016-11-09 ENCOUNTER — Ambulatory Visit (INDEPENDENT_AMBULATORY_CARE_PROVIDER_SITE_OTHER): Payer: Medicare Other | Admitting: Family Medicine

## 2016-11-09 ENCOUNTER — Encounter: Payer: Self-pay | Admitting: *Deleted

## 2016-11-09 VITALS — BP 110/72 | HR 69 | Temp 98.1°F | Ht 64.0 in | Wt 160.1 lb

## 2016-11-09 DIAGNOSIS — E663 Overweight: Secondary | ICD-10-CM

## 2016-11-09 DIAGNOSIS — E782 Mixed hyperlipidemia: Secondary | ICD-10-CM

## 2016-11-09 DIAGNOSIS — E1169 Type 2 diabetes mellitus with other specified complication: Secondary | ICD-10-CM | POA: Diagnosis not present

## 2016-11-09 DIAGNOSIS — E669 Obesity, unspecified: Secondary | ICD-10-CM | POA: Diagnosis not present

## 2016-11-09 DIAGNOSIS — Z1239 Encounter for other screening for malignant neoplasm of breast: Secondary | ICD-10-CM

## 2016-11-09 DIAGNOSIS — Z0000 Encounter for general adult medical examination without abnormal findings: Secondary | ICD-10-CM | POA: Diagnosis not present

## 2016-11-09 DIAGNOSIS — R197 Diarrhea, unspecified: Secondary | ICD-10-CM | POA: Diagnosis not present

## 2016-11-09 DIAGNOSIS — E2839 Other primary ovarian failure: Secondary | ICD-10-CM

## 2016-11-09 DIAGNOSIS — Z1231 Encounter for screening mammogram for malignant neoplasm of breast: Secondary | ICD-10-CM | POA: Diagnosis not present

## 2016-11-09 LAB — CBC
HCT: 40.2 % (ref 35.0–45.0)
Hemoglobin: 13.1 g/dL (ref 11.7–15.5)
MCH: 29.2 pg (ref 27.0–33.0)
MCHC: 32.6 g/dL (ref 32.0–36.0)
MCV: 89.7 fL (ref 80.0–100.0)
MPV: 8.9 fL (ref 7.5–12.5)
Platelets: 295 10*3/uL (ref 140–400)
RBC: 4.48 MIL/uL (ref 3.80–5.10)
RDW: 13.6 % (ref 11.0–15.0)
WBC: 7.7 10*3/uL (ref 3.8–10.8)

## 2016-11-09 LAB — LIPID PANEL
CHOL/HDL RATIO: 3.2 ratio (ref <5.0)
Cholesterol: 160 mg/dL (ref <200)
HDL: 50 mg/dL — ABNORMAL LOW (ref >50)
LDL CALC: 85 mg/dL (ref <100)
Triglycerides: 124 mg/dL (ref <150)
VLDL: 25 mg/dL (ref <30)

## 2016-11-09 LAB — COMPREHENSIVE METABOLIC PANEL
ALT: 17 U/L (ref 6–29)
AST: 22 U/L (ref 10–35)
Albumin: 4.3 g/dL (ref 3.6–5.1)
Alkaline Phosphatase: 50 U/L (ref 33–130)
BILIRUBIN TOTAL: 0.4 mg/dL (ref 0.2–1.2)
BUN: 13 mg/dL (ref 7–25)
CO2: 25 mmol/L (ref 20–31)
CREATININE: 0.62 mg/dL (ref 0.60–0.93)
Calcium: 9.8 mg/dL (ref 8.6–10.4)
Chloride: 105 mmol/L (ref 98–110)
GLUCOSE: 70 mg/dL (ref 65–99)
Potassium: 4 mmol/L (ref 3.5–5.3)
SODIUM: 139 mmol/L (ref 135–146)
Total Protein: 6.7 g/dL (ref 6.1–8.1)

## 2016-11-09 LAB — HEMOGLOBIN A1C
HEMOGLOBIN A1C: 7.2 % — AB (ref <5.7)
MEAN PLASMA GLUCOSE: 160 mg/dL

## 2016-11-09 LAB — TSH: TSH: 1.63 mIU/L

## 2016-11-09 NOTE — Assessment & Plan Note (Signed)
Patient encouraged to maintain heart healthy diet, regular exercise, adequate sleep. Consider daily probiotics. Take medications as prescribed 

## 2016-11-09 NOTE — Progress Notes (Signed)
Pre visit review using our clinic review tool, if applicable. No additional management support is needed unless otherwise documented below in the visit note. 

## 2016-11-09 NOTE — Patient Instructions (Addendum)
Soak feet in distilled white vinegar and hot water for 15 minutes nightly then apply Lamisil cream then in am apply vick's vapor rub  Preventive Care 75 Years and Older, Female Preventive care refers to lifestyle choices and visits with your health care provider that can promote health and wellness. What does preventive care include?  A yearly physical exam. This is also called an annual well check.  Dental exams once or twice a year.  Routine eye exams. Ask your health care provider how often you should have your eyes checked.  Personal lifestyle choices, including:  Daily care of your teeth and gums.  Regular physical activity.  Eating a healthy diet.  Avoiding tobacco and drug use.  Limiting alcohol use.  Practicing safe sex.  Taking low-dose aspirin every day.  Taking vitamin and mineral supplements as recommended by your health care provider. What happens during an annual well check? The services and screenings done by your health care provider during your annual well check will depend on your age, overall health, lifestyle risk factors, and family history of disease. Counseling  Your health care provider may ask you questions about your:  Alcohol use.  Tobacco use.  Drug use.  Emotional well-being.  Home and relationship well-being.  Sexual activity.  Eating habits.  History of falls.  Memory and ability to understand (cognition).  Work and work Statistician.  Reproductive health. Screening  You may have the following tests or measurements:  Height, weight, and BMI.  Blood pressure.  Lipid and cholesterol levels. These may be checked every 5 years, or more frequently if you are over 55 years old.  Skin check.  Lung cancer screening. You may have this screening every year starting at age 29 if you have a 30-pack-year history of smoking and currently smoke or have quit within the past 15 years.  Fecal occult blood test (FOBT) of the stool. You  may have this test every year starting at age 40.  Flexible sigmoidoscopy or colonoscopy. You may have a sigmoidoscopy every 5 years or a colonoscopy every 10 years starting at age 3.  Hepatitis C blood test.  Hepatitis B blood test.  Sexually transmitted disease (STD) testing.  Diabetes screening. This is done by checking your blood sugar (glucose) after you have not eaten for a while (fasting). You may have this done every 1-3 years.  Bone density scan. This is done to screen for osteoporosis. You may have this done starting at age 82.  Mammogram. This may be done every 1-2 years. Talk to your health care provider about how often you should have regular mammograms. Talk with your health care provider about your test results, treatment options, and if necessary, the need for more tests. Vaccines  Your health care provider may recommend certain vaccines, such as:  Influenza vaccine. This is recommended every year.  Tetanus, diphtheria, and acellular pertussis (Tdap, Td) vaccine. You may need a Td booster every 10 years.  Varicella vaccine. You may need this if you have not been vaccinated.  Zoster vaccine. You may need this after age 37.  Measles, mumps, and rubella (MMR) vaccine. You may need at least one dose of MMR if you were born in 1957 or later. You may also need a second dose.  Pneumococcal 13-valent conjugate (PCV13) vaccine. One dose is recommended after age 69.  Pneumococcal polysaccharide (PPSV23) vaccine. One dose is recommended after age 72.  Meningococcal vaccine. You may need this if you have certain conditions.  Hepatitis  A vaccine. You may need this if you have certain conditions or if you travel or work in places where you may be exposed to hepatitis A.  Hepatitis B vaccine. You may need this if you have certain conditions or if you travel or work in places where you may be exposed to hepatitis B.  Haemophilus influenzae type b (Hib) vaccine. You may need  this if you have certain conditions. Talk to your health care provider about which screenings and vaccines you need and how often you need them. This information is not intended to replace advice given to you by your health care provider. Make sure you discuss any questions you have with your health care provider. Document Released: 01/06/2016 Document Revised: 08/29/2016 Document Reviewed: 10/11/2015 Elsevier Interactive Patient Education  2017 [employer].

## 2016-11-09 NOTE — Progress Notes (Addendum)
Subjective:    Patient ID: Gail Lester, female    DOB: 10-Nov-1941, 75 y.o.   MRN: [id number]  Chief Complaint  Patient presents with  . Annual Exam  . Medicare Wellness    HPI Patient is in today for annual preventative exam and follow up on numerous medical conditions including diabetes 2 and hyperlipdiemia. Is tolerating Crestor and is interested in switching to the generic version due to cost. Her diarrhea is improved lately with dietary changes and med changes. No recent hospitalizations or acute illness. She is doing well at hime with ADLs. Reports am blood sugars are 70 to 100. No polyuria or polydipsia. Denies CP/palp/SOB/HA/congestion/fevers/GI or GU c/o. Taking meds as prescribed  Past Medical History:  Diagnosis Date  . Arthritis   . Chicken pox as a child  . Cough 03/24/2015  . Diabetes ([place])   . Diabetes mellitus   . Diabetes mellitus type 2 in obese [facility]) 02/10/2013   Saw Dr Delman Cheadle, eye doctor in 01/01/13, exam in chart   . Diarrhea 02/10/2013  . Dupuytren's contracture of both hands 02/10/2013  . Encounter for Medicare annual wellness exam 08/04/2013  . Fibrocystic breast 02/10/2013   Left h/o FN biopsy   . Ganglion cyst 11/14/2014   Right 2nd finger  . Hypercholesteremia   . Keratosis, seborrheic 08/01/2015  . Left arm pain 11/14/2014  . Measles as a child  . Medicare annual wellness visit, subsequent 11/06/2015  . 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 Mumps as a child  . Other and unspecified hyperlipidemia 02/10/2013  . Other malaise and fatigue 08/04/2013  . Overweight 06/14/2014  . Preventative health care 11/09/2016    Past Surgical History:  Procedure Laterality Date  . APPENDECTOMY  1955  . BREAST BIOPSY  1985-1990   benign  . ORIF WRIST FRACTURE  12/09/2011   Procedure: OPEN REDUCTION INTERNAL FIXATION (ORIF) WRIST FRACTURE;  Surgeon: Linna Hoff;  Location: [facility];  Service: Orthopedics;  Laterality: Left;    Family History  Problem Relation  Age of Onset  . Kidney disease Mother   . Diabetes Father   . Kidney disease Maternal Grandmother   . Cancer Maternal Grandmother     bladder  . Cancer Maternal Aunt   . Alzheimer's disease Maternal Aunt   . Scoliosis Son   . Colon cancer      grandparent    Social History   Social History  . Marital status: Divorced    Spouse name: N/A  . Number of children: N/A  . Years of education: N/A   Occupational History  . Not on file.   Social History Main Topics  . Smoking status: Former Smoker    Quit date: 11/17/1989  . Smokeless tobacco: Former Systems developer  . Alcohol use No  . Drug use: No  . Sexual activity: No   Other Topics Concern  . Not on file   Social History Narrative  . No narrative on file    Outpatient Medications Prior to Visit  Medication Sig Dispense Refill  . ACCU-CHEK AVIVA PLUS test strip Check Blood Sugar QID and prn labile blood sugars 450 each 3  . ACCU-CHEK SOFTCLIX LANCETS lancets Check blood sugar twice daily.  DX E11.9 200 each 6  . aspirin 81 MG tablet Take 81 mg by mouth daily.    Marland Kitchen b complex vitamins tablet Take 1 tablet by mouth as directed. Two times a week    . Blood Glucose Monitoring Suppl (  ACCU-CHEK AVIVA PLUS) W/DEVICE KIT Use as directed 1 kit 0  . CALCIUM PO Take 400 mg by mouth daily.    . Cholecalciferol (D-3-5) 5000 UNITS capsule Take 4,000 Units by mouth daily.     . Coenzyme Q10 (COQ10) 100 MG CAPS Take 1 capsule by mouth daily.    . Lutein 20 MG CAPS Take by mouth.    Marland Kitchen MAGNESIUM PO Take 250 mg by mouth daily.     . Multiple Vitamin (MULTIVITAMIN) tablet Take 1 tablet by mouth as directed. Two times a week    . Probiotic Product (PROBIOTIC DAILY PO) Take 1 capsule by mouth daily.    . Turmeric 500 MG CAPS Take by mouth daily.    . vitamin C (ASCORBIC ACID) 500 MG tablet Take 500 mg by mouth 2 (two) times daily.    Marland Kitchen glimepiride (AMARYL) 4 MG tablet Take 1 tablet by mouth two  times daily 180 tablet 1  . losartan (COZAAR) 25 MG  tablet Take 1 tablet (25 mg total) by mouth daily. 90 tablet 1  . metFORMIN (GLUCOPHAGE-XR) 500 MG 24 hr tablet Take 1 tablet (500 mg total) by mouth 3 (three) times daily as needed. 270 tablet 1  . rosuvastatin (CRESTOR) 20 MG tablet Take one-half tablet by  mouth daily 45 tablet 1   No facility-administered medications prior to visit.     Allergies  Allergen Reactions  . Ramipril Diarrhea  . Lipitor [Atorvastatin]     Diarrhea, muscle cramps  . Lisinopril     myalgias    Review of Systems  Constitutional: Negative for chills, fever and malaise/fatigue.  HENT: Negative for congestion and hearing loss.   Eyes: Negative for discharge.  Respiratory: Negative for cough, sputum production and shortness of breath.   Cardiovascular: Negative for chest pain, palpitations and leg swelling.  Gastrointestinal: Negative for abdominal pain, blood in stool, constipation, diarrhea, heartburn, nausea and vomiting.  Genitourinary: Negative for dysuria, frequency, hematuria and urgency.  Musculoskeletal: Negative for back pain, falls and myalgias.  Skin: Negative for rash.  Neurological: Negative for dizziness, sensory change, loss of consciousness, weakness and headaches.  Endo/Heme/Allergies: Negative for environmental allergies. Does not bruise/bleed easily.  Psychiatric/Behavioral: Negative for depression and suicidal ideas. The patient is not nervous/anxious and does not have insomnia.        Objective:    Physical Exam  Constitutional: She is oriented to person, place, and time. She appears well-developed and well-nourished. No distress.  HENT:  Head: Normocephalic and atraumatic.  Eyes: Conjunctivae are normal.  Neck: Neck supple. No thyromegaly present.  Cardiovascular: Normal rate, regular rhythm and normal heart sounds.   No murmur heard. Pulmonary/Chest: Effort normal and breath sounds normal. No respiratory distress.  Abdominal: Soft. Bowel sounds are normal. She exhibits no  distension and no mass. There is no tenderness.  Musculoskeletal: She exhibits no edema.  Lymphadenopathy:    She has no cervical adenopathy.  Neurological: She is alert and oriented to person, place, and time.  Skin: Skin is warm and dry.  Psychiatric: She has a normal mood and affect. Her behavior is normal.    BP 110/72 (BP Location: Right Arm, Patient Position: Sitting, Cuff Size: Normal)   Pulse 69   Temp 98.1 F (36.7 C) (Oral)   Ht 5' 4" (1.626 m)   Wt 160 lb 2 oz (72.6 kg)   SpO2 96%   BMI 27.49 kg/m  Wt Readings from Last 3 Encounters:  11/09/16 160 lb 2  oz (72.6 kg)  07/25/16 160 lb (72.6 kg)  03/28/16 156 lb 3.2 oz (70.9 kg)     Lab Results  Component Value Date   WBC 7.7 11/09/2016   HGB 13.1 11/09/2016   HCT 40.2 11/09/2016   PLT 295 11/09/2016   GLUCOSE 70 11/09/2016   CHOL 160 11/09/2016   TRIG 124 11/09/2016   HDL 50 (L) 11/09/2016   LDLDIRECT 109.7 11/24/2012   LDLCALC 85 11/09/2016   ALT 17 11/09/2016   AST 22 11/09/2016   NA 139 11/09/2016   K 4.0 11/09/2016   CL 105 11/09/2016   CREATININE 0.62 11/09/2016   BUN 13 11/09/2016   CO2 25 11/09/2016   TSH 1.63 11/09/2016   HGBA1C 7.2 (H) 11/09/2016   MICROALBUR 1.6 11/03/2015    Lab Results  Component Value Date   TSH 1.63 11/09/2016   Lab Results  Component Value Date   WBC 7.7 11/09/2016   HGB 13.1 11/09/2016   HCT 40.2 11/09/2016   MCV 89.7 11/09/2016   PLT 295 11/09/2016   Lab Results  Component Value Date   NA 139 11/09/2016   K 4.0 11/09/2016   CO2 25 11/09/2016   GLUCOSE 70 11/09/2016   BUN 13 11/09/2016   CREATININE 0.62 11/09/2016   BILITOT 0.4 11/09/2016   ALKPHOS 50 11/09/2016   AST 22 11/09/2016   ALT 17 11/09/2016   PROT 6.7 11/09/2016   ALBUMIN 4.3 11/09/2016   CALCIUM 9.8 11/09/2016   GFR 109.90 07/02/2016   Lab Results  Component Value Date   CHOL 160 11/09/2016   Lab Results  Component Value Date   HDL 50 (L) 11/09/2016   Lab Results  Component  Value Date   LDLCALC 85 11/09/2016   Lab Results  Component Value Date   TRIG 124 11/09/2016   Lab Results  Component Value Date   CHOLHDL 3.2 11/09/2016   Lab Results  Component Value Date   HGBA1C 7.2 (H) 11/09/2016       Assessment & Plan:   Problem List Items Addressed This Visit    Diabetes mellitus type 2 in obese ([place]) - Primary    hgba1c acceptable, minimize simple carbs. Increase exercise as tolerated. Continue current meds      Relevant Orders   Hemoglobin A1c (Completed)   Lipid panel (Completed)   Hyperlipidemia, mixed    Tolerating statin, encouraged heart healthy diet, avoid trans fats, minimize simple carbs and saturated fats. Increase exercise as tolerated      Diarrhea    Improved with recent dietary changes      Encounter for Medicare annual wellness exam   Overweight    Encouraged DASH diet, decrease po intake and increase exercise as tolerated. Needs 7-8 hours of sleep nightly. Avoid trans fats, eat small, frequent meals every 4-5 hours with lean proteins, complex carbs and healthy fats. Minimize simple carbs, GMO foods. She is trying the [employer] at the  moment      Preventative health care    Patient encouraged to maintain heart healthy diet, regular exercise, adequate sleep. Consider daily probiotics. Take medications as prescribed      Relevant Orders   CBC (Completed)   Comprehensive metabolic panel (Completed)   TSH (Completed)    Other Visit Diagnoses    Screening for malignant neoplasm of breast       Relevant Orders   MM SCREENING BREAST TOMO BILATERAL (Completed)   Breast cancer screening  Estrogen deficiency       Relevant Orders   DG Bone Density (Completed)      I am having Ms. Martin maintain her aspirin, Cholecalciferol, Lutein, b complex vitamins, multivitamin, MAGNESIUM PO, CALCIUM PO, vitamin C, Probiotic Product (PROBIOTIC DAILY PO), CoQ10, Turmeric, ACCU-CHEK AVIVA PLUS, ACCU-CHEK SOFTCLIX LANCETS, ACCU-CHEK  AVIVA PLUS, and Krill Oil.  Meds ordered this encounter  Medications  . Krill Oil 500 MG CAPS    Sig: Take 1 capsule by mouth daily.     Penni Homans, MD

## 2016-11-09 NOTE — Assessment & Plan Note (Addendum)
Improved with recent dietary changes

## 2016-11-09 NOTE — Progress Notes (Signed)
Subjective:   Gail Lester is a 75 y.o. female who presents for Medicare Annual (Subsequent) preventive examination.  Review of Systems:  No ros.    Sleep patterns:  Pt sleeps approximately 8 hr/night. Feels rested when she wakes.  Home Safety/Smoke Alarms:  Pt lives alone with dog. Smoke detectors in place. Wears seatbelt.  Living environment; residence and Firearm Safety: No firearms.  Seat Belt Safety/Bike Helmet: Wears seatbelt.   Counseling:   Eye Exam- Wears trifocals. Follows with Dr.Gould.  Female:   Pap- N/A due to age.      Mammo- Last on 08/03/15. BI-Rad Category 1:Negative. Pt will schedule next mammogram prior to leaving today. Dexa scan-  Last on 06/20/12 Osteopenia. Pt will schedule next Dexa Scan prior to leaving today. CCS- Last on 07/18/10. Polyps removed were found to be benign. Dr.Kaplan recommended 5 year f/u.     Objective:     Vitals: BP 110/72 (BP Location: Right Arm, Patient Position: Sitting, Cuff Size: Normal)   Pulse 69   Temp 98.1 F (36.7 C) (Oral)   Ht '5\' 4"'  (1.626 m)   Wt 160 lb 2 oz (72.6 kg)   SpO2 96%   BMI 27.49 kg/m   Body mass index is 27.49 kg/m.   Tobacco History  Smoking Status  . Former Smoker  . Quit date: 11/17/1989  Smokeless Tobacco  . Former Engineer, structural given: Yes   Past Medical History:  Diagnosis Date  . Arthritis   . Chicken pox as a child  . Cough 03/24/2015  . Diabetes ([place])   . Diabetes mellitus   . Diabetes mellitus type 2 in obese [facility]) 02/10/2013   Saw Dr Delman Cheadle, eye doctor in 01/01/13, exam in chart   . Diarrhea 02/10/2013  . Dupuytren's contracture of both hands 02/10/2013  . Encounter for Medicare annual wellness exam 08/04/2013  . Fibrocystic breast 02/10/2013   Left h/o FN biopsy   . Ganglion cyst 11/14/2014   Right 2nd finger  . Hypercholesteremia   . Keratosis, seborrheic 08/01/2015  . Left arm pain 11/14/2014  . Measles as a child  . Medicare annual wellness visit, subsequent 11/06/2015    . 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 Mumps as a child  . Other and unspecified hyperlipidemia 02/10/2013  . Other malaise and fatigue 08/04/2013  . Overweight 06/14/2014  . Preventative health care 11/09/2016   Past Surgical History:  Procedure Laterality Date  . APPENDECTOMY  1955  . BREAST BIOPSY  1985-1990   benign  . ORIF WRIST FRACTURE  12/09/2011   Procedure: OPEN REDUCTION INTERNAL FIXATION (ORIF) WRIST FRACTURE;  Surgeon: Linna Hoff;  Location: [facility];  Service: Orthopedics;  Laterality: Left;   Family History  Problem Relation Age of Onset  . Kidney disease Mother   . Diabetes Father   . Kidney disease Maternal Grandmother   . Cancer Maternal Grandmother     bladder  . Cancer Maternal Aunt   . Alzheimer's disease Maternal Aunt   . Scoliosis Son   . Colon cancer      grandparent   History  Sexual Activity  . Sexual activity: No    Outpatient Encounter Prescriptions as of 11/09/2016  Medication Sig  . ACCU-CHEK AVIVA PLUS test strip Check Blood Sugar QID and prn labile blood sugars  . ACCU-CHEK SOFTCLIX LANCETS lancets Check blood sugar twice daily.  DX E11.9  . aspirin 81 MG tablet Take 81 mg by mouth  daily.  . b complex vitamins tablet Take 1 tablet by mouth as directed. Two times a week  . Blood Glucose Monitoring Suppl (ACCU-CHEK AVIVA PLUS) W/DEVICE KIT Use as directed  . CALCIUM PO Take 400 mg by mouth daily.  . Cholecalciferol (D-3-5) 5000 UNITS capsule Take 4,000 Units by mouth daily.   . Coenzyme Q10 (COQ10) 100 MG CAPS Take 1 capsule by mouth daily.  Marland Kitchen glimepiride (AMARYL) 4 MG tablet Take 1 tablet by mouth two  times daily  . Krill Oil 500 MG CAPS Take 1 capsule by mouth daily.  Marland Kitchen losartan (COZAAR) 25 MG tablet Take 1 tablet (25 mg total) by mouth daily.  . Lutein 20 MG CAPS Take by mouth.  Marland Kitchen MAGNESIUM PO Take 250 mg by mouth daily.   . metFORMIN (GLUCOPHAGE-XR) 500 MG 24 hr tablet Take 1 tablet (500 mg total) by mouth 3 (three) times daily  as needed.  . Multiple Vitamin (MULTIVITAMIN) tablet Take 1 tablet by mouth as directed. Two times a week  . Probiotic Product (PROBIOTIC DAILY PO) Take 1 capsule by mouth daily.  . rosuvastatin (CRESTOR) 20 MG tablet Take one-half tablet by  mouth daily  . Turmeric 500 MG CAPS Take by mouth daily.  . vitamin C (ASCORBIC ACID) 500 MG tablet Take 500 mg by mouth 2 (two) times daily.   No facility-administered encounter medications on file as of 11/09/2016.     Activities of Daily Living In your present state of health, do you have any difficulty performing the following activities: 11/09/2016 03/28/2016  Hearing? N N  Vision? N N  Difficulty concentrating or making decisions? N Y  Walking or climbing stairs? N N  Dressing or bathing? N N  Doing errands, shopping? N N  Preparing Food and eating ? N -  Using the Toilet? N -  In the past six months, have you accidently leaked urine? N -  Do you have problems with loss of bowel control? N -  Managing your Medications? N -  Managing your Finances? N -  Housekeeping or managing your Housekeeping? N -  Some recent data might be hidden    Patient Care Team: Mosie Lukes, MD as PCP - General (Family Medicine) Sharyne Peach, MD as Consulting Physician (Ophthalmology) Amy Martinique, MD as Consulting Physician (Dermatology)    Assessment:    Physical assessment deferred to PCP.  Exercise Activities and Dietary recommendations Current Exercise Habits: Home exercise routine, Type of exercise: calisthenics, Time (Minutes): 25, Frequency (Times/Week): 6, Weekly Exercise (Minutes/Week): 150, Intensity: Moderate   Diet (meal preparation, eat out, water intake, caffeinated beverages, dairy products, fruits and vegetables):  24 Hour Recall: Breakfast: toast, boiled egg, coffee Lunch: Cottage cheese, apple, coffee Dinner: 3oz meat, apple, coffee Pt states she drinks approximately 12 cups of coffee and 2 cups of water.    Goals    . Continue  daily exercise routine.    . Increase water intake      Fall Risk Fall Risk  11/09/2016 03/28/2016 11/08/2014  Falls in the past year? Yes No No  Number falls in past yr: 2 or more - -  Injury with Fall? No - -   Depression Screen PHQ 2/9 Scores 11/09/2016 03/28/2016 11/08/2014  PHQ - 2 Score 0 0 0     Cognitive Function MMSE - Mini Mental State Exam 11/09/2016  Orientation to time 5  Orientation to Place 5  Registration 3  Attention/ Calculation 5  Recall 3  Language-  name 2 objects 2  Language- repeat 1  Language- follow 3 step command 3  Language- read & follow direction 1  Write a sentence 1  Copy design 1  Total score 30        Immunization History  Administered Date(s) Administered  . Influenza, High Dose Seasonal PF 09/11/2016  . Influenza,inj,Quad PF,36+ Mos 09/02/2013, 08/31/2014, 09/21/2015  . Pneumococcal Conjugate-13 02/04/2014  . Pneumococcal Polysaccharide-23 01/12/2007  . Tdap 01/13/2008  . Zoster 01/12/2007   Screening Tests Health Maintenance  Topic Date Due  . OPHTHALMOLOGY EXAM  08/10/2016  . HEMOGLOBIN A1C  01/02/2017  . FOOT EXAM  01/29/2017  . TETANUS/TDAP  01/12/2018  . COLONOSCOPY  07/18/2020  . INFLUENZA VACCINE  Completed  . DEXA SCAN  Completed  . ZOSTAVAX  Completed  . PNA vac Low Risk Adult  Completed      Plan:    Schedule mammogram and Dexa Scan prior to leaving today.  Increase water intake.  Continue exercise routine. Contacted Dr.Gould's office to get paper work for diabetic eye exam. Pt reported done in Sept.  Sent MyChart message to remind patient to schedule colonoscopy  During the course of the visit the patient was educated and counseled about the following appropriate screening and preventive services:   Vaccines to include Pneumoccal, Influenza, Hepatitis B, Td, Zostavax, HCV  Cardiovascular Disease  Colorectal cancer screening  Bone density screening  Diabetes screening  Glaucoma  screening  Mammography/PAP  Nutrition counseling   Patient Instructions (the written plan) was given to the patient.   Shela Nevin, [place]  11/09/2016  RN AWV note reviewed. Agree with documention and plan.  Penni Homans, MD

## 2016-11-12 ENCOUNTER — Encounter: Payer: Self-pay | Admitting: Family Medicine

## 2016-11-13 ENCOUNTER — Ambulatory Visit (HOSPITAL_BASED_OUTPATIENT_CLINIC_OR_DEPARTMENT_OTHER)
Admission: RE | Admit: 2016-11-13 | Discharge: 2016-11-13 | Disposition: A | Discharge status: Home / Self Care | Payer: Medicare Other | Source: Ambulatory Visit | Attending: Family Medicine | Admitting: Family Medicine

## 2016-11-13 ENCOUNTER — Telehealth: Payer: Self-pay | Admitting: Family Medicine

## 2016-11-13 DIAGNOSIS — E2839 Other primary ovarian failure: Secondary | ICD-10-CM | POA: Diagnosis present

## 2016-11-13 DIAGNOSIS — M85852 Other specified disorders of bone density and structure, left thigh: Secondary | ICD-10-CM | POA: Diagnosis not present

## 2016-11-13 DIAGNOSIS — M8588 Other specified disorders of bone density and structure, other site: Secondary | ICD-10-CM | POA: Insufficient documentation

## 2016-11-13 DIAGNOSIS — Z1231 Encounter for screening mammogram for malignant neoplasm of breast: Secondary | ICD-10-CM | POA: Diagnosis not present

## 2016-11-13 DIAGNOSIS — Z1239 Encounter for other screening for malignant neoplasm of breast: Secondary | ICD-10-CM

## 2016-11-13 NOTE — Telephone Encounter (Signed)
Pt dropped off a white envelope with her info that her PCP needed (document put at front office tray)

## 2016-11-23 ENCOUNTER — Other Ambulatory Visit: Payer: Self-pay | Admitting: Family Medicine

## 2016-11-23 DIAGNOSIS — E669 Obesity, unspecified: Principal | ICD-10-CM

## 2016-11-23 DIAGNOSIS — E1169 Type 2 diabetes mellitus with other specified complication: Secondary | ICD-10-CM

## 2016-11-25 NOTE — Assessment & Plan Note (Signed)
Tolerating statin, encouraged heart healthy diet, avoid trans fats, minimize simple carbs and saturated fats. Increase exercise as tolerated 

## 2016-11-25 NOTE — Assessment & Plan Note (Signed)
Encouraged DASH diet, decrease po intake and increase exercise as tolerated. Needs 7-8 hours of sleep nightly. Avoid trans fats, eat small, frequent meals every 4-5 hours with lean proteins, complex carbs and healthy fats. Minimize simple carbs, GMO foods. She is trying the [employer] at the  moment

## 2016-11-25 NOTE — Assessment & Plan Note (Signed)
hgba1c acceptable, minimize simple carbs. Increase exercise as tolerated. Continue current meds 

## 2016-12-07 ENCOUNTER — Encounter: Payer: Self-pay | Admitting: Family Medicine

## 2017-01-08 ENCOUNTER — Encounter: Payer: Medicare Other | Admitting: Family Medicine

## 2017-01-21 DIAGNOSIS — L720 Epidermal cyst: Secondary | ICD-10-CM

## 2017-01-21 DIAGNOSIS — L718 Other rosacea: Secondary | ICD-10-CM | POA: Diagnosis not present

## 2017-01-21 DIAGNOSIS — D1801 Hemangioma of skin and subcutaneous tissue: Secondary | ICD-10-CM | POA: Diagnosis not present

## 2017-01-21 DIAGNOSIS — L821 Other seborrheic keratosis: Secondary | ICD-10-CM | POA: Diagnosis not present

## 2017-01-21 DIAGNOSIS — D2271 Melanocytic nevi of right lower limb, including hip: Secondary | ICD-10-CM | POA: Diagnosis not present

## 2017-01-21 HISTORY — DX: Epidermal cyst: L72.0

## 2017-02-15 ENCOUNTER — Encounter: Payer: Self-pay | Admitting: Family Medicine

## 2017-02-15 ENCOUNTER — Ambulatory Visit (INDEPENDENT_AMBULATORY_CARE_PROVIDER_SITE_OTHER): Payer: Medicare Other | Admitting: Family Medicine

## 2017-02-15 DIAGNOSIS — E669 Obesity, unspecified: Secondary | ICD-10-CM | POA: Diagnosis not present

## 2017-02-15 DIAGNOSIS — M25562 Pain in left knee: Secondary | ICD-10-CM

## 2017-02-15 DIAGNOSIS — E1169 Type 2 diabetes mellitus with other specified complication: Secondary | ICD-10-CM

## 2017-02-15 DIAGNOSIS — B351 Tinea unguium: Secondary | ICD-10-CM

## 2017-02-15 DIAGNOSIS — K5900 Constipation, unspecified: Secondary | ICD-10-CM

## 2017-02-15 DIAGNOSIS — M25561 Pain in right knee: Secondary | ICD-10-CM | POA: Insufficient documentation

## 2017-02-15 DIAGNOSIS — E782 Mixed hyperlipidemia: Secondary | ICD-10-CM

## 2017-02-15 DIAGNOSIS — E119 Type 2 diabetes mellitus without complications: Secondary | ICD-10-CM

## 2017-02-15 HISTORY — DX: Pain in right knee: M25.561

## 2017-02-15 HISTORY — DX: Constipation, unspecified: K59.00

## 2017-02-15 LAB — LIPID PANEL
Cholesterol: 155 mg/dL (ref 0–200)
HDL: 46.2 mg/dL (ref >39.00)
LDL Cholesterol: 74 mg/dL (ref 0–99)
NONHDL: 109.06
TRIGLYCERIDES: 174 mg/dL — AB (ref 0.0–149.0)
Total CHOL/HDL Ratio: 3
VLDL: 34.8 mg/dL (ref 0.0–40.0)

## 2017-02-15 LAB — COMPREHENSIVE METABOLIC PANEL
ALBUMIN: 4.2 g/dL (ref 3.5–5.2)
ALK PHOS: 49 U/L (ref 39–117)
ALT: 16 U/L (ref 0–35)
AST: 20 U/L (ref 0–37)
BUN: 11 mg/dL (ref 6–23)
CHLORIDE: 106 meq/L (ref 96–112)
CO2: 26 mEq/L (ref 19–32)
Calcium: 9.5 mg/dL (ref 8.4–10.5)
Creatinine, Ser: 0.64 mg/dL (ref 0.40–1.20)
GFR: 95.99 mL/min (ref >60.00)
GLUCOSE: 68 mg/dL — AB (ref 70–99)
POTASSIUM: 4.3 meq/L (ref 3.5–5.1)
SODIUM: 139 meq/L (ref 135–145)
TOTAL PROTEIN: 7 g/dL (ref 6.0–8.3)
Total Bilirubin: 0.4 mg/dL (ref 0.2–1.2)

## 2017-02-15 LAB — CBC
HEMATOCRIT: 39.5 % (ref 36.0–46.0)
HEMOGLOBIN: 13.3 g/dL (ref 12.0–15.0)
MCHC: 33.7 g/dL (ref 30.0–36.0)
MCV: 89.4 fl (ref 78.0–100.0)
Platelets: 305 10*3/uL (ref 150.0–400.0)
RBC: 4.42 Mil/uL (ref 3.87–5.11)
RDW: 13.1 % (ref 11.5–15.5)
WBC: 6.8 10*3/uL (ref 4.0–10.5)

## 2017-02-15 LAB — HEMOGLOBIN A1C: HEMOGLOBIN A1C: 7.5 % — AB (ref 4.6–6.5)

## 2017-02-15 LAB — TSH: TSH: 2.56 u[IU]/mL (ref 0.35–4.50)

## 2017-02-15 MED ORDER — ROSUVASTATIN CALCIUM 20 MG PO TABS: 10.0000 mg | ORAL_TABLET | Freq: Every day | ORAL | 2 refills | Status: DC

## 2017-02-15 NOTE — Assessment & Plan Note (Signed)
Encouraged increased hydration and fiber in diet. Daily probiotics. If bowels not moving can use MOM 2 tbls po in 4 oz of warm prune juice by mouth every 2-3 days. If no results then repeat in 4 hours with  Dulcolax suppository pr, may repeat again in 4 more hours as needed. Seek care if symptoms worsen. Consider daily Miralax and/or Dulcolax if symptoms persist. Switch from Caltrate to Citracal

## 2017-02-15 NOTE — Assessment & Plan Note (Signed)
Tolerating statin, encouraged heart healthy diet, avoid trans fats, minimize simple carbs and saturated fats. Increase exercise as tolerated 

## 2017-02-15 NOTE — Patient Instructions (Addendum)
You want calcium citrate (such as Citracal) instead of Calcium Carbonate to help constipation  Lidocaine gel Icy Hot, Aspercreme, Salon Pas for the knee or groin twice daily  Constipation, Adult Constipation is when a person:  Poops (has a bowel movement) fewer times in a week than normal.  Has a hard time pooping.  Has poop that is dry, hard, or bigger than normal. Follow these instructions at home: Eating and drinking  Eat foods that have a lot of fiber, such as:  Fresh fruits and vegetables.  Whole grains.  Beans.  Eat less of foods that are high in fat, low in fiber, or overly processed, such as:  [place] fries.  Hamburgers.  Cookies.  Candy.  Soda.  Drink enough fluid to keep your pee (urine) clear or pale yellow. General instructions  Exercise regularly or as told by your doctor.  Go to the restroom when you feel like you need to poop. Do not hold it in.  Take over-the-counter and prescription medicines only as told by your doctor. These include any fiber supplements.  Do pelvic floor retraining exercises, such as:  Doing deep breathing while relaxing your lower belly (abdomen).  Relaxing your pelvic floor while pooping.  Watch your condition for any changes.  Keep all follow-up visits as told by your doctor. This is important. Contact a doctor if:  You have pain that gets worse.  You have a fever.  You have not pooped for 4 days.  You throw up (vomit).  You are not hungry.  You lose weight.  You are bleeding from the anus.  You have thin, pencil-like poop (stool). Get help right away if:  You have a fever, and your symptoms suddenly get worse.  You leak poop or have blood in your poop.  Your belly feels hard or bigger than normal (is bloated).  You have very bad belly pain.  You feel dizzy or you faint. This information is not intended to replace advice given to you by your health care provider. Make sure you discuss any questions  you have with your health care provider. Document Released: 05/28/2008 Document Revised: 06/29/2016 Document Reviewed: 05/30/2016 Elsevier Interactive Patient Education  2017 [employer].

## 2017-02-15 NOTE — Assessment & Plan Note (Signed)
hgba1c acceptable, minimize simple carbs. Increase exercise as tolerated. Continue current meds 

## 2017-02-15 NOTE — Assessment & Plan Note (Signed)
Encouraged moist heat and gentle stretching as tolerated. May try NSAIDs and prescription meds as directed and report if symptoms worsen or seek immediate care. Lidocaine gel prn 

## 2017-02-15 NOTE — Progress Notes (Signed)
Patient ID: Gail Lester, female   DOB: 10/09/1941, 76 y.o.   MRN: [id number]   Subjective:  I acted as a scribe for Dr. . Princess, RMA   Patient ID: Gail Lester, female    DOB: 04/12/1941, 76 y.o.   MRN: [id number]  Chief Complaint  Patient presents with  . Follow-up  . Diabetes  . Hyperlipidemia    Diabetes  She presents for her follow-up diabetic visit. Pertinent negatives for hypoglycemia include no headaches. Pertinent negatives for diabetes include no blurred vision and no chest pain.  Hyperlipidemia  This is a recurrent problem. The problem is controlled. Pertinent negatives include no chest pain or shortness of breath.    Patient is in today for a 3 month follow for diabetes, hyperlipidemia and other medical concerns. She feels well today, no recent illness or hospitalizations. She is noting some thickening and yellow in toenails. No polyuria or polydipsia. Denies CP/palp/SOB/HA/congestion/fevers/GI or GU c/o. Taking meds as prescribed  Past Medical History:  Diagnosis Date  . Arthritis   . Chicken pox as a child  . Constipation 02/15/2017  . Cough 03/24/2015  . Diabetes ([facility])   . Diabetes mellitus   . Diabetes mellitus type 2 in obese ([facility]) 02/10/2013   Saw Dr Gould, eye doctor in 01/01/13, exam in chart   . Diarrhea 02/10/2013  . Dupuytren's contracture of both hands 02/10/2013  . Encounter for Medicare annual wellness exam 08/04/2013  . Fibrocystic breast 02/10/2013   Left h/o FN biopsy   . Ganglion cyst 11/14/2014   Right 2nd finger  . Hypercholesteremia   . Keratosis, seborrheic 08/01/2015  . Knee pain, left 02/15/2017  . Left arm pain 11/14/2014  . Measles as a child  . Medicare annual wellness visit, subsequent 11/06/2015  . 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 Mumps as a child  . Other and unspecified hyperlipidemia 02/10/2013  . Other malaise and fatigue 08/04/2013  . Overweight 06/14/2014  . Preventative health care 11/09/2016    Past Surgical  History:  Procedure Laterality Date  . APPENDECTOMY  1955  . BREAST BIOPSY  1985-1990   benign  . ORIF WRIST FRACTURE  12/09/2011   Procedure: OPEN REDUCTION INTERNAL FIXATION (ORIF) WRIST FRACTURE;  Surgeon: Fred W Ortmann;  Location: [facility];  Service: Orthopedics;  Laterality: Left;    Family History  Problem Relation Age of Onset  . Kidney disease Mother   . Diabetes Father   . Kidney disease Maternal Grandmother   . Cancer Maternal Grandmother     bladder  . Cancer Maternal Aunt   . Alzheimer's disease Maternal Aunt   . Scoliosis Son   . Colon cancer      grandparent    Social History   Social History  . Marital status: Divorced    Spouse name: N/A  . Number of children: N/A  . Years of education: N/A   Occupational History  . Not on file.   Social History Main Topics  . Smoking status: Former Smoker    Quit date: 11/17/1989  . Smokeless tobacco: Former User  . Alcohol use No  . Drug use: No  . Sexual activity: No   Other Topics Concern  . Not on file   Social History Narrative  . No narrative on file    Outpatient Medications Prior to Visit  Medication Sig Dispense Refill  . ACCU-CHEK AVIVA PLUS test strip Check Blood Sugar QID and prn labile blood sugars 450 each 3  .   ACCU-CHEK SOFTCLIX LANCETS lancets Check blood sugar twice daily.  DX E11.9 200 each 6  . aspirin 81 MG tablet Take 81 mg by mouth daily.    Marland Kitchen b complex vitamins tablet Take 1 tablet by mouth as directed. Two times a week    . Blood Glucose Monitoring Suppl (ACCU-CHEK AVIVA PLUS) W/DEVICE KIT Use as directed 1 kit 0  . CALCIUM PO Take 400 mg by mouth daily.    . Cholecalciferol (D-3-5) 5000 UNITS capsule Take 4,000 Units by mouth daily.     . Coenzyme Q10 (COQ10) 100 MG CAPS Take 1 capsule by mouth daily.    Marland Kitchen glimepiride (AMARYL) 4 MG tablet TAKE 1 TABLET BY MOUTH TWO  TIMES DAILY 180 tablet 2  . Krill Oil 500 MG CAPS Take 1 capsule by mouth daily.    Marland Kitchen losartan (COZAAR) 25 MG tablet  TAKE 1 TABLET BY MOUTH  DAILY 90 tablet 2  . Lutein 20 MG CAPS Take by mouth.    Marland Kitchen MAGNESIUM PO Take 250 mg by mouth daily.     . metFORMIN (GLUCOPHAGE-XR) 500 MG 24 hr tablet TAKE 1 TABLET BY MOUTH 3  TIMES DAILY AS NEEDED 270 tablet 2  . Multiple Vitamin (MULTIVITAMIN) tablet Take 1 tablet by mouth as directed. Two times a week    . Probiotic Product (PROBIOTIC DAILY PO) Take 1 capsule by mouth daily.    . Turmeric 500 MG CAPS Take by mouth daily.    . vitamin C (ASCORBIC ACID) 500 MG tablet Take 500 mg by mouth 2 (two) times daily.    . CRESTOR 20 MG tablet TAKE ONE-HALF TABLET BY  MOUTH DAILY 45 tablet 2   No facility-administered medications prior to visit.     Allergies  Allergen Reactions  . Ramipril Diarrhea  . Lipitor [Atorvastatin]     Diarrhea, muscle cramps  . Lisinopril     myalgias    Review of Systems  Constitutional: Negative for fever and malaise/fatigue.  HENT: Negative for congestion.   Eyes: Negative for blurred vision.  Respiratory: Negative for cough and shortness of breath.   Cardiovascular: Negative for chest pain, palpitations and leg swelling.  Gastrointestinal: Negative for vomiting.  Musculoskeletal: Positive for joint pain. Negative for back pain.  Skin: Negative for rash.  Neurological: Negative for loss of consciousness and headaches.  Psychiatric/Behavioral: Positive for depression.       Objective:    Physical Exam  Constitutional: She is oriented to person, place, and time. She appears well-developed and well-nourished. No distress.  HENT:  Head: Normocephalic and atraumatic.  Eyes: Conjunctivae are normal.  Neck: Normal range of motion. No thyromegaly present.  Cardiovascular: Normal rate and regular Gail.   Pulmonary/Chest: Effort normal and breath sounds normal. She has no wheezes.  Abdominal: Soft. Bowel sounds are normal. There is no tenderness.  Musculoskeletal: Normal range of motion. She exhibits no edema or deformity.    Neurological: She is alert and oriented to person, place, and time.  Skin: Skin is warm and dry. She is not diaphoretic.  Psychiatric: She has a normal mood and affect.    BP 128/78 (BP Location: Left Arm, Patient Position: Sitting, Cuff Size: Normal)   Pulse 62   Temp 97.8 F (36.6 C) (Oral)   Resp 18   Wt 163 lb 6.4 oz (74.1 kg)   SpO2 97%   BMI 28.05 kg/m  Wt Readings from Last 3 Encounters:  02/15/17 163 lb 6.4 oz (74.1 kg)  11/09/16 160 lb 2 oz (72.6 kg)  07/25/16 160 lb (72.6 kg)     Lab Results  Component Value Date   WBC 6.8 02/15/2017   HGB 13.3 02/15/2017   HCT 39.5 02/15/2017   PLT 305.0 02/15/2017   GLUCOSE 68 (L) 02/15/2017   CHOL 155 02/15/2017   TRIG 174.0 (H) 02/15/2017   HDL 46.20 02/15/2017   LDLDIRECT 109.7 11/24/2012   LDLCALC 74 02/15/2017   ALT 16 02/15/2017   AST 20 02/15/2017   NA 139 02/15/2017   K 4.3 02/15/2017   CL 106 02/15/2017   CREATININE 0.64 02/15/2017   BUN 11 02/15/2017   CO2 26 02/15/2017   TSH 2.56 02/15/2017   HGBA1C 7.5 (H) 02/15/2017   MICROALBUR 1.6 11/03/2015    Lab Results  Component Value Date   TSH 2.56 02/15/2017   Lab Results  Component Value Date   WBC 6.8 02/15/2017   HGB 13.3 02/15/2017   HCT 39.5 02/15/2017   MCV 89.4 02/15/2017   PLT 305.0 02/15/2017   Lab Results  Component Value Date   NA 139 02/15/2017   K 4.3 02/15/2017   CO2 26 02/15/2017   GLUCOSE 68 (L) 02/15/2017   BUN 11 02/15/2017   CREATININE 0.64 02/15/2017   BILITOT 0.4 02/15/2017   ALKPHOS 49 02/15/2017   AST 20 02/15/2017   ALT 16 02/15/2017   PROT 7.0 02/15/2017   ALBUMIN 4.2 02/15/2017   CALCIUM 9.5 02/15/2017   GFR 95.99 02/15/2017   Lab Results  Component Value Date   CHOL 155 02/15/2017   Lab Results  Component Value Date   HDL 46.20 02/15/2017   Lab Results  Component Value Date   LDLCALC 74 02/15/2017   Lab Results  Component Value Date   TRIG 174.0 (H) 02/15/2017   Lab Results  Component Value Date    CHOLHDL 3 02/15/2017   Lab Results  Component Value Date   HGBA1C 7.5 (H) 02/15/2017       Assessment & Plan:   Problem List Items Addressed This Visit    Diabetes mellitus type 2 in obese ([facility])    hgba1c acceptable, minimize simple carbs. Increase exercise as tolerated. Continue current meds      Relevant Medications   rosuvastatin (CRESTOR) 20 MG tablet   Other Relevant Orders   CBC (Completed)   Hemoglobin A1c (Completed)   Hyperlipidemia, mixed    Tolerating statin, encouraged heart healthy diet, avoid trans fats, minimize simple carbs and saturated fats. Increase exercise as tolerated      Relevant Medications   rosuvastatin (CRESTOR) 20 MG tablet   Other Relevant Orders   Comprehensive metabolic panel (Completed)   Lipid panel (Completed)   TSH (Completed)   Constipation    Encouraged increased hydration and fiber in diet. Daily probiotics. If bowels not moving can use MOM 2 tbls po in 4 oz of warm prune juice by mouth every 2-3 days. If no results then repeat in 4 hours with  Dulcolax suppository pr, may repeat again in 4 more hours as needed. Seek care if symptoms worsen. Consider daily Miralax and/or Dulcolax if symptoms persist. Switch from Caltrate to Citracal      Relevant Orders   CBC (Completed)   Comprehensive metabolic panel (Completed)   Lipid panel (Completed)   TSH (Completed)   Knee pain, left    Encouraged moist heat and gentle stretching as tolerated. May try NSAIDs and prescription meds as directed and report if symptoms worsen   or seek immediate care. Lidocaine gel prn      Onychomycosis    Soak in vinegar and water, apply Lamisil and vick's vapor rub.         I have changed Ms. Koon's CRESTOR to rosuvastatin. I am also having her maintain her aspirin, Cholecalciferol, Lutein, b complex vitamins, multivitamin, MAGNESIUM PO, CALCIUM PO, vitamin C, Probiotic Product (PROBIOTIC DAILY PO), CoQ10, Turmeric, ACCU-CHEK AVIVA PLUS, ACCU-CHEK  SOFTCLIX LANCETS, ACCU-CHEK AVIVA PLUS, Krill Oil, metFORMIN, glimepiride, and losartan.  Meds ordered this encounter  Medications  . rosuvastatin (CRESTOR) 20 MG tablet    Sig: Take 0.5 tablets (10 mg total) by mouth daily.    Dispense:  45 tablet    Refill:  2    CMA served as scribe during this visit. History, Physical and Plan performed by medical provider. Documentation and orders reviewed and attested to.   , MD  

## 2017-02-15 NOTE — Progress Notes (Signed)
Pre visit review using our clinic review tool, if applicable. No additional management support is needed unless otherwise documented below in the visit note. 

## 2017-02-21 ENCOUNTER — Encounter: Payer: Self-pay | Admitting: Family Medicine

## 2017-02-22 ENCOUNTER — Other Ambulatory Visit: Payer: Self-pay | Admitting: Family Medicine

## 2017-02-24 DIAGNOSIS — B351 Tinea unguium: Secondary | ICD-10-CM

## 2017-02-24 HISTORY — DX: Tinea unguium: B35.1

## 2017-02-24 NOTE — Assessment & Plan Note (Signed)
Soak in vinegar and water, apply Lamisil and vick's vapor rub.

## 2017-03-09 ENCOUNTER — Encounter: Payer: Self-pay | Admitting: Family Medicine

## 2017-03-13 ENCOUNTER — Other Ambulatory Visit: Payer: Self-pay

## 2017-03-13 MED ORDER — ROSUVASTATIN CALCIUM 20 MG PO TABS
10.0000 mg | ORAL_TABLET | Freq: Every day | ORAL | 3 refills | Status: DC
Start: 2017-03-13 — End: 2017-12-29

## 2017-03-14 ENCOUNTER — Encounter: Payer: Self-pay | Admitting: Family Medicine

## 2017-05-23 ENCOUNTER — Encounter: Payer: Self-pay | Admitting: Family Medicine

## 2017-05-23 ENCOUNTER — Ambulatory Visit (INDEPENDENT_AMBULATORY_CARE_PROVIDER_SITE_OTHER): Payer: Medicare Other | Admitting: Family Medicine

## 2017-05-23 VITALS — BP 118/64 | HR 70 | Temp 98.1°F | Resp 18 | Wt 158.2 lb

## 2017-05-23 DIAGNOSIS — E1169 Type 2 diabetes mellitus with other specified complication: Secondary | ICD-10-CM | POA: Diagnosis not present

## 2017-05-23 DIAGNOSIS — E663 Overweight: Secondary | ICD-10-CM

## 2017-05-23 DIAGNOSIS — K5900 Constipation, unspecified: Secondary | ICD-10-CM | POA: Diagnosis not present

## 2017-05-23 DIAGNOSIS — M8580 Other specified disorders of bone density and structure, unspecified site: Secondary | ICD-10-CM

## 2017-05-23 DIAGNOSIS — M810 Age-related osteoporosis without current pathological fracture: Secondary | ICD-10-CM | POA: Insufficient documentation

## 2017-05-23 DIAGNOSIS — L989 Disorder of the skin and subcutaneous tissue, unspecified: Secondary | ICD-10-CM

## 2017-05-23 DIAGNOSIS — E669 Obesity, unspecified: Secondary | ICD-10-CM

## 2017-05-23 DIAGNOSIS — E782 Mixed hyperlipidemia: Secondary | ICD-10-CM | POA: Diagnosis not present

## 2017-05-23 DIAGNOSIS — M25562 Pain in left knee: Secondary | ICD-10-CM

## 2017-05-23 DIAGNOSIS — H6191 Disorder of right external ear, unspecified: Secondary | ICD-10-CM

## 2017-05-23 HISTORY — DX: Other specified disorders of bone density and structure, unspecified site: M85.80

## 2017-05-23 HISTORY — DX: Age-related osteoporosis without current pathological fracture: M81.0

## 2017-05-23 LAB — COMPREHENSIVE METABOLIC PANEL
ALBUMIN: 4.2 g/dL (ref 3.5–5.2)
ALK PHOS: 54 U/L (ref 39–117)
ALT: 15 U/L (ref 0–35)
AST: 19 U/L (ref 0–37)
BUN: 15 mg/dL (ref 6–23)
CALCIUM: 9.5 mg/dL (ref 8.4–10.5)
CHLORIDE: 106 meq/L (ref 96–112)
CO2: 25 mEq/L (ref 19–32)
Creatinine, Ser: 0.61 mg/dL (ref 0.40–1.20)
GFR: 101.38 mL/min (ref >60.00)
Glucose, Bld: 61 mg/dL — ABNORMAL LOW (ref 70–99)
POTASSIUM: 3.9 meq/L (ref 3.5–5.1)
Sodium: 139 mEq/L (ref 135–145)
TOTAL PROTEIN: 7.1 g/dL (ref 6.0–8.3)
Total Bilirubin: 0.4 mg/dL (ref 0.2–1.2)

## 2017-05-23 LAB — LIPID PANEL
CHOL/HDL RATIO: 3
CHOLESTEROL: 151 mg/dL (ref 0–200)
HDL: 46.3 mg/dL (ref >39.00)
NonHDL: 104.35
TRIGLYCERIDES: 280 mg/dL — AB (ref 0.0–149.0)
VLDL: 56 mg/dL — AB (ref 0.0–40.0)

## 2017-05-23 LAB — CBC
HCT: 39.6 % (ref 36.0–46.0)
Hemoglobin: 13.2 g/dL (ref 12.0–15.0)
MCHC: 33.3 g/dL (ref 30.0–36.0)
MCV: 89.6 fl (ref 78.0–100.0)
PLATELETS: 307 10*3/uL (ref 150.0–400.0)
RBC: 4.42 Mil/uL (ref 3.87–5.11)
RDW: 13.4 % (ref 11.5–15.5)
WBC: 8.4 10*3/uL (ref 4.0–10.5)

## 2017-05-23 LAB — HEMOGLOBIN A1C: Hgb A1c MFr Bld: 7.1 % — ABNORMAL HIGH (ref 4.6–6.5)

## 2017-05-23 LAB — TSH: TSH: 1.8 u[IU]/mL (ref 0.35–4.50)

## 2017-05-23 LAB — LDL CHOLESTEROL, DIRECT: LDL DIRECT: 74 mg/dL

## 2017-05-23 MED ORDER — METFORMIN HCL ER 500 MG PO TB24: 1000.0000 mg | ORAL_TABLET | ORAL | 2 refills | Status: DC

## 2017-05-23 NOTE — Assessment & Plan Note (Signed)
Encouraged heart healthy diet, increase exercise, avoid trans fats, consider a krill oil cap daily 

## 2017-05-23 NOTE — Patient Instructions (Signed)

## 2017-05-23 NOTE — Assessment & Plan Note (Signed)
Encouraged DASH diet, decrease po intake and increase exercise as tolerated. Needs 7-8 hours of sleep nightly. Avoid trans fats, eat small, frequent meals every 4-5 hours with lean proteins, complex carbs and healthy fats. Minimize simple carbs 

## 2017-05-23 NOTE — Progress Notes (Signed)
Subjective:  I acted as a Education administrator for Dr. Charlett Blake. [place], [place]  Patient ID: Gail Lester, female    DOB: [birth year], 76 y.o.   MRN: [id number]  Chief Complaint  Patient presents with  . Follow-up    HPI  Patient is in today for a 3 month follow up she c/o right knee pain, she denies injury. No recent febrile illness or acute hospitalizations. Denies CP/palp/SOB/HA/congestion/fevers/GI or GU c/o. Taking meds as prescribed. No recent febrile illness or hospitalizations. No polyuria or polydipsia. Denies CP/palp/SOB/HA/congestion/fevers/GI or GU c/o. Taking meds as prescribed   Patient Care Team: Mosie Lukes, MD as PCP - General (Family Medicine) Sharyne Peach, MD as Consulting Physician (Ophthalmology) Martinique, Amy, MD as Consulting Physician (Dermatology)   Past Medical History:  Diagnosis Date  . Arthritis   . Chicken pox as a child  . Constipation 02/15/2017  . Cough 03/24/2015  . Diabetes ([place])   . Diabetes mellitus   . Diabetes mellitus type 2 in obese [facility]) 02/10/2013   Saw Dr Delman Cheadle, eye doctor in 01/01/13, exam in chart   . Diarrhea 02/10/2013  . Dupuytren's contracture of both hands 02/10/2013  . Encounter for Medicare annual wellness exam 08/04/2013  . Fibrocystic breast 02/10/2013   Left h/o FN biopsy   . Ganglion cyst 11/14/2014   Right 2nd finger  . Hypercholesteremia   . Keratosis, seborrheic 08/01/2015  . Knee pain, left 02/15/2017  . Left arm pain 11/14/2014  . Measles as a child  . Medicare annual wellness visit, subsequent 11/06/2015  . 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 Mumps as a child  . Osteopenia 05/23/2017  . Other and unspecified hyperlipidemia 02/10/2013  . Other malaise and fatigue 08/04/2013  . Overweight 06/14/2014  . Preventative health care 11/09/2016    Past Surgical History:  Procedure Laterality Date  . APPENDECTOMY  1955  . BREAST BIOPSY  1985-1990   benign  . ORIF WRIST FRACTURE  12/09/2011   Procedure: OPEN REDUCTION INTERNAL  FIXATION (ORIF) WRIST FRACTURE;  Surgeon: Linna Hoff;  Location: [facility];  Service: Orthopedics;  Laterality: Left;    Family History  Problem Relation Age of Onset  . Kidney disease Mother   . Diabetes Father   . Kidney disease Maternal Grandmother   . Cancer Maternal Grandmother        bladder  . Cancer Maternal Aunt   . Alzheimer's disease Maternal Aunt   . Scoliosis Son   . Colon cancer Unknown        grandparent    Social History   Social History  . Marital status: Divorced    Spouse name: N/A  . Number of children: N/A  . Years of education: N/A   Occupational History  . Not on file.   Social History Main Topics  . Smoking status: Former Smoker    Quit date: 11/17/1989  . Smokeless tobacco: Former Systems developer  . Alcohol use No  . Drug use: No  . Sexual activity: No   Other Topics Concern  . Not on file   Social History Narrative  . No narrative on file    Outpatient Medications Prior to Visit  Medication Sig Dispense Refill  . ACCU-CHEK AVIVA PLUS test strip Check Blood Sugar QID and prn labile blood sugars 450 each 3  . aspirin 81 MG tablet Take 81 mg by mouth daily.    Marland Kitchen b complex vitamins tablet Take 1 tablet by mouth as directed. Two times  a week    . Blood Glucose Monitoring Suppl (ACCU-CHEK AVIVA PLUS) W/DEVICE KIT Use as directed 1 kit 0  . CALCIUM PO Take 400 mg by mouth daily.    . Cholecalciferol (D-3-5) 5000 UNITS capsule Take 4,000 Units by mouth daily.     . Coenzyme Q10 (COQ10) 100 MG CAPS Take 1 capsule by mouth daily.    Marland Kitchen glimepiride (AMARYL) 4 MG tablet TAKE 1 TABLET BY MOUTH TWO  TIMES DAILY 180 tablet 2  . Krill Oil 500 MG CAPS Take 1 capsule by mouth daily.    Marland Kitchen losartan (COZAAR) 25 MG tablet TAKE 1 TABLET BY MOUTH  DAILY 90 tablet 2  . Lutein 20 MG CAPS Take by mouth.    Marland Kitchen MAGNESIUM PO Take 250 mg by mouth daily.     . Multiple Vitamin (MULTIVITAMIN) tablet Take 1 tablet by mouth as directed. Two times a week    . Probiotic Product  (PROBIOTIC DAILY PO) Take 1 capsule by mouth daily.    . rosuvastatin (CRESTOR) 20 MG tablet Take 0.5 tablets (10 mg total) by mouth daily. 45 tablet 3  . Turmeric 500 MG CAPS Take by mouth daily.    . vitamin C (ASCORBIC ACID) 500 MG tablet Take 500 mg by mouth 2 (two) times daily.    . metFORMIN (GLUCOPHAGE-XR) 500 MG 24 hr tablet TAKE 1 TABLET BY MOUTH 3  TIMES DAILY AS NEEDED 270 tablet 2  . ACCU-CHEK SOFTCLIX LANCETS lancets Check blood sugar twice daily.  DX E11.9 200 each 6   No facility-administered medications prior to visit.     Allergies  Allergen Reactions  . Ramipril Diarrhea  . Lipitor [Atorvastatin]     Diarrhea, muscle cramps  . Lisinopril     myalgias    Review of Systems  Constitutional: Negative for fever and malaise/fatigue.  HENT: Negative for congestion.   Eyes: Negative for blurred vision.  Respiratory: Negative for cough and shortness of breath.   Cardiovascular: Negative for chest pain, palpitations and leg swelling.  Gastrointestinal: Negative for vomiting.  Musculoskeletal: Negative for back pain.  Skin: Negative for rash.  Neurological: Negative for loss of consciousness and headaches.       Objective:    Physical Exam  Constitutional: She is oriented to person, place, and time. She appears well-developed and well-nourished. No distress.  HENT:  Head: Normocephalic and atraumatic.  Eyes: Conjunctivae are normal.  Neck: Normal range of motion. No thyromegaly present.  Cardiovascular: Normal rate and regular rhythm.   Pulmonary/Chest: Effort normal and breath sounds normal. She has no wheezes.  Abdominal: Soft. Bowel sounds are normal. There is no tenderness.  Musculoskeletal: Normal range of motion. She exhibits no edema or deformity.  Neurological: She is alert and oriented to person, place, and time.  Skin: Skin is warm and dry. She is not diaphoretic.  Psychiatric: She has a normal mood and affect.    BP 118/64 (BP Location: Left Arm,  Patient Position: Sitting, Cuff Size: Normal)   Pulse 70   Temp 98.1 F (36.7 C) (Oral)   Resp 18   Wt 158 lb 3.2 oz (71.8 kg)   SpO2 98%   BMI 27.15 kg/m  Wt Readings from Last 3 Encounters:  05/23/17 158 lb 3.2 oz (71.8 kg)  02/15/17 163 lb 6.4 oz (74.1 kg)  11/09/16 160 lb 2 oz (72.6 kg)   BP Readings from Last 3 Encounters:  05/23/17 118/64  02/15/17 128/78  11/09/16 110/72  Immunization History  Administered Date(s) Administered  . Influenza, High Dose Seasonal PF 09/11/2016  . Influenza,inj,Quad PF,36+ Mos 09/02/2013, 08/31/2014, 09/21/2015  . Pneumococcal Conjugate-13 02/04/2014  . Pneumococcal Polysaccharide-23 01/12/2007  . Tdap 01/13/2008  . Zoster 01/12/2007    Health Maintenance  Topic Date Due  . FOOT EXAM  01/29/2017  . INFLUENZA VACCINE  07/24/2017  . OPHTHALMOLOGY EXAM  09/19/2017  . HEMOGLOBIN A1C  11/22/2017  . TETANUS/TDAP  01/12/2018  . COLONOSCOPY  07/18/2020  . DEXA SCAN  Completed  . PNA vac Low Risk Adult  Completed    Lab Results  Component Value Date   WBC 8.4 05/23/2017   HGB 13.2 05/23/2017   HCT 39.6 05/23/2017   PLT 307.0 05/23/2017   GLUCOSE 61 (L) 05/23/2017   CHOL 151 05/23/2017   TRIG 280.0 (H) 05/23/2017   HDL 46.30 05/23/2017   LDLDIRECT 74.0 05/23/2017   LDLCALC 74 02/15/2017   ALT 15 05/23/2017   AST 19 05/23/2017   NA 139 05/23/2017   K 3.9 05/23/2017   CL 106 05/23/2017   CREATININE 0.61 05/23/2017   BUN 15 05/23/2017   CO2 25 05/23/2017   TSH 1.80 05/23/2017   HGBA1C 7.1 (H) 05/23/2017   MICROALBUR 1.6 11/03/2015    Lab Results  Component Value Date   TSH 1.80 05/23/2017   Lab Results  Component Value Date   WBC 8.4 05/23/2017   HGB 13.2 05/23/2017   HCT 39.6 05/23/2017   MCV 89.6 05/23/2017   PLT 307.0 05/23/2017   Lab Results  Component Value Date   NA 139 05/23/2017   K 3.9 05/23/2017   CO2 25 05/23/2017   GLUCOSE 61 (L) 05/23/2017   BUN 15 05/23/2017   CREATININE 0.61 05/23/2017    BILITOT 0.4 05/23/2017   ALKPHOS 54 05/23/2017   AST 19 05/23/2017   ALT 15 05/23/2017   PROT 7.1 05/23/2017   ALBUMIN 4.2 05/23/2017   CALCIUM 9.5 05/23/2017   GFR 101.38 05/23/2017   Lab Results  Component Value Date   CHOL 151 05/23/2017   Lab Results  Component Value Date   HDL 46.30 05/23/2017   Lab Results  Component Value Date   LDLCALC 74 02/15/2017   Lab Results  Component Value Date   TRIG 280.0 (H) 05/23/2017   Lab Results  Component Value Date   CHOLHDL 3 05/23/2017   Lab Results  Component Value Date   HGBA1C 7.1 (H) 05/23/2017         Assessment & Plan:   Problem List Items Addressed This Visit    Diabetes mellitus type 2 in obese ([place]) - Primary    hgba1c acceptable, minimize simple carbs. Increase exercise as tolerated. Continue current meds      Relevant Medications   metFORMIN (GLUCOPHAGE-XR) 500 MG 24 hr tablet   Other Relevant Orders   Hemoglobin A1c (Completed)   CBC (Completed)   Comprehensive metabolic panel (Completed)   TSH (Completed)   Hyperlipidemia, mixed    Encouraged heart healthy diet, increase exercise, avoid trans fats, consider a krill oil cap daily      Relevant Orders   Lipid panel (Completed)   Overweight    Encouraged DASH diet, decrease po intake and increase exercise as tolerated. Needs 7-8 hours of sleep nightly. Avoid trans fats, eat small, frequent meals every 4-5 hours with lean proteins, complex carbs and healthy fats. Minimize simple carbs.      Constipation    Encouraged increased hydration and fiber  in diet. Daily probiotics. If bowels not moving can use MOM 2 tbls po in 4 oz of warm prune juice by mouth every 2-3 days. If no results then repeat in 4 hours with  Dulcolax suppository pr, may repeat again in 4 more hours as needed. Seek care if symptoms worsen. Consider daily Miralax and/or Dulcolax if symptoms persist.       Knee pain, left   Relevant Orders   Ambulatory referral to Sports Medicine    Osteopenia    Encouraged to get adequate exercise, calcium and vitamin d intake       Other Visit Diagnoses    Skin lesion of right ear       Relevant Orders   Ambulatory referral to Dermatology      I have discontinued Ms. Satterfield's ACCU-CHEK SOFTCLIX LANCETS. I have also changed her metFORMIN. Additionally, I am having her maintain her aspirin, Cholecalciferol, Lutein, b complex vitamins, multivitamin, MAGNESIUM PO, CALCIUM PO, vitamin C, Probiotic Product (PROBIOTIC DAILY PO), CoQ10, Turmeric, ACCU-CHEK AVIVA PLUS, ACCU-CHEK AVIVA PLUS, Krill Oil, glimepiride, losartan, and rosuvastatin.  Meds ordered this encounter  Medications  . metFORMIN (GLUCOPHAGE-XR) 500 MG 24 hr tablet    Sig: Take 2 tablets (1,000 mg total) by mouth every morning.    Dispense:  180 tablet    Refill:  2    CMA served as scribe during this visit. History, Physical and Plan performed by medical provider. Documentation and orders reviewed and attested to.  Penni Homans, MD

## 2017-05-23 NOTE — Assessment & Plan Note (Signed)
Encouraged to get adequate exercise, calcium and vitamin d intake 

## 2017-05-23 NOTE — Assessment & Plan Note (Signed)
hgba1c acceptable, minimize simple carbs. Increase exercise as tolerated. Continue current meds 

## 2017-05-23 NOTE — Assessment & Plan Note (Signed)
Encouraged increased hydration and fiber in diet. Daily probiotics. If bowels not moving can use MOM 2 tbls po in 4 oz of warm prune juice by mouth every 2-3 days. If no results then repeat in 4 hours with  Dulcolax suppository pr, may repeat again in 4 more hours as needed. Seek care if symptoms worsen. Consider daily Miralax and/or Dulcolax if symptoms persist.  

## 2017-05-27 DIAGNOSIS — L820 Inflamed seborrheic keratosis: Secondary | ICD-10-CM | POA: Diagnosis not present

## 2017-05-28 ENCOUNTER — Ambulatory Visit: Payer: Self-pay

## 2017-05-28 ENCOUNTER — Ambulatory Visit (INDEPENDENT_AMBULATORY_CARE_PROVIDER_SITE_OTHER): Payer: Medicare Other | Admitting: Sports Medicine

## 2017-05-28 ENCOUNTER — Encounter: Payer: Self-pay | Admitting: Sports Medicine

## 2017-05-28 ENCOUNTER — Ambulatory Visit (INDEPENDENT_AMBULATORY_CARE_PROVIDER_SITE_OTHER): Payer: Medicare Other

## 2017-05-28 VITALS — BP 122/80 | HR 66 | Ht 64.0 in | Wt 155.6 lb

## 2017-05-28 DIAGNOSIS — M1712 Unilateral primary osteoarthritis, left knee: Secondary | ICD-10-CM | POA: Diagnosis not present

## 2017-05-28 DIAGNOSIS — M25562 Pain in left knee: Secondary | ICD-10-CM

## 2017-05-28 DIAGNOSIS — M25462 Effusion, left knee: Secondary | ICD-10-CM

## 2017-05-28 NOTE — Patient Instructions (Signed)
If you are not better please let me know and we would be happy to see you back in 2 weeks.

## 2017-05-28 NOTE — Assessment & Plan Note (Signed)
Symptoms are consistent with likely degenerative medial meniscal tear. Avoid exacerbating activities.  If any worsening symptoms follow-up in 2 weeks. ++++++++++++++++++++++++++++++++++++++++++++ PROCEDURE NOTE - ULTRASOUND GUIDED INJECTION: Left knee Images were obtained and interpreted by myself, Teresa Coombs, DO  Images have been saved and stored to PACS system. Images obtained on: GE S7 Ultrasound machine  ULTRASOUND FINDINGS: Small supraphysiologic effusion with positive mushroom sign over the medial meniscus  DESCRIPTION OF PROCEDURE:  The patient's clinical condition is marked by substantial pain and/or significant functional disability. Other conservative therapy has not provided relief, is contraindicated, or not appropriate. There is a reasonable likelihood that injection will significantly improve the patient's pain and/or functional impairment. After discussing the risks, benefits and expected outcomes of the injection and all questions were reviewed and answered, the patient wished to undergo the above named procedure. Verbal consent was obtained. The ultrasound was used to identify the target structure and adjacent neurovascular structures. The skin was then prepped in sterile fashion and the target structure was injected under direct visualization using sterile technique as below: PREP: Alcohol, Ethel Chloride APPROACH: Superiolateral, stopcock technique, 21g 2" needle INJECTATE: 2cc 1% lidcaine, 2cc 40mg  DepoMedrol ASPIRATE: N/A DRESSING: Band-Aid  Post procedural instructions including recommending icing and warning signs for infection were reviewed. This procedure was well tolerated and there were no complications.   IMPRESSION: Succesful US Guided injection

## 2017-05-28 NOTE — Progress Notes (Signed)
OFFICE VISIT NOTE Gail Lester. Gail Lester, [place] [facility] [place]  Gail Lester - 76 y.o. female MRN [id number]  Date of birth: [birth year]  Visit Date: 05/28/2017  PCP: Gail Lukes, MD   Referred by: Gail Lukes, MD  Gail Lester, CMA acting as scribe for Dr. Paulla Fore.  SUBJECTIVE:   Chief Complaint  Patient presents with  . pain in left knee   HPI: As below and per problem based documentation when appropriate.  Pt presents today with complaint of pain in his left knee. No recent xray of the knee.  Pain started about 1 month ago after stumbling in the park while walking her dog.   The pain is described as aching pain in the morning but stabbing pain while walking and is rated as 4/10.  Worsened with walking Improves with Ibuprofen and rest Therapies tried include : Aspercreme and Ibuprofen. Pt gets relief with the Ibuprofen.    Other associated symptoms include: Pt denies swelling of the knee or increased warmth. Pain seems to be mostly on the medial aspect of the knee.   Pt denies fever, chills, night sweats, unintentional weight gain or loss.     Review of Systems  Constitutional: Negative for chills and fever.  Respiratory: Negative for shortness of breath and wheezing.   Cardiovascular: Negative for chest pain, palpitations and leg swelling.  Musculoskeletal: Positive for falls (6-8 weeks ago).  Neurological: Positive for tingling (right arm, tingling in the morning). Negative for dizziness and headaches.  Endo/Heme/Allergies: Does not bruise/bleed easily.    Otherwise per HPI.  HISTORY & PERTINENT PRIOR DATA:  No specialty comments available. She reports that she quit smoking about 27 years ago. She has quit using smokeless tobacco.   Recent Labs  11/09/16 1551 02/15/17 1006 05/23/17 1412  HGBA1C 7.2* 7.5* 7.1*   Medications & Allergies reviewed per EMR Patient Active Problem List   Diagnosis Date  Noted  . Osteopenia 05/23/2017  . Onychomycosis 02/24/2017  . Constipation 02/15/2017  . Acute pain of left knee 02/15/2017  . Preventative health care 11/09/2016  . Medicare annual wellness visit, subsequent 11/06/2015  . 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 Keratosis, seborrheic 08/01/2015  . Rotator cuff arthropathy 11/24/2014  . Ganglion cyst 11/14/2014  . Left arm pain 11/14/2014  . Overweight 06/14/2014  . Other malaise and fatigue 08/04/2013  . Diarrhea 02/10/2013  . Diabetes mellitus type 2 in obese ([place]) 02/10/2013  . Hyperlipidemia, mixed 02/10/2013  . Fibrocystic breast 02/10/2013  . Dupuytren's contracture of both hands 02/10/2013   Past Medical History:  Diagnosis Date  . Arthritis   . Chicken pox as a child  . Constipation 02/15/2017  . Cough 03/24/2015  . Diabetes ([place])   . Diabetes mellitus   . Diabetes mellitus type 2 in obese [facility]) 02/10/2013   Saw Dr Delman Cheadle, eye doctor in 01/01/13, exam in chart   . Diarrhea 02/10/2013  . Dupuytren's contracture of both hands 02/10/2013  . Encounter for Medicare annual wellness exam 08/04/2013  . Fibrocystic breast 02/10/2013   Left h/o FN biopsy   . Ganglion cyst 11/14/2014   Right 2nd finger  . Hypercholesteremia   . Keratosis, seborrheic 08/01/2015  . Knee pain, left 02/15/2017  . Left arm pain 11/14/2014  . Measles as a child  . Medicare annual wellness visit, subsequent 11/06/2015  . 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 Mumps as a child  .  Osteopenia 05/23/2017  . Other and unspecified hyperlipidemia 02/10/2013  . Other malaise and fatigue 08/04/2013  . Overweight 06/14/2014  . Preventative health care 11/09/2016   Family History  Problem Relation Age of Onset  . Kidney disease Mother   . Diabetes Father   . Kidney disease Maternal Grandmother   . Cancer Maternal Grandmother        bladder  . Cancer Maternal Aunt   . Alzheimer's disease Maternal Aunt   . Scoliosis Son   . Colon cancer Unknown         grandparent   Past Surgical History:  Procedure Laterality Date  . APPENDECTOMY  1955  . BREAST BIOPSY  1985-1990   benign  . ORIF WRIST FRACTURE  12/09/2011   Procedure: OPEN REDUCTION INTERNAL FIXATION (ORIF) WRIST FRACTURE;  Surgeon: Linna Hoff;  Location: [facility];  Service: Orthopedics;  Laterality: Left;   Social History   Occupational History  . Not on file.   Social History Main Topics  . Smoking status: Former Smoker    Quit date: 11/17/1989  . Smokeless tobacco: Former Systems developer  . Alcohol use No  . Drug use: No  . Sexual activity: No    OBJECTIVE:  VS:  HT:5\' 4"  (162.6 cm)   WT:155 lb 9.6 oz (70.6 kg)  BMI:26.8    BP:122/80  HR:66bpm  TEMP: ( )  RESP:97 % EXAM: Findings:  WDWN, NAD, Non-toxic appearing Alert & appropriately interactive Not depressed or anxious appearing No increased work of breathing. Pupils are equal. EOM intact without nystagmus No clubbing or cyanosis of the extremities appreciated No significant rashes/lesions/ulcerations overlying the examined area. DP & PT pulses 2+/4.  No significant pretibial edema.  Marked varicosities Sensation intact to light touch in lower extremities.  Left Knee: Overall joint is well aligned, no significant deformity.   Small supraphysiologic effusion. ROM: 0 to 120.   Extensor mechanism intact Focal medial joint line tenderness over the anterior aspect..   Stable to varus/valgus strain & anterior/posterior drawer.   .   Pain with McMurray's  with external rotation.      Dg Knee 1-2 Views Left  Result Date: 05/28/2017 CLINICAL DATA:  Nagging pain LEFT knee medial to the patella, no known injury, question arthritis, history diabetes mellitus EXAM: LEFT KNEE - 1-2 VIEW COMPARISON:  None FINDINGS: Diffuse osseous demineralization. Minimal medial compartment joint space narrowing. Small spur at cranial margin of patella. Small LEFT knee joint effusion. No acute fracture, dislocation, or bone destruction. AP  comparison view of the RIGHT knee shows osseous demineralization but is otherwise unremarkable. IMPRESSION: Mild degenerative changes and small joint effusion LEFT knee. Electronically Signed   By: Lavonia Dana M.D.   On: 05/28/2017 08:45   ASSESSMENT & PLAN:   Problem List Items Addressed This Visit    Acute pain of left knee - Primary    Symptoms are consistent with likely degenerative medial meniscal tear. Avoid exacerbating activities.  If any worsening symptoms follow-up in 2 weeks. ++++++++++++++++++++++++++++++++++++++++++++ PROCEDURE NOTE - ULTRASOUND GUIDED INJECTION: Left knee Images were obtained and interpreted by myself, Teresa Coombs, DO  Images have been saved and stored to PACS system. Images obtained on: GE S7 Ultrasound machine  ULTRASOUND FINDINGS: Small supraphysiologic effusion with positive mushroom sign over the medial meniscus  DESCRIPTION OF PROCEDURE:  The patient's clinical condition is marked by substantial pain and/or significant functional disability. Other conservative therapy has not provided relief, is contraindicated, or not appropriate. There is a reasonable  likelihood that injection will significantly improve the patient's pain and/or functional impairment. After discussing the risks, benefits and expected outcomes of the injection and all questions were reviewed and answered, the patient wished to undergo the above named procedure. Verbal consent was obtained. The ultrasound was used to identify the target structure and adjacent neurovascular structures. The skin was then prepped in sterile fashion and the target structure was injected under direct visualization using sterile technique as below: PREP: Alcohol, Ethel Chloride APPROACH: Superiolateral, stopcock technique, 21g 2" needle INJECTATE: 2cc 1% lidcaine, 2cc 40mg  DepoMedrol ASPIRATE: N/A DRESSING: Band-Aid  Post procedural instructions including recommending icing and warning signs for infection  were reviewed. This procedure was well tolerated and there were no complications.   IMPRESSION: Succesful US Guided injection         Other Visit Diagnoses    Effusion of left knee          Follow-up: No Follow-up on file.   CMA/ATC served as Education administrator during this visit. History, Physical, and Plan performed by medical provider. Documentation and orders reviewed and attested to.      Teresa Coombs, [facility] Physician

## 2017-07-01 DIAGNOSIS — D692 Other nonthrombocytopenic purpura: Secondary | ICD-10-CM | POA: Diagnosis not present

## 2017-07-01 DIAGNOSIS — L820 Inflamed seborrheic keratosis: Secondary | ICD-10-CM | POA: Diagnosis not present

## 2017-07-01 DIAGNOSIS — D2239 Melanocytic nevi of other parts of face: Secondary | ICD-10-CM | POA: Diagnosis not present

## 2017-07-01 DIAGNOSIS — D1801 Hemangioma of skin and subcutaneous tissue: Secondary | ICD-10-CM | POA: Diagnosis not present

## 2017-07-01 DIAGNOSIS — L814 Other melanin hyperpigmentation: Secondary | ICD-10-CM | POA: Diagnosis not present

## 2017-07-17 ENCOUNTER — Other Ambulatory Visit: Payer: Self-pay | Admitting: Family Medicine

## 2017-07-17 DIAGNOSIS — E1169 Type 2 diabetes mellitus with other specified complication: Secondary | ICD-10-CM

## 2017-07-17 DIAGNOSIS — E669 Obesity, unspecified: Principal | ICD-10-CM

## 2017-09-17 DIAGNOSIS — E119 Type 2 diabetes mellitus without complications: Secondary | ICD-10-CM | POA: Diagnosis not present

## 2017-09-17 LAB — HM DIABETES EYE EXAM

## 2017-10-01 ENCOUNTER — Encounter: Payer: Self-pay | Admitting: Family Medicine

## 2017-10-03 ENCOUNTER — Ambulatory Visit: Payer: Medicare Other

## 2017-10-03 DIAGNOSIS — Z23 Encounter for immunization: Secondary | ICD-10-CM

## 2017-10-03 NOTE — Progress Notes (Signed)
Pre visit review using our clinic tool,if applicable. No additional management support is needed unless otherwise documented below in the visit note.  

## 2017-10-23 ENCOUNTER — Other Ambulatory Visit: Payer: Self-pay | Admitting: Family Medicine

## 2017-11-11 NOTE — Progress Notes (Signed)
Subjective:   Gail Lester is a 76 y.o. female who presents for Medicare Annual (Subsequent) preventive examination.  Review of Systems:  No ROS.  Medicare Wellness Visit. Additional risk factors are reflected in the social history.  Cardiac Risk Factors include: advanced age (>69mn, >>71women);diabetes mellitus;dyslipidemia Sleep patterns: Pt states she sleeps very well for about 7 hrs.  Female:   Pap-  No longer doing routine screening due to age.     Mammo- last 11/13/16: BI-RADS CATEGORY  1: Negative.   ORDERED TODAY Dexa scan- ORDERED TODAY        CCS- last 07/18/10: recall 5 yrs. Pt will discuss with PCP today     Objective:     Vitals: BP 118/78 (BP Location: Left Arm, Patient Position: Sitting, Cuff Size: Normal)   Pulse 72   Ht _0  (1.626 m)   Wt 158 lb 9.6 oz (71.9 kg)   SpO2 98%   BMI 27.22 kg/m   Body mass index is 27.22 kg/m.   Tobacco Social History   Tobacco Use  Smoking Status Former Smoker  . Last attempt to quit: 11/17/1989  . Years since quitting: 28.0  Smokeless Tobacco Former UEngineer, structuralgiven: Not Answered   Past Medical History:  Diagnosis Date  . Arthritis   . Chicken pox as a child  . Constipation 02/15/2017  . Cough 03/24/2015  . Diabetes [place]   . Diabetes mellitus   . Diabetes mellitus type 2 in obese ([facility] 02/10/2013   Saw Dr GDelman Cheadle eye doctor in 01/01/13, exam in chart   . Diarrhea 02/10/2013  . Dupuytren's contracture of both hands 02/10/2013  . Encounter for Medicare annual wellness exam 08/04/2013  . Fibrocystic breast 02/10/2013   Left h/o FN biopsy   . Ganglion cyst 11/14/2014   Right 2nd finger  . Hypercholesteremia   . Keratosis, seborrheic 08/01/2015  . Knee pain, left 02/15/2017  . Left arm pain 11/14/2014  . Measles as a child  . Medicare annual wellness visit, subsequent 11/06/2015  . 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 Mumps as a child  . Osteopenia 05/23/2017  . Other and unspecified hyperlipidemia  02/10/2013  . Other malaise and fatigue 08/04/2013  . Overweight 06/14/2014  . Preventative health care 11/09/2016   Past Surgical History:  Procedure Laterality Date  . APPENDECTOMY  1955  . BREAST BIOPSY  1985-1990   benign  . ORIF WRIST FRACTURE  12/09/2011   Procedure: OPEN REDUCTION INTERNAL FIXATION (ORIF) WRIST FRACTURE;  Surgeon: [place]inna Hoff  [place]  Service: Orthopedics;  Laterality: Left;   Family History  Problem Relation Age of Onset  . Kidney disease Mother   . Diabetes Father   . Kidney disease Maternal Grandmother   . Cancer Maternal Grandmother        bladder  . Cancer Maternal Aunt   . Alzheimer's disease Maternal Aunt   . Scoliosis Son   . Colon cancer Unknown        grandparent   Social History   Substance and Sexual Activity  Sexual Activity Yes    Outpatient Encounter Medications as of 11/21/2017  Medication Sig  . ACCU-CHEK AVIVA PLUS test strip Check Blood Sugar QID and prn labile blood sugars  . aspirin 81 MG tablet Take 81 mg by mouth daily.  .Marland Kitchenb complex vitamins tablet Take 1 tablet by mouth as directed. Two times a week  . Blood Glucose Monitoring Suppl (  ACCU-CHEK AVIVA PLUS) W/DEVICE KIT Use as directed  . CALCIUM PO Take 400 mg by mouth daily.  . Cholecalciferol (D-3-5) 5000 UNITS capsule Take 4,000 Units by mouth daily.   . Coenzyme Q10 (COQ10) 100 MG CAPS Take 1 capsule by mouth daily.  Marland Kitchen glimepiride (AMARYL) 4 MG tablet TAKE 1 TABLET BY MOUTH TWO  TIMES DAILY  . losartan (COZAAR) 25 MG tablet TAKE 1 TABLET BY MOUTH  DAILY  . Lutein 20 MG CAPS Take by mouth.  Marland Kitchen MAGNESIUM PO Take 250 mg by mouth daily.   . metFORMIN (GLUCOPHAGE-XR) 500 MG 24 hr tablet TAKE 1 TABLET BY MOUTH 3  TIMES DAILY AS NEEDED (Patient taking differently: TAKE 1 TABLET BY MOUTH 1 TIMES DAILY AS NEEDED)  . Multiple Vitamin (MULTIVITAMIN) tablet Take 1 tablet by mouth as directed. Two times a week  . Probiotic Product (PROBIOTIC DAILY PO) Take 1 capsule by  mouth daily.  . rosuvastatin (CRESTOR) 20 MG tablet Take 0.5 tablets (10 mg total) by mouth daily.  Gail Lester Oil 500 MG CAPS Take 1 capsule by mouth daily.  . Turmeric 500 MG CAPS Take by mouth daily.  . vitamin C (ASCORBIC ACID) 500 MG tablet Take 500 mg by mouth 2 (two) times daily.   No facility-administered encounter medications on file as of 11/21/2017.     Activities of Daily Living In your present state of health, do you have any difficulty performing the following activities: 11/21/2017  Hearing? N  Vision? N  Comment Glasses for reading and driving.   Difficulty concentrating or making decisions? N  Walking or climbing stairs? N  Dressing or bathing? N  Doing errands, shopping? N  Preparing Food and eating ? N  Using the Toilet? N  In the past six months, have you accidently leaked urine? N  Do you have problems with loss of bowel control? N  Managing your Medications? N  Managing your Finances? N  Housekeeping or managing your Housekeeping? N  Some recent data might be hidden    Patient Care Team: Mosie Lukes, MD as PCP - General (Family Medicine) Sharyne Peach, MD as Consulting Physician (Ophthalmology) Martinique, Amy, MD as Consulting Physician (Dermatology)    Assessment:    Physical assessment deferred to PCP.  Exercise Activities and Dietary recommendations Current Exercise Habits: Home exercise routine, Type of exercise: walking, Time (Minutes): 20, Frequency (Times/Week): 7, Weekly Exercise (Minutes/Week): 140, Intensity: Mild   Diet (meal preparation, eat out, water intake, caffeinated beverages, dairy products, fruits and vegetables): in general, a "healthy" diet    Goals    . Increase physical activity (pt-stated)    . Travel across country with your new dog Gail Lester (pt-stated)      Fall Risk Fall Risk  11/21/2017 11/09/2016 03/28/2016 11/08/2014  Falls in the past year? No Yes No No  Number falls in past yr: - 2 or more - -  Injury with Fall? -  No - -   Depression Screen PHQ 2/9 Scores 11/21/2017 11/09/2016 03/28/2016 11/08/2014  PHQ - 2 Score 0 0 0 0     Cognitive Function MMSE - Mini Mental State Exam 11/21/2017 11/09/2016  Orientation to time 5 5  Orientation to Place 5 5  Registration 3 3  Attention/ Calculation 5 5  Recall 3 3  Language- name 2 objects 2 2  Language- repeat 1 1  Language- follow 3 step command 3 3  Language- read & follow direction 1 1  Write a sentence  1 1  Copy design 1 1  Total score 30 30        Immunization History  Administered Date(s) Administered  . Influenza, High Dose Seasonal PF 09/11/2016, 10/03/2017  . Influenza,inj,Quad PF,6+ Mos 09/02/2013, 08/31/2014, 09/21/2015  . Pneumococcal Conjugate-13 02/04/2014  . Pneumococcal Polysaccharide-23 01/12/2007  . Tdap 01/13/2008  . Zoster 01/12/2007   Screening Tests Health Maintenance  Topic Date Due  . FOOT EXAM  01/29/2017  . HEMOGLOBIN A1C  11/22/2017  . TETANUS/TDAP  01/12/2018  . OPHTHALMOLOGY EXAM  09/17/2018  . INFLUENZA VACCINE  Completed  . DEXA SCAN  Completed  . PNA vac Low Risk Adult  Completed      Plan:   Follow up with Dr.Blyth today as scheduled.  Continue to eat heart healthy diet (full of fruits, vegetables, whole grains, lean protein, water--limit salt, fat, and sugar intake) and increase physical activity as tolerated.  Continue doing brain stimulating activities (puzzles, reading, adult coloring books, staying active) to keep memory sharp.   I have ordered your mammogram and bone density scan.   I have personally reviewed and noted the following in the patient's chart:   . Medical and social history . Use of alcohol, tobacco or illicit drugs  . Current medications and supplements . Functional ability and status . Nutritional status . Physical activity . Advanced directives . List of other physicians . Hospitalizations, surgeries, and ER visits in previous 12 months . Vitals . Screenings to include  cognitive, depression, and falls . Referrals and appointments  In addition, I have reviewed and discussed with patient certain preventive protocols, quality metrics, and best practice recommendations. A written personalized care plan for preventive services as well as general preventive health recommendations were provided to patient.     Shela Nevin, [place]  11/21/2017

## 2017-11-21 ENCOUNTER — Ambulatory Visit (HOSPITAL_BASED_OUTPATIENT_CLINIC_OR_DEPARTMENT_OTHER)
Admission: RE | Admit: 2017-11-21 | Discharge: 2017-11-21 | Disposition: A | Discharge status: Home / Self Care | Payer: Medicare Other | Source: Ambulatory Visit | Attending: Family Medicine | Admitting: Family Medicine

## 2017-11-21 ENCOUNTER — Ambulatory Visit: Payer: Medicare Other | Admitting: *Deleted

## 2017-11-21 ENCOUNTER — Encounter (HOSPITAL_BASED_OUTPATIENT_CLINIC_OR_DEPARTMENT_OTHER): Payer: Self-pay

## 2017-11-21 ENCOUNTER — Encounter: Payer: Self-pay | Admitting: Family Medicine

## 2017-11-21 ENCOUNTER — Ambulatory Visit (INDEPENDENT_AMBULATORY_CARE_PROVIDER_SITE_OTHER): Payer: Medicare Other | Admitting: Family Medicine

## 2017-11-21 VITALS — BP 118/78 | HR 72 | Ht 64.0 in | Wt 158.6 lb

## 2017-11-21 DIAGNOSIS — Z860100 Personal history of colon polyps, unspecified: Secondary | ICD-10-CM

## 2017-11-21 DIAGNOSIS — Z1231 Encounter for screening mammogram for malignant neoplasm of breast: Secondary | ICD-10-CM | POA: Insufficient documentation

## 2017-11-21 DIAGNOSIS — E669 Obesity, unspecified: Secondary | ICD-10-CM | POA: Diagnosis not present

## 2017-11-21 DIAGNOSIS — K5900 Constipation, unspecified: Secondary | ICD-10-CM

## 2017-11-21 DIAGNOSIS — Z0000 Encounter for general adult medical examination without abnormal findings: Secondary | ICD-10-CM | POA: Diagnosis not present

## 2017-11-21 DIAGNOSIS — Z780 Asymptomatic menopausal state: Secondary | ICD-10-CM | POA: Diagnosis not present

## 2017-11-21 DIAGNOSIS — E119 Type 2 diabetes mellitus without complications: Secondary | ICD-10-CM

## 2017-11-21 DIAGNOSIS — Z1239 Encounter for other screening for malignant neoplasm of breast: Secondary | ICD-10-CM

## 2017-11-21 DIAGNOSIS — E1169 Type 2 diabetes mellitus with other specified complication: Secondary | ICD-10-CM

## 2017-11-21 DIAGNOSIS — M8580 Other specified disorders of bone density and structure, unspecified site: Secondary | ICD-10-CM

## 2017-11-21 DIAGNOSIS — E782 Mixed hyperlipidemia: Secondary | ICD-10-CM | POA: Diagnosis not present

## 2017-11-21 HISTORY — DX: Personal history of colonic polyps: Z86.010

## 2017-11-21 HISTORY — DX: Personal history of colon polyps, unspecified: Z86.0100

## 2017-11-21 LAB — COMPREHENSIVE METABOLIC PANEL
ALK PHOS: 53 U/L (ref 39–117)
ALT: 16 U/L (ref 0–35)
AST: 18 U/L (ref 0–37)
Albumin: 4.2 g/dL (ref 3.5–5.2)
BILIRUBIN TOTAL: 0.4 mg/dL (ref 0.2–1.2)
BUN: 19 mg/dL (ref 6–23)
CO2: 28 mEq/L (ref 19–32)
Calcium: 9.5 mg/dL (ref 8.4–10.5)
Chloride: 104 mEq/L (ref 96–112)
Creatinine, Ser: 0.68 mg/dL (ref 0.40–1.20)
GFR: 89.32 mL/min (ref >60.00)
GLUCOSE: 117 mg/dL — AB (ref 70–99)
Potassium: 3.8 mEq/L (ref 3.5–5.1)
SODIUM: 139 meq/L (ref 135–145)
TOTAL PROTEIN: 7.2 g/dL (ref 6.0–8.3)

## 2017-11-21 LAB — LIPID PANEL
Cholesterol: 155 mg/dL (ref 0–200)
HDL: 48.3 mg/dL (ref >39.00)
LDL Cholesterol: 84 mg/dL (ref 0–99)
NONHDL: 107.13
Total CHOL/HDL Ratio: 3
Triglycerides: 117 mg/dL (ref 0.0–149.0)
VLDL: 23.4 mg/dL (ref 0.0–40.0)

## 2017-11-21 LAB — CBC
HCT: 41 % (ref 36.0–46.0)
Hemoglobin: 13.5 g/dL (ref 12.0–15.0)
MCHC: 33 g/dL (ref 30.0–36.0)
MCV: 91 fl (ref 78.0–100.0)
Platelets: 276 10*3/uL (ref 150.0–400.0)
RBC: 4.5 Mil/uL (ref 3.87–5.11)
RDW: 13 % (ref 11.5–15.5)
WBC: 7.5 10*3/uL (ref 4.0–10.5)

## 2017-11-21 LAB — HEMOGLOBIN A1C: HEMOGLOBIN A1C: 7.5 % — AB (ref 4.6–6.5)

## 2017-11-21 LAB — TSH: TSH: 2.83 u[IU]/mL (ref 0.35–4.50)

## 2017-11-21 MED ORDER — CARISOPRODOL 350 MG PO TABS: 350.0000 mg | ORAL_TABLET | Freq: Two times a day (BID) | ORAL | 1 refills | Status: DC | PRN

## 2017-11-21 NOTE — Assessment & Plan Note (Addendum)
Encouraged increased hydration and fiber in diet. Daily probiotics. If bowels not moving can use MOM 2 tbls po in 4 oz of warm prune juice by mouth every 2-3 days. If no results then repeat in 4 hours with  Dulcolax suppository pr, may repeat again in 4 more hours as needed. Seek care if symptoms worsen. Consider daily Miralax and/or Dulcolax if symptoms persist. Benefiber and/or Miralax prn. Encouraged to proceed with a final colonoscopy.

## 2017-11-21 NOTE — Assessment & Plan Note (Signed)
Encouraged to get adequate exercise, calcium and vitamin d intake. Dexa scan

## 2017-11-21 NOTE — Assessment & Plan Note (Signed)
Adenoma in 2011 encouraged repeat colonoscopy

## 2017-11-21 NOTE — Assessment & Plan Note (Signed)
Patient encouraged to maintain heart healthy diet, regular exercise, adequate sleep. Consider daily probiotics. Take medications as prescribed 

## 2017-11-21 NOTE — Progress Notes (Signed)
Patient ID: Gail Lester, female   DOB: [birth year], 76 y.o.   MRN: [id number]   Subjective:    Patient ID: Gail Lester, female    DOB: 1941/01/05, 76 y.o.   MRN: [id number]  Chief Complaint  Patient presents with  . Medicare Wellness    With RN    HPI Patient is in today for annual physical exam and follow up on chronic medical concerns. She feels well today. No recent febrile illness or hospitalizations. No polyuria or polydipsia. Sugars are running 70 to 180 since she dropped her Metformin to 500 mg daily. She dropped it due to low sugars down into the 50s and feeling shaky and weak. Continues to have intermittent neck pain on left uses Soma rarely maybe 2-3 x a year. Needs a refill. Is doing well. Notes she is clumsier, she drops things more. No falls or injury. Is doing well with ADLS. No recent febrile illness or hospitalizations. Denies CP/palp/SOB/HA/congestion/fevers/GI or GU c/o. Taking meds as prescribed  Past Medical History:  Diagnosis Date  . Arthritis   . Chicken pox as a child  . Constipation 02/15/2017  . Cough 03/24/2015  . Diabetes ([place])   . Diabetes mellitus   . Diabetes mellitus type 2 in obese [facility]) 02/10/2013   Saw Dr Delman Cheadle, eye doctor in 01/01/13, exam in chart   . Diarrhea 02/10/2013  . Dupuytren's contracture of both hands 02/10/2013  . Encounter for Medicare annual wellness exam 08/04/2013  . Fibrocystic breast 02/10/2013   Left h/o FN biopsy   . Ganglion cyst 11/14/2014   Right 2nd finger  . Hypercholesteremia   . Keratosis, seborrheic 08/01/2015  . Knee pain, left 02/15/2017  . Left arm pain 11/14/2014  . Measles as a child  . Medicare annual wellness visit, subsequent 11/06/2015  . 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 Mumps as a child  . Osteopenia 05/23/2017  . Other and unspecified hyperlipidemia 02/10/2013  . Other malaise and fatigue 08/04/2013  . Overweight 06/14/2014  . Preventative health care 11/09/2016    Past Surgical History:  Procedure  Laterality Date  . APPENDECTOMY  1955  . BREAST BIOPSY  1985-1990   benign  . ORIF WRIST FRACTURE  12/09/2011   Procedure: OPEN REDUCTION INTERNAL FIXATION (ORIF) WRIST FRACTURE;  Surgeon: Linna Hoff;  Location: [place];  Service: Orthopedics;  Laterality: Left;    Family History  Problem Relation Age of Onset  . Kidney disease Mother   . Diabetes Father   . Kidney disease Maternal Grandmother   . Cancer Maternal Grandmother        bladder  . Cancer Maternal Aunt   . Alzheimer's disease Maternal Aunt   . Scoliosis Son   . Colon cancer Unknown        grandparent    Social History   Socioeconomic History  . Marital status: Divorced    Spouse name: Not on file  . Number of children: Not on file  . Years of education: Not on file  . Highest education level: Not on file  Social Needs  . Financial resource strain: Not on file  . Food insecurity - worry: Not on file  . Food insecurity - inability: Not on file  . Transportation needs - medical: Not on file  . Transportation needs - non-medical: Not on file  Occupational History  . Not on file  Tobacco Use  . Smoking status: Former Smoker    Last attempt to quit: 11/17/1989  Years since quitting: 28.0  . Smokeless tobacco: Former Network engineer and Sexual Activity  . Alcohol use: No  . Drug use: No  . Sexual activity: Yes  Other Topics Concern  . Not on file  Social History Narrative  . Not on file    Outpatient Medications Prior to Visit  Medication Sig Dispense Refill  . ACCU-CHEK AVIVA PLUS test strip Check Blood Sugar QID and prn labile blood sugars 450 each 3  . aspirin 81 MG tablet Take 81 mg by mouth daily.    Marland Kitchen b complex vitamins tablet Take 1 tablet by mouth as directed. Two times a week    . Blood Glucose Monitoring Suppl (ACCU-CHEK AVIVA PLUS) W/DEVICE KIT Use as directed 1 kit 0  . CALCIUM PO Take 400 mg by mouth daily.    . Cholecalciferol (D-3-5) 5000 UNITS capsule Take 4,000 Units by mouth daily.      . Coenzyme Q10 (COQ10) 100 MG CAPS Take 1 capsule by mouth daily.    Marland Kitchen glimepiride (AMARYL) 4 MG tablet TAKE 1 TABLET BY MOUTH TWO  TIMES DAILY 180 tablet 0  . losartan (COZAAR) 25 MG tablet TAKE 1 TABLET BY MOUTH  DAILY 90 tablet 1  . Lutein 20 MG CAPS Take by mouth.    Marland Kitchen MAGNESIUM PO Take 250 mg by mouth daily.     . metFORMIN (GLUCOPHAGE-XR) 500 MG 24 hr tablet TAKE 1 TABLET BY MOUTH 3  TIMES DAILY AS NEEDED (Patient taking differently: TAKE 1 TABLET BY MOUTH 1 TIMES DAILY AS NEEDED) 270 tablet 1  . Multiple Vitamin (MULTIVITAMIN) tablet Take 1 tablet by mouth as directed. Two times a week    . Probiotic Product (PROBIOTIC DAILY PO) Take 1 capsule by mouth daily.    . rosuvastatin (CRESTOR) 20 MG tablet Take 0.5 tablets (10 mg total) by mouth daily. 45 tablet 3  . Krill Oil 500 MG CAPS Take 1 capsule by mouth daily.    . Turmeric 500 MG CAPS Take by mouth daily.    . vitamin C (ASCORBIC ACID) 500 MG tablet Take 500 mg by mouth 2 (two) times daily.     No facility-administered medications prior to visit.     Allergies  Allergen Reactions  . Ramipril Diarrhea  . Lipitor [Atorvastatin]     Diarrhea, muscle cramps  . Lisinopril     myalgias    Review of Systems  Constitutional: Negative for chills, fever and malaise/fatigue.  HENT: Negative for congestion and hearing loss.   Eyes: Negative for discharge.  Respiratory: Negative for cough, sputum production and shortness of breath.   Cardiovascular: Negative for chest pain, palpitations and leg swelling.  Gastrointestinal: Positive for constipation. Negative for abdominal pain, blood in stool, diarrhea, heartburn, nausea and vomiting.  Genitourinary: Negative for dysuria, frequency, hematuria and urgency.  Musculoskeletal: Negative for back pain, falls and myalgias.  Skin: Negative for rash.  Neurological: Negative for dizziness, sensory change, loss of consciousness, weakness and headaches.  Endo/Heme/Allergies: Negative for  environmental allergies. Does not bruise/bleed easily.  Psychiatric/Behavioral: Negative for depression and suicidal ideas. The patient is not nervous/anxious and does not have insomnia.        Objective:    Physical Exam  Constitutional: She is oriented to person, place, and time. She appears well-developed and well-nourished. No distress.  HENT:  Head: Normocephalic and atraumatic.  Eyes: Conjunctivae are normal.  Neck: Neck supple. No thyromegaly present.  Cardiovascular: Normal rate, regular rhythm and normal heart  sounds.  No murmur heard. Pulmonary/Chest: Effort normal and breath sounds normal. No respiratory distress.  Abdominal: Soft. Bowel sounds are normal. She exhibits no distension and no mass. There is no tenderness.  Musculoskeletal: She exhibits no edema.  Lymphadenopathy:    She has no cervical adenopathy.  Neurological: She is alert and oriented to person, place, and time.  Skin: Skin is warm and dry.  Psychiatric: She has a normal mood and affect. Her behavior is normal.    BP 118/78 (BP Location: Left Arm, Patient Position: Sitting, Cuff Size: Normal)   Pulse 72   Ht '5\' 4"'  (1.626 m)   Wt 158 lb 9.6 oz (71.9 kg)   SpO2 98%   BMI 27.22 kg/m  Wt Readings from Last 3 Encounters:  11/21/17 158 lb 9.6 oz (71.9 kg)  05/28/17 155 lb 9.6 oz (70.6 kg)  05/23/17 158 lb 3.2 oz (71.8 kg)     Lab Results  Component Value Date   WBC 8.4 05/23/2017   HGB 13.2 05/23/2017   HCT 39.6 05/23/2017   PLT 307.0 05/23/2017   GLUCOSE 61 (L) 05/23/2017   CHOL 151 05/23/2017   TRIG 280.0 (H) 05/23/2017   HDL 46.30 05/23/2017   LDLDIRECT 74.0 05/23/2017   LDLCALC 74 02/15/2017   ALT 15 05/23/2017   AST 19 05/23/2017   NA 139 05/23/2017   K 3.9 05/23/2017   CL 106 05/23/2017   CREATININE 0.61 05/23/2017   BUN 15 05/23/2017   CO2 25 05/23/2017   TSH 1.80 05/23/2017   HGBA1C 7.1 (H) 05/23/2017   MICROALBUR 1.6 11/03/2015    Lab Results  Component Value Date   TSH  1.80 05/23/2017   Lab Results  Component Value Date   WBC 8.4 05/23/2017   HGB 13.2 05/23/2017   HCT 39.6 05/23/2017   MCV 89.6 05/23/2017   PLT 307.0 05/23/2017   Lab Results  Component Value Date   NA 139 05/23/2017   K 3.9 05/23/2017   CO2 25 05/23/2017   GLUCOSE 61 (L) 05/23/2017   BUN 15 05/23/2017   CREATININE 0.61 05/23/2017   BILITOT 0.4 05/23/2017   ALKPHOS 54 05/23/2017   AST 19 05/23/2017   ALT 15 05/23/2017   PROT 7.1 05/23/2017   ALBUMIN 4.2 05/23/2017   CALCIUM 9.5 05/23/2017   GFR 101.38 05/23/2017   Lab Results  Component Value Date   CHOL 151 05/23/2017   Lab Results  Component Value Date   HDL 46.30 05/23/2017   Lab Results  Component Value Date   LDLCALC 74 02/15/2017   Lab Results  Component Value Date   TRIG 280.0 (H) 05/23/2017   Lab Results  Component Value Date   CHOLHDL 3 05/23/2017   Lab Results  Component Value Date   HGBA1C 7.1 (H) 05/23/2017       Assessment & Plan:   Problem List Items Addressed This Visit    Diabetes mellitus type 2 in obese ([place])    hgba1c acceptable, minimize simple carbs. Increase exercise as tolerated. Continue current meds      Relevant Orders   Hemoglobin A1c   Comprehensive metabolic panel   TSH   Hyperlipidemia, mixed    Encouraged heart healthy diet, increase exercise, avoid trans fats, consider a krill oil cap daily      Relevant Orders   Lipid panel   Preventative health care    Patient encouraged to maintain heart healthy diet, regular exercise, adequate sleep. Consider daily probiotics. Take medications as  prescribed      Constipation    Encouraged increased hydration and fiber in diet. Daily probiotics. If bowels not moving can use MOM 2 tbls po in 4 oz of warm prune juice by mouth every 2-3 days. If no results then repeat in 4 hours with  Dulcolax suppository pr, may repeat again in 4 more hours as needed. Seek care if symptoms worsen. Consider daily Miralax and/or Dulcolax if  symptoms persist. Benefiber and/or Miralax prn. Encouraged to proceed with a final colonoscopy.       Relevant Orders   CBC   TSH   Osteopenia    Encouraged to get adequate exercise, calcium and vitamin d intake. Dexa scan      Hx of colonic polyp    Adenoma in 2011 encouraged repeat colonoscopy       Other Visit Diagnoses    Encounter for Medicare annual wellness exam    -  Primary   Postmenopausal       Relevant Orders   DG Bone Density   Breast cancer screening       Relevant Orders   MM DIGITAL SCREENING BILATERAL      I am having Gail Lester start on carisoprodol. I am also having her maintain her aspirin, Cholecalciferol, Lutein, b complex vitamins, multivitamin, MAGNESIUM PO, CALCIUM PO, vitamin C, Probiotic Product (PROBIOTIC DAILY PO), CoQ10, Turmeric, ACCU-CHEK AVIVA PLUS, ACCU-CHEK AVIVA PLUS, Krill Oil, rosuvastatin, losartan, metFORMIN, and glimepiride.  Meds ordered this encounter  Medications  . carisoprodol (SOMA) 350 MG tablet    Sig: Take 1 tablet (350 mg total) by mouth 2 (two) times daily as needed for muscle spasms.    Dispense:  20 tablet    Refill:  1     Penni Homans, MD

## 2017-11-21 NOTE — Patient Instructions (Addendum)
Follow up with Dr.Blyth today as scheduled.  Continue to eat heart healthy diet (full of fruits, vegetables, whole grains, lean protein, water--limit salt, fat, and sugar intake) and increase physical activity as tolerated.  Continue doing brain stimulating activities (puzzles, reading, adult coloring books, staying active) to keep memory sharp.   I have ordered your mammogram and bone density scan.   Encouraged increased hydration and fiber in diet. Daily probiotics. If bowels not moving can use MOM 2 tbls po in 4 oz of warm prune juice by mouth every 2-3 days. If no results then repeat in 4 hours with  Dulcolax suppository pr, may repeat again in 4 more hours as needed. Seek care if symptoms worsen. Consider daily Miralax and/or Dulcolax if symptoms persist. Benefiber and/or Miralax prn.  Benefiber 64 oz of clear fluids   [name] , Thank you for taking time to come for your Medicare Wellness Visit. I appreciate your ongoing commitment to your health goals. Please review the following plan we discussed and let me know if I can assist you in the future.   These are the goals we discussed: Goals    . Increase physical activity (pt-stated)    . Travel across country with your new dog Gail Lester (pt-stated)       This is a list of the screening recommended for you and due dates:  Health Maintenance  Topic Date Due  . Complete foot exam   01/29/2017  . Hemoglobin A1C  11/22/2017  . Tetanus Vaccine  01/12/2018  . Eye exam for diabetics  09/17/2018  . Flu Shot  Completed  . DEXA scan (bone density measurement)  Completed  . Pneumonia vaccines  Completed    Health Maintenance for Postmenopausal Women Menopause is a normal process in which your reproductive ability comes to an end. This process happens gradually over a span of months to years, usually between the ages of 19 and 47. Menopause is complete when you have missed 12 consecutive menstrual periods. It is important to talk with  your health care provider about some of the most common conditions that affect postmenopausal women, such as heart disease, cancer, and bone loss (osteoporosis). Adopting a healthy lifestyle and getting preventive care can help to promote your health and wellness. Those actions can also lower your chances of developing some of these common conditions. What should I know about menopause? During menopause, you may experience a number of symptoms, such as:  Moderate-to-severe hot flashes.  Night sweats.  Decrease in sex drive.  Mood swings.  Headaches.  Tiredness.  Irritability.  Memory problems.  Insomnia.  Choosing to treat or not to treat menopausal changes is an individual decision that you make with your health care provider. What should I know about hormone replacement therapy and supplements? Hormone therapy products are effective for treating symptoms that are associated with menopause, such as hot flashes and night sweats. Hormone replacement carries certain risks, especially as you become older. If you are thinking about using estrogen or estrogen with progestin treatments, discuss the benefits and risks with your health care provider. What should I know about heart disease and stroke? Heart disease, heart attack, and stroke become more likely as you age. This may be due, in part, to the hormonal changes that your body experiences during menopause. These can affect how your body processes dietary fats, triglycerides, and cholesterol. Heart attack and stroke are both medical emergencies. There are many things that you can do to help prevent heart  disease and stroke:  Have your blood pressure checked at least every 1-2 years. High blood pressure causes heart disease and increases the risk of stroke.  If you are 69-41 years old, ask your health care provider if you should take aspirin to prevent a heart attack or a stroke.  Do not use any tobacco products, including cigarettes,  chewing tobacco, or electronic cigarettes. If you need help quitting, ask your health care provider.  It is important to eat a healthy diet and maintain a healthy weight. ? Be sure to include plenty of vegetables, fruits, low-fat dairy products, and lean protein. ? Avoid eating foods that are high in solid fats, added sugars, or salt (sodium).  Get regular exercise. This is one of the most important things that you can do for your health. ? Try to exercise for at least 150 minutes each week. The type of exercise that you do should increase your heart rate and make you sweat. This is known as moderate-intensity exercise. ? Try to do strengthening exercises at least twice each week. Do these in addition to the moderate-intensity exercise.  Know your numbers.Ask your health care provider to check your cholesterol and your blood glucose. Continue to have your blood tested as directed by your health care provider.  What should I know about cancer screening? There are several types of cancer. Take the following steps to reduce your risk and to catch any cancer development as early as possible. Breast Cancer  Practice breast self-awareness. ? This means understanding how your breasts normally appear and feel. ? It also means doing regular breast self-exams. Let your health care provider know about any changes, no matter how small.  If you are 19 or older, have a clinician do a breast exam (clinical breast exam or CBE) every year. Depending on your age, family history, and medical history, it may be recommended that you also have a yearly breast X-ray (mammogram).  If you have a family history of breast cancer, talk with your health care provider about genetic screening.  If you are at high risk for breast cancer, talk with your health care provider about having an MRI and a mammogram every year.  Breast cancer (BRCA) gene test is recommended for women who have family members with BRCA-related  cancers. Results of the assessment will determine the need for genetic counseling and BRCA1 and for BRCA2 testing. BRCA-related cancers include these types: ? Breast. This occurs in males or females. ? Ovarian. ? Tubal. This may also be called fallopian tube cancer. ? Cancer of the abdominal or pelvic lining (peritoneal cancer). ? Prostate. ? Pancreatic.  Cervical, Uterine, and Ovarian Cancer Your health care provider may recommend that you be screened regularly for cancer of the pelvic organs. These include your ovaries, uterus, and vagina. This screening involves a pelvic exam, which includes checking for microscopic changes to the surface of your cervix (Pap test).  For women ages 21-65, health care providers may recommend a pelvic exam and a Pap test every three years. For women ages 64-65, they may recommend the Pap test and pelvic exam, combined with testing for human papilloma virus (HPV), every five years. Some types of HPV increase your risk of cervical cancer. Testing for HPV may also be done on women of any age who have unclear Pap test results.  Other health care providers may not recommend any screening for nonpregnant women who are considered low risk for pelvic cancer and have no symptoms. Ask your  health care provider if a screening pelvic exam is right for you.  If you have had past treatment for cervical cancer or a condition that could lead to cancer, you need Pap tests and screening for cancer for at least 20 years after your treatment. If Pap tests have been discontinued for you, your risk factors (such as having a new sexual partner) need to be reassessed to determine if you should start having screenings again. Some women have medical problems that increase the chance of getting cervical cancer. In these cases, your health care provider may recommend that you have screening and Pap tests more often.  If you have a family history of uterine cancer or ovarian cancer, talk with  your health care provider about genetic screening.  If you have vaginal bleeding after reaching menopause, tell your health care provider.  There are currently no reliable tests available to screen for ovarian cancer.  Lung Cancer Lung cancer screening is recommended for adults 66-46 years old who are at high risk for lung cancer because of a history of smoking. A yearly low-dose CT scan of the lungs is recommended if you:  Currently smoke.  Have a history of at least 30 pack-years of smoking and you currently smoke or have quit within the past 15 years. A pack-year is smoking an average of one pack of cigarettes per day for one year.  Yearly screening should:  Continue until it has been 15 years since you quit.  Stop if you develop a health problem that would prevent you from having lung cancer treatment.  Colorectal Cancer  This type of cancer can be detected and can often be prevented.  Routine colorectal cancer screening usually begins at age 9 and continues through age 86.  If you have risk factors for colon cancer, your health care provider may recommend that you be screened at an earlier age.  If you have a family history of colorectal cancer, talk with your health care provider about genetic screening.  Your health care provider may also recommend using home test kits to check for hidden blood in your stool.  A small camera at the end of a tube can be used to examine your colon directly (sigmoidoscopy or colonoscopy). This is done to check for the earliest forms of colorectal cancer.  Direct examination of the colon should be repeated every 5-10 years until age 62. However, if early forms of precancerous polyps or small growths are found or if you have a family history or genetic risk for colorectal cancer, you may need to be screened more often.  Skin Cancer  Check your skin from head to toe regularly.  Monitor any moles. Be sure to tell your health care  provider: ? About any new moles or changes in moles, especially if there is a change in a mole's shape or color. ? If you have a mole that is larger than the size of a pencil eraser.  If any of your family members has a history of skin cancer, especially at a young age, talk with your health care provider about genetic screening.  Always use sunscreen. Apply sunscreen liberally and repeatedly throughout the day.  Whenever you are outside, protect yourself by wearing long sleeves, pants, a wide-brimmed hat, and sunglasses.  What should I know about osteoporosis? Osteoporosis is a condition in which bone destruction happens more quickly than new bone creation. After menopause, you may be at an increased risk for osteoporosis. To help prevent osteoporosis  or the bone fractures that can happen because of osteoporosis, the following is recommended:  If you are 33-48 years old, get at least 1,000 mg of calcium and at least 600 mg of vitamin D per day.  If you are older than age 75 but younger than age 73, get at least 1,200 mg of calcium and at least 600 mg of vitamin D per day.  If you are older than age 49, get at least 1,200 mg of calcium and at least 800 mg of vitamin D per day.  Smoking and excessive alcohol intake increase the risk of osteoporosis. Eat foods that are rich in calcium and vitamin D, and do weight-bearing exercises several times each week as directed by your health care provider. What should I know about how menopause affects my mental health? Depression may occur at any age, but it is more common as you become older. Common symptoms of depression include:  Low or sad mood.  Changes in sleep patterns.  Changes in appetite or eating patterns.  Feeling an overall lack of motivation or enjoyment of activities that you previously enjoyed.  Frequent crying spells.  Talk with your health care provider if you think that you are experiencing depression. What should I know  about immunizations? It is important that you get and maintain your immunizations. These include:  Tetanus, diphtheria, and pertussis (Tdap) booster vaccine.  Influenza every year before the flu season begins.  Pneumonia vaccine.  Shingles vaccine.  Your health care provider may also recommend other immunizations. This information is not intended to replace advice given to you by your health care provider. Make sure you discuss any questions you have with your health care provider. Document Released: 02/01/2006 Document Revised: 06/29/2016 Document Reviewed: 09/13/2015 Elsevier Interactive Patient Education  2018 [employer].

## 2017-11-21 NOTE — Assessment & Plan Note (Signed)
hgba1c acceptable, minimize simple carbs. Increase exercise as tolerated. Continue current meds 

## 2017-11-21 NOTE — Assessment & Plan Note (Signed)
Encouraged heart healthy diet, increase exercise, avoid trans fats, consider a krill oil cap daily 

## 2017-11-25 DIAGNOSIS — L720 Epidermal cyst: Secondary | ICD-10-CM | POA: Diagnosis not present

## 2017-11-25 DIAGNOSIS — D2239 Melanocytic nevi of other parts of face: Secondary | ICD-10-CM | POA: Diagnosis not present

## 2017-11-25 DIAGNOSIS — L918 Other hypertrophic disorders of the skin: Secondary | ICD-10-CM | POA: Diagnosis not present

## 2017-11-25 DIAGNOSIS — L821 Other seborrheic keratosis: Secondary | ICD-10-CM | POA: Diagnosis not present

## 2017-12-25 DIAGNOSIS — H527 Unspecified disorder of refraction: Secondary | ICD-10-CM | POA: Diagnosis not present

## 2017-12-29 ENCOUNTER — Other Ambulatory Visit: Payer: Self-pay | Admitting: Family Medicine

## 2017-12-29 DIAGNOSIS — E1169 Type 2 diabetes mellitus with other specified complication: Secondary | ICD-10-CM

## 2017-12-29 DIAGNOSIS — E669 Obesity, unspecified: Principal | ICD-10-CM

## 2018-02-01 ENCOUNTER — Other Ambulatory Visit: Payer: Self-pay | Admitting: Family Medicine

## 2018-03-18 DIAGNOSIS — D225 Melanocytic nevi of trunk: Secondary | ICD-10-CM | POA: Diagnosis not present

## 2018-03-18 DIAGNOSIS — L821 Other seborrheic keratosis: Secondary | ICD-10-CM | POA: Diagnosis not present

## 2018-03-18 DIAGNOSIS — L814 Other melanin hyperpigmentation: Secondary | ICD-10-CM | POA: Diagnosis not present

## 2018-03-18 DIAGNOSIS — L720 Epidermal cyst: Secondary | ICD-10-CM | POA: Diagnosis not present

## 2018-03-18 DIAGNOSIS — D2271 Melanocytic nevi of right lower limb, including hip: Secondary | ICD-10-CM | POA: Diagnosis not present

## 2018-03-22 ENCOUNTER — Other Ambulatory Visit: Payer: Self-pay | Admitting: Family Medicine

## 2018-03-24 ENCOUNTER — Other Ambulatory Visit: Payer: Self-pay | Admitting: Family Medicine

## 2018-04-02 DIAGNOSIS — L720 Epidermal cyst: Secondary | ICD-10-CM | POA: Diagnosis not present

## 2018-04-02 DIAGNOSIS — D485 Neoplasm of uncertain behavior of skin: Secondary | ICD-10-CM | POA: Diagnosis not present

## 2018-04-24 ENCOUNTER — Encounter: Payer: Self-pay | Admitting: Family Medicine

## 2018-04-24 ENCOUNTER — Ambulatory Visit (INDEPENDENT_AMBULATORY_CARE_PROVIDER_SITE_OTHER): Payer: Medicare Other | Admitting: Family Medicine

## 2018-04-24 ENCOUNTER — Encounter

## 2018-04-24 DIAGNOSIS — E1169 Type 2 diabetes mellitus with other specified complication: Secondary | ICD-10-CM | POA: Diagnosis not present

## 2018-04-24 DIAGNOSIS — M25552 Pain in left hip: Secondary | ICD-10-CM

## 2018-04-24 DIAGNOSIS — E669 Obesity, unspecified: Secondary | ICD-10-CM | POA: Diagnosis not present

## 2018-04-24 DIAGNOSIS — E782 Mixed hyperlipidemia: Secondary | ICD-10-CM

## 2018-04-24 DIAGNOSIS — Z0000 Encounter for general adult medical examination without abnormal findings: Secondary | ICD-10-CM | POA: Diagnosis not present

## 2018-04-24 DIAGNOSIS — G3184 Mild cognitive impairment, so stated: Secondary | ICD-10-CM | POA: Diagnosis not present

## 2018-04-24 DIAGNOSIS — M8580 Other specified disorders of bone density and structure, unspecified site: Secondary | ICD-10-CM | POA: Diagnosis not present

## 2018-04-24 DIAGNOSIS — R252 Cramp and spasm: Secondary | ICD-10-CM

## 2018-04-24 HISTORY — DX: Cramp and spasm: R25.2

## 2018-04-24 NOTE — Assessment & Plan Note (Signed)
Check magnesium, hydrate, Hyland's leg cramp

## 2018-04-24 NOTE — Patient Instructions (Addendum)
Hyland's leg cramp medicine for  Hydrate 64 oz clear fluids  Recommend calcium intake of 1200 to 1500 mg daily, divided into roughly 3 doses. Best source is the diet and a single dairy serving is about 500 mg, a supplement of calcium citrate once or twice daily to balance diet is fine if not getting enough in diet. Also need Vitamin D 2000 IU caps, 1 cap daily if not already taking vitamin D. Also recommend weight baring exercise on hips and upper body to keep bones strong  LEARN SOMETHING NEW  Shingrix is the new shingles shot 2 shots over 2-6 months at pharmacy   Dementia Dementia means losing some of your brain ability. People with dementia may have problems with:  Memory.  Making decisions.  Behavior.  Speaking.  Thinking.  Solving problems.  Follow these instructions at home: Medicine  Take over-the-counter and prescription medicines only as told by your doctor.  Avoid taking medicines that can change how you think. These include pain or sleeping medicines. Lifestyle   Make healthy choices: ? Be active as told by your doctor. ? Do not use any tobacco products, such as cigarettes, chewing tobacco, and e-cigarettes. If you need help quitting, ask your doctor. ? Eat a healthy diet. ? When you get stressed, do something to help yourself relax. Your doctor can give you tips. ? Spend time with other people.  Drink enough fluid to keep your pee (urine) clear or pale yellow.  Make sure you get good sleep. Use these tips to help you get a good night's rest: ? Try not to take naps during the day. ? Keep your sleeping area dark and cool. ? In the few hours before you go to bed, try not to do any exercise. ? Try not to have foods and drinks with caffeine in the evening. General instructions  Talk with your doctor to figure out: ? What you need help with. ? What your safety needs are.  If you were given a bracelet that tracks your location, make sure to wear it.  Keep  all follow-up visits as told by your doctor. This is important. Contact a doctor if:  You have any new problems.  You have problems with choking or swallowing.  You have any symptoms of a different sickness. Get help right away if:  You have a fever.  You feel mixed up (confused) or more mixed up than before.  You have new sleepiness.  You have sleepiness that gets worse.  You have a hard time staying awake.  You or your family members are worried for your safety. This information is not intended to replace advice given to you by your health care provider. Make sure you discuss any questions you have with your health care provider. Document Released: 11/22/2008 Document Revised: 05/17/2016 Document Reviewed: 09/07/2015 Elsevier Interactive Patient Education  [employer].

## 2018-04-24 NOTE — Assessment & Plan Note (Signed)
Encouraged heart healthy diet, increase exercise, avoid trans fats, consider a krill oil cap daily 

## 2018-04-24 NOTE — Assessment & Plan Note (Addendum)
hgba1c acceptable, minimize simple carbs. Increase exercise as tolerated. Continue current meds. Doing well with Metformin 500 mg po bid

## 2018-04-24 NOTE — Progress Notes (Signed)
Subjective:  I acted as a Education administrator for [employer]. Gail Lester, [name].    Patient ID: Gail Lester, female    DOB: [birth year], 77 y.o.   MRN: [id number]  Chief Complaint  Patient presents with  . Follow-up    on neck    HPI  Patient is in today for follow up visit an overall she is doing well. She tries to stay active and maintain a heart healthy diet. No polyuria or polydipsia. Denies CP/palp/SOB/HA/congestion/fevers/GI or GU c/o. Taking meds as prescribed. Her greatest concern is some mild memory loss. She is not having any trouble with necessary activities, getting lost etc. Just more trouble with names, remembering some simple tasks.   Patient Care Team: Mosie Lukes, MD as PCP - General (Family Medicine) Sharyne Peach, MD as Consulting Physician (Ophthalmology) Martinique, Amy, MD as Consulting Physician (Dermatology)   Past Medical History:  Diagnosis Date  . Arthritis   . Chicken pox as a child  . Constipation 02/15/2017  . Cough 03/24/2015  . Diabetes ([place])   . Diabetes mellitus   . Diabetes mellitus type 2 in obese [facility]) 02/10/2013   Saw Dr Delman Cheadle, eye doctor in 01/01/13, exam in chart   . Diarrhea 02/10/2013  . Dupuytren's contracture of both hands 02/10/2013  . Encounter for Medicare annual wellness exam 08/04/2013  . Fibrocystic breast 02/10/2013   Left h/o FN biopsy   . Ganglion cyst 11/14/2014   Right 2nd finger  . Hypercholesteremia   . Keratosis, seborrheic 08/01/2015  . Knee pain, left 02/15/2017  . Left arm pain 11/14/2014  . Measles as a child  . Medicare annual wellness visit, subsequent 11/06/2015  . 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 Mumps as a child  . Osteopenia 05/23/2017  . Other and unspecified hyperlipidemia 02/10/2013  . Other malaise and fatigue 08/04/2013  . Overweight 06/14/2014  . Preventative health care 11/09/2016    Past Surgical History:  Procedure Laterality Date  . APPENDECTOMY  1955  . BREAST BIOPSY  1985-1990   benign  . ORIF WRIST  FRACTURE  12/09/2011   Procedure: OPEN REDUCTION INTERNAL FIXATION (ORIF) WRIST FRACTURE;  Surgeon: Linna Hoff;  Location: [place];  Service: Orthopedics;  Laterality: Left;    Family History  Problem Relation Age of Onset  . Kidney disease Mother   . Diabetes Father   . Kidney disease Maternal Grandmother   . Cancer Maternal Grandmother        bladder  . Cancer Maternal Aunt   . Alzheimer's disease Maternal Aunt   . Scoliosis Son   . Colon cancer Unknown        grandparent    Social History   Socioeconomic History  . Marital status: Divorced    Spouse name: Not on file  . Number of children: Not on file  . Years of education: Not on file  . Highest education level: Not on file  Occupational History  . Not on file  Social Needs  . Financial resource strain: Not on file  . Food insecurity:    Worry: Not on file    Inability: Not on file  . Transportation needs:    Medical: Not on file    Non-medical: Not on file  Tobacco Use  . Smoking status: Former Smoker    Last attempt to quit: 11/17/1989    Years since quitting: 28.4  . Smokeless tobacco: Former Network engineer and Sexual Activity  . Alcohol use: No  .  Drug use: No  . Sexual activity: Yes  Lifestyle  . Physical activity:    Days per week: Not on file    Minutes per session: Not on file  . Stress: Not on file  Relationships  . Social connections:    Talks on phone: Not on file    Gets together: Not on file    Attends religious service: Not on file    Active member of club or organization: Not on file    Attends meetings of clubs or organizations: Not on file    Relationship status: Not on file  . Intimate partner violence:    Fear of current or ex partner: Not on file    Emotionally abused: Not on file    Physically abused: Not on file    Forced sexual activity: Not on file  Other Topics Concern  . Not on file  Social History Narrative  . Not on file    Outpatient Medications Prior to Visit    Medication Sig Dispense Refill  . ACCU-CHEK AVIVA PLUS test strip CHECK BLOOD SUGAR 4 TIMES  DAILY AND AS NEEDED FOR  LABILE BLOOD SUGARS 450 each 0  . aspirin 81 MG tablet Take 81 mg by mouth daily.    Marland Kitchen b complex vitamins tablet Take 1 tablet by mouth as directed. Two times a week    . Blood Glucose Monitoring Suppl (ACCU-CHEK AVIVA PLUS) W/DEVICE KIT Use as directed 1 kit 0  . CALCIUM PO Take 400 mg by mouth daily.    . carisoprodol (SOMA) 350 MG tablet Take 1 tablet (350 mg total) by mouth 2 (two) times daily as needed for muscle spasms. 20 tablet 1  . Cholecalciferol (D-3-5) 5000 UNITS capsule Take 4,000 Units by mouth daily.     . Coenzyme Q10 (COQ10) 100 MG CAPS Take 1 capsule by mouth daily.    Marland Kitchen glimepiride (AMARYL) 4 MG tablet TAKE 1 TABLET BY MOUTH TWO  TIMES DAILY 180 tablet 0  . Krill Oil 500 MG CAPS Take 1 capsule by mouth daily.    Marland Kitchen losartan (COZAAR) 25 MG tablet TAKE 1 TABLET BY MOUTH  DAILY 90 tablet 1  . Lutein 20 MG CAPS Take by mouth.    Marland Kitchen MAGNESIUM PO Take 250 mg by mouth daily.     . metFORMIN (GLUCOPHAGE-XR) 500 MG 24 hr tablet TAKE 1 TABLET BY MOUTH 3  TIMES DAILY AS NEEDED 270 tablet 1  . Multiple Vitamin (MULTIVITAMIN) tablet Take 1 tablet by mouth as directed. Two times a week    . Probiotic Product (PROBIOTIC DAILY PO) Take 1 capsule by mouth daily.    . rosuvastatin (CRESTOR) 20 MG tablet TAKE ONE-HALF TABLET BY  MOUTH EVERY DAY 45 tablet 3  . Turmeric 500 MG CAPS Take by mouth daily.    . vitamin C (ASCORBIC ACID) 500 MG tablet Take 500 mg by mouth 2 (two) times daily.     No facility-administered medications prior to visit.     Allergies  Allergen Reactions  . Ramipril Diarrhea  . Lipitor [Atorvastatin]     Diarrhea, muscle cramps  . Lisinopril     myalgias    Review of Systems  Constitutional: Negative for fever and malaise/fatigue.  HENT: Negative for congestion.   Eyes: Negative for blurred vision.  Respiratory: Negative for shortness of  breath.   Cardiovascular: Negative for chest pain, palpitations and leg swelling.  Gastrointestinal: Negative for abdominal pain, blood in stool and nausea.  Genitourinary: Negative for dysuria and frequency.  Musculoskeletal: Negative for falls.  Skin: Negative for rash.  Neurological: Negative for dizziness, loss of consciousness and headaches.  Endo/Heme/Allergies: Negative for environmental allergies.  Psychiatric/Behavioral: Negative for depression and memory loss. The patient is not nervous/anxious.        Objective:    Physical Exam  Constitutional: She is oriented to person, place, and time. She appears well-developed and well-nourished. No distress.  HENT:  Head: Normocephalic and atraumatic.  Nose: Nose normal.  Eyes: Right eye exhibits no discharge. Left eye exhibits no discharge.  Neck: Normal range of motion. Neck supple.  Cardiovascular: Normal rate and regular rhythm.  No murmur heard. Pulmonary/Chest: Effort normal and breath sounds normal.  Abdominal: Soft. Bowel sounds are normal. There is no tenderness.  Musculoskeletal: She exhibits no edema.  Neurological: She is alert and oriented to person, place, and time.  Skin: Skin is warm and dry.  Psychiatric: She has a normal mood and affect.  Nursing note and vitals reviewed.   BP (!) 130/58   Pulse 66   Temp 98.3 F (36.8 C) (Oral)   Resp 16   Ht 5' 4.17" (1.63 m)   Wt 160 lb 6.4 oz (72.8 kg)   SpO2 97%   BMI 27.38 kg/m  Wt Readings from Last 3 Encounters:  04/24/18 160 lb 6.4 oz (72.8 kg)  11/21/17 158 lb 9.6 oz (71.9 kg)  05/28/17 155 lb 9.6 oz (70.6 kg)   BP Readings from Last 3 Encounters:  04/24/18 (!) 130/58  11/21/17 118/78  05/28/17 122/80     Immunization History  Administered Date(s) Administered  . Influenza, High Dose Seasonal PF 09/11/2016, 10/03/2017  . Influenza,inj,Quad PF,6+ Mos 09/02/2013, 08/31/2014, 09/21/2015  . Pneumococcal Conjugate-13 02/04/2014  . Pneumococcal  Polysaccharide-23 01/12/2007  . Tdap 01/13/2008  . Zoster 01/12/2007    Health Maintenance  Topic Date Due  . FOOT EXAM  01/29/2017  . TETANUS/TDAP  04/25/2019 (Originally 01/12/2018)  . INFLUENZA VACCINE  07/24/2018  . OPHTHALMOLOGY EXAM  09/17/2018  . HEMOGLOBIN A1C  10/25/2018  . DEXA SCAN  Completed  . PNA vac Low Risk Adult  Completed    Lab Results  Component Value Date   WBC 6.9 04/24/2018   HGB 12.7 04/24/2018   HCT 38.2 04/24/2018   PLT 279.0 04/24/2018   GLUCOSE 162 (H) 04/25/2018   CHOL 143 04/24/2018   TRIG 200.0 (H) 04/24/2018   HDL 47.40 04/24/2018   LDLDIRECT 74.0 05/23/2017   LDLCALC 56 04/24/2018   ALT 14 04/24/2018   AST 18 04/24/2018   NA 142 04/25/2018   K 4.2 04/25/2018   CL 105 04/25/2018   CREATININE 0.69 04/25/2018   BUN 16 04/25/2018   CO2 27 04/25/2018   TSH 1.82 04/24/2018   HGBA1C 7.6 (H) 04/24/2018   MICROALBUR 1.6 11/03/2015    Lab Results  Component Value Date   TSH 1.82 04/24/2018   Lab Results  Component Value Date   WBC 6.9 04/24/2018   HGB 12.7 04/24/2018   HCT 38.2 04/24/2018   MCV 90.2 04/24/2018   PLT 279.0 04/24/2018   Lab Results  Component Value Date   NA 142 04/25/2018   K 4.2 04/25/2018   CO2 27 04/25/2018   GLUCOSE 162 (H) 04/25/2018   BUN 16 04/25/2018   CREATININE 0.69 04/25/2018   BILITOT 0.3 04/24/2018   ALKPHOS 46 04/24/2018   AST 18 04/24/2018   ALT 14 04/24/2018   PROT 6.8 04/24/2018  ALBUMIN 4.1 04/24/2018   CALCIUM 9.9 04/25/2018   GFR 89.22 04/24/2018   Lab Results  Component Value Date   CHOL 143 04/24/2018   Lab Results  Component Value Date   HDL 47.40 04/24/2018   Lab Results  Component Value Date   LDLCALC 56 04/24/2018   Lab Results  Component Value Date   TRIG 200.0 (H) 04/24/2018   Lab Results  Component Value Date   CHOLHDL 3 04/24/2018   Lab Results  Component Value Date   HGBA1C 7.6 (H) 04/24/2018         Assessment & Plan:   Problem List Items Addressed  This Visit    Diabetes mellitus type 2 in obese ([place])    hgba1c acceptable, minimize simple carbs. Increase exercise as tolerated. Continue current meds. Doing well with Metformin 500 mg po bid      Relevant Orders   Hemoglobin A1c (Completed)   CBC (Completed)   Comprehensive metabolic panel (Completed)   Hyperlipidemia, mixed    Encouraged heart healthy diet, increase exercise, avoid trans fats, consider a krill oil cap daily      Relevant Orders   CBC (Completed)   Lipid panel (Completed)   Preventative health care    Patient encouraged to maintain heart healthy diet, regular exercise, adequate sleep. Consider daily probiotics. Take medications as prescribed      Osteopenia    Encouraged to get adequate exercise, calcium and vitamin d intake      Muscle cramps    Check magnesium, hydrate, Hyland's leg cramp      Relevant Orders   CBC (Completed)   Comprehensive metabolic panel (Completed)   TSH (Completed)   Magnesium (Completed)   RESOLVED: Hip pain, acute, left    Encouraged moist heat and gentle stretching as tolerated. May try NSAIDs and prescription meds as directed and report if symptoms worsen or seek immediate care. If pain persists will need referral for consideration      Mild cognitive impairment    Offered referred for neurocognitive evaluation, for now she will continue to stay active, eat a MIND diet and learn something new.          I am having Gail Lester maintain her aspirin, Cholecalciferol, Lutein, b complex vitamins, multivitamin, MAGNESIUM PO, CALCIUM PO, vitamin C, Probiotic Product (PROBIOTIC DAILY PO), CoQ10, Turmeric, ACCU-CHEK AVIVA PLUS, Krill Oil, carisoprodol, metFORMIN, losartan, rosuvastatin, glimepiride, and ACCU-CHEK AVIVA PLUS.  No orders of the defined types were placed in this encounter.   CMA served as Education administrator during this visit. History, Physical and Plan performed by medical provider. Documentation and orders reviewed and  attested to.  Penni Homans, MD

## 2018-04-25 ENCOUNTER — Other Ambulatory Visit (INDEPENDENT_AMBULATORY_CARE_PROVIDER_SITE_OTHER): Payer: Medicare Other

## 2018-04-25 ENCOUNTER — Telehealth: Payer: Self-pay

## 2018-04-25 ENCOUNTER — Other Ambulatory Visit: Payer: Self-pay

## 2018-04-25 DIAGNOSIS — E875 Hyperkalemia: Secondary | ICD-10-CM

## 2018-04-25 LAB — BASIC METABOLIC PANEL
BUN: 16 mg/dL (ref 7–25)
CALCIUM: 9.9 mg/dL (ref 8.6–10.4)
CHLORIDE: 105 mmol/L (ref 98–110)
CO2: 27 mmol/L (ref 20–32)
Creat: 0.69 mg/dL (ref 0.60–0.93)
Glucose, Bld: 162 mg/dL — ABNORMAL HIGH (ref 65–99)
POTASSIUM: 4.2 mmol/L (ref 3.5–5.3)
Sodium: 142 mmol/L (ref 135–146)

## 2018-04-25 LAB — COMPREHENSIVE METABOLIC PANEL
ALBUMIN: 4.1 g/dL (ref 3.5–5.2)
ALK PHOS: 46 U/L (ref 39–117)
ALT: 14 U/L (ref 0–35)
AST: 18 U/L (ref 0–37)
BUN: 16 mg/dL (ref 6–23)
CO2: 26 mEq/L (ref 19–32)
CREATININE: 0.68 mg/dL (ref 0.40–1.20)
Calcium: 9.5 mg/dL (ref 8.4–10.5)
Chloride: 105 mEq/L (ref 96–112)
GFR: 89.22 mL/min (ref >60.00)
GLUCOSE: 174 mg/dL — AB (ref 70–99)
POTASSIUM: 6.6 meq/L — AB (ref 3.5–5.1)
SODIUM: 139 meq/L (ref 135–145)
TOTAL PROTEIN: 6.8 g/dL (ref 6.0–8.3)
Total Bilirubin: 0.3 mg/dL (ref 0.2–1.2)

## 2018-04-25 LAB — LIPID PANEL
Cholesterol: 143 mg/dL (ref 0–200)
HDL: 47.4 mg/dL (ref >39.00)
LDL CALC: 56 mg/dL (ref 0–99)
NONHDL: 96.05
Total CHOL/HDL Ratio: 3
Triglycerides: 200 mg/dL — ABNORMAL HIGH (ref 0.0–149.0)
VLDL: 40 mg/dL (ref 0.0–40.0)

## 2018-04-25 LAB — CBC
HEMATOCRIT: 38.2 % (ref 36.0–46.0)
Hemoglobin: 12.7 g/dL (ref 12.0–15.0)
MCHC: 33.3 g/dL (ref 30.0–36.0)
MCV: 90.2 fl (ref 78.0–100.0)
Platelets: 279 10*3/uL (ref 150.0–400.0)
RBC: 4.24 Mil/uL (ref 3.87–5.11)
RDW: 13.2 % (ref 11.5–15.5)
WBC: 6.9 10*3/uL (ref 4.0–10.5)

## 2018-04-25 LAB — HEMOGLOBIN A1C: Hgb A1c MFr Bld: 7.6 % — ABNORMAL HIGH (ref 4.6–6.5)

## 2018-04-25 LAB — TSH: TSH: 1.82 u[IU]/mL (ref 0.35–4.50)

## 2018-04-25 LAB — MAGNESIUM: MAGNESIUM: 1.9 mg/dL (ref 1.5–2.5)

## 2018-04-25 NOTE — Telephone Encounter (Signed)
We received a critical lab value call earlier to let us know patient's potasium level came back abnormal at 6.6. [place] Per Dr. Charlett Blake patient was contacted over the phone to come back in for repeat labs bmp was ordered.  Patient came in and she reports no dramatic changes in her diet that will indicate high consumption of potasium. We advise patient we will contact her back as soon as we get the results. In the mean time she was advised not to consume potasium rich products and to go to the ER if abnormal heart rhythm or palpitations. She voiced understanding. RS CMA

## 2018-04-27 DIAGNOSIS — M25552 Pain in left hip: Secondary | ICD-10-CM | POA: Insufficient documentation

## 2018-04-27 DIAGNOSIS — G3184 Mild cognitive impairment, so stated: Secondary | ICD-10-CM | POA: Insufficient documentation

## 2018-04-27 NOTE — Assessment & Plan Note (Signed)
Encouraged moist heat and gentle stretching as tolerated. May try NSAIDs and prescription meds as directed and report if symptoms worsen or seek immediate care. If pain persists will need referral for consideration

## 2018-04-27 NOTE — Assessment & Plan Note (Signed)
Patient encouraged to maintain heart healthy diet, regular exercise, adequate sleep. Consider daily probiotics. Take medications as prescribed 

## 2018-04-27 NOTE — Assessment & Plan Note (Signed)
Encouraged to get adequate exercise, calcium and vitamin d intake 

## 2018-04-27 NOTE — Assessment & Plan Note (Signed)
Offered referred for neurocognitive evaluation, for now she will continue to stay active, eat a MIND diet and learn something new.

## 2018-05-26 ENCOUNTER — Ambulatory Visit: Payer: Medicare Other | Admitting: Family Medicine

## 2018-08-26 ENCOUNTER — Encounter: Payer: Self-pay | Admitting: Family Medicine

## 2018-08-26 ENCOUNTER — Ambulatory Visit (INDEPENDENT_AMBULATORY_CARE_PROVIDER_SITE_OTHER): Payer: Medicare Other | Admitting: Family Medicine

## 2018-08-26 VITALS — BP 122/72 | HR 71 | Temp 98.1°F | Resp 18 | Wt 163.0 lb

## 2018-08-26 DIAGNOSIS — E669 Obesity, unspecified: Secondary | ICD-10-CM

## 2018-08-26 DIAGNOSIS — H00014 Hordeolum externum left upper eyelid: Secondary | ICD-10-CM | POA: Diagnosis not present

## 2018-08-26 DIAGNOSIS — E1169 Type 2 diabetes mellitus with other specified complication: Secondary | ICD-10-CM

## 2018-08-26 DIAGNOSIS — H00019 Hordeolum externum unspecified eye, unspecified eyelid: Secondary | ICD-10-CM | POA: Insufficient documentation

## 2018-08-26 DIAGNOSIS — R269 Unspecified abnormalities of gait and mobility: Secondary | ICD-10-CM

## 2018-08-26 DIAGNOSIS — E663 Overweight: Secondary | ICD-10-CM

## 2018-08-26 DIAGNOSIS — Z23 Encounter for immunization: Secondary | ICD-10-CM

## 2018-08-26 DIAGNOSIS — M8580 Other specified disorders of bone density and structure, unspecified site: Secondary | ICD-10-CM | POA: Diagnosis not present

## 2018-08-26 DIAGNOSIS — R252 Cramp and spasm: Secondary | ICD-10-CM

## 2018-08-26 DIAGNOSIS — E782 Mixed hyperlipidemia: Secondary | ICD-10-CM | POA: Diagnosis not present

## 2018-08-26 DIAGNOSIS — M720 Palmar fascial fibromatosis [Dupuytren]: Secondary | ICD-10-CM

## 2018-08-26 LAB — CBC
HCT: 40.2 % (ref 36.0–46.0)
Hemoglobin: 13.4 g/dL (ref 12.0–15.0)
MCHC: 33.4 g/dL (ref 30.0–36.0)
MCV: 89.6 fl (ref 78.0–100.0)
PLATELETS: 295 10*3/uL (ref 150.0–400.0)
RBC: 4.49 Mil/uL (ref 3.87–5.11)
RDW: 12.9 % (ref 11.5–15.5)
WBC: 6.8 10*3/uL (ref 4.0–10.5)

## 2018-08-26 LAB — TSH: TSH: 2.13 u[IU]/mL (ref 0.35–4.50)

## 2018-08-26 LAB — COMPREHENSIVE METABOLIC PANEL
ALBUMIN: 4 g/dL (ref 3.5–5.2)
ALK PHOS: 51 U/L (ref 39–117)
ALT: 14 U/L (ref 0–35)
AST: 20 U/L (ref 0–37)
BILIRUBIN TOTAL: 0.4 mg/dL (ref 0.2–1.2)
BUN: 14 mg/dL (ref 6–23)
CALCIUM: 9.3 mg/dL (ref 8.4–10.5)
CO2: 23 mEq/L (ref 19–32)
CREATININE: 0.7 mg/dL (ref 0.40–1.20)
Chloride: 105 mEq/L (ref 96–112)
GFR: 86.21 mL/min (ref >60.00)
Glucose, Bld: 109 mg/dL — ABNORMAL HIGH (ref 70–99)
Potassium: 4.4 mEq/L (ref 3.5–5.1)
SODIUM: 139 meq/L (ref 135–145)
TOTAL PROTEIN: 6.6 g/dL (ref 6.0–8.3)

## 2018-08-26 LAB — LIPID PANEL
CHOLESTEROL: 145 mg/dL (ref 0–200)
HDL: 41.9 mg/dL (ref >39.00)
LDL Cholesterol: 68 mg/dL (ref 0–99)
NonHDL: 103.34
Total CHOL/HDL Ratio: 3
Triglycerides: 175 mg/dL — ABNORMAL HIGH (ref 0.0–149.0)
VLDL: 35 mg/dL (ref 0.0–40.0)

## 2018-08-26 LAB — MAGNESIUM: MAGNESIUM: 1.8 mg/dL (ref 1.5–2.5)

## 2018-08-26 LAB — HEMOGLOBIN A1C: HEMOGLOBIN A1C: 7.6 % — AB (ref 4.6–6.5)

## 2018-08-26 MED ORDER — LOSARTAN POTASSIUM 25 MG PO TABS: 25.0000 mg | ORAL_TABLET | Freq: Every day | ORAL | 1 refills | Status: DC

## 2018-08-26 NOTE — Assessment & Plan Note (Signed)
Encouraged DASH diet, decrease po intake and increase exercise as tolerated. Needs 7-8 hours of sleep nightly. Avoid trans fats, eat small, frequent meals every 4-5 hours with lean proteins, complex carbs and healthy fats. Minimize simple carbs 

## 2018-08-26 NOTE — Assessment & Plan Note (Signed)
hgba1c acceptable, minimize simple carbs. Increase exercise as tolerated. Continue current meds 

## 2018-08-26 NOTE — Assessment & Plan Note (Signed)
Has noted increased arthritic pain and decreased grip strain

## 2018-08-26 NOTE — Assessment & Plan Note (Signed)
Improved with improved hydration and encouraged to try Hyland's leg cramp medicine to use prn

## 2018-08-26 NOTE — Assessment & Plan Note (Signed)
Encouraged heart healthy diet, increase exercise, avoid trans fats, consider a krill oil cap daily 

## 2018-08-26 NOTE — Assessment & Plan Note (Signed)
Notes an increase in some shuffling but denies any falls. She is offered PT but declines for now she will let us know if worsens

## 2018-08-26 NOTE — Progress Notes (Signed)
Subjective:  I acted as a Education administrator for Dr. Charlett Blake. [place], [place]  Patient ID: Gail Lester, female    DOB: 1941-04-13, 77 y.o.   MRN: [id number]  No chief complaint on file.   HPI  Patient is in today for 4 month follow up and she is feeling well. She has been dealing with a stye in her eyelid and following with opthamology. after treatment it is greatly improved. No recent febrile illness or hospitalizations. She reports no acute concerns. Denies CP/palp/SOB/HA/congestion/fevers/GI or GU c/o. Taking meds as prescribed Patient Care Team: Mosie Lukes, MD as PCP - General (Family Medicine) Sharyne Peach, MD as Consulting Physician (Ophthalmology) Martinique, Amy, MD as Consulting Physician (Dermatology)   Past Medical History:  Diagnosis Date  . Arthritis   . Chicken pox as a child  . Constipation 02/15/2017  . Cough 03/24/2015  . Diabetes ([place])   . Diabetes mellitus   . Diabetes mellitus type 2 in obese [facility]) 02/10/2013   Saw Dr Delman Cheadle, eye doctor in 01/01/13, exam in chart   . Diarrhea 02/10/2013  . Dupuytren's contracture of both hands 02/10/2013  . Encounter for Medicare annual wellness exam 08/04/2013  . Fibrocystic breast 02/10/2013   Left h/o FN biopsy   . Ganglion cyst 11/14/2014   Right 2nd finger  . Hypercholesteremia   . Keratosis, seborrheic 08/01/2015  . Knee pain, left 02/15/2017  . Left arm pain 11/14/2014  . Measles as a child  . Medicare annual wellness visit, subsequent 11/06/2015  . 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 Mumps as a child  . Osteopenia 05/23/2017  . Other and unspecified hyperlipidemia 02/10/2013  . Other malaise and fatigue 08/04/2013  . Overweight 06/14/2014  . Preventative health care 11/09/2016    Past Surgical History:  Procedure Laterality Date  . APPENDECTOMY  1955  . BREAST BIOPSY  1985-1990   benign  . ORIF WRIST FRACTURE  12/09/2011   Procedure: OPEN REDUCTION INTERNAL FIXATION (ORIF) WRIST FRACTURE;  Surgeon: Linna Hoff;   Location: [place];  Service: Orthopedics;  Laterality: Left;    Family History  Problem Relation Age of Onset  . Kidney disease Mother   . Diabetes Father   . Kidney disease Maternal Grandmother   . Cancer Maternal Grandmother        bladder  . Cancer Maternal Aunt   . Alzheimer's disease Maternal Aunt   . Scoliosis Son   . Colon cancer Unknown        grandparent    Social History   Socioeconomic History  . Marital status: Divorced    Spouse name: Not on file  . Number of children: Not on file  . Years of education: Not on file  . Highest education level: Not on file  Occupational History  . Not on file  Social Needs  . Financial resource strain: Not on file  . Food insecurity:    Worry: Not on file    Inability: Not on file  . Transportation needs:    Medical: Not on file    Non-medical: Not on file  Tobacco Use  . Smoking status: Former Smoker    Last attempt to quit: 11/17/1989    Years since quitting: 28.8  . Smokeless tobacco: Former Network engineer and Sexual Activity  . Alcohol use: No  . Drug use: No  . Sexual activity: Yes  Lifestyle  . Physical activity:    Days per week: Not on file  Minutes per session: Not on file  . Stress: Not on file  Relationships  . Social connections:    Talks on phone: Not on file    Gets together: Not on file    Attends religious service: Not on file    Active member of club or organization: Not on file    Attends meetings of clubs or organizations: Not on file    Relationship status: Not on file  . Intimate partner violence:    Fear of current or ex partner: Not on file    Emotionally abused: Not on file    Physically abused: Not on file    Forced sexual activity: Not on file  Other Topics Concern  . Not on file  Social History Narrative  . Not on file    Outpatient Medications Prior to Visit  Medication Sig Dispense Refill  . ACCU-CHEK AVIVA PLUS test strip CHECK BLOOD SUGAR 4 TIMES  DAILY AND AS NEEDED FOR   LABILE BLOOD SUGARS 450 each 0  . aspirin 81 MG tablet Take 81 mg by mouth daily.    Marland Kitchen b complex vitamins tablet Take 1 tablet by mouth as directed. Two times a week    . Blood Glucose Monitoring Suppl (ACCU-CHEK AVIVA PLUS) W/DEVICE KIT Use as directed 1 kit 0  . CALCIUM PO Take 400 mg by mouth daily.    . carisoprodol (SOMA) 350 MG tablet Take 1 tablet (350 mg total) by mouth 2 (two) times daily as needed for muscle spasms. 20 tablet 1  . Cholecalciferol (D-3-5) 5000 UNITS capsule Take 4,000 Units by mouth daily.     . Coenzyme Q10 (COQ10) 100 MG CAPS Take 1 capsule by mouth daily.    Marland Kitchen glimepiride (AMARYL) 4 MG tablet TAKE 1 TABLET BY MOUTH TWO  TIMES DAILY 180 tablet 0  . Krill Oil 500 MG CAPS Take 1 capsule by mouth daily.    . Lutein 20 MG CAPS Take by mouth.    Marland Kitchen MAGNESIUM PO Take 250 mg by mouth daily.     . metFORMIN (GLUCOPHAGE-XR) 500 MG 24 hr tablet TAKE 1 TABLET BY MOUTH 3  TIMES DAILY AS NEEDED 270 tablet 1  . Multiple Vitamin (MULTIVITAMIN) tablet Take 1 tablet by mouth as directed. Two times a week    . rosuvastatin (CRESTOR) 20 MG tablet TAKE ONE-HALF TABLET BY  MOUTH EVERY DAY 45 tablet 3  . losartan (COZAAR) 25 MG tablet TAKE 1 TABLET BY MOUTH  DAILY 90 tablet 1  . Probiotic Product (PROBIOTIC DAILY PO) Take 1 capsule by mouth daily.    . Turmeric 500 MG CAPS Take by mouth daily.    . vitamin C (ASCORBIC ACID) 500 MG tablet Take 500 mg by mouth 2 (two) times daily.     No facility-administered medications prior to visit.     Allergies  Allergen Reactions  . Ramipril Diarrhea  . Lipitor [Atorvastatin]     Diarrhea, muscle cramps  . Lisinopril     myalgias    Review of Systems  Constitutional: Negative for fever and malaise/fatigue.  HENT: Negative for congestion.   Eyes: Negative for blurred vision.  Respiratory: Negative for shortness of breath.   Cardiovascular: Negative for chest pain, palpitations and leg swelling.  Gastrointestinal: Negative for abdominal  pain, blood in stool and nausea.  Genitourinary: Negative for dysuria and frequency.  Musculoskeletal: Negative for falls.  Skin: Negative for rash.  Neurological: Negative for dizziness, loss of consciousness and headaches.  Endo/Heme/Allergies: Negative for environmental allergies.  Psychiatric/Behavioral: Negative for depression. The patient is not nervous/anxious.        Objective:    Physical Exam  Constitutional: She is oriented to person, place, and time. She appears well-developed and well-nourished. No distress.  HENT:  Head: Normocephalic and atraumatic.  Nose: Nose normal.  Eyes: Right eye exhibits no discharge. Left eye exhibits no discharge.  Neck: Normal range of motion. Neck supple.  Cardiovascular: Normal rate and regular rhythm.  No murmur heard. Pulmonary/Chest: Effort normal and breath sounds normal.  Abdominal: Soft. Bowel sounds are normal. There is no tenderness.  Musculoskeletal: She exhibits no edema.  Neurological: She is alert and oriented to person, place, and time.  Skin: Skin is warm and dry.  Psychiatric: She has a normal mood and affect.  Nursing note and vitals reviewed.   BP 122/72 (BP Location: Left Arm, Patient Position: Sitting, Cuff Size: Normal)   Pulse 71   Temp 98.1 F (36.7 C) (Oral)   Resp 18   Wt 163 lb (73.9 kg)   SpO2 98%   BMI 27.83 kg/m  Wt Readings from Last 3 Encounters:  08/26/18 163 lb (73.9 kg)  04/24/18 160 lb 6.4 oz (72.8 kg)  11/21/17 158 lb 9.6 oz (71.9 kg)   BP Readings from Last 3 Encounters:  08/26/18 122/72  04/24/18 (!) 130/58  11/21/17 118/78     Immunization History  Administered Date(s) Administered  . Influenza, High Dose Seasonal PF 09/11/2016, 10/03/2017, 08/26/2018  . Influenza,inj,Quad PF,6+ Mos 09/02/2013, 08/31/2014, 09/21/2015  . Pneumococcal Conjugate-13 02/04/2014  . Pneumococcal Polysaccharide-23 01/12/2007  . Tdap 01/13/2008  . Zoster 01/12/2007  . Zoster Recombinat (Shingrix)  08/05/2018    Health Maintenance  Topic Date Due  . FOOT EXAM  01/29/2017  . TETANUS/TDAP  04/25/2019 (Originally 01/12/2018)  . OPHTHALMOLOGY EXAM  09/17/2018  . HEMOGLOBIN A1C  02/24/2019  . INFLUENZA VACCINE  Completed  . DEXA SCAN  Completed  . PNA vac Low Risk Adult  Completed    Lab Results  Component Value Date   WBC 6.8 08/26/2018   HGB 13.4 08/26/2018   HCT 40.2 08/26/2018   PLT 295.0 08/26/2018   GLUCOSE 109 (H) 08/26/2018   CHOL 145 08/26/2018   TRIG 175.0 (H) 08/26/2018   HDL 41.90 08/26/2018   LDLDIRECT 74.0 05/23/2017   LDLCALC 68 08/26/2018   ALT 14 08/26/2018   AST 20 08/26/2018   NA 139 08/26/2018   K 4.4 08/26/2018   CL 105 08/26/2018   CREATININE 0.70 08/26/2018   BUN 14 08/26/2018   CO2 23 08/26/2018   TSH 2.13 08/26/2018   HGBA1C 7.6 (H) 08/26/2018   MICROALBUR 1.6 11/03/2015    Lab Results  Component Value Date   TSH 2.13 08/26/2018   Lab Results  Component Value Date   WBC 6.8 08/26/2018   HGB 13.4 08/26/2018   HCT 40.2 08/26/2018   MCV 89.6 08/26/2018   PLT 295.0 08/26/2018   Lab Results  Component Value Date   NA 139 08/26/2018   K 4.4 08/26/2018   CO2 23 08/26/2018   GLUCOSE 109 (H) 08/26/2018   BUN 14 08/26/2018   CREATININE 0.70 08/26/2018   BILITOT 0.4 08/26/2018   ALKPHOS 51 08/26/2018   AST 20 08/26/2018   ALT 14 08/26/2018   PROT 6.6 08/26/2018   ALBUMIN 4.0 08/26/2018   CALCIUM 9.3 08/26/2018   GFR 86.21 08/26/2018   Lab Results  Component Value Date   CHOL  145 08/26/2018   Lab Results  Component Value Date   HDL 41.90 08/26/2018   Lab Results  Component Value Date   LDLCALC 68 08/26/2018   Lab Results  Component Value Date   TRIG 175.0 (H) 08/26/2018   Lab Results  Component Value Date   CHOLHDL 3 08/26/2018   Lab Results  Component Value Date   HGBA1C 7.6 (H) 08/26/2018         Assessment & Plan:   Problem List Items Addressed This Visit    Diabetes mellitus type 2 in obese ([place])     hgba1c acceptable, minimize simple carbs. Increase exercise as tolerated. Continue current meds      Relevant Medications   losartan (COZAAR) 25 MG tablet   Other Relevant Orders   Hemoglobin A1c (Completed)   Comprehensive metabolic panel (Completed)   TSH (Completed)   Hyperlipidemia, mixed    Encouraged heart healthy diet, increase exercise, avoid trans fats, consider a krill oil cap daily      Relevant Medications   losartan (COZAAR) 25 MG tablet   Other Relevant Orders   TSH (Completed)   Lipid panel (Completed)   Dupuytren's contracture of both hands    Has noted increased arthritic pain and decreased grip strain      Overweight    Encouraged DASH diet, decrease po intake and increase exercise as tolerated. Needs 7-8 hours of sleep nightly. Avoid trans fats, eat small, frequent meals every 4-5 hours with lean proteins, complex carbs and healthy fats. Minimize simple carbs      Osteopenia    Encouraged to get adequate exercise, calcium and vitamin d intake      Relevant Orders   CBC (Completed)   Muscle cramps    Improved with improved hydration and encouraged to try Hyland's leg cramp medicine to use prn      Relevant Orders   Magnesium (Completed)   Hordeolum externum (stye)    Mid eyelid, resolved now with conservative management with hot compresses. She is seeing her opthamologist soon and will notify him      Relevant Orders   CBC (Completed)   Gait disorder    Notes an increase in some shuffling but denies any falls. She is offered PT but declines for now she will let us know if worsens       Other Visit Diagnoses    Needs flu shot    -  Primary   Relevant Orders   Flu vaccine HIGH DOSE PF (Fluzone High dose) (Completed)      I have discontinued Tascha T. Hailes's vitamin C, Probiotic Product (PROBIOTIC DAILY PO), and Turmeric. I have also changed her losartan. Additionally, I am having her maintain her aspirin, Cholecalciferol, Lutein, b complex  vitamins, multivitamin, MAGNESIUM PO, CALCIUM PO, CoQ10, ACCU-CHEK AVIVA PLUS, Krill Oil, carisoprodol, metFORMIN, rosuvastatin, glimepiride, and ACCU-CHEK AVIVA PLUS.  Meds ordered this encounter  Medications  . losartan (COZAAR) 25 MG tablet    Sig: Take 1 tablet (25 mg total) by mouth daily.    Dispense:  90 tablet    Refill:  1   CMA served as Education administrator during this visit. History, Physical and Plan performed by medical provider. Documentation and orders reviewed and attested to.  Penni Homans, MD

## 2018-08-26 NOTE — Assessment & Plan Note (Signed)
Mid eyelid, resolved now with conservative management with hot compresses. She is seeing her opthamologist soon and will notify him

## 2018-08-26 NOTE — Assessment & Plan Note (Signed)
Encouraged to get adequate exercise, calcium and vitamin d intake 

## 2018-08-26 NOTE — Patient Instructions (Addendum)
[place] diet  Hyland's leg cramp medicine under tongue for cramps Magnesium cap daily  DASH Eating Plan No artificial sweetener Stevia may be better than the others DASH stands for "Dietary Approaches to Stop Hypertension." The DASH eating plan is a healthy eating plan that has been shown to reduce high blood pressure (hypertension). It may also reduce your risk for type 2 diabetes, heart disease, and stroke. The DASH eating plan may also help with weight loss. What are tips for following this plan? General guidelines  Avoid eating more than 2,300 mg (milligrams) of salt (sodium) a day. If you have hypertension, you may need to reduce your sodium intake to 1,500 mg a day.  Limit alcohol intake to no more than 1 drink a day for nonpregnant women and 2 drinks a day for men. One drink equals 12 oz of beer, 5 oz of wine, or 1 oz of hard liquor.  Work with your health care provider to maintain a healthy body weight or to lose weight. Ask what an ideal weight is for you.  Get at least 30 minutes of exercise that causes your heart to beat faster (aerobic exercise) most days of the week. Activities may include walking, swimming, or biking.  Work with your health care provider or diet and nutrition specialist (dietitian) to adjust your eating plan to your individual calorie needs. Reading food labels  Check food labels for the amount of sodium per serving. Choose foods with less than 5 percent of the Daily Value of sodium. Generally, foods with less than 300 mg of sodium per serving fit into this eating plan.  To find whole grains, look for the word "whole" as the first word in the ingredient list. Shopping  Buy products labeled as "low-sodium" or "no salt added."  Buy fresh foods. Avoid canned foods and premade or frozen meals. Cooking  Avoid adding salt when cooking. Use salt-free seasonings or herbs instead of table salt or sea salt. Check with your health  care provider or pharmacist before using salt substitutes.  Do not fry foods. Cook foods using healthy methods such as baking, boiling, grilling, and broiling instead.  Cook with heart-healthy oils, such as olive, canola, soybean, or sunflower oil. Meal planning   Eat a balanced diet that includes: ? 5 or more servings of fruits and vegetables each day. At each meal, try to fill half of your plate with fruits and vegetables. ? Up to 6-8 servings of whole grains each day. ? Less than 6 oz of lean meat, poultry, or fish each day. A 3-oz serving of meat is about the same size as a deck of cards. One egg equals 1 oz. ? 2 servings of low-fat dairy each day. ? A serving of nuts, seeds, or beans 5 times each week. ? Heart-healthy fats. Healthy fats called Omega-3 fatty acids are found in foods such as flaxseeds and coldwater fish, like sardines, salmon, and mackerel.  Limit how much you eat of the following: ? Canned or prepackaged foods. ? Food that is high in trans fat, such as fried foods. ? Food that is high in saturated fat, such as fatty meat. ? Sweets, desserts, sugary drinks, and other foods with added sugar. ? Full-fat dairy products.  Do not salt foods before eating.  Try to eat at least 2 vegetarian meals each week.  Eat more home-cooked food and less restaurant, buffet, and fast food.  When eating at a restaurant, ask  that your food be prepared with less salt or no salt, if possible. What foods are recommended? The items listed may not be a complete list. Talk with your dietitian about what dietary choices are best for you. Grains Whole-grain or whole-wheat bread. Whole-grain or whole-wheat pasta. Brown rice. [place] Morrow. Bulgur. Whole-grain and low-sodium cereals. Pita bread. Low-fat, low-sodium crackers. Whole-wheat flour tortillas. Vegetables Fresh or frozen vegetables (raw, steamed, roasted, or grilled). Low-sodium or reduced-sodium tomato and vegetable juice.  Low-sodium or reduced-sodium tomato sauce and tomato paste. Low-sodium or reduced-sodium canned vegetables. Fruits All fresh, dried, or frozen fruit. Canned fruit in natural juice (without added sugar). Meat and other protein foods Skinless chicken or [place]. Ground chicken or [place]. Pork with fat trimmed off. Fish and seafood. Egg whites. Dried beans, peas, or lentils. Unsalted nuts, nut butters, and seeds. Unsalted canned beans. Lean cuts of beef with fat trimmed off. Low-sodium, lean deli meat. Dairy Low-fat (1%) or fat-free (skim) milk. Fat-free, low-fat, or reduced-fat cheeses. Nonfat, low-sodium ricotta or cottage cheese. Low-fat or nonfat yogurt. Low-fat, low-sodium cheese. Fats and oils Soft margarine without trans fats. Vegetable oil. Low-fat, reduced-fat, or light mayonnaise and salad dressings (reduced-sodium). Canola, safflower, olive, soybean, and sunflower oils. Avocado. Seasoning and other foods Herbs. Spices. Seasoning mixes without salt. Unsalted popcorn and pretzels. Fat-free sweets. What foods are not recommended? The items listed may not be a complete list. Talk with your dietitian about what dietary choices are best for you. Grains Baked goods made with fat, such as croissants, muffins, or some breads. Dry pasta or rice meal packs. Vegetables Creamed or fried vegetables. Vegetables in a cheese sauce. Regular canned vegetables (not low-sodium or reduced-sodium). Regular canned tomato sauce and paste (not low-sodium or reduced-sodium). Regular tomato and vegetable juice (not low-sodium or reduced-sodium). Angie Fava. Olives. Fruits Canned fruit in a light or heavy syrup. Fried fruit. Fruit in cream or butter sauce. Meat and other protein foods Fatty cuts of meat. Ribs. Fried meat. Berniece Salines. Sausage. Bologna and other processed lunch meats. Salami. Fatback. Hotdogs. Bratwurst. Salted nuts and seeds. Canned beans with added salt. Canned or smoked fish. Whole eggs or egg yolks. Chicken  or [place] with skin. Dairy Whole or 2% milk, cream, and half-and-half. Whole or full-fat cream cheese. Whole-fat or sweetened yogurt. Full-fat cheese. Nondairy creamers. Whipped toppings. Processed cheese and cheese spreads. Fats and oils Butter. Stick margarine. Lard. Shortening. Ghee. Bacon fat. Tropical oils, such as coconut, palm kernel, or palm oil. Seasoning and other foods Salted popcorn and pretzels. Onion salt, garlic salt, seasoned salt, table salt, and sea salt. Worcestershire sauce. Tartar sauce. Barbecue sauce. Teriyaki sauce. Soy sauce, including reduced-sodium. Steak sauce. Canned and packaged gravies. Fish sauce. Oyster sauce. Cocktail sauce. Horseradish that you find on the shelf. Ketchup. Mustard. Meat flavorings and tenderizers. Bouillon cubes. Hot sauce and Tabasco sauce. Premade or packaged marinades. Premade or packaged taco seasonings. Relishes. Regular salad dressings. Where to find more information:  National Heart, Lung, and [place]: https://wilson-eaton.com/  American Heart Association: www.heart.org Summary  The DASH eating plan is a healthy eating plan that has been shown to reduce high blood pressure (hypertension). It may also reduce your risk for type 2 diabetes, heart disease, and stroke.  With the DASH eating plan, you should limit salt (sodium) intake to 2,300 mg a day. If you have hypertension, you may need to reduce your sodium intake to 1,500 mg a day.  When on the DASH eating plan, aim to eat more fresh fruits  and vegetables, whole grains, lean proteins, low-fat dairy, and heart-healthy fats.  Work with your health care provider or diet and nutrition specialist (dietitian) to adjust your eating plan to your individual calorie needs. This information is not intended to replace advice given to you by your health care provider. Make sure you discuss any questions you have with your health care provider. Document Released: 11/29/2011 Document Revised:  12/03/2016 Document Reviewed: 12/03/2016 Elsevier Interactive Patient Education  [employer].

## 2018-09-22 ENCOUNTER — Other Ambulatory Visit: Payer: Self-pay | Admitting: Family Medicine

## 2018-09-23 DIAGNOSIS — H2513 Age-related nuclear cataract, bilateral: Secondary | ICD-10-CM | POA: Diagnosis not present

## 2018-09-23 DIAGNOSIS — E119 Type 2 diabetes mellitus without complications: Secondary | ICD-10-CM | POA: Diagnosis not present

## 2018-09-23 LAB — HM DIABETES EYE EXAM

## 2018-10-09 ENCOUNTER — Telehealth: Payer: Self-pay | Admitting: *Deleted

## 2018-10-09 NOTE — Telephone Encounter (Signed)
Received Diabetic Eye Exam Report from [facility]; forwarded to provider/SLS 10/17

## 2018-10-17 ENCOUNTER — Encounter: Payer: Self-pay | Admitting: Family Medicine

## 2018-11-19 NOTE — Progress Notes (Signed)
Subjective:   Gail Lester is a 77 y.o. female who presents for Medicare Annual (Subsequent) preventive examination.  Review of Systems: No ROS.  Medicare Wellness Visit. Additional risk factors are reflected in the social history. Cardiac Risk Factors include: advanced age (>69mn, >>22women);diabetes mellitus;dyslipidemia Sleep patterns: sleeping 6-7 hrs. Sleeps well. Feels rested. Home Safety/Smoke Alarms: Feels safe in home. Smoke alarms in place. Lives alone. 1 story. Walk-in shower. Grab rails.   Eye- reports utd annually  Female:  Mammo-pt states she will schedule today.     Dexa scan-active order      CCS- last 2011 w/ 5 yr recall.     Objective:     Vitals: BP 138/78 (BP Location: Left Arm, Patient Position: Sitting, Cuff Size: Normal)   Pulse 77   Ht '5\' 4"'  (1.626 m)   Wt 163 lb 9.6 oz (74.2 kg)   SpO2 96%   BMI 28.08 kg/m   Body mass index is 28.08 kg/m.  Advanced Directives 11/24/2018 11/21/2017 10/24/2015 11/08/2014 12/09/2011 12/09/2011  Does Patient Have a Medical Advance Directive? Yes Yes Yes No Patient does not have advance directive;Patient would not like information Patient does not have advance directive  Type of Advance Directive [place] will [place] will [place] will - - -  Does patient want to make changes to medical advance directive? No - Patient declined - No - Patient declined - - -  Copy of [place] Chart? Yes - validated most recent copy scanned in chart (See row information) Yes No - copy requested - - -  Would patient like information on creating a medical advance directive? - - - Yes - Educational materials given - -  Pre-existing out of facility DNR order (yellow form or pink MOST form) - - - - No No    Tobacco Social History   Tobacco Use  Smoking Status Former Smoker  . Last attempt to quit: 11/17/1989  . Years since quitting:  29.0  Smokeless Tobacco Former UEngineer, structuralgiven: Not Answered   Clinical Intake:     Pain : No/denies pain                 Past Medical History:  Diagnosis Date  . Arthritis   . Chicken pox as a child  . Constipation 02/15/2017  . Cough 03/24/2015  . Diabetes ([place]   . Diabetes mellitus   . Diabetes mellitus type 2 in obese ([facility] 02/10/2013   Saw Dr GDelman Cheadle eye doctor in 01/01/13, exam in chart   . Diarrhea 02/10/2013  . Dupuytren's contracture of both hands 02/10/2013  . Encounter for Medicare annual wellness exam 08/04/2013  . Fibrocystic breast 02/10/2013   Left h/o FN biopsy   . Ganglion cyst 11/14/2014   Right 2nd finger  . Hypercholesteremia   . Keratosis, seborrheic 08/01/2015  . Knee pain, left 02/15/2017  . Left arm pain 11/14/2014  . Measles as a child  . Medicare annual wellness visit, subsequent 11/06/2015  . 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 Mumps as a child  . Osteopenia 05/23/2017  . Other and unspecified hyperlipidemia 02/10/2013  . Other malaise and fatigue 08/04/2013  . Overweight 06/14/2014  . Preventative health care 11/09/2016   Past Surgical History:  Procedure Laterality Date  . APPENDECTOMY  1955  . BREAST BIOPSY  1985-1990   benign  . ORIF WRIST FRACTURE  12/09/2011  Procedure: OPEN REDUCTION INTERNAL FIXATION (ORIF) WRIST FRACTURE;  Surgeon: Linna Hoff;  Location: [place];  Service: Orthopedics;  Laterality: Left;   Family History  Problem Relation Age of Onset  . Kidney disease Mother   . Diabetes Father   . Kidney disease Maternal Grandmother   . Cancer Maternal Grandmother        bladder  . Cancer Maternal Aunt   . Alzheimer's disease Maternal Aunt   . Scoliosis Son   . Colon cancer Unknown        grandparent   Social History   Socioeconomic History  . Marital status: Divorced    Spouse name: Not on file  . Number of children: Not on file  . Years of education: Not on file  . Highest education level: Not  on file  Occupational History  . Not on file  Social Needs  . Financial resource strain: Not on file  . Food insecurity:    Worry: Not on file    Inability: Not on file  . Transportation needs:    Medical: Not on file    Non-medical: Not on file  Tobacco Use  . Smoking status: Former Smoker    Last attempt to quit: 11/17/1989    Years since quitting: 29.0  . Smokeless tobacco: Former Network engineer and Sexual Activity  . Alcohol use: No  . Drug use: No  . Sexual activity: Not Currently  Lifestyle  . Physical activity:    Days per week: Not on file    Minutes per session: Not on file  . Stress: Not on file  Relationships  . Social connections:    Talks on phone: Not on file    Gets together: Not on file    Attends religious service: Not on file    Active member of club or organization: Not on file    Attends meetings of clubs or organizations: Not on file    Relationship status: Not on file  Other Topics Concern  . Not on file  Social History Narrative  . Not on file    Outpatient Encounter Medications as of 11/24/2018  Medication Sig  . ACCU-CHEK AVIVA PLUS test strip CHECK BLOOD SUGAR 4 TIMES  DAILY AND AS NEEDED FOR  LABILE BLOOD SUGARS  . aspirin 81 MG tablet Take 81 mg by mouth daily.  Marland Kitchen b complex vitamins tablet Take 1 tablet by mouth as directed. Two times a week  . Blood Glucose Monitoring Suppl (ACCU-CHEK AVIVA PLUS) W/DEVICE KIT Use as directed  . CALCIUM PO Take 400 mg by mouth daily.  . carisoprodol (SOMA) 350 MG tablet Take 1 tablet (350 mg total) by mouth 2 (two) times daily as needed for muscle spasms.  . Cholecalciferol (D-3-5) 5000 UNITS capsule Take 4,000 Units by mouth daily.   . Coenzyme Q10 (COQ10) 100 MG CAPS Take 1 capsule by mouth daily.  Marland Kitchen glimepiride (AMARYL) 4 MG tablet TAKE 1 TABLET BY MOUTH TWO  TIMES DAILY  . Krill Oil 500 MG CAPS Take 1 capsule by mouth daily.  Marland Kitchen losartan (COZAAR) 25 MG tablet Take 1 tablet (25 mg total) by mouth daily.   . Lutein 20 MG CAPS Take by mouth.  Marland Kitchen MAGNESIUM PO Take 250 mg by mouth daily.   . metFORMIN (GLUCOPHAGE-XR) 500 MG 24 hr tablet TAKE 1 TABLET BY MOUTH 3  TIMES DAILY AS NEEDED (Patient taking differently: 1,000 mg daily. )  . Multiple Vitamin (MULTIVITAMIN) tablet Take 1 tablet  by mouth as directed. Two times a week  . rosuvastatin (CRESTOR) 20 MG tablet TAKE ONE-HALF TABLET BY  MOUTH EVERY DAY   No facility-administered encounter medications on file as of 11/24/2018.     Activities of Daily Living In your present state of health, do you have any difficulty performing the following activities: 11/24/2018  Hearing? N  Vision? N  Difficulty concentrating or making decisions? N  Walking or climbing stairs? N  Dressing or bathing? N  Doing errands, shopping? N  Preparing Food and eating ? N  Using the Toilet? N  In the past six months, have you accidently leaked urine? N  Do you have problems with loss of bowel control? N  Managing your Medications? N  Managing your Finances? N  Housekeeping or managing your Housekeeping? N  Some recent data might be hidden    Patient Care Team: Mosie Lukes, MD as PCP - General (Family Medicine) Sharyne Peach, MD as Consulting Physician (Ophthalmology) Martinique, Amy, MD as Consulting Physician (Dermatology)    Assessment:   This is a routine wellness examination for [place]. Physical assessment deferred to PCP.  Exercise Activities and Dietary recommendations Current Exercise Habits: Home exercise routine, Type of exercise: walking, Time (Minutes): 60, Frequency (Times/Week): 7, Weekly Exercise (Minutes/Week): 420, Intensity: Mild, Exercise limited by: None identified Diet (meal preparation, eat out, water intake, caffeinated beverages, dairy products, fruits and vegetables): in general, a "healthy" diet  , well balanced. Drinks only coffee and water.    Goals    . Continue daily exercise routine.    . Increase physical activity (pt-stated)      . Increase water intake       Fall Risk Fall Risk  11/24/2018 11/21/2017 11/09/2016 03/28/2016 11/08/2014  Falls in the past year? 1 No Yes No No  Number falls in past yr: 0 - 2 or more - -  Injury with Fall? 0 - No - -     Depression Screen PHQ 2/9 Scores 11/24/2018 11/21/2017 11/09/2016 03/28/2016  PHQ - 2 Score 0 0 0 0     Cognitive Function Ad8 score reviewed for issues:  Issues making decisions:no  Less interest in hobbies / activities:no  Repeats questions, stories (family complaining):  Trouble using ordinary gadgets (microwave, computer, phone):no  Forgets the month or year: no  Mismanaging finances: no  Remembering appts:no  Daily problems with thinking and/or memory:no Ad8 score is=0   MMSE - Mini Mental State Exam 11/21/2017 11/09/2016  Orientation to time 5 5  Orientation to Place 5 5  Registration 3 3  Attention/ Calculation 5 5  Recall 3 3  Language- name 2 objects 2 2  Language- repeat 1 1  Language- follow 3 step command 3 3  Language- read & follow direction 1 1  Write a sentence 1 1  Copy design 1 1  Total score 30 30        Immunization History  Administered Date(s) Administered  . Influenza, High Dose Seasonal PF 09/11/2016, 10/03/2017, 08/26/2018  . Influenza,inj,Quad PF,6+ Mos 09/02/2013, 08/31/2014, 09/21/2015  . Pneumococcal Conjugate-13 02/04/2014  . Pneumococcal Polysaccharide-23 01/12/2007  . Tdap 01/13/2008  . Zoster 01/12/2007  . Zoster Recombinat (Shingrix) 08/05/2018, 10/01/2018    Screening Tests Health Maintenance  Topic Date Due  . FOOT EXAM  01/29/2017  . TETANUS/TDAP  04/25/2019 (Originally 01/12/2018)  . HEMOGLOBIN A1C  02/24/2019  . OPHTHALMOLOGY EXAM  09/24/2019  . INFLUENZA VACCINE  Completed  . DEXA SCAN  Completed  .  PNA vac Low Risk Adult  Completed        Plan:    Please schedule your next medicare wellness visit with me in 1 yr.  Continue to eat heart healthy diet (full of fruits, vegetables,  whole grains, lean protein, water--limit salt, fat, and sugar intake) and physical activity as tolerated.  Schedule mammogram as discussed.  Discuss cologuard option with PCP at next visit.  I have personally reviewed and noted the following in the patient's chart:   . Medical and social history . Use of alcohol, tobacco or illicit drugs  . Current medications and supplements . Functional ability and status . Nutritional status . Physical activity . Advanced directives . List of other physicians . Hospitalizations, surgeries, and ER visits in previous 12 months . Vitals . Screenings to include cognitive, depression, and falls . Referrals and appointments  In addition, I have reviewed and discussed with patient certain preventive protocols, quality metrics, and best practice recommendations. A written personalized care plan for preventive services as well as general preventive health recommendations were provided to patient.     Shela Nevin, [place]  11/24/2018

## 2018-11-24 ENCOUNTER — Other Ambulatory Visit: Payer: Self-pay | Admitting: Family Medicine

## 2018-11-24 ENCOUNTER — Encounter: Payer: Self-pay | Admitting: *Deleted

## 2018-11-24 ENCOUNTER — Ambulatory Visit (INDEPENDENT_AMBULATORY_CARE_PROVIDER_SITE_OTHER): Payer: Medicare Other | Admitting: *Deleted

## 2018-11-24 VITALS — BP 138/78 | HR 77 | Ht 64.0 in | Wt 163.6 lb

## 2018-11-24 DIAGNOSIS — E2839 Other primary ovarian failure: Secondary | ICD-10-CM

## 2018-11-24 DIAGNOSIS — Z1231 Encounter for screening mammogram for malignant neoplasm of breast: Secondary | ICD-10-CM

## 2018-11-24 DIAGNOSIS — M8580 Other specified disorders of bone density and structure, unspecified site: Secondary | ICD-10-CM

## 2018-11-24 DIAGNOSIS — Z0000 Encounter for general adult medical examination without abnormal findings: Secondary | ICD-10-CM

## 2018-11-24 NOTE — Patient Instructions (Signed)
Please schedule your next medicare wellness visit with me in 1 yr.  Continue to eat heart healthy diet (full of fruits, vegetables, whole grains, lean protein, water--limit salt, fat, and sugar intake) and physical activity as tolerated.  Schedule mammogram as discussed.  Discuss cologuard option with PCP at next visit.   Gail Lester , Thank you for taking time to come for your Medicare Wellness Visit. I appreciate your ongoing commitment to your health goals. Please review the following plan we discussed and let me know if I can assist you in the future.   These are the goals we discussed: Goals    . Continue daily exercise routine.    . Increase physical activity (pt-stated)    . Increase water intake       This is a list of the screening recommended for you and due dates:  Health Maintenance  Topic Date Due  . Complete foot exam   01/29/2017  . Tetanus Vaccine  04/25/2019*  . Hemoglobin A1C  02/24/2019  . Eye exam for diabetics  09/24/2019  . Flu Shot  Completed  . DEXA scan (bone density measurement)  Completed  . Pneumonia vaccines  Completed  *Topic was postponed. The date shown is not the original due date.    Health Maintenance for Postmenopausal Women Menopause is a normal process in which your reproductive ability comes to an end. This process happens gradually over a span of months to years, usually between the ages of 71 and 40. Menopause is complete when you have missed 12 consecutive menstrual periods. It is important to talk with your health care provider about some of the most common conditions that affect postmenopausal women, such as heart disease, cancer, and bone loss (osteoporosis). Adopting a healthy lifestyle and getting preventive care can help to promote your health and wellness. Those actions can also lower your chances of developing some of these common conditions. What should I know about menopause? During menopause, you may experience a number of  symptoms, such as:  Moderate-to-severe hot flashes.  Night sweats.  Decrease in sex drive.  Mood swings.  Headaches.  Tiredness.  Irritability.  Memory problems.  Insomnia.  Choosing to treat or not to treat menopausal changes is an individual decision that you make with your health care provider. What should I know about hormone replacement therapy and supplements? Hormone therapy products are effective for treating symptoms that are associated with menopause, such as hot flashes and night sweats. Hormone replacement carries certain risks, especially as you become older. If you are thinking about using estrogen or estrogen with progestin treatments, discuss the benefits and risks with your health care provider. What should I know about heart disease and stroke? Heart disease, heart attack, and stroke become more likely as you age. This may be due, in part, to the hormonal changes that your body experiences during menopause. These can affect how your body processes dietary fats, triglycerides, and cholesterol. Heart attack and stroke are both medical emergencies. There are many things that you can do to help prevent heart disease and stroke:  Have your blood pressure checked at least every 1-2 years. High blood pressure causes heart disease and increases the risk of stroke.  If you are 79-32 years old, ask your health care provider if you should take aspirin to prevent a heart attack or a stroke.  Do not use any tobacco products, including cigarettes, chewing tobacco, or electronic cigarettes. If you need help quitting, ask your health care  provider.  It is important to eat a healthy diet and maintain a healthy weight. ? Be sure to include plenty of vegetables, fruits, low-fat dairy products, and lean protein. ? Avoid eating foods that are high in solid fats, added sugars, or salt (sodium).  Get regular exercise. This is one of the most important things that you can do for your  health. ? Try to exercise for at least 150 minutes each week. The type of exercise that you do should increase your heart rate and make you sweat. This is known as moderate-intensity exercise. ? Try to do strengthening exercises at least twice each week. Do these in addition to the moderate-intensity exercise.  Know your numbers.Ask your health care provider to check your cholesterol and your blood glucose. Continue to have your blood tested as directed by your health care provider.  What should I know about cancer screening? There are several types of cancer. Take the following steps to reduce your risk and to catch any cancer development as early as possible. Breast Cancer  Practice breast self-awareness. ? This means understanding how your breasts normally appear and feel. ? It also means doing regular breast self-exams. Let your health care provider know about any changes, no matter how small.  If you are 30 or older, have a clinician do a breast exam (clinical breast exam or CBE) every year. Depending on your age, family history, and medical history, it may be recommended that you also have a yearly breast X-ray (mammogram).  If you have a family history of breast cancer, talk with your health care provider about genetic screening.  If you are at high risk for breast cancer, talk with your health care provider about having an MRI and a mammogram every year.  Breast cancer (BRCA) gene test is recommended for women who have family members with BRCA-related cancers. Results of the assessment will determine the need for genetic counseling and BRCA1 and for BRCA2 testing. BRCA-related cancers include these types: ? Breast. This occurs in males or females. ? Ovarian. ? Tubal. This may also be called fallopian tube cancer. ? Cancer of the abdominal or pelvic lining (peritoneal cancer). ? Prostate. ? Pancreatic.  Cervical, Uterine, and Ovarian Cancer Your health care provider may recommend  that you be screened regularly for cancer of the pelvic organs. These include your ovaries, uterus, and vagina. This screening involves a pelvic exam, which includes checking for microscopic changes to the surface of your cervix (Pap test).  For women ages 21-65, health care providers may recommend a pelvic exam and a Pap test every three years. For women ages 22-65, they may recommend the Pap test and pelvic exam, combined with testing for human papilloma virus (HPV), every five years. Some types of HPV increase your risk of cervical cancer. Testing for HPV may also be done on women of any age who have unclear Pap test results.  Other health care providers may not recommend any screening for nonpregnant women who are considered low risk for pelvic cancer and have no symptoms. Ask your health care provider if a screening pelvic exam is right for you.  If you have had past treatment for cervical cancer or a condition that could lead to cancer, you need Pap tests and screening for cancer for at least 20 years after your treatment. If Pap tests have been discontinued for you, your risk factors (such as having a new sexual partner) need to be reassessed to determine if you should start  having screenings again. Some women have medical problems that increase the chance of getting cervical cancer. In these cases, your health care provider may recommend that you have screening and Pap tests more often.  If you have a family history of uterine cancer or ovarian cancer, talk with your health care provider about genetic screening.  If you have vaginal bleeding after reaching menopause, tell your health care provider.  There are currently no reliable tests available to screen for ovarian cancer.  Lung Cancer Lung cancer screening is recommended for adults 5-65 years old who are at high risk for lung cancer because of a history of smoking. A yearly low-dose CT scan of the lungs is recommended if you:  Currently  smoke.  Have a history of at least 30 pack-years of smoking and you currently smoke or have quit within the past 15 years. A pack-year is smoking an average of one pack of cigarettes per day for one year.  Yearly screening should:  Continue until it has been 15 years since you quit.  Stop if you develop a health problem that would prevent you from having lung cancer treatment.  Colorectal Cancer  This type of cancer can be detected and can often be prevented.  Routine colorectal cancer screening usually begins at age 72 and continues through age 53.  If you have risk factors for colon cancer, your health care provider may recommend that you be screened at an earlier age.  If you have a family history of colorectal cancer, talk with your health care provider about genetic screening.  Your health care provider may also recommend using home test kits to check for hidden blood in your stool.  A small camera at the end of a tube can be used to examine your colon directly (sigmoidoscopy or colonoscopy). This is done to check for the earliest forms of colorectal cancer.  Direct examination of the colon should be repeated every 5-10 years until age 20. However, if early forms of precancerous polyps or small growths are found or if you have a family history or genetic risk for colorectal cancer, you may need to be screened more often.  Skin Cancer  Check your skin from head to toe regularly.  Monitor any moles. Be sure to tell your health care provider: ? About any new moles or changes in moles, especially if there is a change in a mole's shape or color. ? If you have a mole that is larger than the size of a pencil eraser.  If any of your family members has a history of skin cancer, especially at a young age, talk with your health care provider about genetic screening.  Always use sunscreen. Apply sunscreen liberally and repeatedly throughout the day.  Whenever you are outside, protect  yourself by wearing long sleeves, pants, a wide-brimmed hat, and sunglasses.  What should I know about osteoporosis? Osteoporosis is a condition in which bone destruction happens more quickly than new bone creation. After menopause, you may be at an increased risk for osteoporosis. To help prevent osteoporosis or the bone fractures that can happen because of osteoporosis, the following is recommended:  If you are 55-48 years old, get at least 1,000 mg of calcium and at least 600 mg of vitamin D per day.  If you are older than age 30 but younger than age 88, get at least 1,200 mg of calcium and at least 600 mg of vitamin D per day.  If you are older than  age 56, get at least 1,200 mg of calcium and at least 800 mg of vitamin D per day.  Smoking and excessive alcohol intake increase the risk of osteoporosis. Eat foods that are rich in calcium and vitamin D, and do weight-bearing exercises several times each week as directed by your health care provider. What should I know about how menopause affects my mental health? Depression may occur at any age, but it is more common as you become older. Common symptoms of depression include:  Low or sad mood.  Changes in sleep patterns.  Changes in appetite or eating patterns.  Feeling an overall lack of motivation or enjoyment of activities that you previously enjoyed.  Frequent crying spells.  Talk with your health care provider if you think that you are experiencing depression. What should I know about immunizations? It is important that you get and maintain your immunizations. These include:  Tetanus, diphtheria, and pertussis (Tdap) booster vaccine.  Influenza every year before the flu season begins.  Pneumonia vaccine.  Shingles vaccine.  Your health care provider may also recommend other immunizations. This information is not intended to replace advice given to you by your health care provider. Make sure you discuss any questions you  have with your health care provider. Document Released: 02/01/2006 Document Revised: 06/29/2016 Document Reviewed: 09/13/2015 Elsevier Interactive Patient Education  2018 [employer].

## 2018-11-24 NOTE — Progress Notes (Unsigned)
de

## 2018-11-24 NOTE — Progress Notes (Signed)
RN Note reviewed and agree.  Hiro Vipond S O'Sullivan NP 

## 2018-11-27 ENCOUNTER — Encounter (HOSPITAL_BASED_OUTPATIENT_CLINIC_OR_DEPARTMENT_OTHER): Payer: Self-pay

## 2018-11-27 ENCOUNTER — Ambulatory Visit (HOSPITAL_BASED_OUTPATIENT_CLINIC_OR_DEPARTMENT_OTHER)
Admission: RE | Admit: 2018-11-27 | Discharge: 2018-11-27 | Disposition: A | Discharge status: Home / Self Care | Payer: Medicare Other | Source: Ambulatory Visit | Attending: Family Medicine | Admitting: Family Medicine

## 2018-11-27 DIAGNOSIS — M85831 Other specified disorders of bone density and structure, right forearm: Secondary | ICD-10-CM | POA: Diagnosis not present

## 2018-11-27 DIAGNOSIS — E2839 Other primary ovarian failure: Secondary | ICD-10-CM | POA: Insufficient documentation

## 2018-11-27 DIAGNOSIS — Z780 Asymptomatic menopausal state: Secondary | ICD-10-CM | POA: Diagnosis not present

## 2018-11-27 DIAGNOSIS — M8580 Other specified disorders of bone density and structure, unspecified site: Secondary | ICD-10-CM

## 2018-11-27 DIAGNOSIS — Z1231 Encounter for screening mammogram for malignant neoplasm of breast: Secondary | ICD-10-CM | POA: Diagnosis not present

## 2018-12-26 ENCOUNTER — Other Ambulatory Visit: Payer: Self-pay | Admitting: Family Medicine

## 2018-12-26 ENCOUNTER — Telehealth: Payer: Self-pay | Admitting: *Deleted

## 2018-12-26 DIAGNOSIS — R252 Cramp and spasm: Secondary | ICD-10-CM

## 2018-12-26 DIAGNOSIS — E782 Mixed hyperlipidemia: Secondary | ICD-10-CM

## 2018-12-26 DIAGNOSIS — E669 Obesity, unspecified: Secondary | ICD-10-CM

## 2018-12-26 DIAGNOSIS — K5900 Constipation, unspecified: Secondary | ICD-10-CM

## 2018-12-26 DIAGNOSIS — E1169 Type 2 diabetes mellitus with other specified complication: Secondary | ICD-10-CM

## 2018-12-26 NOTE — Telephone Encounter (Signed)
ordered

## 2018-12-26 NOTE — Telephone Encounter (Signed)
Pt has lab appt for Monday but I do not see any future orders placed in Epic.  Please place future orders if appropriate or advise if labs not needed at this time.

## 2018-12-29 ENCOUNTER — Other Ambulatory Visit (INDEPENDENT_AMBULATORY_CARE_PROVIDER_SITE_OTHER): Payer: Medicare Other

## 2018-12-29 DIAGNOSIS — E669 Obesity, unspecified: Secondary | ICD-10-CM | POA: Diagnosis not present

## 2018-12-29 DIAGNOSIS — E1169 Type 2 diabetes mellitus with other specified complication: Secondary | ICD-10-CM | POA: Diagnosis not present

## 2018-12-29 DIAGNOSIS — R252 Cramp and spasm: Secondary | ICD-10-CM

## 2018-12-29 DIAGNOSIS — K5900 Constipation, unspecified: Secondary | ICD-10-CM | POA: Diagnosis not present

## 2018-12-29 LAB — CBC
HCT: 39.6 % (ref 36.0–46.0)
Hemoglobin: 13.3 g/dL (ref 12.0–15.0)
MCHC: 33.6 g/dL (ref 30.0–36.0)
MCV: 88.9 fl (ref 78.0–100.0)
PLATELETS: 284 10*3/uL (ref 150.0–400.0)
RBC: 4.46 Mil/uL (ref 3.87–5.11)
RDW: 13.6 % (ref 11.5–15.5)
WBC: 6.3 10*3/uL (ref 4.0–10.5)

## 2018-12-29 LAB — COMPREHENSIVE METABOLIC PANEL
ALK PHOS: 48 U/L (ref 39–117)
ALT: 18 U/L (ref 0–35)
AST: 19 U/L (ref 0–37)
Albumin: 3.9 g/dL (ref 3.5–5.2)
BILIRUBIN TOTAL: 0.4 mg/dL (ref 0.2–1.2)
BUN: 13 mg/dL (ref 6–23)
CO2: 29 meq/L (ref 19–32)
Calcium: 9.3 mg/dL (ref 8.4–10.5)
Chloride: 103 mEq/L (ref 96–112)
Creatinine, Ser: 0.68 mg/dL (ref 0.40–1.20)
GFR: 89.06 mL/min (ref >60.00)
GLUCOSE: 86 mg/dL (ref 70–99)
Potassium: 4.3 mEq/L (ref 3.5–5.1)
Sodium: 140 mEq/L (ref 135–145)
TOTAL PROTEIN: 6.3 g/dL (ref 6.0–8.3)

## 2018-12-29 LAB — MAGNESIUM: Magnesium: 1.6 mg/dL (ref 1.5–2.5)

## 2018-12-29 LAB — LIPID PANEL
CHOL/HDL RATIO: 4
Cholesterol: 169 mg/dL (ref 0–200)
HDL: 43.7 mg/dL (ref >39.00)
LDL Cholesterol: 95 mg/dL (ref 0–99)
NONHDL: 124.93
Triglycerides: 149 mg/dL (ref 0.0–149.0)
VLDL: 29.8 mg/dL (ref 0.0–40.0)

## 2018-12-29 LAB — TSH: TSH: 3.49 u[IU]/mL (ref 0.35–4.50)

## 2018-12-29 LAB — HEMOGLOBIN A1C: Hgb A1c MFr Bld: 7.6 % — ABNORMAL HIGH (ref 4.6–6.5)

## 2019-01-02 ENCOUNTER — Ambulatory Visit (INDEPENDENT_AMBULATORY_CARE_PROVIDER_SITE_OTHER): Payer: Medicare Other | Admitting: Family Medicine

## 2019-01-02 ENCOUNTER — Ambulatory Visit (HOSPITAL_BASED_OUTPATIENT_CLINIC_OR_DEPARTMENT_OTHER)
Admission: RE | Admit: 2019-01-02 | Discharge: 2019-01-02 | Disposition: A | Discharge status: Home / Self Care | Payer: Medicare Other | Source: Ambulatory Visit | Attending: Family Medicine | Admitting: Family Medicine

## 2019-01-02 VITALS — BP 143/59 | HR 79 | Temp 98.1°F | Resp 18 | Ht 64.0 in | Wt 160.4 lb

## 2019-01-02 DIAGNOSIS — M8580 Other specified disorders of bone density and structure, unspecified site: Secondary | ICD-10-CM

## 2019-01-02 DIAGNOSIS — R197 Diarrhea, unspecified: Secondary | ICD-10-CM

## 2019-01-02 DIAGNOSIS — E782 Mixed hyperlipidemia: Secondary | ICD-10-CM | POA: Diagnosis not present

## 2019-01-02 DIAGNOSIS — E669 Obesity, unspecified: Secondary | ICD-10-CM

## 2019-01-02 DIAGNOSIS — E1169 Type 2 diabetes mellitus with other specified complication: Secondary | ICD-10-CM | POA: Diagnosis not present

## 2019-01-02 NOTE — Patient Instructions (Addendum)
[facility] of [employer] should have information about [place] [employer] changes  Encouraged increased hydration and fiber in diet. Daily probiotics. If bowels not moving can use MOM 2 tbls po in 4 oz of warm prune juice by mouth every 2-3 days. If no results then repeat in 4 hours with  Dulcolax suppository pr, may repeat again in 4 more hours as needed. Seek care if symptoms worsen. Consider daily Miralax and/or Dulcolax if symptoms persist.  Diarrhea, Adult Diarrhea is frequent loose and watery bowel movements. Diarrhea can make you feel weak and cause you to become dehydrated. Dehydration can make you tired and thirsty, cause you to have a dry mouth, and decrease how often you urinate. Diarrhea typically lasts 2-3 days. However, it can last longer if it is a sign of something more serious. It is important to treat your diarrhea as told by your health care provider. Follow these instructions at home: Eating and drinking     Follow these recommendations as told by your health care provider:  Take an oral rehydration solution (ORS). This is an over-the-counter medicine that helps return your body to its normal balance of nutrients and water. It is found at pharmacies and retail stores.  Drink plenty of fluids, such as water, ice chips, diluted fruit juice, and low-calorie sports drinks. You can drink milk also, if desired.  Avoid drinking fluids that contain a lot of sugar or caffeine, such as energy drinks, sports drinks, and soda.  Eat bland, easy-to-digest foods in small amounts as you are able. These foods include bananas, applesauce, rice, lean meats, toast, and crackers.  Avoid alcohol.  Avoid spicy or fatty foods.  Medicines  Take over-the-counter and prescription medicines only as told by your health care provider.  If you were prescribed an antibiotic medicine, take it as told by your health care provider. Do not stop using the antibiotic even if  you start to feel better. General instructions   Wash your hands often using soap and water. If soap and water are not available, use a hand sanitizer. Others in the household should wash their hands as well. Hands should be washed: ? After using the toilet or changing a diaper. ? Before preparing, cooking, or serving food. ? While caring for a sick person or while visiting someone in a hospital.  Drink enough fluid to keep your urine pale yellow.  Rest at home while you recover.  Watch your condition for any changes.  Take a warm bath to relieve any burning or pain from frequent diarrhea episodes.  Keep all follow-up visits as told by your health care provider. This is important. Contact a health care provider if:  You have a fever.  Your diarrhea gets worse.  You have new symptoms.  You cannot keep fluids down.  You feel light-headed or dizzy.  You have a headache.  You have muscle cramps. Get help right away if:  You have chest pain.  You feel extremely weak or you faint.  You have bloody or black stools or stools that look like tar.  You have severe pain, cramping, or bloating in your abdomen.  You have trouble breathing or you are breathing very quickly.  Your heart is beating very quickly.  Your skin feels cold and clammy.  You feel confused.  You have signs of dehydration, such as: ? Dark urine, very little urine, or no urine. ? Cracked lips. ? Dry mouth. ? Sunken eyes. ? Sleepiness. ?  Weakness. Summary  Diarrhea is frequent loose and watery bowel movements. Diarrhea can make you feel weak and cause you to become dehydrated.  Drink enough fluids to keep your urine pale yellow.  Make sure that you wash your hands after using the toilet. If soap and water are not available, use hand sanitizer.  Contact a health care provider if your diarrhea gets worse or you have new symptoms.  Get help right away if you have signs of dehydration. This  information is not intended to replace advice given to you by your health care provider. Make sure you discuss any questions you have with your health care provider. Document Released: 11/30/2002 Document Revised: 05/16/2018 Document Reviewed: 05/16/2018 Elsevier Interactive Patient Education  2019 [employer].

## 2019-01-04 NOTE — Assessment & Plan Note (Signed)
Historically has had constipation now has soft stool daily. No bloody or tarry stool. Check stool cultures and try Cholstyramine.

## 2019-01-04 NOTE — Progress Notes (Signed)
Subjective:    Patient ID: Gail Lester, female    DOB: 12-25-1940, 78 y.o.   MRN: [id number]  No chief complaint on file.   HPI Patient is in today for follow up. She is struggling with diarrhea. Historically she has had constipation but she is now on a raw food diet and noting diarrhea. No polyuria or polydipsia. No bloody or tarry stool. Has had some recent UTI but no symptoms today. Denies CP/palp/SOB/HA/congestion/fevers/GI or GU c/o. Taking meds as prescribed  Past Medical History:  Diagnosis Date  . Arthritis   . Chicken pox as a child  . Constipation 02/15/2017  . Cough 03/24/2015  . Diabetes ([place])   . Diabetes mellitus   . Diabetes mellitus type 2 in obese [facility]) 02/10/2013   Saw Dr Delman Cheadle, eye doctor in 01/01/13, exam in chart   . Diarrhea 02/10/2013  . Dupuytren's contracture of both hands 02/10/2013  . Encounter for Medicare annual wellness exam 08/04/2013  . Fibrocystic breast 02/10/2013   Left h/o FN biopsy   . Ganglion cyst 11/14/2014   Right 2nd finger  . Hypercholesteremia   . Keratosis, seborrheic 08/01/2015  . Knee pain, left 02/15/2017  . Left arm pain 11/14/2014  . Measles as a child  . Medicare annual wellness visit, subsequent 11/06/2015  . 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 Mumps as a child  . Osteopenia 05/23/2017  . Other and unspecified hyperlipidemia 02/10/2013  . Other malaise and fatigue 08/04/2013  . Overweight 06/14/2014  . Preventative health care 11/09/2016    Past Surgical History:  Procedure Laterality Date  . APPENDECTOMY  1955  . BREAST BIOPSY  1985-1990   benign  . ORIF WRIST FRACTURE  12/09/2011   Procedure: OPEN REDUCTION INTERNAL FIXATION (ORIF) WRIST FRACTURE;  Surgeon: Linna Hoff;  Location: [place];  Service: Orthopedics;  Laterality: Left;    Family History  Problem Relation Age of Onset  . Kidney disease Mother   . Diabetes Father   . Kidney disease Maternal Grandmother   . Cancer Maternal Grandmother        bladder    . Cancer Maternal Aunt   . Alzheimer's disease Maternal Aunt   . Scoliosis Son   . Colon cancer Unknown        grandparent    Social History   Socioeconomic History  . Marital status: Divorced    Spouse name: Not on file  . Number of children: Not on file  . Years of education: Not on file  . Highest education level: Not on file  Occupational History  . Not on file  Social Needs  . Financial resource strain: Not on file  . Food insecurity:    Worry: Not on file    Inability: Not on file  . Transportation needs:    Medical: Not on file    Non-medical: Not on file  Tobacco Use  . Smoking status: Former Smoker    Last attempt to quit: 11/17/1989    Years since quitting: 29.1  . Smokeless tobacco: Former Network engineer and Sexual Activity  . Alcohol use: No  . Drug use: No  . Sexual activity: Not Currently  Lifestyle  . Physical activity:    Days per week: Not on file    Minutes per session: Not on file  . Stress: Not on file  Relationships  . Social connections:    Talks on phone: Not on file    Gets together: Not  on file    Attends religious service: Not on file    Active member of club or organization: Not on file    Attends meetings of clubs or organizations: Not on file    Relationship status: Not on file  . Intimate partner violence:    Fear of current or ex partner: Not on file    Emotionally abused: Not on file    Physically abused: Not on file    Forced sexual activity: Not on file  Other Topics Concern  . Not on file  Social History Narrative  . Not on file    Outpatient Medications Prior to Visit  Medication Sig Dispense Refill  . ACCU-CHEK AVIVA PLUS test strip CHECK BLOOD SUGAR 4 TIMES  DAILY AND AS NEEDED FOR  LABILE BLOOD SUGARS 450 each 0  . aspirin 81 MG tablet Take 81 mg by mouth daily.    Marland Kitchen b complex vitamins tablet Take 1 tablet by mouth as directed. Two times a week    . Blood Glucose Monitoring Suppl (ACCU-CHEK AVIVA PLUS) W/DEVICE  KIT Use as directed 1 kit 0  . CALCIUM PO Take 400 mg by mouth daily.    . carisoprodol (SOMA) 350 MG tablet Take 1 tablet (350 mg total) by mouth 2 (two) times daily as needed for muscle spasms. 20 tablet 1  . Cholecalciferol (D-3-5) 5000 UNITS capsule Take 4,000 Units by mouth daily.     . Coenzyme Q10 (COQ10) 100 MG CAPS Take 1 capsule by mouth daily.    Marland Kitchen glimepiride (AMARYL) 4 MG tablet TAKE 1 TABLET BY MOUTH TWO  TIMES DAILY 180 tablet 0  . Krill Oil 500 MG CAPS Take 1 capsule by mouth daily.    Marland Kitchen losartan (COZAAR) 25 MG tablet Take 1 tablet (25 mg total) by mouth daily. 90 tablet 1  . Lutein 20 MG CAPS Take by mouth.    Marland Kitchen MAGNESIUM PO Take 250 mg by mouth daily.     . metFORMIN (GLUCOPHAGE-XR) 500 MG 24 hr tablet TAKE 1 TABLET BY MOUTH 3  TIMES DAILY AS NEEDED (Patient taking differently: 1,000 mg daily. ) 270 tablet 1  . Multiple Vitamin (MULTIVITAMIN) tablet Take 1 tablet by mouth as directed. Two times a week    . rosuvastatin (CRESTOR) 20 MG tablet TAKE ONE-HALF TABLET BY  MOUTH EVERY DAY 45 tablet 3   No facility-administered medications prior to visit.     Allergies  Allergen Reactions  . Ramipril Diarrhea  . Lipitor [Atorvastatin]     Diarrhea, muscle cramps  . Lisinopril     myalgias    Review of Systems  Constitutional: Negative for fever and malaise/fatigue.  HENT: Negative for congestion.   Eyes: Negative for blurred vision.  Respiratory: Negative for shortness of breath.   Cardiovascular: Negative for chest pain, palpitations and leg swelling.  Gastrointestinal: Positive for diarrhea. Negative for abdominal pain, blood in stool, constipation, melena and nausea.  Genitourinary: Negative for dysuria and frequency.  Musculoskeletal: Negative for falls.  Skin: Negative for rash.  Neurological: Negative for dizziness, loss of consciousness and headaches.  Endo/Heme/Allergies: Negative for environmental allergies.  Psychiatric/Behavioral: Negative for depression.  The patient is not nervous/anxious.        Objective:    Physical Exam Vitals signs and nursing note reviewed.  Constitutional:      General: She is not in acute distress.    Appearance: She is well-developed.  HENT:     Head: Normocephalic and atraumatic.  Nose: Nose normal.  Eyes:     General:        Right eye: No discharge.        Left eye: No discharge.  Neck:     Musculoskeletal: Normal range of motion and neck supple.  Cardiovascular:     Rate and Rhythm: Normal rate and regular rhythm.     Heart sounds: No murmur.  Pulmonary:     Effort: Pulmonary effort is normal.     Breath sounds: Normal breath sounds.  Abdominal:     General: Bowel sounds are normal.     Palpations: Abdomen is soft.     Tenderness: There is no abdominal tenderness.  Skin:    General: Skin is warm and dry.  Neurological:     Mental Status: She is alert and oriented to person, place, and time.     BP (!) 143/59 (BP Location: Left Arm, Patient Position: Sitting, Cuff Size: Normal)   Pulse 79   Temp 98.1 F (36.7 C) (Oral)   Resp 18   Ht _0  (1.626 m)   Wt 160 lb 6.4 oz (72.8 kg)   SpO2 98%   BMI 27.53 kg/m  Wt Readings from Last 3 Encounters:  01/02/19 160 lb 6.4 oz (72.8 kg)  11/24/18 163 lb 9.6 oz (74.2 kg)  08/26/18 163 lb (73.9 kg)     Lab Results  Component Value Date   WBC 6.3 12/29/2018   HGB 13.3 12/29/2018   HCT 39.6 12/29/2018   PLT 284.0 12/29/2018   GLUCOSE 86 12/29/2018   CHOL 169 12/29/2018   TRIG 149.0 12/29/2018   HDL 43.70 12/29/2018   LDLDIRECT 74.0 05/23/2017   LDLCALC 95 12/29/2018   ALT 18 12/29/2018   AST 19 12/29/2018   NA 140 12/29/2018   K 4.3 12/29/2018   CL 103 12/29/2018   CREATININE 0.68 12/29/2018   BUN 13 12/29/2018   CO2 29 12/29/2018   TSH 3.49 12/29/2018   HGBA1C 7.6 (H) 12/29/2018   MICROALBUR 1.6 11/03/2015    Lab Results  Component Value Date   TSH 3.49 12/29/2018   Lab Results  Component Value Date   WBC 6.3  12/29/2018   HGB 13.3 12/29/2018   HCT 39.6 12/29/2018   MCV 88.9 12/29/2018   PLT 284.0 12/29/2018   Lab Results  Component Value Date   NA 140 12/29/2018   K 4.3 12/29/2018   CO2 29 12/29/2018   GLUCOSE 86 12/29/2018   BUN 13 12/29/2018   CREATININE 0.68 12/29/2018   BILITOT 0.4 12/29/2018   ALKPHOS 48 12/29/2018   AST 19 12/29/2018   ALT 18 12/29/2018   PROT 6.3 12/29/2018   ALBUMIN 3.9 12/29/2018   CALCIUM 9.3 12/29/2018   GFR 89.06 12/29/2018   Lab Results  Component Value Date   CHOL 169 12/29/2018   Lab Results  Component Value Date   HDL 43.70 12/29/2018   Lab Results  Component Value Date   LDLCALC 95 12/29/2018   Lab Results  Component Value Date   TRIG 149.0 12/29/2018   Lab Results  Component Value Date   CHOLHDL 4 12/29/2018   Lab Results  Component Value Date   HGBA1C 7.6 (H) 12/29/2018       Assessment & Plan:   Problem List Items Addressed This Visit    Diabetes mellitus type 2 in obese ([place])    Sugars have been up some but she has switched to a nearly all raw vegetable diet.  hgba1c acceptable, minimize simple carbs. Increase exercise as tolerated. Continue current meds      Hyperlipidemia, mixed    Encouraged heart healthy diet, increase exercise, avoid trans fats, consider a krill oil cap daily      Diarrhea - Primary    Historically has had constipation now has soft stool daily. No bloody or tarry stool. Check stool cultures and try Cholstyramine.       Relevant Orders   DG Abd 2 Views (Completed)   Stool, WBC/Lactoferrin   Stool Culture   Clostridium difficile Toxin B, Qualitative, Real-Time PCR([facility])   Osteopenia    Encouraged to get adequate exercise, calcium and vitamin d intake         I am having Glenda T. Gravette maintain her aspirin, Cholecalciferol, Lutein, b complex vitamins, multivitamin, MAGNESIUM PO, CALCIUM PO, CoQ10, ACCU-CHEK AVIVA PLUS, Krill Oil, carisoprodol, metFORMIN, rosuvastatin, ACCU-CHEK AVIVA PLUS,  losartan, and glimepiride.  No orders of the defined types were placed in this encounter.    Penni Homans, MD

## 2019-01-04 NOTE — Assessment & Plan Note (Signed)
Encouraged to get adequate exercise, calcium and vitamin d intake 

## 2019-01-04 NOTE — Assessment & Plan Note (Signed)
Encouraged heart healthy diet, increase exercise, avoid trans fats, consider a krill oil cap daily 

## 2019-01-04 NOTE — Assessment & Plan Note (Signed)
Sugars have been up some but she has switched to a nearly all raw vegetable diet. hgba1c acceptable, minimize simple carbs. Increase exercise as tolerated. Continue current meds

## 2019-01-07 ENCOUNTER — Other Ambulatory Visit: Payer: Medicare Other

## 2019-01-07 DIAGNOSIS — R197 Diarrhea, unspecified: Secondary | ICD-10-CM

## 2019-01-07 NOTE — Addendum Note (Signed)
Addended by: Caffie Pinto on: 01/07/2019 10:13 AM   Modules accepted: Orders

## 2019-01-09 LAB — FECAL LACTOFERRIN, QUANT
Fecal Lactoferrin: POSITIVE — AB
MICRO NUMBER:: 59296
SPECIMEN QUALITY:: ADEQUATE

## 2019-01-09 LAB — CLOSTRIDIUM DIFFICILE TOXIN B, QUALITATIVE, REAL-TIME PCR: Toxigenic C. Difficile by PCR: NOT DETECTED

## 2019-01-09 MED ORDER — FLUCONAZOLE 150 MG PO TABS: ORAL_TABLET | ORAL | 0 refills | Status: DC

## 2019-01-09 NOTE — Addendum Note (Signed)
Addended by: Magdalene Molly A on: 01/09/2019 03:18 PM   Modules accepted: Orders

## 2019-01-11 LAB — STOOL CULTURE
MICRO NUMBER:: 59086
MICRO NUMBER:: 59087
MICRO NUMBER:: 59088
SHIGA RESULT:: NOT DETECTED
SPECIMEN QUALITY: ADEQUATE
SPECIMEN QUALITY:: ADEQUATE
SPECIMEN QUALITY:: ADEQUATE

## 2019-01-13 ENCOUNTER — Telehealth: Payer: Self-pay

## 2019-01-13 NOTE — Telephone Encounter (Signed)
Copied from [place] [id number]. Topic: General - Other >> Jan 09, 2019  1:22 PM [name] wrote: Reason for CRM: pt called and stated that she received a call from Dr Charlett Blake. Do not see any notes in the chart. Pt would like a call back. Please advise   Spoke with patient about her results

## 2019-01-29 ENCOUNTER — Other Ambulatory Visit: Payer: Self-pay | Admitting: Family Medicine

## 2019-02-15 ENCOUNTER — Other Ambulatory Visit: Payer: Self-pay | Admitting: Family Medicine

## 2019-02-15 DIAGNOSIS — E1169 Type 2 diabetes mellitus with other specified complication: Secondary | ICD-10-CM

## 2019-02-15 DIAGNOSIS — E669 Obesity, unspecified: Principal | ICD-10-CM

## 2019-02-19 ENCOUNTER — Ambulatory Visit (INDEPENDENT_AMBULATORY_CARE_PROVIDER_SITE_OTHER): Payer: Medicare Other | Admitting: Family Medicine

## 2019-02-19 ENCOUNTER — Encounter: Payer: Self-pay | Admitting: Family Medicine

## 2019-02-19 DIAGNOSIS — E1169 Type 2 diabetes mellitus with other specified complication: Secondary | ICD-10-CM | POA: Diagnosis not present

## 2019-02-19 DIAGNOSIS — M8580 Other specified disorders of bone density and structure, unspecified site: Secondary | ICD-10-CM | POA: Diagnosis not present

## 2019-02-19 DIAGNOSIS — E119 Type 2 diabetes mellitus without complications: Secondary | ICD-10-CM

## 2019-02-19 DIAGNOSIS — R197 Diarrhea, unspecified: Secondary | ICD-10-CM | POA: Diagnosis not present

## 2019-02-19 DIAGNOSIS — E669 Obesity, unspecified: Secondary | ICD-10-CM

## 2019-02-19 DIAGNOSIS — E782 Mixed hyperlipidemia: Secondary | ICD-10-CM

## 2019-02-19 DIAGNOSIS — E663 Overweight: Secondary | ICD-10-CM

## 2019-02-19 LAB — COMPREHENSIVE METABOLIC PANEL
ALT: 15 U/L (ref 0–35)
AST: 19 U/L (ref 0–37)
Albumin: 4.2 g/dL (ref 3.5–5.2)
Alkaline Phosphatase: 56 U/L (ref 39–117)
BUN: 14 mg/dL (ref 6–23)
CO2: 27 mEq/L (ref 19–32)
Calcium: 9.4 mg/dL (ref 8.4–10.5)
Chloride: 105 mEq/L (ref 96–112)
Creatinine, Ser: 0.66 mg/dL (ref 0.40–1.20)
GFR: 86.7 mL/min (ref >60.00)
Glucose, Bld: 121 mg/dL — ABNORMAL HIGH (ref 70–99)
Potassium: 4.2 mEq/L (ref 3.5–5.1)
Sodium: 141 mEq/L (ref 135–145)
Total Bilirubin: 0.5 mg/dL (ref 0.2–1.2)
Total Protein: 6.6 g/dL (ref 6.0–8.3)

## 2019-02-19 NOTE — Assessment & Plan Note (Signed)
Tolerating statin, encouraged heart healthy diet, avoid trans fats, minimize simple carbs and saturated fats. Increase exercise as tolerated 

## 2019-02-19 NOTE — Assessment & Plan Note (Signed)
Encouraged to get adequate exercise, calcium and vitamin d intake 

## 2019-02-19 NOTE — Patient Instructions (Signed)
[employer] Blood pressure cuffs  Diarrhea, Adult Diarrhea is frequent loose and watery bowel movements. Diarrhea can make you feel weak and cause you to become dehydrated. Dehydration can make you tired and thirsty, cause you to have a dry mouth, and decrease how often you urinate. Diarrhea typically lasts 2-3 days. However, it can last longer if it is a sign of something more serious. It is important to treat your diarrhea as told by your health care provider. Follow these instructions at home: Eating and drinking     Follow these recommendations as told by your health care provider:  Take an oral rehydration solution (ORS). This is an over-the-counter medicine that helps return your body to its normal balance of nutrients and water. It is found at pharmacies and retail stores.  Drink plenty of fluids, such as water, ice chips, diluted fruit juice, and low-calorie sports drinks. You can drink milk also, if desired.  Avoid drinking fluids that contain a lot of sugar or caffeine, such as energy drinks, sports drinks, and soda.  Eat bland, easy-to-digest foods in small amounts as you are able. These foods include bananas, applesauce, rice, lean meats, toast, and crackers.  Avoid alcohol.  Avoid spicy or fatty foods.  Medicines  Take over-the-counter and prescription medicines only as told by your health care provider.  If you were prescribed an antibiotic medicine, take it as told by your health care provider. Do not stop using the antibiotic even if you start to feel better. General instructions   Wash your hands often using soap and water. If soap and water are not available, use a hand sanitizer. Others in the household should wash their hands as well. Hands should be washed: ? After using the toilet or changing a diaper. ? Before preparing, cooking, or serving food. ? While caring for a sick person or while visiting someone in a hospital.  Drink enough fluid to keep your urine  pale yellow.  Rest at home while you recover.  Watch your condition for any changes.  Take a warm bath to relieve any burning or pain from frequent diarrhea episodes.  Keep all follow-up visits as told by your health care provider. This is important. Contact a health care provider if:  You have a fever.  Your diarrhea gets worse.  You have new symptoms.  You cannot keep fluids down.  You feel light-headed or dizzy.  You have a headache.  You have muscle cramps. Get help right away if:  You have chest pain.  You feel extremely weak or you faint.  You have bloody or black stools or stools that look like tar.  You have severe pain, cramping, or bloating in your abdomen.  You have trouble breathing or you are breathing very quickly.  Your heart is beating very quickly.  Your skin feels cold and clammy.  You feel confused.  You have signs of dehydration, such as: ? Dark urine, very little urine, or no urine. ? Cracked lips. ? Dry mouth. ? Sunken eyes. ? Sleepiness. ? Weakness. Summary  Diarrhea is frequent loose and watery bowel movements. Diarrhea can make you feel weak and cause you to become dehydrated.  Drink enough fluids to keep your urine pale yellow.  Make sure that you wash your hands after using the toilet. If soap and water are not available, use hand sanitizer.  Contact a health care provider if your diarrhea gets worse or you have new symptoms.  Get help right away if you  have signs of dehydration. This information is not intended to replace advice given to you by your health care provider. Make sure you discuss any questions you have with your health care provider. Document Released: 11/30/2002 Document Revised: 05/16/2018 Document Reviewed: 05/16/2018 Elsevier Interactive Patient Education  2019 [employer].

## 2019-02-19 NOTE — Assessment & Plan Note (Addendum)
Stool testing was negative except some WBC, we did treated with Diflucan and she continued to have some loose stool so she stopped her meds and supplements for 2 weeks and then she added back Metformin and statin and lutein. Still has not returned and she is afraid it is her Losartan which she has not started back. She notes a new lutein and zeaxanthin was started and it might correlate with the diarrhea so she has stopped that again and it is better again.

## 2019-02-19 NOTE — Assessment & Plan Note (Signed)
hgba1c acceptable, minimize simple carbs. Increase exercise as tolerated.  

## 2019-02-19 NOTE — Progress Notes (Signed)
Established Patient Office Visit  Subjective:  Patient ID: Gail Lester, female    DOB: Dec 09, 1941  Age: 78 y.o. MRN: [id number]  CC: No chief complaint on file.   HPI Gail Lester presents for follow up. She is doing better now but had another flare of diarrhea that was over 5 loose stool daily. She experienced something similar a couple of years ago and stopped all meds and supplements when she did it stopped. We tested for infections and treated with Diflucan for WBC in her stool. She then quit everything again and finally the diarrhea has let up. She has now added back the Losartan and Metformin and is doing OK. No fevers or chills. No polyuria or polydipsia. Otherwise she feels well. No recent febrile illness or hospitalizations. Denies CP/palp/SOB/HA/congestion/fevers/GI or GU c/o. Taking meds as prescribed  Past Medical History:  Diagnosis Date  . Arthritis   . Chicken pox as a child  . Constipation 02/15/2017  . Cough 03/24/2015  . Diabetes ([place])   . Diabetes mellitus   . Diabetes mellitus type 2 in obese [facility]) 02/10/2013   Saw Dr Delman Cheadle, eye doctor in 01/01/13, exam in chart   . Diarrhea 02/10/2013  . Dupuytren's contracture of both hands 02/10/2013  . Encounter for Medicare annual wellness exam 08/04/2013  . Fibrocystic breast 02/10/2013   Left h/o FN biopsy   . Ganglion cyst 11/14/2014   Right 2nd finger  . Hypercholesteremia   . Keratosis, seborrheic 08/01/2015  . Knee pain, left 02/15/2017  . Left arm pain 11/14/2014  . Measles as a child  . Medicare annual wellness visit, subsequent 11/06/2015  . 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 Mumps as a child  . Osteopenia 05/23/2017  . Other and unspecified hyperlipidemia 02/10/2013  . Other malaise and fatigue 08/04/2013  . Overweight 06/14/2014  . Preventative health care 11/09/2016    Past Surgical History:  Procedure Laterality Date  . APPENDECTOMY  1955  . BREAST BIOPSY  1985-1990   benign  . ORIF WRIST FRACTURE   12/09/2011   Procedure: OPEN REDUCTION INTERNAL FIXATION (ORIF) WRIST FRACTURE;  Surgeon: Linna Hoff;  Location: [place];  Service: Orthopedics;  Laterality: Left;    Family History  Problem Relation Age of Onset  . Kidney disease Mother   . Diabetes Father   . Kidney disease Maternal Grandmother   . Cancer Maternal Grandmother        bladder  . Cancer Maternal Aunt   . Alzheimer's disease Maternal Aunt   . Scoliosis Son   . Colon cancer Unknown        grandparent    Social History   Socioeconomic History  . Marital status: Divorced    Spouse name: Not on file  . Number of children: Not on file  . Years of education: Not on file  . Highest education level: Not on file  Occupational History  . Not on file  Social Needs  . Financial resource strain: Not on file  . Food insecurity:    Worry: Not on file    Inability: Not on file  . Transportation needs:    Medical: Not on file    Non-medical: Not on file  Tobacco Use  . Smoking status: Former Smoker    Last attempt to quit: 11/17/1989    Years since quitting: 29.2  . Smokeless tobacco: Former Network engineer and Sexual Activity  . Alcohol use: No  . Drug use: No  .  Sexual activity: Not Currently  Lifestyle  . Physical activity:    Days per week: Not on file    Minutes per session: Not on file  . Stress: Not on file  Relationships  . Social connections:    Talks on phone: Not on file    Gets together: Not on file    Attends religious service: Not on file    Active member of club or organization: Not on file    Attends meetings of clubs or organizations: Not on file    Relationship status: Not on file  . Intimate partner violence:    Fear of current or ex partner: Not on file    Emotionally abused: Not on file    Physically abused: Not on file    Forced sexual activity: Not on file  Other Topics Concern  . Not on file  Social History Narrative  . Not on file    Outpatient Medications Prior to Visit    Medication Sig Dispense Refill  . ACCU-CHEK AVIVA PLUS test strip CHECK BLOOD SUGAR 4 TIMES  DAILY AND AS NEEDED FOR  LABILE BLOOD SUGARS 450 each 0  . aspirin 81 MG tablet Take 81 mg by mouth daily.    Marland Kitchen b complex vitamins tablet Take 1 tablet by mouth as directed. Two times a week    . Blood Glucose Monitoring Suppl (ACCU-CHEK AVIVA PLUS) W/DEVICE KIT Use as directed 1 kit 0  . CALCIUM PO Take 400 mg by mouth daily.    . carisoprodol (SOMA) 350 MG tablet Take 1 tablet (350 mg total) by mouth 2 (two) times daily as needed for muscle spasms. 20 tablet 1  . Cholecalciferol (D-3-5) 5000 UNITS capsule Take 4,000 Units by mouth daily.     . Coenzyme Q10 (COQ10) 100 MG CAPS Take 1 capsule by mouth daily.    . fluconazole (DIFLUCAN) 150 MG tablet Take 2 tablets po daily X 5days, then 2 tabs weekly X 2 weeks, then stop. Do not take Rosuvastatin when you you take this medication 14 tablet 0  . glimepiride (AMARYL) 4 MG tablet TAKE 1 TABLET BY MOUTH TWO  TIMES DAILY 180 tablet 0  . Krill Oil 500 MG CAPS Take 1 capsule by mouth daily.    Marland Kitchen losartan (COZAAR) 25 MG tablet TAKE 1 TABLET BY MOUTH  DAILY 90 tablet 1  . Lutein 20 MG CAPS Take by mouth.    Marland Kitchen MAGNESIUM PO Take 250 mg by mouth daily.     . metFORMIN (GLUCOPHAGE-XR) 500 MG 24 hr tablet TAKE 1 TABLET BY MOUTH 3  TIMES DAILY AS NEEDED 270 tablet 1  . Multiple Vitamin (MULTIVITAMIN) tablet Take 1 tablet by mouth as directed. Two times a week    . rosuvastatin (CRESTOR) 20 MG tablet TAKE ONE-HALF TABLET BY  MOUTH EVERY DAY 45 tablet 3   No facility-administered medications prior to visit.     Allergies  Allergen Reactions  . Ramipril Diarrhea  . Lipitor [Atorvastatin]     Diarrhea, muscle cramps  . Lisinopril     myalgias    ROS Review of Systems  Constitutional: Negative for fever.  HENT: Negative for congestion.   Respiratory: Negative for shortness of breath.   Cardiovascular: Negative for chest pain, palpitations and leg swelling.   Gastrointestinal: Positive for diarrhea. Negative for abdominal pain, blood in stool and nausea.  Genitourinary: Negative for dysuria and frequency.  Skin: Negative for rash.  Allergic/Immunologic: Negative for environmental allergies.  Neurological:  Negative for dizziness and headaches.  Psychiatric/Behavioral: The patient is not nervous/anxious.       Objective:    Physical Exam  Constitutional: She is oriented to person, place, and time. She appears well-developed and well-nourished. No distress.  HENT:  Head: Normocephalic and atraumatic.  Nose: Nose normal.  Eyes: Right eye exhibits no discharge. Left eye exhibits no discharge.  Neck: Normal range of motion. Neck supple.  Cardiovascular: Normal rate and regular rhythm.  No murmur heard. Pulmonary/Chest: Effort normal and breath sounds normal.  Abdominal: Soft. Bowel sounds are normal. There is no abdominal tenderness.  Musculoskeletal:        General: No edema.  Neurological: She is alert and oriented to person, place, and time.  Skin: Skin is warm and dry.  Psychiatric: She has a normal mood and affect.  Nursing note and vitals reviewed.   BP 124/64 (BP Location: Left Arm, Patient Position: Sitting, Cuff Size: Normal)   Pulse 62   Temp 97.7 F (36.5 C) (Oral)   Resp 18   Ht '5\' 4"'  (1.626 m)   Wt 153 lb (69.4 kg)   SpO2 96%   BMI 26.26 kg/m  Wt Readings from Last 3 Encounters:  02/19/19 153 lb (69.4 kg)  01/02/19 160 lb 6.4 oz (72.8 kg)  11/24/18 163 lb 9.6 oz (74.2 kg)     Health Maintenance Due  Topic Date Due  . FOOT EXAM  01/29/2017    There are no preventive care reminders to display for this patient.  Lab Results  Component Value Date   TSH 3.49 12/29/2018   Lab Results  Component Value Date   WBC 6.3 12/29/2018   HGB 13.3 12/29/2018   HCT 39.6 12/29/2018   MCV 88.9 12/29/2018   PLT 284.0 12/29/2018   Lab Results  Component Value Date   NA 140 12/29/2018   K 4.3 12/29/2018   CO2 29  12/29/2018   GLUCOSE 86 12/29/2018   BUN 13 12/29/2018   CREATININE 0.68 12/29/2018   BILITOT 0.4 12/29/2018   ALKPHOS 48 12/29/2018   AST 19 12/29/2018   ALT 18 12/29/2018   PROT 6.3 12/29/2018   ALBUMIN 3.9 12/29/2018   CALCIUM 9.3 12/29/2018   GFR 89.06 12/29/2018   Lab Results  Component Value Date   CHOL 169 12/29/2018   Lab Results  Component Value Date   HDL 43.70 12/29/2018   Lab Results  Component Value Date   LDLCALC 95 12/29/2018   Lab Results  Component Value Date   TRIG 149.0 12/29/2018   Lab Results  Component Value Date   CHOLHDL 4 12/29/2018   Lab Results  Component Value Date   HGBA1C 7.6 (H) 12/29/2018      Assessment & Plan:   Problem List Items Addressed This Visit    Diabetes mellitus type 2 in obese ([place])    hgba1c acceptable, minimize simple carbs. Increase exercise as tolerated.       Hyperlipidemia, mixed    Tolerating statin, encouraged heart healthy diet, avoid trans fats, minimize simple carbs and saturated fats. Increase exercise as tolerated      Diarrhea    Stool testing was negative except some WBC, we did treated with Diflucan and she continued to have some loose stool so she stopped her meds and supplements for 2 weeks and then she added back Metformin and statin and lutein. Still has not returned and she is afraid it is her Losartan which she has not started back. She  notes a new lutein and zeaxanthin was started and it might correlate with the diarrhea so she has stopped that again and it is better again.       Overweight    Has started with weight watchers and has lost weight since her last visit.       Osteopenia    Encouraged to get adequate exercise, calcium and vitamin d intake         No orders of the defined types were placed in this encounter.   Follow-up: No follow-ups on file.    Penni Homans, MD

## 2019-02-19 NOTE — Assessment & Plan Note (Signed)
Has started with weight watchers and has lost weight since her last visit.

## 2019-03-31 ENCOUNTER — Encounter: Payer: Medicare Other | Admitting: Family Medicine

## 2019-04-07 ENCOUNTER — Encounter: Payer: Medicare Other | Admitting: Family Medicine

## 2019-04-18 ENCOUNTER — Other Ambulatory Visit: Payer: Self-pay | Admitting: Family Medicine

## 2019-04-24 HISTORY — PX: EYE SURGERY: SHX253

## 2019-06-14 ENCOUNTER — Other Ambulatory Visit: Payer: Self-pay | Admitting: Family Medicine

## 2019-06-14 DIAGNOSIS — E1169 Type 2 diabetes mellitus with other specified complication: Secondary | ICD-10-CM

## 2019-07-05 ENCOUNTER — Other Ambulatory Visit: Payer: Self-pay | Admitting: Family Medicine

## 2019-07-06 ENCOUNTER — Encounter: Payer: Medicare Other | Admitting: Family Medicine

## 2019-08-28 ENCOUNTER — Other Ambulatory Visit: Payer: Self-pay

## 2019-08-28 ENCOUNTER — Ambulatory Visit (INDEPENDENT_AMBULATORY_CARE_PROVIDER_SITE_OTHER): Payer: Medicare Other

## 2019-08-28 DIAGNOSIS — Z23 Encounter for immunization: Secondary | ICD-10-CM | POA: Diagnosis not present

## 2019-09-07 ENCOUNTER — Other Ambulatory Visit: Payer: Self-pay | Admitting: Family Medicine

## 2019-09-08 ENCOUNTER — Other Ambulatory Visit: Payer: Self-pay

## 2019-09-08 ENCOUNTER — Ambulatory Visit (HOSPITAL_BASED_OUTPATIENT_CLINIC_OR_DEPARTMENT_OTHER)
Admission: RE | Admit: 2019-09-08 | Discharge: 2019-09-08 | Disposition: A | Discharge status: Home / Self Care | Payer: Medicare Other | Source: Ambulatory Visit | Attending: Medical | Admitting: Medical

## 2019-09-08 ENCOUNTER — Ambulatory Visit (INDEPENDENT_AMBULATORY_CARE_PROVIDER_SITE_OTHER): Payer: Medicare Other | Admitting: Medical

## 2019-09-08 ENCOUNTER — Encounter: Payer: Self-pay | Admitting: Medical

## 2019-09-08 VITALS — BP 144/49 | HR 72 | Temp 98.0°F | Resp 16 | Ht 64.0 in | Wt 139.4 lb

## 2019-09-08 DIAGNOSIS — S40022A Contusion of left upper arm, initial encounter: Secondary | ICD-10-CM

## 2019-09-08 DIAGNOSIS — S7012XA Contusion of left thigh, initial encounter: Secondary | ICD-10-CM

## 2019-09-08 DIAGNOSIS — M533 Sacrococcygeal disorders, not elsewhere classified: Secondary | ICD-10-CM | POA: Diagnosis not present

## 2019-09-08 DIAGNOSIS — T148XXA Other injury of unspecified body region, initial encounter: Secondary | ICD-10-CM

## 2019-09-08 DIAGNOSIS — S3210XA Unspecified fracture of sacrum, initial encounter for closed fracture: Secondary | ICD-10-CM | POA: Diagnosis not present

## 2019-09-08 LAB — CBC WITH DIFFERENTIAL/PLATELET
Basophils Absolute: 0 10*3/uL (ref 0.0–0.1)
Basophils Relative: 0.4 % (ref 0.0–3.0)
Eosinophils Absolute: 0.3 10*3/uL (ref 0.0–0.7)
Eosinophils Relative: 4.1 % (ref 0.0–5.0)
HCT: 37.2 % (ref 36.0–46.0)
Hemoglobin: 12.2 g/dL (ref 12.0–15.0)
Lymphocytes Relative: 27.7 % (ref 12.0–46.0)
Lymphs Abs: 2 10*3/uL (ref 0.7–4.0)
MCHC: 32.8 g/dL (ref 30.0–36.0)
MCV: 90.9 fl (ref 78.0–100.0)
Monocytes Absolute: 0.6 10*3/uL (ref 0.1–1.0)
Monocytes Relative: 7.7 % (ref 3.0–12.0)
Neutro Abs: 4.4 10*3/uL (ref 1.4–7.7)
Neutrophils Relative %: 60.1 % (ref 43.0–77.0)
Platelets: 259 10*3/uL (ref 150.0–400.0)
RBC: 4.09 Mil/uL (ref 3.87–5.11)
RDW: 13 % (ref 11.5–15.5)
WBC: 7.3 10*3/uL (ref 4.0–10.5)

## 2019-09-08 NOTE — Patient Instructions (Signed)
You do have various areas of bruising after a fall on Friday.  However your physical exam and recent symptoms only indicate need for x-ray of coccyx presently.  You did report history of bumping your head but you are already 4 days out and have very good neurologic exam.  So imaging studies of head not indicated.  Would advise her to use ibuprofen 400 to 600 mg every 8 hours.  You could supplement that with low-dose Tylenol if not adequate pain control for coccyx area.  If you do have coccyx fracture and ibuprofen/Tylenol regimen not adequate then will give you low-dose pain medication.  You do have some moderate to severe your extensive bruising in 3 areas.  I do think is a good idea for you to get a CBC so we can evaluate your platelets.  Follow-up in 7 to 10 days or as needed.

## 2019-09-08 NOTE — Progress Notes (Signed)
Subjective:    Patient ID: Gail Lester, female    DOB: 26-May-1941, 78 y.o.   MRN: [id number]  HPI  Pt states last Friday she fell going down some stairs. She fell down one step. She state she fell into corner of room with furniture. She states her tail bone hurts on and off. She has lower abdomen bruise, left thigh, and left tricep. But no pain in area except faint diffuse pain abdomen only when twists thorax or leans to grab things.  On review pt has normal platlets 8 months ago.  3 bumps on her head. No loc. 2 bumps went down. One bump sore. After fall had no nausea, no vomiting, no blurred vision, no nausea. No gross motor or sensory function deficits.   Pt had diffuse lower abdomen wall pain when twist thorax or when he leans forward.  Pt states Monday noticed tip of coccyx pain.  Pt has taken ibuprofen for pain. Will take 600 mg every 3-4 hours.     Review of Systems  Constitutional: Negative for chills, fatigue and fever.  Respiratory: Negative for cough, chest tightness, shortness of breath and wheezing.   Cardiovascular: Negative for chest pain and palpitations.  Gastrointestinal:       See abdomen. Exam and hpi.  Neurological: Negative for dizziness, tremors, facial asymmetry, speech difficulty, weakness and light-headedness.  Hematological: Negative for adenopathy. Does not bruise/bleed easily.  Psychiatric/Behavioral: Negative for behavioral problems and confusion.    Past Medical History:  Diagnosis Date  . Arthritis   . Chicken pox as a child  . Constipation 02/15/2017  . Cough 03/24/2015  . Diabetes ([place])   . Diabetes mellitus   . Diabetes mellitus type 2 in obese [facility]) 02/10/2013   Saw Dr Delman Cheadle, eye doctor in 01/01/13, exam in chart   . Diarrhea 02/10/2013  . Dupuytren's contracture of both hands 02/10/2013  . Encounter for Medicare annual wellness exam 08/04/2013  . Fibrocystic breast 02/10/2013   Left h/o FN biopsy   . Ganglion cyst 11/14/2014   Right 2nd  finger  . Hypercholesteremia   . Keratosis, seborrheic 08/01/2015  . Knee pain, left 02/15/2017  . Left arm pain 11/14/2014  . Measles as a child  . Medicare annual wellness visit, subsequent 11/06/2015  . 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 Mumps as a child  . Osteopenia 05/23/2017  . Other and unspecified hyperlipidemia 02/10/2013  . Other malaise and fatigue 08/04/2013  . Overweight 06/14/2014  . Preventative health care 11/09/2016     Social History   Socioeconomic History  . Marital status: Divorced    Spouse name: Not on file  . Number of children: Not on file  . Years of education: Not on file  . Highest education level: Not on file  Occupational History  . Not on file  Social Needs  . Financial resource strain: Not on file  . Food insecurity    Worry: Not on file    Inability: Not on file  . Transportation needs    Medical: Not on file    Non-medical: Not on file  Tobacco Use  . Smoking status: Former Smoker    Quit date: 11/17/1989    Years since quitting: 29.8  . Smokeless tobacco: Former Network engineer and Sexual Activity  . Alcohol use: No  . Drug use: No  . Sexual activity: Not Currently  Lifestyle  . Physical activity    Days per week: Not on file  Minutes per session: Not on file  . Stress: Not on file  Relationships  . Social Herbalist on phone: Not on file    Gets together: Not on file    Attends religious service: Not on file    Active member of club or organization: Not on file    Attends meetings of clubs or organizations: Not on file    Relationship status: Not on file  . Intimate partner violence    Fear of current or ex partner: Not on file    Emotionally abused: Not on file    Physically abused: Not on file    Forced sexual activity: Not on file  Other Topics Concern  . Not on file  Social History Narrative  . Not on file    Past Surgical History:  Procedure Laterality Date  . APPENDECTOMY  1955  . BREAST  BIOPSY  1985-1990   benign  . ORIF WRIST FRACTURE  12/09/2011   Procedure: OPEN REDUCTION INTERNAL FIXATION (ORIF) WRIST FRACTURE;  Surgeon: Linna Hoff;  Location: [place];  Service: Orthopedics;  Laterality: Left;    Family History  Problem Relation Age of Onset  . Kidney disease Mother   . Diabetes Father   . Kidney disease Maternal Grandmother   . Cancer Maternal Grandmother        bladder  . Cancer Maternal Aunt   . Alzheimer's disease Maternal Aunt   . Scoliosis Son   . Colon cancer Unknown        grandparent    Allergies  Allergen Reactions  . Ramipril Diarrhea  . Lipitor [Atorvastatin]     Diarrhea, muscle cramps  . Lisinopril     myalgias    Current Outpatient Medications on File Prior to Visit  Medication Sig Dispense Refill  . ACCU-CHEK AVIVA PLUS test strip CHECK BLOOD SUGAR 4 TIMES  DAILY AND AS NEEDED FOR  LABILE BLOOD SUGARS 100 each 11  . aspirin 81 MG tablet Take 81 mg by mouth daily.    Marland Kitchen b complex vitamins tablet Take 1 tablet by mouth as directed. Two times a week    . Blood Glucose Monitoring Suppl (ACCU-CHEK AVIVA PLUS) W/DEVICE KIT Use as directed 1 kit 0  . CALCIUM PO Take 400 mg by mouth daily.    . carisoprodol (SOMA) 350 MG tablet Take 1 tablet (350 mg total) by mouth 2 (two) times daily as needed for muscle spasms. 20 tablet 1  . Cholecalciferol (D-3-5) 5000 UNITS capsule Take 4,000 Units by mouth daily.     . Coenzyme Q10 (COQ10) 100 MG CAPS Take 1 capsule by mouth daily.    . fluconazole (DIFLUCAN) 150 MG tablet Take 2 tablets po daily X 5days, then 2 tabs weekly X 2 weeks, then stop. Do not take Rosuvastatin when you you take this medication 14 tablet 0  . glimepiride (AMARYL) 4 MG tablet TAKE 1 TABLET BY MOUTH  TWICE DAILY 180 tablet 3  . Krill Oil 500 MG CAPS Take 1 capsule by mouth daily.    Marland Kitchen losartan (COZAAR) 25 MG tablet TAKE 1 TABLET BY MOUTH  DAILY 90 tablet 1  . Lutein 20 MG CAPS Take by mouth.    Marland Kitchen MAGNESIUM PO Take 250 mg by mouth  daily.     . metFORMIN (GLUCOPHAGE-XR) 500 MG 24 hr tablet TAKE 1 TABLET BY MOUTH 3  TIMES DAILY AS NEEDED 270 tablet 1  . Multiple Vitamin (MULTIVITAMIN) tablet Take 1  tablet by mouth as directed. Two times a week    . rosuvastatin (CRESTOR) 20 MG tablet TAKE ONE-HALF TABLET BY  MOUTH EVERY DAY 45 tablet 3   No current facility-administered medications on file prior to visit.     BP (!) 144/49   Pulse 72   Temp 98 F (36.7 C) (Temporal)   Resp 16   Ht _0  (1.626 m)   Wt 139 lb 6.4 oz (63.2 kg)   SpO2 99%   BMI 23.93 kg/m       Objective:   Physical Exam  General Mental Status- Alert. General Appearance- Not in acute distress.   heent- negative. No battle signs around orbits.  Skin General: Color- Normal Color. Moisture- Normal Moisture.  Neck Carotid Arteries- Normal color. Moisture- Normal Moisture. No carotid bruits. No JVD.no mid cervical spine tenderness.  Chest and Lung Exam Auscultation: Breath Sounds:-Normal.  Cardiovascular Auscultation:Rythm- Regular. Murmurs & Other Heart Sounds:Auscultation of the heart reveals- No Murmurs.  Abdomen Inspection:-Inspeection Normal. Palpation/Percussion:Note:No mass. Palpation and Percussion of the abdomen reveal- Non Tender, Non Distended + BS, no rebound or guarding. No pain in luq. Left side level of umbilicus 8 cm x 3 cm bruise approximate.    Neurologic Cranial Nerve exam:- CN III-XII intact(No nystagmus), symmetric smile. Drift Test:- No drift. Finger to Nose:- Normal/Intact Strength:- 5/5 equal and symmetric strength both upper and lower extremities.   Hips- no pain on rom of hips. No pain  In pelvis on direct palpation/pressure and anterior superior iliac spines.  Left thigh- 8 cm x 3 cm bruisng approximate. Thigh not tdner to palpation.  Left upper ext- no pain on palpation or rom. 8 cm x 3 cm approximate bruise tricep area.  Back- no lspine pain. But direct pain to palpation tip of coccyx. On  inspection. No bruising in this area.  Head- normocephalic. No obvious trauma. On palpation I don't feel bumps/contusion present. Though small mild tender area which pt points out near crown of head just rt of midline.        Assessment & Plan:  You do have various areas of bruising after a fall on Friday.  However your physical exam and recent symptoms only indicate need for x-ray of coccyx presently.  You did report history of bumping your head but you are already 4 days out and have very good neurologic exam.  So imaging studies of head not indicated.  Would advise her to use ibuprofen 400 to 600 mg every 8 hours.  You could supplement that with low-dose Tylenol if not adequate pain control for coccyx area.  If you do have coccyx fracture and ibuprofen/Tylenol regimen not adequate then will give you low-dose pain medication.  You do have some moderate to severe your extensive bruising in 3 areas.  I do think is a good idea for you to get a CBC so we can evaluate your platelets.  Follow-up in 7 to 10 days or as needed.  25 minutes spent with pt. 50% of time spent with pt discussing tx plan.

## 2019-10-05 ENCOUNTER — Ambulatory Visit (INDEPENDENT_AMBULATORY_CARE_PROVIDER_SITE_OTHER): Payer: Medicare Other | Admitting: Family Medicine

## 2019-10-05 ENCOUNTER — Other Ambulatory Visit: Payer: Self-pay

## 2019-10-05 ENCOUNTER — Encounter: Payer: Self-pay | Admitting: Family Medicine

## 2019-10-05 VITALS — BP 120/64 | HR 76 | Temp 98.2°F | Resp 16 | Ht 64.0 in | Wt 159.4 lb

## 2019-10-05 DIAGNOSIS — R252 Cramp and spasm: Secondary | ICD-10-CM | POA: Diagnosis not present

## 2019-10-05 DIAGNOSIS — E1169 Type 2 diabetes mellitus with other specified complication: Secondary | ICD-10-CM

## 2019-10-05 DIAGNOSIS — E782 Mixed hyperlipidemia: Secondary | ICD-10-CM

## 2019-10-05 DIAGNOSIS — E669 Obesity, unspecified: Secondary | ICD-10-CM | POA: Diagnosis not present

## 2019-10-05 DIAGNOSIS — G3184 Mild cognitive impairment, so stated: Secondary | ICD-10-CM

## 2019-10-05 DIAGNOSIS — Z0000 Encounter for general adult medical examination without abnormal findings: Secondary | ICD-10-CM

## 2019-10-05 DIAGNOSIS — M8580 Other specified disorders of bone density and structure, unspecified site: Secondary | ICD-10-CM

## 2019-10-05 DIAGNOSIS — Z23 Encounter for immunization: Secondary | ICD-10-CM | POA: Diagnosis not present

## 2019-10-05 MED ORDER — TETANUS-DIPHTHERIA TOXOIDS TD 5-2 LFU IM INJ: 0.5000 mL | INJECTION | Freq: Once | INTRAMUSCULAR | 0 refills | Status: AC

## 2019-10-05 NOTE — Assessment & Plan Note (Signed)
Encouraged heart healthy diet, increase exercise, avoid trans fats, consider a krill oil cap daily 

## 2019-10-05 NOTE — Assessment & Plan Note (Signed)
Patient encouraged to maintain heart healthy diet, regular exercise, adequate sleep. Consider daily probiotics. Take medications as prescribed 

## 2019-10-05 NOTE — Assessment & Plan Note (Addendum)
She is offered a neurology consultation but she declines for now. She will let us know if she is ready for referal

## 2019-10-05 NOTE — Patient Instructions (Addendum)
Drop the Metformin 500 mg tabs to 1/2 tab daily, report if numbers keep dropping  Pulse Oximeter want oxygen levels in 90s   Omron blood pressure cuff, upper arm,    Preventive Care 65 Years and Older, Female Preventive care refers to lifestyle choices and visits with your health care provider that can promote health and wellness. This includes:  A yearly physical exam. This is also called an annual well check.  Regular dental and eye exams.  Immunizations.  Screening for certain conditions.  Healthy lifestyle choices, such as diet and exercise. What can I expect for my preventive care visit? Physical exam Your health care provider will check:  Height and weight. These may be used to calculate body mass index (BMI), which is a measurement that tells if you are at a healthy weight.  Heart rate and blood pressure.  Your skin for abnormal spots. Counseling Your health care provider may ask you questions about:  Alcohol, tobacco, and drug use.  Emotional well-being.  Home and relationship well-being.  Sexual activity.  Eating habits.  History of falls.  Memory and ability to understand (cognition).  Work and work Statistician.  Pregnancy and menstrual history. What immunizations do I need?  Influenza (flu) vaccine  This is recommended every year. Tetanus, diphtheria, and pertussis (Tdap) vaccine  You may need a Td booster every 10 years. Varicella (chickenpox) vaccine  You may need this vaccine if you have not already been vaccinated. Zoster (shingles) vaccine  You may need this after age 23. Pneumococcal conjugate (PCV13) vaccine  One dose is recommended after age 66. Pneumococcal polysaccharide (PPSV23) vaccine  One dose is recommended after age 25. Measles, mumps, and rubella (MMR) vaccine  You may need at least one dose of MMR if you were born in 1957 or later. You may also need a second dose. Meningococcal conjugate (MenACWY) vaccine  You may  need this if you have certain conditions. Hepatitis A vaccine  You may need this if you have certain conditions or if you travel or work in places where you may be exposed to hepatitis A. Hepatitis B vaccine  You may need this if you have certain conditions or if you travel or work in places where you may be exposed to hepatitis B. Haemophilus influenzae type b (Hib) vaccine  You may need this if you have certain conditions. You may receive vaccines as individual doses or as more than one vaccine together in one shot (combination vaccines). Talk with your health care provider about the risks and benefits of combination vaccines. What tests do I need? Blood tests  Lipid and cholesterol levels. These may be checked every 5 years, or more frequently depending on your overall health.  Hepatitis C test.  Hepatitis B test. Screening  Lung cancer screening. You may have this screening every year starting at age 78 if you have a 30-pack-year history of smoking and currently smoke or have quit within the past 15 years.  Colorectal cancer screening. All adults should have this screening starting at age 63 and continuing until age 41. Your health care provider may recommend screening at age 47 if you are at increased risk. You will have tests every 1-10 years, depending on your results and the type of screening test.  Diabetes screening. This is done by checking your blood sugar (glucose) after you have not eaten for a while (fasting). You may have this done every 1-3 years.  Mammogram. This may be done every 1-2 years. Talk  with your health care provider about how often you should have regular mammograms.  BRCA-related cancer screening. This may be done if you have a family history of breast, ovarian, tubal, or peritoneal cancers. Other tests  Sexually transmitted disease (STD) testing.  Bone density scan. This is done to screen for osteoporosis. You may have this done starting at age 78.  Follow these instructions at home: Eating and drinking  Eat a diet that includes fresh fruits and vegetables, whole grains, lean protein, and low-fat dairy products. Limit your intake of foods with high amounts of sugar, saturated fats, and salt.  Take vitamin and mineral supplements as recommended by your health care provider.  Do not drink alcohol if your health care provider tells you not to drink.  If you drink alcohol: ? Limit how much you have to 0-1 drink a day. ? Be aware of how much alcohol is in your drink. In the [place] one drink equals one 12 oz bottle of beer (355 mL), one 5 oz glass of wine (148 mL), or one 1 oz glass of hard liquor (44 mL). Lifestyle  Take daily care of your teeth and gums.  Stay active. Exercise for at least 30 minutes on 5 or more days each week.  Do not use any products that contain nicotine or tobacco, such as cigarettes, e-cigarettes, and chewing tobacco. If you need help quitting, ask your health care provider.  If you are sexually active, practice safe sex. Use a condom or other form of protection in order to prevent STIs (sexually transmitted infections).  Talk with your health care provider about taking a low-dose aspirin or statin. What's next?  Go to your health care provider once a year for a well check visit.  Ask your health care provider how often you should have your eyes and teeth checked.  Stay up to date on all vaccines. This information is not intended to replace advice given to you by your health care provider. Make sure you discuss any questions you have with your health care provider. Document Released: 01/06/2016 Document Revised: 12/04/2018 Document Reviewed: 12/04/2018 Elsevier Patient Education  2020 [employer].

## 2019-10-05 NOTE — Assessment & Plan Note (Signed)
hgba1c acceptable, minimize simple carbs. Increase exercise as tolerated. Continue current meds. She has had numerous sugars below 70 with Metformin 500 mg daily, will try to drop the Metformin to 250 mg daily and she will report any concerns

## 2019-10-05 NOTE — Progress Notes (Signed)
Subjective:    Patient ID: Gail Lester, female    DOB: 08-23-1941, 78 y.o.   MRN: [id number]  Chief Complaint  Patient presents with  . Annual Exam    Not fasting. Had fall x1 month ago. Continues to have pain in the coccyx area.     HPI Patient is in today for annual preventative exam and follow up on chronic medical concerns including diabetes, arthritis and more. No recent febrile illness or hospitalizations. No polyuria or polydipsia. She is eating well and staying active. Her blood sugars have been well controlled and even below 70 at times which makes her feel poorly. She is maintaining quarantine well. Denies CP/palp/SOB/HA/congestion/fevers/GI or GU c/o. Taking meds as prescribed  Past Medical History:  Diagnosis Date  . Arthritis   . Chicken pox as a child  . Constipation 02/15/2017  . Cough 03/24/2015  . Diabetes ([place])   . Diabetes mellitus   . Diabetes mellitus type 2 in obese [facility]) 02/10/2013   Saw Dr Delman Cheadle, eye doctor in 01/01/13, exam in chart   . Diarrhea 02/10/2013  . Dupuytren's contracture of both hands 02/10/2013  . Encounter for Medicare annual wellness exam 08/04/2013  . Fibrocystic breast 02/10/2013   Left h/o FN biopsy   . Ganglion cyst 11/14/2014   Right 2nd finger  . Hypercholesteremia   . Keratosis, seborrheic 08/01/2015  . Knee pain, left 02/15/2017  . Left arm pain 11/14/2014  . Measles as a child  . Medicare annual wellness visit, subsequent 11/06/2015  . 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 Mumps as a child  . Osteopenia 05/23/2017  . Other and unspecified hyperlipidemia 02/10/2013  . Other malaise and fatigue 08/04/2013  . Overweight 06/14/2014  . Preventative health care 11/09/2016    Past Surgical History:  Procedure Laterality Date  . APPENDECTOMY  1955  . BREAST BIOPSY  1985-1990   benign  . ORIF WRIST FRACTURE  12/09/2011   Procedure: OPEN REDUCTION INTERNAL FIXATION (ORIF) WRIST FRACTURE;  Surgeon: Linna Hoff;  Location: [facility];   Service: Orthopedics;  Laterality: Left;    Family History  Problem Relation Age of Onset  . Kidney disease Mother   . Diabetes Father   . Kidney disease Maternal Grandmother   . Cancer Maternal Grandmother        bladder  . Cancer Maternal Aunt   . Alzheimer's disease Maternal Aunt   . Scoliosis Son   . Colon cancer Unknown        grandparent    Social History   Socioeconomic History  . Marital status: Divorced    Spouse name: Not on file  . Number of children: Not on file  . Years of education: Not on file  . Highest education level: Not on file  Occupational History  . Not on file  Social Needs  . Financial resource strain: Not on file  . Food insecurity    Worry: Not on file    Inability: Not on file  . Transportation needs    Medical: Not on file    Non-medical: Not on file  Tobacco Use  . Smoking status: Former Smoker    Quit date: 11/17/1989    Years since quitting: 29.9  . Smokeless tobacco: Former Network engineer and Sexual Activity  . Alcohol use: No  . Drug use: No  . Sexual activity: Not Currently  Lifestyle  . Physical activity    Days per week: Not on file  Minutes per session: Not on file  . Stress: Not on file  Relationships  . Social Herbalist on phone: Not on file    Gets together: Not on file    Attends religious service: Not on file    Active member of club or organization: Not on file    Attends meetings of clubs or organizations: Not on file    Relationship status: Not on file  . Intimate partner violence    Fear of current or ex partner: Not on file    Emotionally abused: Not on file    Physically abused: Not on file    Forced sexual activity: Not on file  Other Topics Concern  . Not on file  Social History Narrative  . Not on file    Outpatient Medications Prior to Visit  Medication Sig Dispense Refill  . ACCU-CHEK AVIVA PLUS test strip CHECK BLOOD SUGAR 4 TIMES  DAILY AND AS NEEDED FOR  LABILE BLOOD SUGARS 100  each 11  . aspirin 81 MG tablet Take 81 mg by mouth daily.    Marland Kitchen b complex vitamins tablet Take 1 tablet by mouth as directed. Two times a week    . Blood Glucose Monitoring Suppl (ACCU-CHEK AVIVA PLUS) W/DEVICE KIT Use as directed 1 kit 0  . CALCIUM PO Take 400 mg by mouth daily.    . carisoprodol (SOMA) 350 MG tablet Take 1 tablet (350 mg total) by mouth 2 (two) times daily as needed for muscle spasms. 20 tablet 1  . Cholecalciferol (D-3-5) 5000 UNITS capsule Take 4,000 Units by mouth daily.     . Coenzyme Q10 (COQ10) 100 MG CAPS Take 1 capsule by mouth daily.    Marland Kitchen glimepiride (AMARYL) 4 MG tablet TAKE 1 TABLET BY MOUTH  TWICE DAILY 180 tablet 3  . Krill Oil 500 MG CAPS Take 1 capsule by mouth daily.    Marland Kitchen losartan (COZAAR) 25 MG tablet TAKE 1 TABLET BY MOUTH  DAILY 90 tablet 1  . Lutein 20 MG CAPS Take by mouth.    Marland Kitchen MAGNESIUM PO Take 250 mg by mouth daily.     . metFORMIN (GLUCOPHAGE-XR) 500 MG 24 hr tablet TAKE 1 TABLET BY MOUTH 3  TIMES DAILY AS NEEDED 270 tablet 1  . Multiple Vitamin (MULTIVITAMIN) tablet Take 1 tablet by mouth as directed. Two times a week    . rosuvastatin (CRESTOR) 20 MG tablet TAKE ONE-HALF TABLET BY  MOUTH EVERY DAY 45 tablet 3  . fluconazole (DIFLUCAN) 150 MG tablet Take 2 tablets po daily X 5days, then 2 tabs weekly X 2 weeks, then stop. Do not take Rosuvastatin when you you take this medication (Patient not taking: Reported on 10/05/2019) 14 tablet 0   No facility-administered medications prior to visit.     Allergies  Allergen Reactions  . Ramipril Diarrhea  . Lipitor [Atorvastatin]     Diarrhea, muscle cramps  . Lisinopril     myalgias    Review of Systems  Constitutional: Negative for chills, fever and malaise/fatigue.  HENT: Negative for congestion and hearing loss.   Eyes: Negative for discharge.  Respiratory: Negative for cough, sputum production and shortness of breath.   Cardiovascular: Negative for chest pain, palpitations and leg swelling.   Gastrointestinal: Negative for abdominal pain, blood in stool, constipation, diarrhea, heartburn, nausea and vomiting.  Genitourinary: Negative for dysuria, frequency, hematuria and urgency.  Musculoskeletal: Positive for myalgias. Negative for back pain and falls.  Skin: Negative  for rash.  Neurological: Negative for dizziness, sensory change, loss of consciousness, weakness and headaches.  Endo/Heme/Allergies: Negative for environmental allergies. Does not bruise/bleed easily.  Psychiatric/Behavioral: Positive for memory loss. Negative for depression and suicidal ideas. The patient is not nervous/anxious and does not have insomnia.        Objective:    Physical Exam Constitutional:      General: She is not in acute distress.    Appearance: She is not diaphoretic.  HENT:     Head: Normocephalic and atraumatic.     Right Ear: External ear normal.     Left Ear: External ear normal.     Nose: Nose normal.     Mouth/Throat:     Pharynx: No oropharyngeal exudate.  Eyes:     General: No scleral icterus.       Right eye: No discharge.        Left eye: No discharge.     Conjunctiva/sclera: Conjunctivae normal.     Pupils: Pupils are equal, round, and reactive to light.  Neck:     Musculoskeletal: Normal range of motion and neck supple.     Thyroid: No thyromegaly.  Cardiovascular:     Rate and Rhythm: Normal rate and regular rhythm.     Heart sounds: Normal heart sounds. No murmur.  Pulmonary:     Effort: Pulmonary effort is normal. No respiratory distress.     Breath sounds: Normal breath sounds. No wheezing or rales.  Abdominal:     General: Bowel sounds are normal. There is no distension.     Palpations: Abdomen is soft. There is no mass.     Tenderness: There is no abdominal tenderness.  Musculoskeletal: Normal range of motion.        General: No tenderness.  Lymphadenopathy:     Cervical: No cervical adenopathy.  Skin:    General: Skin is warm and dry.     Findings: No  rash.  Neurological:     Mental Status: She is alert and oriented to person, place, and time.     Cranial Nerves: No cranial nerve deficit.     Coordination: Coordination normal.     Deep Tendon Reflexes: Reflexes are normal and symmetric. Reflexes normal.     BP 120/64 (BP Location: Right Arm, Patient Position: Sitting, Cuff Size: Normal)   Pulse 76   Temp 98.2 F (36.8 C) (Oral)   Resp 16   Ht '5\' 4"'$  (1.626 m)   Wt 159 lb 6 oz (72.3 kg)   SpO2 98%   BMI 27.36 kg/m  Wt Readings from Last 3 Encounters:  10/05/19 159 lb 6 oz (72.3 kg)  09/08/19 139 lb 6.4 oz (63.2 kg)  02/19/19 153 lb (69.4 kg)    Diabetic Foot Exam - Simple   No data filed     Lab Results  Component Value Date   WBC 7.3 09/08/2019   HGB 12.2 09/08/2019   HCT 37.2 09/08/2019   PLT 259.0 09/08/2019   GLUCOSE 121 (H) 02/19/2019   CHOL 169 12/29/2018   TRIG 149.0 12/29/2018   HDL 43.70 12/29/2018   LDLDIRECT 74.0 05/23/2017   LDLCALC 95 12/29/2018   ALT 15 02/19/2019   AST 19 02/19/2019   NA 141 02/19/2019   K 4.2 02/19/2019   CL 105 02/19/2019   CREATININE 0.66 02/19/2019   BUN 14 02/19/2019   CO2 27 02/19/2019   TSH 3.49 12/29/2018   HGBA1C 7.6 (H) 12/29/2018   MICROALBUR 1.6 11/03/2015  Lab Results  Component Value Date   TSH 3.49 12/29/2018   Lab Results  Component Value Date   WBC 7.3 09/08/2019   HGB 12.2 09/08/2019   HCT 37.2 09/08/2019   MCV 90.9 09/08/2019   PLT 259.0 09/08/2019   Lab Results  Component Value Date   NA 141 02/19/2019   K 4.2 02/19/2019   CO2 27 02/19/2019   GLUCOSE 121 (H) 02/19/2019   BUN 14 02/19/2019   CREATININE 0.66 02/19/2019   BILITOT 0.5 02/19/2019   ALKPHOS 56 02/19/2019   AST 19 02/19/2019   ALT 15 02/19/2019   PROT 6.6 02/19/2019   ALBUMIN 4.2 02/19/2019   CALCIUM 9.4 02/19/2019   GFR 86.70 02/19/2019   Lab Results  Component Value Date   CHOL 169 12/29/2018   Lab Results  Component Value Date   HDL 43.70 12/29/2018   Lab  Results  Component Value Date   LDLCALC 95 12/29/2018   Lab Results  Component Value Date   TRIG 149.0 12/29/2018   Lab Results  Component Value Date   CHOLHDL 4 12/29/2018   Lab Results  Component Value Date   HGBA1C 7.6 (H) 12/29/2018       Assessment & Plan:   Problem List Items Addressed This Visit    Diabetes mellitus type 2 in obese ([place])    hgba1c acceptable, minimize simple carbs. Increase exercise as tolerated. Continue current meds. She has had numerous sugars below 70 with Metformin 500 mg daily, will try to drop the Metformin to 250 mg daily and she will report any concerns      Relevant Orders   TSH   Hyperlipidemia, mixed - Primary    Encouraged heart healthy diet, increase exercise, avoid trans fats, consider a krill oil cap daily      Relevant Orders   Lipid panel   Hemoglobin A1c   Comprehensive metabolic panel   Preventative health care    Patient encouraged to maintain heart healthy diet, regular exercise, adequate sleep. Consider daily probiotics. Take medications as prescribed      Osteopenia    Encouraged to get adequate exercise, calcium and vitamin d intake      Muscle cramps   Relevant Orders   TSH   CBC   Mild cognitive impairment    She is offered a neurology consultation but she declines for now. She will let us know if she is ready for referal        Other Visit Diagnoses    Need for tetanus booster       Relevant Medications   tetanus & diphtheria toxoids, adult, (TENIVAC) 5-2 LFU injection      I have discontinued Tailor T. Sutherland's fluconazole. I am also having her start on tetanus & diphtheria toxoids (adult). Additionally, I am having her maintain her aspirin, Cholecalciferol, Lutein, b complex vitamins, multivitamin, MAGNESIUM PO, CALCIUM PO, CoQ10, Accu-Chek Aviva Plus, Krill Oil, carisoprodol, rosuvastatin, Accu-Chek Aviva Plus, metFORMIN, losartan, and glimepiride.  Meds ordered this encounter  Medications  . tetanus  & diphtheria toxoids, adult, (TENIVAC) 5-2 LFU injection    Sig: Inject 0.5 mLs into the muscle once for 1 dose.    Dispense:  0.5 mL    Refill:  0     Penni Homans, MD

## 2019-10-05 NOTE — Assessment & Plan Note (Signed)
Encouraged to get adequate exercise, calcium and vitamin d intake 

## 2019-10-06 LAB — COMPREHENSIVE METABOLIC PANEL
ALT: 18 U/L (ref 0–35)
AST: 22 U/L (ref 0–37)
Albumin: 4.3 g/dL (ref 3.5–5.2)
Alkaline Phosphatase: 66 U/L (ref 39–117)
BUN: 15 mg/dL (ref 6–23)
CO2: 27 mEq/L (ref 19–32)
Calcium: 10.1 mg/dL (ref 8.4–10.5)
Chloride: 104 mEq/L (ref 96–112)
Creatinine, Ser: 0.63 mg/dL (ref 0.40–1.20)
GFR: 91.33 mL/min (ref >60.00)
Glucose, Bld: 61 mg/dL — ABNORMAL LOW (ref 70–99)
Potassium: 4.3 mEq/L (ref 3.5–5.1)
Sodium: 140 mEq/L (ref 135–145)
Total Bilirubin: 0.4 mg/dL (ref 0.2–1.2)
Total Protein: 6.9 g/dL (ref 6.0–8.3)

## 2019-10-06 LAB — CBC
HCT: 39.6 % (ref 36.0–46.0)
Hemoglobin: 13.2 g/dL (ref 12.0–15.0)
MCHC: 33.3 g/dL (ref 30.0–36.0)
MCV: 90.4 fl (ref 78.0–100.0)
Platelets: 280 10*3/uL (ref 150.0–400.0)
RBC: 4.37 Mil/uL (ref 3.87–5.11)
RDW: 12.6 % (ref 11.5–15.5)
WBC: 8.3 10*3/uL (ref 4.0–10.5)

## 2019-10-06 LAB — HEMOGLOBIN A1C: Hgb A1c MFr Bld: 7.7 % — ABNORMAL HIGH (ref 4.6–6.5)

## 2019-10-06 LAB — LIPID PANEL
Cholesterol: 170 mg/dL (ref 0–200)
HDL: 48 mg/dL (ref >39.00)
NonHDL: 121.58
Total CHOL/HDL Ratio: 4
Triglycerides: 246 mg/dL — ABNORMAL HIGH (ref 0.0–149.0)
VLDL: 49.2 mg/dL — ABNORMAL HIGH (ref 0.0–40.0)

## 2019-10-06 LAB — LDL CHOLESTEROL, DIRECT: Direct LDL: 95 mg/dL

## 2019-10-06 LAB — TSH: TSH: 2.73 u[IU]/mL (ref 0.35–4.50)

## 2019-10-08 DIAGNOSIS — H43813 Vitreous degeneration, bilateral: Secondary | ICD-10-CM | POA: Diagnosis not present

## 2019-10-08 DIAGNOSIS — H25813 Combined forms of age-related cataract, bilateral: Secondary | ICD-10-CM | POA: Diagnosis not present

## 2019-10-08 DIAGNOSIS — E119 Type 2 diabetes mellitus without complications: Secondary | ICD-10-CM | POA: Diagnosis not present

## 2019-10-08 DIAGNOSIS — H524 Presbyopia: Secondary | ICD-10-CM | POA: Diagnosis not present

## 2019-10-08 LAB — HM DIABETES EYE EXAM

## 2019-10-20 DIAGNOSIS — H25811 Combined forms of age-related cataract, right eye: Secondary | ICD-10-CM | POA: Diagnosis not present

## 2019-10-20 DIAGNOSIS — H268 Other specified cataract: Secondary | ICD-10-CM | POA: Diagnosis not present

## 2019-10-26 ENCOUNTER — Encounter: Payer: Self-pay | Admitting: Family Medicine

## 2019-10-26 NOTE — Progress Notes (Signed)
Eye exam report

## 2019-11-25 ENCOUNTER — Other Ambulatory Visit: Payer: Self-pay | Admitting: Family Medicine

## 2019-11-28 ENCOUNTER — Other Ambulatory Visit: Payer: Self-pay | Admitting: Family Medicine

## 2019-11-28 DIAGNOSIS — E669 Obesity, unspecified: Secondary | ICD-10-CM

## 2019-11-28 DIAGNOSIS — E1169 Type 2 diabetes mellitus with other specified complication: Secondary | ICD-10-CM

## 2020-01-19 ENCOUNTER — Ambulatory Visit: Payer: Medicare Other

## 2020-01-28 ENCOUNTER — Ambulatory Visit: Payer: Medicare Other | Attending: Internal Medicine

## 2020-01-28 DIAGNOSIS — Z23 Encounter for immunization: Secondary | ICD-10-CM

## 2020-01-28 NOTE — Progress Notes (Signed)
   Covid-19 Vaccination Clinic  Name:  DAICY BEUS    MRN: [id number] DOB: 1941-10-29  01/28/2020  Ms. Fugate was observed post Covid-19 immunization for 15 minutes without incidence. She was provided with Vaccine Information Sheet and instruction to access the V-Safe system.   Ms. Insana was instructed to call 911 with any severe reactions post vaccine: Marland Kitchen Difficulty breathing  . Swelling of your face and throat  . A fast heartbeat  . A bad rash all over your body  . Dizziness and weakness    Immunizations Administered    Name Date Dose VIS Date Route   Pfizer COVID-19 Vaccine 01/28/2020 11:53 AM 0.3 mL 12/04/2019 Intramuscular   Manufacturer: [place]   Lot: [id number]   [place]: [id number]

## 2020-02-22 ENCOUNTER — Ambulatory Visit: Payer: Medicare Other | Attending: Internal Medicine

## 2020-02-22 DIAGNOSIS — Z23 Encounter for immunization: Secondary | ICD-10-CM | POA: Insufficient documentation

## 2020-02-22 NOTE — Progress Notes (Signed)
   Covid-19 Vaccination Clinic  Name:  Gail Lester    MRN: [id number] DOB: May 27, 1941  02/22/2020  [name] was observed post Covid-19 immunization for 15 minutes without incidence. She was provided with Vaccine Information Sheet and instruction to access the V-Safe system.   [name] was instructed to call 911 with any severe reactions post vaccine: Marland Kitchen Difficulty breathing  . Swelling of your face and throat  . A fast heartbeat  . A bad rash all over your body  . Dizziness and weakness    Immunizations Administered    Name Date Dose VIS Date Route   Pfizer COVID-19 Vaccine 02/22/2020  3:10 PM 0.3 mL 12/04/2019 Intramuscular   Manufacturer: [place]   Lot: [id number]   [place]: [id number]

## 2020-03-24 HISTORY — PX: EYE SURGERY: SHX253

## 2020-04-04 NOTE — Progress Notes (Signed)
Nurse connected with patient 04/05/20 at 10:15 AM EDT by a telephone enabled telemedicine application and verified that I am speaking with the correct person using two identifiers. Patient stated full name and DOB. Patient gave permission to continue with virtual visit. Patient's location was at home and Nurse's location was at [facility] office.   Subjective:   Gail Lester is a 79 y.o. female who presents for Medicare Annual (Subsequent) preventive examination.  Review of Systems:  Home Safety/Smoke Alarms: Feels safe in home. Smoke alarms in place.  Lives alone w/ large dog [name]). 1 story townhouse. States she has the best neighbors.   Female:         Mammo- 11/27/18. Ordered.     Dexa scan- 11/27/18       CCS- No longer doing routine screening due to age.     Objective:     Vitals: Unable to assess. This visit is enabled though telemedicine due to Covid 19.   Advanced Directives 04/05/2020 11/24/2018 11/21/2017 10/24/2015 11/08/2014 12/09/2011 12/09/2011  Does Patient Have a Medical Advance Directive? Yes Yes Yes Yes No Patient does not have advance directive;Patient would not like information Patient does not have advance directive  Type of Advance Directive [place];Living will [place];Living will [place];Living will [place];Living will - - -  Does patient want to make changes to medical advance directive? No - Patient declined No - Patient declined - No - Patient declined - - -  Copy of [place] in Chart? No - copy requested Yes - validated most recent copy scanned in chart (See row information) Yes No - copy requested - - -  Would patient like information on creating a medical advance directive? - - - - Yes - Educational materials given - -  Pre-existing out of facility DNR order (yellow form or pink MOST form) - - - - - No No    Tobacco Social History   Tobacco Use    Smoking Status Former Smoker  . Quit date: 11/17/1989  . Years since quitting: 30.4  Smokeless Tobacco Former Engineer, structural given: Not Answered   Clinical Intake: Pain : No/denies pain    Past Medical History:  Diagnosis Date  . Arthritis   . Chicken pox as a child  . Constipation 02/15/2017  . Cough 03/24/2015  . Diabetes ([place])   . Diabetes mellitus   . Diabetes mellitus type 2 in obese [facility]) 02/10/2013   Saw Dr Delman Cheadle, eye doctor in 01/01/13, exam in chart   . Diarrhea 02/10/2013  . Dupuytren's contracture of both hands 02/10/2013  . Encounter for Medicare annual wellness exam 08/04/2013  . Fibrocystic breast 02/10/2013   Left h/o FN biopsy   . Ganglion cyst 11/14/2014   Right 2nd finger  . Hypercholesteremia   . Keratosis, seborrheic 08/01/2015  . Knee pain, left 02/15/2017  . Left arm pain 11/14/2014  . Measles as a child  . Medicare annual wellness visit, subsequent 11/06/2015  . 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 Mumps as a child  . Osteopenia 05/23/2017  . Other and unspecified hyperlipidemia 02/10/2013  . Other malaise and fatigue 08/04/2013  . Overweight 06/14/2014  . Preventative health care 11/09/2016   Past Surgical History:  Procedure Laterality Date  . APPENDECTOMY  1955  . BREAST BIOPSY  1985-1990   benign  . EYE SURGERY Right 04/2019   cataract removal  .  ORIF WRIST FRACTURE  12/09/2011   Procedure: OPEN REDUCTION INTERNAL FIXATION (ORIF) WRIST FRACTURE;  Surgeon: Linna Hoff;  Location: [facility];  Service: Orthopedics;  Laterality: Left;   Family History  Problem Relation Age of Onset  . Kidney disease Mother   . Diabetes Father   . Kidney disease Maternal Grandmother   . Cancer Maternal Grandmother        bladder  . Cancer Maternal Aunt   . Alzheimer's disease Maternal Aunt   . Scoliosis Son   . Colon cancer Other        grandparent   Social History   Socioeconomic History  . Marital status: Divorced    Spouse name: Not on file   . Number of children: Not on file  . Years of education: Not on file  . Highest education level: Not on file  Occupational History  . Not on file  Tobacco Use  . Smoking status: Former Smoker    Quit date: 11/17/1989    Years since quitting: 30.4  . Smokeless tobacco: Former Network engineer and Sexual Activity  . Alcohol use: No  . Drug use: No  . Sexual activity: Not Currently  Other Topics Concern  . Not on file  Social History Narrative  . Not on file   Social Determinants of Health   Financial Resource Strain:   . Difficulty of Paying Living Expenses:   Food Insecurity:   . Worried About Charity fundraiser in the Last Year:   . Arboriculturist in the Last Year:   Transportation Needs:   . Film/video editor (Medical):   Marland Kitchen Lack of Transportation (Non-Medical):   Physical Activity:   . Days of Exercise per Week:   . Minutes of Exercise per Session:   Stress:   . Feeling of Stress :   Social Connections:   . Frequency of Communication with Friends and Family:   . Frequency of Social Gatherings with Friends and Family:   . Attends Religious Services:   . Active Member of Clubs or Organizations:   . Attends Archivist Meetings:   Marland Kitchen Marital Status:     Outpatient Encounter Medications as of 04/05/2020  Medication Sig  . ACCU-CHEK AVIVA PLUS test strip CHECK BLOOD SUGAR 4 TIMES  DAILY AND AS NEEDED FOR  LABILE BLOOD SUGARS  . aspirin 81 MG tablet Take 81 mg by mouth daily.  . Blood Glucose Monitoring Suppl (ACCU-CHEK AVIVA PLUS) W/DEVICE KIT Use as directed  . CALCIUM PO Take 400 mg by mouth daily.  . carisoprodol (SOMA) 350 MG tablet Take 1 tablet (350 mg total) by mouth 2 (two) times daily as needed for muscle spasms.  . Cholecalciferol (D-3-5) 5000 UNITS capsule Take 4,000 Units by mouth daily.   . Coenzyme Q10 (COQ10) 100 MG CAPS Take 1 capsule by mouth daily.  Marland Kitchen glimepiride (AMARYL) 4 MG tablet TAKE 1 TABLET BY MOUTH  TWICE DAILY  . Krill Oil 500  MG CAPS Take 1 capsule by mouth daily.  Marland Kitchen losartan (COZAAR) 25 MG tablet TAKE 1 TABLET BY MOUTH  DAILY  . Lutein 20 MG CAPS Take by mouth.  . metFORMIN (GLUCOPHAGE-XR) 500 MG 24 hr tablet TAKE 1 TABLET BY MOUTH 3  TIMES DAILY AS NEEDED (Patient taking differently: 500 mg daily. )  . Multiple Vitamin (MULTIVITAMIN) tablet Take 1 tablet by mouth as directed. Two times a week  . rosuvastatin (CRESTOR) 20 MG tablet TAKE ONE-HALF TABLET  BY  MOUTH EVERY DAY  . [DISCONTINUED] b complex vitamins tablet Take 1 tablet by mouth as directed. Two times a week  . [DISCONTINUED] MAGNESIUM PO Take 250 mg by mouth daily.    No facility-administered encounter medications on file as of 04/05/2020.    Activities of Daily Living In your present state of health, do you have any difficulty performing the following activities: 04/05/2020  Hearing? N  Vision? N  Difficulty concentrating or making decisions? N  Walking or climbing stairs? N  Dressing or bathing? N  Doing errands, shopping? N  Preparing Food and eating ? N  Using the Toilet? N  In the past six months, have you accidently leaked urine? N  Do you have problems with loss of bowel control? N  Managing your Medications? N  Managing your Finances? N  Housekeeping or managing your Housekeeping? N  Some recent data might be hidden    Patient Care Team: Mosie Lukes, MD as PCP - General (Family Medicine) Sharyne Peach, MD as Consulting Physician (Ophthalmology) [place]ique, Amy, MD as Consulting Physician (Dermatology)    Assessment:   This is a routine wellness examination for Gail Lester. Physical assessment deferred to PCP.   Exercise Activities and Dietary recommendations Current Exercise Habits: Home exercise routine, Type of exercise: walking, Time (Minutes): 30, Frequency (Times/Week): 7, Weekly Exercise (Minutes/Week): 210, Intensity: Mild, Exercise limited by: None identified   Diet (meal preparation, eat out, water intake, caffeinated  beverages, dairy products, fruits and vegetables): Eats a lot of vegetables, very little meat, very little carbs and sugar.    Goals    . Continue daily exercise routine.    . Increase physical activity (pt-stated)    . Increase water intake       Fall Risk Fall Risk  04/05/2020 10/05/2019 11/24/2018 11/21/2017 11/09/2016  Falls in the past year? _0 No Yes  Number falls in past yr: 0 0 0 - 2 or more  Injury with Fall? 1 1 0 - No  Risk for fall due to : - Medication side effect - - -  Follow up Education provided;Falls prevention discussed Falls evaluation completed;Education provided;Falls prevention discussed - - -     Depression Screen PHQ 2/9 Scores 04/05/2020 10/05/2019 11/24/2018 11/21/2017  PHQ - 2 Score 0 0 0 0     Cognitive Function Ad8 score reviewed for issues:  Issues making decisions:no  Less interest in hobbies / activities:no  Repeats questions, stories (family complaining):no  Trouble using ordinary gadgets (microwave, computer, phone):no  Forgets the month or year: no  Mismanaging finances: no  Remembering appts:no  Daily problems with thinking and/or memory:no Ad8 score is=0     MMSE - Mini Mental State Exam 11/21/2017 11/09/2016  Orientation to time 5 5  Orientation to Place 5 5  Registration 3 3  Attention/ Calculation 5 5  Recall 3 3  Language- name 2 objects 2 2  Language- repeat 1 1  Language- follow 3 step command 3 3  Language- read & follow direction 1 1  Write a sentence 1 1  Copy design 1 1  Total score 30 30        Immunization History  Administered Date(s) Administered  . Fluad Quad(high Dose 65+) 08/28/2019  . Influenza, High Dose Seasonal PF 09/11/2016, 10/03/2017, 08/26/2018  . Influenza,inj,Quad PF,6+ Mos 09/02/2013, 08/31/2014, 09/21/2015  . PFIZER SARS-COV-2 Vaccination 01/28/2020, 02/22/2020  . Pneumococcal Conjugate-13 02/04/2014  . Pneumococcal Polysaccharide-23 01/12/2007  . Tdap 01/13/2008  .  Zoster  01/12/2007  . Zoster Recombinat (Shingrix) 08/05/2018, 10/01/2018    Screening Tests Health Maintenance  Topic Date Due  . FOOT EXAM  01/29/2017  . TETANUS/TDAP  01/12/2018  . HEMOGLOBIN A1C  04/04/2020  . INFLUENZA VACCINE  07/24/2020  . OPHTHALMOLOGY EXAM  10/07/2020  . DEXA SCAN  Completed  . PNA vac Low Risk Adult  Completed     Plan:   See you next year!  Continue to eat heart healthy diet (full of fruits, vegetables, whole grains, lean protein, water--limit salt, fat, and sugar intake) and increase physical activity as tolerated.  Continue doing brain stimulating activities (puzzles, reading, adult coloring books, staying active) to keep memory sharp.   I have ordered your mammogram. Please schedule.  I have personally reviewed and noted the following in the patient's chart:   . Medical and social history . Use of alcohol, tobacco or illicit drugs  . Current medications and supplements . Functional ability and status . Nutritional status . Physical activity . Advanced directives . List of other physicians . Hospitalizations, surgeries, and ER visits in previous 12 months . Vitals . Screenings to include cognitive, depression, and falls . Referrals and appointments  In addition, I have reviewed and discussed with patient certain preventive protocols, quality metrics, and best practice recommendations. A written personalized care plan for preventive services as well as general preventive health recommendations were provided to patient.     Shela Nevin, [place]  04/05/2020

## 2020-04-05 ENCOUNTER — Encounter: Payer: Self-pay | Admitting: *Deleted

## 2020-04-05 ENCOUNTER — Encounter: Payer: Self-pay | Admitting: Family Medicine

## 2020-04-05 ENCOUNTER — Other Ambulatory Visit: Payer: Self-pay | Admitting: Family Medicine

## 2020-04-05 ENCOUNTER — Ambulatory Visit (INDEPENDENT_AMBULATORY_CARE_PROVIDER_SITE_OTHER): Payer: Medicare Other | Admitting: *Deleted

## 2020-04-05 ENCOUNTER — Ambulatory Visit (INDEPENDENT_AMBULATORY_CARE_PROVIDER_SITE_OTHER): Payer: Medicare Other | Admitting: Family Medicine

## 2020-04-05 ENCOUNTER — Other Ambulatory Visit: Payer: Self-pay

## 2020-04-05 ENCOUNTER — Telehealth: Payer: Self-pay

## 2020-04-05 VITALS — BP 116/60 | HR 69 | Temp 98.4°F | Resp 12 | Ht 64.0 in | Wt 161.2 lb

## 2020-04-05 DIAGNOSIS — E1169 Type 2 diabetes mellitus with other specified complication: Secondary | ICD-10-CM

## 2020-04-05 DIAGNOSIS — Z1231 Encounter for screening mammogram for malignant neoplasm of breast: Secondary | ICD-10-CM

## 2020-04-05 DIAGNOSIS — E669 Obesity, unspecified: Secondary | ICD-10-CM

## 2020-04-05 DIAGNOSIS — M8580 Other specified disorders of bone density and structure, unspecified site: Secondary | ICD-10-CM | POA: Diagnosis not present

## 2020-04-05 DIAGNOSIS — H269 Unspecified cataract: Secondary | ICD-10-CM | POA: Diagnosis not present

## 2020-04-05 DIAGNOSIS — Z0000 Encounter for general adult medical examination without abnormal findings: Secondary | ICD-10-CM

## 2020-04-05 DIAGNOSIS — E782 Mixed hyperlipidemia: Secondary | ICD-10-CM

## 2020-04-05 DIAGNOSIS — R252 Cramp and spasm: Secondary | ICD-10-CM

## 2020-04-05 DIAGNOSIS — R197 Diarrhea, unspecified: Secondary | ICD-10-CM

## 2020-04-05 DIAGNOSIS — E663 Overweight: Secondary | ICD-10-CM

## 2020-04-05 MED ORDER — TIZANIDINE HCL 2 MG PO TABS: 1.0000 mg | ORAL_TABLET | Freq: Four times a day (QID) | ORAL | 1 refills | Status: DC | PRN

## 2020-04-05 MED ORDER — CARISOPRODOL 350 MG PO TABS: 350.0000 mg | ORAL_TABLET | Freq: Two times a day (BID) | ORAL | 1 refills | Status: DC | PRN

## 2020-04-05 NOTE — Patient Instructions (Signed)

## 2020-04-05 NOTE — Telephone Encounter (Signed)
Carisoprodol not covered by insurance, preferred alternatives: tizanidine tablets, flurbiprofen. Please advise.

## 2020-04-05 NOTE — Telephone Encounter (Signed)
I sent in Tizanidine 1-4 mg tid prn for her to try.

## 2020-04-05 NOTE — Assessment & Plan Note (Signed)
Encouraged heart healthy diet, increase exercise, avoid trans fats, consider a krill oil cap daily 

## 2020-04-05 NOTE — Assessment & Plan Note (Signed)
hgba1c acceptable, minimize simple carbs. Increase exercise as tolerated. Continue current meds 

## 2020-04-05 NOTE — Assessment & Plan Note (Signed)
Right eye done. Much better is having the left eye evaluated

## 2020-04-05 NOTE — Progress Notes (Signed)
 Subjective:    Patient ID: Gail Lester, female    DOB: 12/19/1941, 78 y.o.   MRN: [id number]  Chief Complaint  Patient presents with  . 6 months follow up    HPI Patient is in today for follow up on chronic medical concerns. Her sacral fracture from last fall is much better but still hurts. She is trying to eat better and exercise. She feels she falls too frequently, 4 falls in 2 years. Other than the sacral fracture. She feels she has accommodate the falls well. Denies CP/palp/SOB/HA/congestion/fevers/GI or GU c/o. Taking meds as prescribed  Past Medical History:  Diagnosis Date  . Arthritis   . Chicken pox as a child  . Constipation 02/15/2017  . Cough 03/24/2015  . Diabetes ([facility])   . Diabetes mellitus   . Diabetes mellitus type 2 in obese ([facility]) 02/10/2013   Saw Dr Gould, eye doctor in 01/01/13, exam in chart   . Diarrhea 02/10/2013  . Dupuytren's contracture of both hands 02/10/2013  . Encounter for Medicare annual wellness exam 08/04/2013  . Fibrocystic breast 02/10/2013   Left h/o FN biopsy   . Ganglion cyst 11/14/2014   Right 2nd finger  . Hypercholesteremia   . Keratosis, seborrheic 08/01/2015  . Knee pain, left 02/15/2017  . Left arm pain 11/14/2014  . Measles as a child  . Medicare annual wellness visit, subsequent 11/06/2015  . 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 Mumps as a child  . Osteopenia 05/23/2017  . Other and unspecified hyperlipidemia 02/10/2013  . Other malaise and fatigue 08/04/2013  . Overweight 06/14/2014  . Preventative health care 11/09/2016    Past Surgical History:  Procedure Laterality Date  . APPENDECTOMY  1955  . BREAST BIOPSY  1985-1990   benign  . EYE SURGERY Right 04/2019   cataract removal  . ORIF WRIST FRACTURE  12/09/2011   Procedure: OPEN REDUCTION INTERNAL FIXATION (ORIF) WRIST FRACTURE;  Surgeon: Fred W Ortmann;  Location: [facility];  Service: Orthopedics;  Laterality: Left;    Family History  Problem Relation Age of Onset  .  Kidney disease Mother   . Diabetes Father   . Kidney disease Maternal Grandmother   . Cancer Maternal Grandmother        bladder  . Cancer Maternal Aunt   . Alzheimer's disease Maternal Aunt   . Scoliosis Son   . Colon cancer Other        grandparent    Social History   Socioeconomic History  . Marital status: Divorced    Spouse name: Not on file  . Number of children: Not on file  . Years of education: Not on file  . Highest education level: Not on file  Occupational History  . Not on file  Tobacco Use  . Smoking status: Former Smoker    Quit date: 11/17/1989    Years since quitting: 30.4  . Smokeless tobacco: Former User  Substance and Sexual Activity  . Alcohol use: No  . Drug use: No  . Sexual activity: Not Currently  Other Topics Concern  . Not on file  Social History Narrative  . Not on file   Social Determinants of Health   Financial Resource Strain: Low Risk   . Difficulty of Paying Living Expenses: Not hard at all  Food Insecurity: No Food Insecurity  . Worried About Running Out of Food in the Last Year: Never true  . Ran Out of Food in the Last Year: Never true    Transportation Needs: No Transportation Needs  . Lack of Transportation (Medical): No  . Lack of Transportation (Non-Medical): No  Physical Activity:   . Days of Exercise per Week:   . Minutes of Exercise per Session:   Stress:   . Feeling of Stress :   Social Connections:   . Frequency of Communication with Friends and Family:   . Frequency of Social Gatherings with Friends and Family:   . Attends Religious Services:   . Active Member of Clubs or Organizations:   . Attends Archivist Meetings:   Marland Kitchen Marital Status:   Intimate Partner Violence:   . Fear of Current or Ex-Partner:   . Emotionally Abused:   Marland Kitchen Physically Abused:   . Sexually Abused:     Outpatient Medications Prior to Visit  Medication Sig Dispense Refill  . ACCU-CHEK AVIVA PLUS test strip CHECK BLOOD SUGAR 4  TIMES  DAILY AND AS NEEDED FOR  LABILE BLOOD SUGARS 100 each 11  . aspirin 81 MG tablet Take 81 mg by mouth daily.    . Blood Glucose Monitoring Suppl (ACCU-CHEK AVIVA PLUS) W/DEVICE KIT Use as directed 1 kit 0  . CALCIUM PO Take 400 mg by mouth daily.    . Cholecalciferol (VITAMIN D3) 50 MCG (2000 UT) TABS Take 1 tablet by mouth daily.    . Coenzyme Q10 (COQ10) 100 MG CAPS Take 1 capsule by mouth daily.    Marland Kitchen glimepiride (AMARYL) 4 MG tablet TAKE 1 TABLET BY MOUTH  TWICE DAILY 180 tablet 3  . Krill Oil 500 MG CAPS Take 1 capsule by mouth daily.    Marland Kitchen losartan (COZAAR) 25 MG tablet TAKE 1 TABLET BY MOUTH  DAILY 90 tablet 3  . LUTEIN PO Take 25 mg by mouth daily.    . metFORMIN (GLUCOPHAGE-XR) 500 MG 24 hr tablet Take 500 mg by mouth daily.    . Multiple Vitamin (MULTIVITAMIN) tablet Take 1 tablet by mouth daily.     . rosuvastatin (CRESTOR) 20 MG tablet TAKE ONE-HALF TABLET BY  MOUTH EVERY DAY 45 tablet 3  . carisoprodol (SOMA) 350 MG tablet Take 1 tablet (350 mg total) by mouth 2 (two) times daily as needed for muscle spasms. 20 tablet 1  . Cholecalciferol (D-3-5) 5000 UNITS capsule Take 4,000 Units by mouth daily.     . Lutein 20 MG CAPS Take by mouth.    . metFORMIN (GLUCOPHAGE-XR) 500 MG 24 hr tablet TAKE 1 TABLET BY MOUTH 3  TIMES DAILY AS NEEDED (Patient taking differently: 500 mg daily. ) 270 tablet 3   No facility-administered medications prior to visit.    Allergies  Allergen Reactions  . Ramipril Diarrhea  . Lipitor [Atorvastatin]     Diarrhea, muscle cramps  . Lisinopril     myalgias    Review of Systems  Constitutional: Negative for fever and malaise/fatigue.  HENT: Negative for congestion.   Eyes: Negative for blurred vision.  Respiratory: Negative for shortness of breath.   Cardiovascular: Negative for chest pain, palpitations and leg swelling.  Gastrointestinal: Negative for abdominal pain, blood in stool and nausea.  Genitourinary: Negative for dysuria and  frequency.  Musculoskeletal: Positive for myalgias. Negative for falls.  Skin: Negative for rash.  Neurological: Negative for dizziness, loss of consciousness and headaches.  Endo/Heme/Allergies: Negative for environmental allergies.  Psychiatric/Behavioral: Negative for depression. The patient is not nervous/anxious.        Objective:    Physical Exam Vitals and nursing note reviewed.  Constitutional:      General: She is not in acute distress.    Appearance: She is well-developed.  HENT:     Head: Normocephalic and atraumatic.     Nose: Nose normal.  Eyes:     General:        Right eye: No discharge.        Left eye: No discharge.  Cardiovascular:     Rate and Rhythm: Normal rate and regular rhythm.     Heart sounds: No murmur.  Pulmonary:     Effort: Pulmonary effort is normal.     Breath sounds: Normal breath sounds.  Abdominal:     General: Bowel sounds are normal.     Palpations: Abdomen is soft.     Tenderness: There is no abdominal tenderness.  Musculoskeletal:     Cervical back: Normal range of motion and neck supple.  Skin:    General: Skin is warm and dry.  Neurological:     Mental Status: She is alert and oriented to person, place, and time.     BP 116/60 (BP Location: Left Arm, Cuff Size: Normal)   Pulse 69   Temp 98.4 F (36.9 C) (Temporal)   Resp 12   Ht 5' 4" (1.626 m)   Wt 161 lb 3.2 oz (73.1 kg)   SpO2 98%   BMI 27.67 kg/m  Wt Readings from Last 3 Encounters:  04/05/20 161 lb 3.2 oz (73.1 kg)  10/05/19 159 lb 6 oz (72.3 kg)  09/08/19 139 lb 6.4 oz (63.2 kg)    Diabetic Foot Exam - Simple   No data filed     Lab Results  Component Value Date   WBC 8.1 04/05/2020   HGB 13.5 04/05/2020   HCT 39.9 04/05/2020   PLT 285.0 04/05/2020   GLUCOSE 62 (L) 04/05/2020   CHOL 165 04/05/2020   TRIG 192.0 (H) 04/05/2020   HDL 43.80 04/05/2020   LDLDIRECT 95.0 10/05/2019   LDLCALC 83 04/05/2020   ALT 18 04/05/2020   AST 21 04/05/2020   NA  139 04/05/2020   K 3.9 04/05/2020   CL 103 04/05/2020   CREATININE 0.69 04/05/2020   BUN 16 04/05/2020   CO2 27 04/05/2020   TSH 2.75 04/05/2020   HGBA1C 7.8 (H) 04/05/2020   MICROALBUR 1.6 11/03/2015    Lab Results  Component Value Date   TSH 2.75 04/05/2020   Lab Results  Component Value Date   WBC 8.1 04/05/2020   HGB 13.5 04/05/2020   HCT 39.9 04/05/2020   MCV 91.7 04/05/2020   PLT 285.0 04/05/2020   Lab Results  Component Value Date   NA 139 04/05/2020   K 3.9 04/05/2020   CO2 27 04/05/2020   GLUCOSE 62 (L) 04/05/2020   BUN 16 04/05/2020   CREATININE 0.69 04/05/2020   BILITOT 0.4 04/05/2020   ALKPHOS 51 04/05/2020   AST 21 04/05/2020   ALT 18 04/05/2020   PROT 6.8 04/05/2020   ALBUMIN 4.3 04/05/2020   CALCIUM 9.8 04/05/2020   GFR 82.12 04/05/2020   Lab Results  Component Value Date   CHOL 165 04/05/2020   Lab Results  Component Value Date   HDL 43.80 04/05/2020   Lab Results  Component Value Date   LDLCALC 83 04/05/2020   Lab Results  Component Value Date   TRIG 192.0 (H) 04/05/2020   Lab Results  Component Value Date   CHOLHDL 4 04/05/2020   Lab Results  Component Value Date  HGBA1C 7.8 (H) 04/05/2020       Assessment & Plan:   Problem List Items Addressed This Visit    Diabetes mellitus type 2 in obese ([place])    hgba1c acceptable, minimize simple carbs. Increase exercise as tolerated. Continue current meds.      Relevant Medications   metFORMIN (GLUCOPHAGE-XR) 500 MG 24 hr tablet   Other Relevant Orders   Hemoglobin A1c (Completed)   Comprehensive metabolic panel (Completed)   TSH (Completed)   Hyperlipidemia, mixed    Encouraged heart healthy diet, increase exercise, avoid trans fats, consider a krill oil cap daily      Relevant Orders   Lipid panel (Completed)   Diarrhea    Well controlled with dietary changes most days      Overweight   Relevant Orders   Magnesium (Completed)   Osteopenia - Primary    Encouraged to  get adequate exercise, calcium and vitamin d intake      Muscle cramps   Relevant Orders   CBC (Completed)   Magnesium (Completed)   Cataract    Right eye done. Much better is having the left eye evaluated      Relevant Orders   CBC (Completed)      I have discontinued Tila T. Fleissner's Cholecalciferol, Lutein, carisoprodol, and carisoprodol. I am also having her maintain her aspirin, multivitamin, CALCIUM PO, CoQ10, Accu-Chek Aviva Plus, Krill Oil, Accu-Chek Aviva Plus, glimepiride, losartan, rosuvastatin, metFORMIN, Vitamin D3, and LUTEIN PO.  Meds ordered this encounter  Medications  . DISCONTD: carisoprodol (SOMA) 350 MG tablet    Sig: Take 1 tablet (350 mg total) by mouth 2 (two) times daily as needed for muscle spasms.    Dispense:  20 tablet    Refill:  1  . DISCONTD: carisoprodol (SOMA) 350 MG tablet    Sig: Take 1 tablet (350 mg total) by mouth 2 (two) times daily as needed for muscle spasms.    Dispense:  20 tablet    Refill:  1     Penni Homans, MD

## 2020-04-05 NOTE — Patient Instructions (Signed)
See you next year!  Continue to eat heart healthy diet (full of fruits, vegetables, whole grains, lean protein, water--limit salt, fat, and sugar intake) and increase physical activity as tolerated.  Continue doing brain stimulating activities (puzzles, reading, adult coloring books, staying active) to keep memory sharp.   I have ordered your mammogram. Please schedule.   Gail Lester , Thank you for taking time to come for your Medicare Wellness Visit. I appreciate your ongoing commitment to your health goals. Please review the following plan we discussed and let me know if I can assist you in the future.   These are the goals we discussed: Goals    . Continue daily exercise routine.    . Increase physical activity (pt-stated)    . Increase water intake       This is a list of the screening recommended for you and due dates:  Health Maintenance  Topic Date Due  . Complete foot exam   01/29/2017  . Tetanus Vaccine  01/12/2018  . Hemoglobin A1C  04/04/2020  . Flu Shot  07/24/2020  . Eye exam for diabetics  10/07/2020  . DEXA scan (bone density measurement)  Completed  . Pneumonia vaccines  Completed    Preventive Care 50 Years and Older, Female Preventive care refers to lifestyle choices and visits with your health care provider that can promote health and wellness. This includes:  A yearly physical exam. This is also called an annual well check.  Regular dental and eye exams.  Immunizations.  Screening for certain conditions.  Healthy lifestyle choices, such as diet and exercise. What can I expect for my preventive care visit? Physical exam Your health care provider will check:  Height and weight. These may be used to calculate body mass index (BMI), which is a measurement that tells if you are at a healthy weight.  Heart rate and blood pressure.  Your skin for abnormal spots. Counseling Your health care provider may ask you questions about:  Alcohol, tobacco, and  drug use.  Emotional well-being.  Home and relationship well-being.  Sexual activity.  Eating habits.  History of falls.  Memory and ability to understand (cognition).  Work and work Statistician.  Pregnancy and menstrual history. What immunizations do I need?  Influenza (flu) vaccine  This is recommended every year. Tetanus, diphtheria, and pertussis (Tdap) vaccine  You may need a Td booster every 10 years. Varicella (chickenpox) vaccine  You may need this vaccine if you have not already been vaccinated. Zoster (shingles) vaccine  You may need this after age 51. Pneumococcal conjugate (PCV13) vaccine  One dose is recommended after age 38. Pneumococcal polysaccharide (PPSV23) vaccine  One dose is recommended after age 52. Measles, mumps, and rubella (MMR) vaccine  You may need at least one dose of MMR if you were born in 1957 or later. You may also need a second dose. Meningococcal conjugate (MenACWY) vaccine  You may need this if you have certain conditions. Hepatitis A vaccine  You may need this if you have certain conditions or if you travel or work in places where you may be exposed to hepatitis A. Hepatitis B vaccine  You may need this if you have certain conditions or if you travel or work in places where you may be exposed to hepatitis B. Haemophilus influenzae type b (Hib) vaccine  You may need this if you have certain conditions. You may receive vaccines as individual doses or as more than one vaccine together in one  shot (combination vaccines). Talk with your health care provider about the risks and benefits of combination vaccines. What tests do I need? Blood tests  Lipid and cholesterol levels. These may be checked every 5 years, or more frequently depending on your overall health.  Hepatitis C test.  Hepatitis B test. Screening  Lung cancer screening. You may have this screening every year starting at age 45 if you have a 30-pack-year history  of smoking and currently smoke or have quit within the past 15 years.  Colorectal cancer screening. All adults should have this screening starting at age 47 and continuing until age 70. Your health care provider may recommend screening at age 49 if you are at increased risk. You will have tests every 1-10 years, depending on your results and the type of screening test.  Diabetes screening. This is done by checking your blood sugar (glucose) after you have not eaten for a while (fasting). You may have this done every 1-3 years.  Mammogram. This may be done every 1-2 years. Talk with your health care provider about how often you should have regular mammograms.  BRCA-related cancer screening. This may be done if you have a family history of breast, ovarian, tubal, or peritoneal cancers. Other tests  Sexually transmitted disease (STD) testing.  Bone density scan. This is done to screen for osteoporosis. You may have this done starting at age 41. Follow these instructions at home: Eating and drinking  Eat a diet that includes fresh fruits and vegetables, whole grains, lean protein, and low-fat dairy products. Limit your intake of foods with high amounts of sugar, saturated fats, and salt.  Take vitamin and mineral supplements as recommended by your health care provider.  Do not drink alcohol if your health care provider tells you not to drink.  If you drink alcohol: ? Limit how much you have to 0-1 drink a day. ? Be aware of how much alcohol is in your drink. In the [place] one drink equals one 12 oz bottle of beer (355 mL), one 5 oz glass of wine (148 mL), or one 1 oz glass of hard liquor (44 mL). Lifestyle  Take daily care of your teeth and gums.  Stay active. Exercise for at least 30 minutes on 5 or more days each week.  Do not use any products that contain nicotine or tobacco, such as cigarettes, e-cigarettes, and chewing tobacco. If you need help quitting, ask your health care  provider.  If you are sexually active, practice safe sex. Use a condom or other form of protection in order to prevent STIs (sexually transmitted infections).  Talk with your health care provider about taking a low-dose aspirin or statin. What's next?  Go to your health care provider once a year for a well check visit.  Ask your health care provider how often you should have your eyes and teeth checked.  Stay up to date on all vaccines. This information is not intended to replace advice given to you by your health care provider. Make sure you discuss any questions you have with your health care provider. Document Revised: 12/04/2018 Document Reviewed: 12/04/2018 Elsevier Patient Education  2020 [employer].

## 2020-04-06 LAB — COMPREHENSIVE METABOLIC PANEL
ALT: 18 U/L (ref 0–35)
AST: 21 U/L (ref 0–37)
Albumin: 4.3 g/dL (ref 3.5–5.2)
Alkaline Phosphatase: 51 U/L (ref 39–117)
BUN: 16 mg/dL (ref 6–23)
CO2: 27 mEq/L (ref 19–32)
Calcium: 9.8 mg/dL (ref 8.4–10.5)
Chloride: 103 mEq/L (ref 96–112)
Creatinine, Ser: 0.69 mg/dL (ref 0.40–1.20)
GFR: 82.12 mL/min (ref >60.00)
Glucose, Bld: 62 mg/dL — ABNORMAL LOW (ref 70–99)
Potassium: 3.9 mEq/L (ref 3.5–5.1)
Sodium: 139 mEq/L (ref 135–145)
Total Bilirubin: 0.4 mg/dL (ref 0.2–1.2)
Total Protein: 6.8 g/dL (ref 6.0–8.3)

## 2020-04-06 LAB — CBC
HCT: 39.9 % (ref 36.0–46.0)
Hemoglobin: 13.5 g/dL (ref 12.0–15.0)
MCHC: 33.7 g/dL (ref 30.0–36.0)
MCV: 91.7 fl (ref 78.0–100.0)
Platelets: 285 10*3/uL (ref 150.0–400.0)
RBC: 4.36 Mil/uL (ref 3.87–5.11)
RDW: 13.1 % (ref 11.5–15.5)
WBC: 8.1 10*3/uL (ref 4.0–10.5)

## 2020-04-06 LAB — LIPID PANEL
Cholesterol: 165 mg/dL (ref 0–200)
HDL: 43.8 mg/dL (ref >39.00)
LDL Cholesterol: 83 mg/dL (ref 0–99)
NonHDL: 121.64
Total CHOL/HDL Ratio: 4
Triglycerides: 192 mg/dL — ABNORMAL HIGH (ref 0.0–149.0)
VLDL: 38.4 mg/dL (ref 0.0–40.0)

## 2020-04-06 LAB — MAGNESIUM: Magnesium: 1.9 mg/dL (ref 1.5–2.5)

## 2020-04-06 LAB — TSH: TSH: 2.75 u[IU]/mL (ref 0.35–4.50)

## 2020-04-06 LAB — HEMOGLOBIN A1C: Hgb A1c MFr Bld: 7.8 % — ABNORMAL HIGH (ref 4.6–6.5)

## 2020-04-06 NOTE — Telephone Encounter (Signed)
Patient notified of change.

## 2020-04-06 NOTE — Assessment & Plan Note (Signed)
Well controlled with dietary changes most days

## 2020-04-06 NOTE — Assessment & Plan Note (Signed)
Encouraged to get adequate exercise, calcium and vitamin d intake 

## 2020-04-11 ENCOUNTER — Encounter (HOSPITAL_BASED_OUTPATIENT_CLINIC_OR_DEPARTMENT_OTHER): Payer: Self-pay

## 2020-04-11 ENCOUNTER — Ambulatory Visit (HOSPITAL_BASED_OUTPATIENT_CLINIC_OR_DEPARTMENT_OTHER)
Admission: RE | Admit: 2020-04-11 | Discharge: 2020-04-11 | Disposition: A | Discharge status: Home / Self Care | Payer: Medicare Other | Source: Ambulatory Visit | Attending: Family Medicine | Admitting: Family Medicine

## 2020-04-11 ENCOUNTER — Other Ambulatory Visit: Payer: Self-pay

## 2020-04-11 DIAGNOSIS — Z1231 Encounter for screening mammogram for malignant neoplasm of breast: Secondary | ICD-10-CM

## 2020-04-25 DIAGNOSIS — E119 Type 2 diabetes mellitus without complications: Secondary | ICD-10-CM | POA: Diagnosis not present

## 2020-04-25 DIAGNOSIS — H524 Presbyopia: Secondary | ICD-10-CM | POA: Diagnosis not present

## 2020-04-25 DIAGNOSIS — H43813 Vitreous degeneration, bilateral: Secondary | ICD-10-CM | POA: Diagnosis not present

## 2020-04-25 DIAGNOSIS — H25812 Combined forms of age-related cataract, left eye: Secondary | ICD-10-CM | POA: Diagnosis not present

## 2020-04-25 LAB — HM DIABETES EYE EXAM

## 2020-04-26 ENCOUNTER — Encounter: Payer: Self-pay | Admitting: *Deleted

## 2020-06-28 DIAGNOSIS — H11152 Pinguecula, left eye: Secondary | ICD-10-CM | POA: Diagnosis not present

## 2020-07-02 ENCOUNTER — Other Ambulatory Visit: Payer: Self-pay | Admitting: Family Medicine

## 2020-07-05 DIAGNOSIS — H21562 Pupillary abnormality, left eye: Secondary | ICD-10-CM | POA: Diagnosis not present

## 2020-07-05 DIAGNOSIS — H268 Other specified cataract: Secondary | ICD-10-CM | POA: Diagnosis not present

## 2020-07-05 DIAGNOSIS — H25812 Combined forms of age-related cataract, left eye: Secondary | ICD-10-CM | POA: Diagnosis not present

## 2020-08-17 DIAGNOSIS — Z961 Presence of intraocular lens: Secondary | ICD-10-CM | POA: Diagnosis not present

## 2020-09-08 ENCOUNTER — Telehealth: Payer: Self-pay | Admitting: Family Medicine

## 2020-09-08 NOTE — Progress Notes (Signed)
  Chronic Care Management   Note  09/08/2020 Name: Gail Lester MRN: [id number] DOB: [id number]  Gail Lester is a 79 y.o. year old female who is a primary care patient of Mosie Lukes, MD. I reached out to Gail Lester by phone today in response to a referral sent by Ms. Charlott Rakes T Abaya's PCP, Mosie Lukes, MD.   Gail Lester was given information about Chronic Care Management services today including:  1. CCM service includes personalized support from designated clinical staff supervised by her physician, including individualized plan of care and coordination with other care providers 2. 24/7 contact phone numbers for assistance for urgent and routine care needs. 3. Service will only be billed when office clinical staff spend 20 minutes or more in a month to coordinate care. 4. Only one practitioner may furnish and bill the service in a calendar month. 5. The patient may stop CCM services at any time (effective at the end of the month) by phone call to the office staff.   Patient agreed to services and verbal consent obtained.   Follow up plan:   Carley Perdue [employer] Scheduler

## 2020-09-21 ENCOUNTER — Other Ambulatory Visit: Payer: Self-pay

## 2020-09-21 ENCOUNTER — Ambulatory Visit: Payer: Medicare Other

## 2020-10-04 ENCOUNTER — Other Ambulatory Visit: Payer: Self-pay

## 2020-10-04 ENCOUNTER — Ambulatory Visit (INDEPENDENT_AMBULATORY_CARE_PROVIDER_SITE_OTHER): Payer: Medicare Other | Admitting: *Deleted

## 2020-10-04 DIAGNOSIS — Z23 Encounter for immunization: Secondary | ICD-10-CM | POA: Diagnosis not present

## 2020-10-04 NOTE — Progress Notes (Signed)
Patient here for high flu vaccine.  Vaccine given in left deltoid and patient tolerated well.

## 2020-10-06 ENCOUNTER — Ambulatory Visit (INDEPENDENT_AMBULATORY_CARE_PROVIDER_SITE_OTHER): Payer: Medicare Other | Admitting: Family Medicine

## 2020-10-06 ENCOUNTER — Other Ambulatory Visit: Payer: Self-pay

## 2020-10-06 ENCOUNTER — Encounter: Payer: Self-pay | Admitting: Family Medicine

## 2020-10-06 VITALS — BP 119/69 | HR 67 | Temp 98.1°F | Resp 12 | Ht 64.0 in | Wt 156.0 lb

## 2020-10-06 DIAGNOSIS — K219 Gastro-esophageal reflux disease without esophagitis: Secondary | ICD-10-CM

## 2020-10-06 DIAGNOSIS — G3184 Mild cognitive impairment, so stated: Secondary | ICD-10-CM

## 2020-10-06 DIAGNOSIS — R2681 Unsteadiness on feet: Secondary | ICD-10-CM | POA: Diagnosis not present

## 2020-10-06 DIAGNOSIS — E782 Mixed hyperlipidemia: Secondary | ICD-10-CM

## 2020-10-06 DIAGNOSIS — R269 Unspecified abnormalities of gait and mobility: Secondary | ICD-10-CM

## 2020-10-06 DIAGNOSIS — R252 Cramp and spasm: Secondary | ICD-10-CM

## 2020-10-06 DIAGNOSIS — E669 Obesity, unspecified: Secondary | ICD-10-CM

## 2020-10-06 DIAGNOSIS — Z9181 History of falling: Secondary | ICD-10-CM

## 2020-10-06 DIAGNOSIS — E1169 Type 2 diabetes mellitus with other specified complication: Secondary | ICD-10-CM | POA: Diagnosis not present

## 2020-10-06 DIAGNOSIS — R531 Weakness: Secondary | ICD-10-CM

## 2020-10-06 DIAGNOSIS — K5900 Constipation, unspecified: Secondary | ICD-10-CM

## 2020-10-06 DIAGNOSIS — R197 Diarrhea, unspecified: Secondary | ICD-10-CM

## 2020-10-06 DIAGNOSIS — Z0000 Encounter for general adult medical examination without abnormal findings: Secondary | ICD-10-CM

## 2020-10-06 MED ORDER — METFORMIN HCL ER 500 MG PO TB24: 500.0000 mg | ORAL_TABLET | Freq: Every day | ORAL | 1 refills | Status: DC

## 2020-10-06 NOTE — Patient Instructions (Signed)
Preventive Care 79 Years and Older, Female Preventive care refers to lifestyle choices and visits with your health care provider that can promote health and wellness. This includes:  A yearly physical exam. This is also called an annual well check.  Regular dental and eye exams.  Immunizations.  Screening for certain conditions.  Healthy lifestyle choices, such as diet and exercise. What can I expect for my preventive care visit? Physical exam Your health care provider will check:  Height and weight. These may be used to calculate body mass index (BMI), which is a measurement that tells if you are at a healthy weight.  Heart rate and blood pressure.  Your skin for abnormal spots. Counseling Your health care provider may ask you questions about:  Alcohol, tobacco, and drug use.  Emotional well-being.  Home and relationship well-being.  Sexual activity.  Eating habits.  History of falls.  Memory and ability to understand (cognition).  Work and work Statistician.  Pregnancy and menstrual history. What immunizations do I need?  Influenza (flu) vaccine  This is recommended every year. Tetanus, diphtheria, and pertussis (Tdap) vaccine  You may need a Td booster every 10 years. Varicella (chickenpox) vaccine  You may need this vaccine if you have not already been vaccinated. Zoster (shingles) vaccine  You may need this after age 33. Pneumococcal conjugate (PCV13) vaccine  One dose is recommended after age 33. Pneumococcal polysaccharide (PPSV23) vaccine  One dose is recommended after age 72. Measles, mumps, and rubella (MMR) vaccine  You may need at least one dose of MMR if you were born in 1957 or later. You may also need a second dose. Meningococcal conjugate (MenACWY) vaccine  You may need this if you have certain conditions. Hepatitis A vaccine  You may need this if you have certain conditions or if you travel or work in places where you may be exposed  to hepatitis A. Hepatitis B vaccine  You may need this if you have certain conditions or if you travel or work in places where you may be exposed to hepatitis B. Haemophilus influenzae type b (Hib) vaccine  You may need this if you have certain conditions. You may receive vaccines as individual doses or as more than one vaccine together in one shot (combination vaccines). Talk with your health care provider about the risks and benefits of combination vaccines. What tests do I need? Blood tests  Lipid and cholesterol levels. These may be checked every 5 years, or more frequently depending on your overall health.  Hepatitis C test.  Hepatitis B test. Screening  Lung cancer screening. You may have this screening every year starting at age 39 if you have a 30-pack-year history of smoking and currently smoke or have quit within the past 15 years.  Colorectal cancer screening. All adults should have this screening starting at age 36 and continuing until age 15. Your health care provider may recommend screening at age 23 if you are at increased risk. You will have tests every 1-10 years, depending on your results and the type of screening test.  Diabetes screening. This is done by checking your blood sugar (glucose) after you have not eaten for a while (fasting). You may have this done every 1-3 years.  Mammogram. This may be done every 1-2 years. Talk with your health care provider about how often you should have regular mammograms.  BRCA-related cancer screening. This may be done if you have a family history of breast, ovarian, tubal, or peritoneal cancers.  Other tests  Sexually transmitted disease (STD) testing.  Bone density scan. This is done to screen for osteoporosis. You may have this done starting at age 44. Follow these instructions at home: Eating and drinking  Eat a diet that includes fresh fruits and vegetables, whole grains, lean protein, and low-fat dairy products. Limit  your intake of foods with high amounts of sugar, saturated fats, and salt.  Take vitamin and mineral supplements as recommended by your health care provider.  Do not drink alcohol if your health care provider tells you not to drink.  If you drink alcohol: ? Limit how much you have to 0-1 drink a day. ? Be aware of how much alcohol is in your drink. In the [place] one drink equals one 12 oz bottle of beer (355 mL), one 5 oz glass of wine (148 mL), or one 1 oz glass of hard liquor (44 mL). Lifestyle  Take daily care of your teeth and gums.  Stay active. Exercise for at least 30 minutes on 5 or more days each week.  Do not use any products that contain nicotine or tobacco, such as cigarettes, e-cigarettes, and chewing tobacco. If you need help quitting, ask your health care provider.  If you are sexually active, practice safe sex. Use a condom or other form of protection in order to prevent STIs (sexually transmitted infections).  Talk with your health care provider about taking a low-dose aspirin or statin. What's next?  Go to your health care provider once a year for a well check visit.  Ask your health care provider how often you should have your eyes and teeth checked.  Stay up to date on all vaccines. This information is not intended to replace advice given to you by your health care provider. Make sure you discuss any questions you have with your health care provider. Document Revised: 12/04/2018 Document Reviewed: 12/04/2018 Elsevier Patient Education  2020 [employer].

## 2020-10-06 NOTE — Assessment & Plan Note (Signed)
Patient encouraged to maintain heart healthy diet, regular exercise, adequate sleep. Consider daily probiotics. Take medications as prescribed. Labs ordered and reviewed 

## 2020-10-07 LAB — COMPREHENSIVE METABOLIC PANEL
AG Ratio: 1.5 (calc) (ref 1.0–2.5)
ALT: 17 U/L (ref 6–29)
AST: 24 U/L (ref 10–35)
Albumin: 4 g/dL (ref 3.6–5.1)
Alkaline phosphatase (APISO): 53 U/L (ref 37–153)
BUN: 14 mg/dL (ref 7–25)
CO2: 25 mmol/L (ref 20–32)
Calcium: 9.3 mg/dL (ref 8.6–10.4)
Chloride: 104 mmol/L (ref 98–110)
Creat: 0.62 mg/dL (ref 0.60–0.93)
Globulin: 2.6 g/dL (calc) (ref 1.9–3.7)
Glucose, Bld: 68 mg/dL (ref 65–99)
Potassium: 4.2 mmol/L (ref 3.5–5.3)
Sodium: 139 mmol/L (ref 135–146)
Total Bilirubin: 0.5 mg/dL (ref 0.2–1.2)
Total Protein: 6.6 g/dL (ref 6.1–8.1)

## 2020-10-07 LAB — HEMOGLOBIN A1C
Hgb A1c MFr Bld: 7.6 % of total Hgb — ABNORMAL HIGH (ref <5.7)
Mean Plasma Glucose: 171 (calc)
eAG (mmol/L): 9.5 (calc)

## 2020-10-07 LAB — MAGNESIUM: Magnesium: 1.8 mg/dL (ref 1.5–2.5)

## 2020-10-07 LAB — CBC
HCT: 38.1 % (ref 35.0–45.0)
Hemoglobin: 12.9 g/dL (ref 11.7–15.5)
MCH: 30.6 pg (ref 27.0–33.0)
MCHC: 33.9 g/dL (ref 32.0–36.0)
MCV: 90.5 fL (ref 80.0–100.0)
MPV: 9.5 fL (ref 7.5–12.5)
Platelets: 275 10*3/uL (ref 140–400)
RBC: 4.21 10*6/uL (ref 3.80–5.10)
RDW: 12.1 % (ref 11.0–15.0)
WBC: 6.8 10*3/uL (ref 3.8–10.8)

## 2020-10-07 LAB — LIPID PANEL
Cholesterol: 139 mg/dL (ref <200)
HDL: 49 mg/dL — ABNORMAL LOW (ref >=50)
LDL Cholesterol (Calc): 71 mg/dL (calc)
Non-HDL Cholesterol (Calc): 90 mg/dL (calc) (ref <130)
Total CHOL/HDL Ratio: 2.8 (calc) (ref <5.0)
Triglycerides: 109 mg/dL (ref <150)

## 2020-10-07 LAB — TSH: TSH: 1.55 mIU/L (ref 0.40–4.50)

## 2020-10-09 DIAGNOSIS — K219 Gastro-esophageal reflux disease without esophagitis: Secondary | ICD-10-CM | POA: Insufficient documentation

## 2020-10-09 HISTORY — DX: Gastro-esophageal reflux disease without esophagitis: K21.9

## 2020-10-09 NOTE — Assessment & Plan Note (Signed)
Tolerating statin, encouraged heart healthy diet, avoid trans fats, minimize simple carbs and saturated fats. Increase exercise as tolerated 

## 2020-10-09 NOTE — Assessment & Plan Note (Signed)
Struggling from weakness and unsteady gait, has improved int he past with a course of phsycial therapy. A new course of physical therapy is ordered and she will notify us if it worsens.

## 2020-10-09 NOTE — Assessment & Plan Note (Signed)
hgba1c acceptable and improving, minimize simple carbs. Increase exercise as tolerated. Continue current meds

## 2020-10-09 NOTE — Assessment & Plan Note (Signed)
Greatly improved. Avoid offending meds and foods

## 2020-10-09 NOTE — Progress Notes (Signed)
Subjective:    Patient ID: Gail Lester, female    DOB: 1941/09/09, 79 y.o.   MRN: [id number]  Chief Complaint  Patient presents with  . Annual Exam  . Metformin HCI-questions    HPI Patient is in today for annual preventative exam and follow up on chronic medical concerns. No recent febrile illness or hospitalizations. She has been eating well and staying active. Her sugars have been improving. She has noted some increase in indigestion recently the majority of symptoms lastest about 4 days but she did not some discomfort in her chest which resolved with Tums. No other acute concerns. Denies palp/SOB/HA/congestion/fevers or GU c/o. Taking meds as prescribed  Past Medical History:  Diagnosis Date  . Arthritis   . Chicken pox as a child  . Constipation 02/15/2017  . Cough 03/24/2015  . Diabetes ([place])   . Diabetes mellitus   . Diabetes mellitus type 2 in obese [facility]) 02/10/2013   Saw Dr Delman Cheadle, eye doctor in 01/01/13, exam in chart   . Diarrhea 02/10/2013  . Dupuytren's contracture of both hands 02/10/2013  . Encounter for Medicare annual wellness exam 08/04/2013  . Fibrocystic breast 02/10/2013   Left h/o FN biopsy   . Ganglion cyst 11/14/2014   Right 2nd finger  . Hypercholesteremia   . Keratosis, seborrheic 08/01/2015  . Knee pain, left 02/15/2017  . Left arm pain 11/14/2014  . Measles as a child  . Medicare annual wellness visit, subsequent 11/06/2015  . 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 Mumps as a child  . Osteopenia 05/23/2017  . Other and unspecified hyperlipidemia 02/10/2013  . Other malaise and fatigue 08/04/2013  . Overweight 06/14/2014  . Preventative health care 11/09/2016    Past Surgical History:  Procedure Laterality Date  . APPENDECTOMY  1955  . BREAST BIOPSY  1985-1990   benign  . EYE SURGERY Right 04/2019   cataract removal  . EYE SURGERY Left 03/2020   cataract removal  . ORIF WRIST FRACTURE  12/09/2011   Procedure: OPEN REDUCTION INTERNAL FIXATION  (ORIF) WRIST FRACTURE;  Surgeon: Linna Hoff;  Location: [place];  Service: Orthopedics;  Laterality: Left;    Family History  Problem Relation Age of Onset  . Kidney disease Mother   . Diabetes Father   . Kidney disease Maternal Grandmother   . Cancer Maternal Grandmother        bladder  . Cancer Maternal Aunt   . Alzheimer's disease Maternal Aunt   . Scoliosis Son   . Colon cancer Other        grandparent    Social History   Socioeconomic History  . Marital status: Divorced    Spouse name: Not on file  . Number of children: Not on file  . Years of education: Not on file  . Highest education level: Not on file  Occupational History  . Not on file  Tobacco Use  . Smoking status: Former Smoker    Quit date: 11/17/1989    Years since quitting: 30.9  . Smokeless tobacco: Former Network engineer and Sexual Activity  . Alcohol use: No  . Drug use: No  . Sexual activity: Not Currently  Other Topics Concern  . Not on file  Social History Narrative  . Not on file   Social Determinants of Health   Financial Resource Strain: Low Risk   . Difficulty of Paying Living Expenses: Not hard at all  Food Insecurity: No Food Insecurity  .  Worried About Charity fundraiser in the Last Year: Never true  . Ran Out of Food in the Last Year: Never true  Transportation Needs: No Transportation Needs  . Lack of Transportation (Medical): No  . Lack of Transportation (Non-Medical): No  Physical Activity:   . Days of Exercise per Week: Not on file  . Minutes of Exercise per Session: Not on file  Stress:   . Feeling of Stress : Not on file  Social Connections:   . Frequency of Communication with Friends and Family: Not on file  . Frequency of Social Gatherings with Friends and Family: Not on file  . Attends Religious Services: Not on file  . Active Member of Clubs or Organizations: Not on file  . Attends Archivist Meetings: Not on file  . Marital Status: Not on file    Intimate Partner Violence:   . Fear of Current or Ex-Partner: Not on file  . Emotionally Abused: Not on file  . Physically Abused: Not on file  . Sexually Abused: Not on file    Outpatient Medications Prior to Visit  Medication Sig Dispense Refill  . ACCU-CHEK AVIVA PLUS test strip CHECK BLOOD SUGAR 4 TIMES  DAILY AND AS NEEDED FOR  LABILE BLOOD SUGARS 100 each 11  . aspirin 81 MG tablet Take 81 mg by mouth daily.    . Blood Glucose Monitoring Suppl (ACCU-CHEK AVIVA PLUS) W/DEVICE KIT Use as directed 1 kit 0  . CALCIUM PO Take 400 mg by mouth daily.    . Cholecalciferol (VITAMIN D3) 50 MCG (2000 UT) TABS Take 1 tablet by mouth daily.    . Coenzyme Q10 (COQ10) 100 MG CAPS Take 1 capsule by mouth daily.    Marland Kitchen glimepiride (AMARYL) 4 MG tablet TAKE 1 TABLET BY MOUTH  TWICE DAILY 180 tablet 3  . Krill Oil 500 MG CAPS Take 1 capsule by mouth daily.    Marland Kitchen losartan (COZAAR) 25 MG tablet TAKE 1 TABLET BY MOUTH  DAILY 90 tablet 3  . LUTEIN PO Take 25 mg by mouth daily.    . Multiple Vitamin (MULTIVITAMIN) tablet Take 1 tablet by mouth daily.     . rosuvastatin (CRESTOR) 20 MG tablet TAKE ONE-HALF TABLET BY  MOUTH EVERY DAY 45 tablet 3  . tiZANidine (ZANAFLEX) 2 MG tablet Take 0.5-2 tablets (1-4 mg total) by mouth every 6 (six) hours as needed for muscle spasms. 30 tablet 1  . metFORMIN (GLUCOPHAGE-XR) 500 MG 24 hr tablet Take 500 mg by mouth daily.     No facility-administered medications prior to visit.    Allergies  Allergen Reactions  . Ramipril Diarrhea  . Lipitor [Atorvastatin]     Diarrhea, muscle cramps  . Lisinopril     myalgias    Review of Systems  Constitutional: Negative for chills, fever and malaise/fatigue.  HENT: Negative for congestion and hearing loss.   Eyes: Negative for discharge.  Respiratory: Negative for cough, sputum production and shortness of breath.   Cardiovascular: Negative for chest pain, palpitations and leg swelling.  Gastrointestinal: Negative for  abdominal pain, blood in stool, constipation, diarrhea, heartburn, nausea and vomiting.  Genitourinary: Negative for dysuria, frequency, hematuria and urgency.  Musculoskeletal: Negative for back pain, falls and myalgias.  Skin: Negative for rash.  Neurological: Negative for dizziness, sensory change, loss of consciousness, weakness and headaches.  Endo/Heme/Allergies: Negative for environmental allergies. Does not bruise/bleed easily.  Psychiatric/Behavioral: Negative for depression and suicidal ideas. The patient is not nervous/anxious  and does not have insomnia.        Objective:    Physical Exam Constitutional:      General: She is not in acute distress.    Appearance: She is well-developed.  HENT:     Head: Normocephalic and atraumatic.  Eyes:     Conjunctiva/sclera: Conjunctivae normal.  Neck:     Thyroid: No thyromegaly.  Cardiovascular:     Rate and Rhythm: Normal rate and regular rhythm.     Heart sounds: Normal heart sounds. No murmur heard.   Pulmonary:     Effort: Pulmonary effort is normal. No respiratory distress.     Breath sounds: Normal breath sounds.  Abdominal:     General: Bowel sounds are normal. There is no distension.     Palpations: Abdomen is soft. There is no mass.     Tenderness: There is no abdominal tenderness.  Musculoskeletal:     Cervical back: Neck supple.  Lymphadenopathy:     Cervical: No cervical adenopathy.  Skin:    General: Skin is warm and dry.  Neurological:     Mental Status: She is alert and oriented to person, place, and time.  Psychiatric:        Behavior: Behavior normal.     BP 119/69 (BP Location: Left Arm, Patient Position: Sitting, Cuff Size: Small)   Pulse 67   Temp 98.1 F (36.7 C) (Oral)   Resp 12   Ht $R'5\' 4"'Nu$  (1.626 m)   Wt 156 lb (70.8 kg)   SpO2 96%   BMI 26.78 kg/m  Wt Readings from Last 3 Encounters:  10/06/20 156 lb (70.8 kg)  04/05/20 161 lb 3.2 oz (73.1 kg)  10/05/19 159 lb 6 oz (72.3 kg)     Diabetic Foot Exam - Simple   No data filed     Lab Results  Component Value Date   WBC 6.8 10/06/2020   HGB 12.9 10/06/2020   HCT 38.1 10/06/2020   PLT 275 10/06/2020   GLUCOSE 68 10/06/2020   CHOL 139 10/06/2020   TRIG 109 10/06/2020   HDL 49 (L) 10/06/2020   LDLDIRECT 95.0 10/05/2019   LDLCALC 71 10/06/2020   ALT 17 10/06/2020   AST 24 10/06/2020   NA 139 10/06/2020   K 4.2 10/06/2020   CL 104 10/06/2020   CREATININE 0.62 10/06/2020   BUN 14 10/06/2020   CO2 25 10/06/2020   TSH 1.55 10/06/2020   HGBA1C 7.6 (H) 10/06/2020   MICROALBUR 1.6 11/03/2015    Lab Results  Component Value Date   TSH 1.55 10/06/2020   Lab Results  Component Value Date   WBC 6.8 10/06/2020   HGB 12.9 10/06/2020   HCT 38.1 10/06/2020   MCV 90.5 10/06/2020   PLT 275 10/06/2020   Lab Results  Component Value Date   NA 139 10/06/2020   K 4.2 10/06/2020   CO2 25 10/06/2020   GLUCOSE 68 10/06/2020   BUN 14 10/06/2020   CREATININE 0.62 10/06/2020   BILITOT 0.5 10/06/2020   ALKPHOS 51 04/05/2020   AST 24 10/06/2020   ALT 17 10/06/2020   PROT 6.6 10/06/2020   ALBUMIN 4.3 04/05/2020   CALCIUM 9.3 10/06/2020   GFR 82.12 04/05/2020   Lab Results  Component Value Date   CHOL 139 10/06/2020   Lab Results  Component Value Date   HDL 49 (L) 10/06/2020   Lab Results  Component Value Date   LDLCALC 71 10/06/2020   Lab Results  Component  Value Date   TRIG 109 10/06/2020   Lab Results  Component Value Date   CHOLHDL 2.8 10/06/2020   Lab Results  Component Value Date   HGBA1C 7.6 (H) 10/06/2020       Assessment & Plan:   Problem List Items Addressed This Visit    Diabetes mellitus type 2 in obese ([place]) - Primary    hgba1c acceptable and improving, minimize simple carbs. Increase exercise as tolerated. Continue current meds      Relevant Medications   metFORMIN (GLUCOPHAGE-XR) 500 MG 24 hr tablet   Other Relevant Orders   Hemoglobin A1c (Completed)    Comprehensive metabolic panel (Completed)   Hyperlipidemia, mixed    Tolerating statin, encouraged heart healthy diet, avoid trans fats, minimize simple carbs and saturated fats. Increase exercise as tolerated      Relevant Orders   Lipid panel (Completed)   Diarrhea    Greatly improved. Avoid offending meds and foods      Preventative health care    Patient encouraged to maintain heart healthy diet, regular exercise, adequate sleep. Consider daily probiotics. Take medications as prescribed. Labs ordered and reviewed.       Constipation   Muscle cramps   Relevant Orders   CBC (Completed)   Comprehensive metabolic panel (Completed)   TSH (Completed)   Magnesium (Completed)   Mild cognitive impairment    She is interested in neuropsychology testing to further evaluate. She is encouraged to stay active, eat a low inflammation diet and learn something new      Relevant Orders   Ambulatory referral to Neurology   Gait disorder    Struggling from weakness and unsteady gait, has improved int he past with a course of phsycial therapy. A new course of physical therapy is ordered and she will notify us if it worsens.       Acid reflux    She has had some recent episodes of burning in the chest which responded to Tums. She will notify us if she has recurrent episodes and she will avoid any offending foods.       Other Visit Diagnoses    Unsteady gait       Relevant Orders   Ambulatory referral to Physical Therapy   At high risk for falls       Relevant Orders   Ambulatory referral to Physical Therapy   Weakness       Relevant Orders   Ambulatory referral to Physical Therapy      I have changed Shalandra T. Tonelli's metFORMIN. I am also having her maintain her aspirin, multivitamin, CALCIUM PO, CoQ10, Accu-Chek Aviva Plus, Krill Oil, Accu-Chek Aviva Plus, losartan, rosuvastatin, Vitamin D3, LUTEIN PO, tiZANidine, and glimepiride.  Meds ordered this encounter  Medications  .  metFORMIN (GLUCOPHAGE-XR) 500 MG 24 hr tablet    Sig: Take 1 tablet (500 mg total) by mouth daily.    Dispense:  90 tablet    Refill:  1     Penni Homans, MD

## 2020-10-09 NOTE — Assessment & Plan Note (Signed)
She has had some recent episodes of burning in the chest which responded to Tums. She will notify us if she has recurrent episodes and she will avoid any offending foods.

## 2020-10-09 NOTE — Assessment & Plan Note (Signed)
She is interested in neuropsychology testing to further evaluate. She is encouraged to stay active, eat a low inflammation diet and learn something new

## 2020-10-10 ENCOUNTER — Encounter: Payer: Self-pay | Admitting: Psychology

## 2020-10-19 ENCOUNTER — Encounter: Payer: Self-pay | Admitting: Physical Therapy

## 2020-10-19 ENCOUNTER — Other Ambulatory Visit: Payer: Self-pay

## 2020-10-19 ENCOUNTER — Ambulatory Visit: Payer: Medicare Other | Attending: Family Medicine | Admitting: Physical Therapy

## 2020-10-19 DIAGNOSIS — R296 Repeated falls: Secondary | ICD-10-CM | POA: Insufficient documentation

## 2020-10-19 DIAGNOSIS — M6281 Muscle weakness (generalized): Secondary | ICD-10-CM | POA: Insufficient documentation

## 2020-10-19 DIAGNOSIS — R2689 Other abnormalities of gait and mobility: Secondary | ICD-10-CM | POA: Diagnosis not present

## 2020-10-19 NOTE — Therapy (Signed)
[place]. [place], [place], [address] Phone: [phone]   Fax:  [phone]  Physical Therapy Evaluation  Patient Details  Name: Gail Lester MRN: [id number] Date of Birth: 10-May-1941 No data recorded  Encounter Date: 10/19/2020   PT End of Session - 10/19/20 1156    Visit Number 1    Date for PT Re-Evaluation 12/19/20    PT Start Time 1100    PT Stop Time 1136    PT Time Calculation (min) 36 min    Activity Tolerance Patient tolerated treatment well    Behavior During Therapy [facility] for tasks assessed/performed           Past Medical History:  Diagnosis Date  . Arthritis   . Chicken pox as a child  . Constipation 02/15/2017  . Cough 03/24/2015  . Diabetes ([place])   . Diabetes mellitus   . Diabetes mellitus type 2 in obese [facility]) 02/10/2013   Saw Dr Delman Cheadle, eye doctor in 01/01/13, exam in chart   . Diarrhea 02/10/2013  . Dupuytren's contracture of both hands 02/10/2013  . Encounter for Medicare annual wellness exam 08/04/2013  . Fibrocystic breast 02/10/2013   Left h/o FN biopsy   . Ganglion cyst 11/14/2014   Right 2nd finger  . Hypercholesteremia   . Keratosis, seborrheic 08/01/2015  . Knee pain, left 02/15/2017  . Left arm pain 11/14/2014  . Measles as a child  . Medicare annual wellness visit, subsequent 11/06/2015  . 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 Mumps as a child  . Osteopenia 05/23/2017  . Other and unspecified hyperlipidemia 02/10/2013  . Other malaise and fatigue 08/04/2013  . Overweight 06/14/2014  . Preventative health care 11/09/2016    Past Surgical History:  Procedure Laterality Date  . APPENDECTOMY  1955  . BREAST BIOPSY  1985-1990   benign  . EYE SURGERY Right 04/2019   cataract removal  . EYE SURGERY Left 03/2020   cataract removal  . ORIF WRIST FRACTURE  12/09/2011   Procedure: OPEN REDUCTION INTERNAL FIXATION (ORIF) WRIST FRACTURE;  Surgeon: Linna Hoff;  Location: [place];  Service: Orthopedics;  Laterality: Left;    There were no vitals filed for this visit.    Subjective Assessment - 10/19/20 1101    Subjective Pt reports that she would like a baseline for balance and strength. Pt states that she had a fall about a year ago where she fractured her sacrum; states she still has some pain there sometimes. Pt reports one other fall when walking around the neighborhood over a year ago; not sure what happened but thinks she might have tripped over her feet. Pt reports some stumbles since then but no falls. Would like to try PT to remain active.    Patient Stated Goals reduce fall risk    Currently in Pain? No/denies              [facility] PT Assessment - 10/19/20 0001      Assessment   Medical Diagnosis gait/balance    Prior Therapy PT for R shoulder      Precautions   Precautions None      Restrictions   Weight Bearing Restrictions No      Balance Screen   Has the patient fallen in the past 6 months No    Has the patient had a decrease in activity level because of a fear of falling?  No    Is  the patient reluctant to leave their home because of a fear of falling?  No      Home Environment   Additional Comments no stairs at home      Prior Function   Level of Independence Independent    Vocation Retired    Leisure walking dog, estate Health and safety inspector Tests   Functional tests Single leg stance;Sit to Stand      Single Leg Stance   Comments decreased stability on R compared to L      Sit to Stand   Comments WFL; some difficulty with eccentric control with fatigue      Posture/Postural Control   Posture/Postural Control No significant limitations      ROM / Strength   AROM / PROM / Strength Strength      Strength   Strength Assessment Site Knee;Hip;Ankle    Right/Left Hip Right;Left    Right Hip Flexion 5/5    Right Hip Extension 4/5    Right Hip ABduction 4/5    Right Hip ADduction 5/5    Left Hip Flexion 4+/5    Left Hip  Extension 4/5    Left Hip ABduction 4/5    Left Hip ADduction 5/5    Right/Left Knee Right;Left    Right Knee Flexion 5/5    Right Knee Extension 5/5    Left Knee Flexion 4+/5    Left Knee Extension 4+/5    Right/Left Ankle Right;Left    Right Ankle Dorsiflexion 5/5    Left Ankle Dorsiflexion 5/5      Transfers   Five time sit to stand comments  WFL; some fatigue by 5th rep      Ambulation/Gait   Gait Comments decreased hip flexion/knee flexion leading to decreased foot clearance B      Standardized Balance Assessment   Standardized Balance Assessment Berg Balance Test      Berg Balance Test   Sit to Stand Able to stand without using hands and stabilize independently    Standing Unsupported Able to stand safely 2 minutes    Sitting with Back Unsupported but Feet Supported on Floor or Stool Able to sit safely and securely 2 minutes    Stand to Sit Sits safely with minimal use of hands    Transfers Able to transfer safely, minor use of hands    Standing Unsupported with Eyes Closed Able to stand 10 seconds safely    Standing Unsupported with Feet Together Able to place feet together independently and stand 1 minute safely    From Standing, Reach Forward with Outstretched Arm Can reach forward >12 cm safely (5")    From Standing Position, Pick up Object from Floor Able to pick up shoe safely and easily    From Standing Position, Turn to Look Behind Over each Shoulder Looks behind from both sides and weight shifts well    Turn 360 Degrees Able to turn 360 degrees safely in 4 seconds or less    Standing Unsupported, Alternately Place Feet on Step/Stool Able to stand independently and complete 8 steps >20 seconds    Standing Unsupported, One Foot in Front Able to plae foot ahead of the other independently and hold 30 seconds    Standing on One Leg Able to lift leg independently and hold 5-10 seconds    Total Score 52      High Level Balance   High Level Balance Comments rhomberg on  foam EO and EC; >10 sec  increased sway with EC; tandem walking firm surface some instability noted; SLS B <10 sec                      Objective measurements completed on examination: See above findings.       [facility] Adult PT Treatment/Exercise - 10/19/20 0001      High Level Balance   High Level Balance Comments SLS and tandem stance at counter; tandem walking at counter      Exercises   Exercises Knee/Hip      Knee/Hip Exercises: Standing   Hip Abduction Both;1 set;10 reps    Abduction Limitations red TB    Hip Extension Both;1 set;10 reps    Extension Limitations red TB      Knee/Hip Exercises: Seated   Sit to Sand 10 reps;without UE support                  PT Education - 10/19/20 1156    Education Details Pt educated on POC and HEP    Person(s) Educated Patient    Methods Explanation;Demonstration;Handout    Comprehension Verbalized understanding;Returned demonstration            PT Short Term Goals - 10/19/20 1253      PT SHORT TERM GOAL #1   Title Pt will be I with initial HEP    Time 2    Period Weeks    Status New    Target Date 11/02/20             PT Long Term Goals - 10/19/20 1253      PT LONG TERM GOAL #1   Title Pt will be I with advanced HEP    Time 6    Period Weeks    Status New    Target Date 11/30/20      PT LONG TERM GOAL #2   Title Pt will demo LLE strength equivalent to RLE    Time 6    Period Weeks    Status New    Target Date 11/30/20      PT LONG TERM GOAL #3   Title Pt will demo hip abd/ext strength 4+/5 B    Baseline 4/5 B    Time 6    Period Weeks    Status New    Target Date 11/30/20      PT LONG TERM GOAL #4   Title Pt will report no additional falls/stumbles and will report feeling stable with gait w ADLs    Time 6    Period Weeks    Status New    Target Date 11/30/20                  Plan - 10/19/20 1156    Clinical Impression Statement Pt presents to clinic with reports of  general unsteadiness and increased falls/near falls over the past 2 years. Pt leads very active lifestyle and is concerned about her ability to remain active at current level. Is reporting stumbles and "catching feet" with gait occurring more frequently over the past year. Pt demos score of 52/56 on BERG, WFL for modified CTSIB. Pt does demo mild LE strength deficits on L as compared to RLE, some instability with SLS, hip abd/ext weakness B, and decreased foot clearance with increasing fatigue. Plan to incoporate high level balance ex's, LE strengthening, and education on independent gym ex's for pt.    Examination-Activity Limitations Locomotion Level    Examination-Participation Restrictions  Community Activity;Interpersonal Relationship    Stability/Clinical Decision Making Stable/Uncomplicated    Clinical Decision Making Low    Rehab Potential Good    PT Frequency 1x / week    PT Duration 6 weeks    PT Treatment/Interventions ADLs/Self Care Home Management;Gait training;Stair training;Functional mobility training;Therapeutic activities;Therapeutic exercise;Balance training;Neuromuscular re-education;Patient/family education;Manual techniques    PT Next Visit Plan review and progress HEP, high level balance, LE strengthening, incoporate some UE strengthening    PT Home Exercise Plan STS, tandem stance, SLS, hip abd/ext with red TB    Consulted and Agree with Plan of Care Patient           Patient will benefit from skilled therapeutic intervention in order to improve the following deficits and impairments:  Abnormal gait, Difficulty walking, Decreased balance, Decreased strength  Visit Diagnosis: Recurrent falls  Muscle weakness (generalized)  Balance problem     Problem List Patient Active Problem List   Diagnosis Date Noted  . Acid reflux 10/09/2020  . Cataract 04/05/2020  . Gait disorder 08/26/2018  . Mild cognitive impairment 04/27/2018  . Muscle cramps 04/24/2018  . Hx of  colonic polyp 11/21/2017  . Osteopenia 05/23/2017  . Onychomycosis 02/24/2017  . Constipation 02/15/2017  . Acute pain of left knee 02/15/2017  . Preventative health care 11/09/2016  . Medicare annual wellness visit, subsequent 11/06/2015  . 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 Keratosis, seborrheic 08/01/2015  . Rotator cuff arthropathy 11/24/2014  . Left arm pain 11/14/2014  . Overweight 06/14/2014  . Other malaise and fatigue 08/04/2013  . Diarrhea 02/10/2013  . Diabetes mellitus type 2 in obese ([place]) 02/10/2013  . Hyperlipidemia, mixed 02/10/2013  . Fibrocystic breast 02/10/2013  . Dupuytren's contracture of both hands 02/10/2013   Amador Cunas, PT, DPT [facility]al 10/19/2020, 12:57 PM  [place]. [place], [place], [address] Phone: [phone]   Fax:  [phone]  Name: Gail Lester MRN: [id number] Date of Birth: [birth year]

## 2020-10-19 NOTE — Patient Instructions (Signed)
Access Code: [email or web address] URL: [email or web address] Date: 10/19/2020 Prepared by: Amador Cunas  Exercises Hip Abduction with Resistance Loop - 1 x daily - 4 x weekly - 3 sets - 10 reps Hip Extension with Resistance Loop - 1 x daily - 4 x weekly - 3 sets - 10 reps Sit to Stand without Arm Support - 1 x daily - 4 x weekly - 3 sets - 10 reps Standing Tandem Balance with Counter Support - 1 x daily - 7 x weekly - 3 sets - 2 reps - 20-30 sec hold Single Leg Stance with Support - 1 x daily - 7 x weekly - 3 sets - 2 reps - 20-30 sec hold

## 2020-10-20 ENCOUNTER — Other Ambulatory Visit: Payer: Self-pay | Admitting: Family Medicine

## 2020-10-27 ENCOUNTER — Ambulatory Visit: Payer: Medicare Other | Attending: Family Medicine | Admitting: Physical Therapy

## 2020-10-27 ENCOUNTER — Encounter: Payer: Self-pay | Admitting: Physical Therapy

## 2020-10-27 ENCOUNTER — Other Ambulatory Visit: Payer: Self-pay

## 2020-10-27 DIAGNOSIS — R296 Repeated falls: Secondary | ICD-10-CM | POA: Diagnosis not present

## 2020-10-27 DIAGNOSIS — R2689 Other abnormalities of gait and mobility: Secondary | ICD-10-CM | POA: Insufficient documentation

## 2020-10-27 DIAGNOSIS — M6281 Muscle weakness (generalized): Secondary | ICD-10-CM | POA: Insufficient documentation

## 2020-10-27 NOTE — Patient Instructions (Signed)
Access Code: [email or web address] URL: [email or web address] Date: 10/27/2020 Prepared by: Amador Cunas  Exercises Standing Shoulder Flexion to 90 Degrees with Dumbbells - 1 x daily - 4 x weekly - 3 sets - 10 reps Shoulder Abduction with Dumbbells - Palms Down - 1 x daily - 4 x weekly - 3 sets - 10 reps Shoulder Overhead Press in Abduction with Dumbbells - 1 x daily - 4 x weekly - 3 sets - 10 reps Seated Bent Over Shoulder Row with Dumbbells - 1 x daily - 7 x weekly - 3 sets - 10 reps

## 2020-10-27 NOTE — Therapy (Signed)
[place]. [place], [place], [address] Phone: [phone]   Fax:  [phone]  Physical Therapy Treatment  Patient Details  Name: Gail Lester MRN: [id number] Date of Birth: September 13, 1941 No data recorded  Encounter Date: 10/27/2020   PT End of Session - 10/27/20 1704    Visit Number 2    Date for PT Re-Evaluation 12/19/20    PT Start Time 1604    PT Stop Time 1700    PT Time Calculation (min) 56 min    Activity Tolerance Patient tolerated treatment well    Behavior During Therapy [facility] for tasks assessed/performed           Past Medical History:  Diagnosis Date  . Arthritis   . Chicken pox as a child  . Constipation 02/15/2017  . Cough 03/24/2015  . Diabetes ([place])   . Diabetes mellitus   . Diabetes mellitus type 2 in obese [facility]) 02/10/2013   Saw Dr Delman Cheadle, eye doctor in 01/01/13, exam in chart   . Diarrhea 02/10/2013  . Dupuytren's contracture of both hands 02/10/2013  . Encounter for Medicare annual wellness exam 08/04/2013  . Fibrocystic breast 02/10/2013   Left h/o FN biopsy   . Ganglion cyst 11/14/2014   Right 2nd finger  . Hypercholesteremia   . Keratosis, seborrheic 08/01/2015  . Knee pain, left 02/15/2017  . Left arm pain 11/14/2014  . Measles as a child  . Medicare annual wellness visit, subsequent 11/06/2015  . 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 Mumps as a child  . Osteopenia 05/23/2017  . Other and unspecified hyperlipidemia 02/10/2013  . Other malaise and fatigue 08/04/2013  . Overweight 06/14/2014  . Preventative health care 11/09/2016    Past Surgical History:  Procedure Laterality Date  . APPENDECTOMY  1955  . BREAST BIOPSY  1985-1990   benign  . EYE SURGERY Right 04/2019   cataract removal  . EYE SURGERY Left 03/2020   cataract removal  . ORIF WRIST FRACTURE  12/09/2011   Procedure: OPEN REDUCTION INTERNAL FIXATION (ORIF) WRIST FRACTURE;  Surgeon: Linna Hoff;  Location: [facility];   Service: Orthopedics;  Laterality: Left;    There were no vitals filed for this visit.   Subjective Assessment - 10/27/20 1614    Subjective Pt would like education on further ex's to do at home; requests PT every 2 weeks.    Currently in Pain? No/denies                             [facility] Adult PT Treatment/Exercise - 10/27/20 0001      High Level Balance   High Level Balance Activities Side stepping;Backward walking;Tandem walking    High Level Balance Comments tandem stance static and dynamic on foam balance beam; marching on airex; tandem/rhomberg on airex eyes closed CGA for all      Knee/Hip Exercises: Machines for Strengthening   Cybex Knee Extension 10# 2x10 BLE    Cybex Knee Flexion 25# 2x10 BLE    Other Machine rows and lats 20# 2x10      Knee/Hip Exercises: Standing   Heel Raises Both;1 set;10 reps    Hip Flexion Both;1 set;10 reps    Hip Flexion Limitations 3#    Hip Abduction Both;1 set    Abduction Limitations 3#    Hip Extension Both;1 set;10 reps    Extension Limitations 3#    Forward  Step Up Both;1 set;10 reps;Hand Hold: 0;Step Height: 6"    Walking with Sports Cord 30# x4 each direction                  PT Education - 10/27/20 1704    Education Details Pt educated on updated HEP    Person(s) Educated Patient    Methods Explanation;Demonstration;Handout    Comprehension Verbalized understanding;Returned demonstration            PT Short Term Goals - 10/27/20 1711      PT SHORT TERM GOAL #1   Title Pt will be I with initial HEP    Status Achieved             PT Long Term Goals - 10/19/20 1253      PT LONG TERM GOAL #1   Title Pt will be I with advanced HEP    Time 6    Period Weeks    Status New    Target Date 11/30/20      PT LONG TERM GOAL #2   Title Pt will demo LLE strength equivalent to RLE    Time 6    Period Weeks    Status New    Target Date 11/30/20      PT LONG TERM GOAL #3   Title Pt will demo  hip abd/ext strength 4+/5 B    Baseline 4/5 B    Time 6    Period Weeks    Status New    Target Date 11/30/20      PT LONG TERM GOAL #4   Title Pt will report no additional falls/stumbles and will report feeling stable with gait w ADLs    Time 6    Period Weeks    Status New    Target Date 11/30/20                 Plan - 10/27/20 1704    Clinical Impression Statement Pt tolerated progression to TE well. With resisted gait, demos decreased stability sidestepping to L. Occasional CGA for near LOB; able to self correct. Pt required CGA for balance activities on foam. Focused on hip/LE strength, functional strength, and balance activities this rx. Pt would like to attend PT every other week, so updated HEP to include greater number of strengthening ex's. Continue to progress next rx.    PT Treatment/Interventions ADLs/Self Care Home Management;Gait training;Stair training;Functional mobility training;Therapeutic activities;Therapeutic exercise;Balance training;Neuromuscular re-education;Patient/family education;Manual techniques    PT Next Visit Plan review and progress HEP, high level balance, LE strengthening, incoporate some UE strengthening    Consulted and Agree with Plan of Care Patient           Patient will benefit from skilled therapeutic intervention in order to improve the following deficits and impairments:  Abnormal gait, Difficulty walking, Decreased balance, Decreased strength  Visit Diagnosis: Recurrent falls  Muscle weakness (generalized)  Balance problem     Problem List Patient Active Problem List   Diagnosis Date Noted  . Acid reflux 10/09/2020  . Cataract 04/05/2020  . Gait disorder 08/26/2018  . Mild cognitive impairment 04/27/2018  . Muscle cramps 04/24/2018  . Hx of colonic polyp 11/21/2017  . Osteopenia 05/23/2017  . Onychomycosis 02/24/2017  . Constipation 02/15/2017  . Acute pain of left knee 02/15/2017  . Preventative health care  11/09/2016  . Medicare annual wellness visit, subsequent 11/06/2015  . 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 Keratosis, seborrheic 08/01/2015  .  Rotator cuff arthropathy 11/24/2014  . Left arm pain 11/14/2014  . Overweight 06/14/2014  . Other malaise and fatigue 08/04/2013  . Diarrhea 02/10/2013  . Diabetes mellitus type 2 in obese ([place]) 02/10/2013  . Hyperlipidemia, mixed 02/10/2013  . Fibrocystic breast 02/10/2013  . Dupuytren's contracture of both hands 02/10/2013   Amador Cunas, PT, DPT Donald Prose Adah Stoneberg 10/27/2020, 5:12 PM  [place]. [place], [place], [address] Phone: [phone]   Fax:  [phone]  Name: Gail Lester MRN: [id number] Date of Birth: 1941/11/12

## 2020-11-03 ENCOUNTER — Encounter: Payer: Medicare Other | Admitting: Physical Therapy

## 2020-11-07 ENCOUNTER — Telehealth: Payer: Self-pay | Admitting: Pharmacist

## 2020-11-07 NOTE — Progress Notes (Addendum)
Chronic Care Management Pharmacy Assistant   Name: Gail Lester  MRN: [id number] DOB: [birth year]  Reason for Encounter: Medication Review/ Initial Questions for Pharmacist appointment on 11-08-20 at 9 am.  Patient Questions:  1.  Have you seen any other providers since your last visit? NO  2.  Any changes in your medicines or health? NO  PCP : Mosie Lukes, MD  Allergies:   Allergies  Allergen Reactions  . Ramipril Diarrhea  . Lipitor [Atorvastatin]     Diarrhea, muscle cramps  . Lisinopril     myalgias    Medications: Outpatient Encounter Medications as of 11/07/2020  Medication Sig  . ACCU-CHEK AVIVA PLUS test strip CHECK BLOOD SUGAR 4 TIMES  DAILY AND AS NEEDED FOR  LABILE BLOOD SUGARS  . aspirin 81 MG tablet Take 81 mg by mouth daily.  . Blood Glucose Monitoring Suppl (ACCU-CHEK AVIVA PLUS) W/DEVICE KIT Use as directed  . CALCIUM PO Take 400 mg by mouth daily.  . Cholecalciferol (VITAMIN D3) 50 MCG (2000 UT) TABS Take 1 tablet by mouth daily.  . Coenzyme Q10 (COQ10) 100 MG CAPS Take 1 capsule by mouth daily.  Marland Kitchen glimepiride (AMARYL) 4 MG tablet TAKE 1 TABLET BY MOUTH  TWICE DAILY  . Krill Oil 500 MG CAPS Take 1 capsule by mouth daily.  Marland Kitchen losartan (COZAAR) 25 MG tablet TAKE 1 TABLET BY MOUTH  DAILY  . LUTEIN PO Take 25 mg by mouth daily.  . metFORMIN (GLUCOPHAGE-XR) 500 MG 24 hr tablet Take 1 tablet (500 mg total) by mouth daily.  . Multiple Vitamin (MULTIVITAMIN) tablet Take 1 tablet by mouth daily.   . rosuvastatin (CRESTOR) 20 MG tablet TAKE ONE-HALF TABLET BY  MOUTH DAILY  . tiZANidine (ZANAFLEX) 2 MG tablet Take 0.5-2 tablets (1-4 mg total) by mouth every 6 (six) hours as needed for muscle spasms.   No facility-administered encounter medications on file as of 11/07/2020.    Current Diagnosis: Patient Active Problem List   Diagnosis Date Noted  . Acid reflux 10/09/2020  . Cataract 04/05/2020  . Gait disorder 08/26/2018  . Mild cognitive impairment  04/27/2018  . Muscle cramps 04/24/2018  . Hx of colonic polyp 11/21/2017  . Osteopenia 05/23/2017  . Onychomycosis 02/24/2017  . Constipation 02/15/2017  . Acute pain of left knee 02/15/2017  . Preventative health care 11/09/2016  . Medicare annual wellness visit, subsequent 11/06/2015  . 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 Keratosis, seborrheic 08/01/2015  . Rotator cuff arthropathy 11/24/2014  . Left arm pain 11/14/2014  . Overweight 06/14/2014  . Other malaise and fatigue 08/04/2013  . Diarrhea 02/10/2013  . Diabetes mellitus type 2 in obese ([place]) 02/10/2013  . Hyperlipidemia, mixed 02/10/2013  . Fibrocystic breast 02/10/2013  . Dupuytren's contracture of both hands 02/10/2013    Goals Addressed   None    Have you seen any other providers since your last visit? No Any changes in your medications or health? No Any side effects from any medications? No Do you have an symptoms or problems not managed by your medications? Reports she has an abunance of metformin on hand. Any concerns about your health right now? Does have fear of falling.  States she practice balance exercising.  Has your provider asked that you check blood pressure, blood sugar, or follow special diet at home?  No Do you get any type of exercise on a regular basis? Walk twice a day about 1-2 miles Can you think of  a goal you would like to reach for your health? Have leg strength. Do you have any problems getting your medications? No Is there anything that you would like to discuss during the appointment? Advice about medications.  Advise patient to please bring medications and supplements to appointment.  Follow-Up:  Pharmacist Review   [place] Shannon, [facility] [place] Pharmacist Assistant [phone]  Reviewed by: De Blanch, PharmD Clinical Pharmacist [facility] at [facility] [phone]

## 2020-11-08 ENCOUNTER — Ambulatory Visit: Payer: Medicare Other | Admitting: Pharmacist

## 2020-11-08 ENCOUNTER — Other Ambulatory Visit: Payer: Self-pay

## 2020-11-08 VITALS — BP 122/58 | HR 70 | Temp 98.1°F | Wt 157.2 lb

## 2020-11-08 DIAGNOSIS — M8580 Other specified disorders of bone density and structure, unspecified site: Secondary | ICD-10-CM

## 2020-11-08 DIAGNOSIS — E782 Mixed hyperlipidemia: Secondary | ICD-10-CM

## 2020-11-08 DIAGNOSIS — E1169 Type 2 diabetes mellitus with other specified complication: Secondary | ICD-10-CM

## 2020-11-08 NOTE — Patient Instructions (Signed)
**Note Gail-Identified via Obfuscation** Visit Information  Goals Addressed            This Visit's Progress   . Chronic Care Management Pharmacy Care Plan       CARE PLAN ENTRY (see longitudinal plan of care for additional care plan information)  Current Barriers:  . Chronic Disease Management support, education, and care coordination needs related to Hyperlipidemia, Diabetes, Osteopenia   Hypertension Screening BP Readings from Last 3 Encounters:  11/08/20 (!) 122/58  10/06/20 119/69  04/05/20 116/60   . Pharmacist Clinical Goal(s): o Over the next 180 days, patient will work with PharmD and providers to maintain BP goal <140/90 . Current regimen:  o Losartan 25mg  daily . Interventions: o Discussed BP goal . Patient self care activities - Over the next 180 days, patient will: o Maintain hypertension medication regimen.  o Check blood pressure if you start to feel "swimmy headed"  Hyperlipidemia Lab Results  Component Value Date/Time   LDLCALC 71 10/06/2020 02:23 PM   LDLDIRECT 95.0 10/05/2019 02:44 PM   . Pharmacist Clinical Goal(s): o Over the next 180 days, patient will work with PharmD and providers to maintain LDL goal < 100 . Current regimen:  . Rosuvastatin 20mg  1/2 tab (10mg ) daily . Coenzyme Q10 100mg  daily . Krill Oil 500mg  daily . Interventions: o Discussed LDL goal o Collaboration with provider regarding medication management (rosuvastatin 10mg  daily to replace #1/2 tab of 20mg  rosuvastatin) . Patient self care activities - Over the next 180 days, patient will: o Maintain cholesterol medication regimen.  o Receive updated prescription for rosuvastatin 10mg  to replace 20mg  tablet  Diabetes Lab Results  Component Value Date/Time   HGBA1C 7.6 (H) 10/06/2020 02:23 PM   HGBA1C 7.8 (H) 04/05/2020 03:09 PM   . Pharmacist Clinical Goal(s): o Over the next 180 days, patient will work with PharmD and providers to achieve A1c goal  7.5% . Current regimen:  Marland Kitchen Glimepiride 4mg  twice  daily . Metformin XR 500mg  daily . Interventions: o Discussed DM goal o Consider increasing metformin to 500mg  twice daily o Coordinated with mail order pharmacy to hold metformin delivery noting pt's surplus of metformin . Patient self care activities - Over the next 180 days, patient will: o Check blood sugar once daily, document, and provide at future appointments o Contact provider with any episodes of hypoglycemia o Increase metformin to 500mg  daily o Contact mail order pharmacy when metformin supply starts to run low  Osteopenia . Pharmacist Clinical Goal(s) o Over the next 180 days, patient will work with PharmD and providers to reduce risk of fracture due to osteopenia . Current regimen:  . Calcium 630mg  daily . Vitamin D 2000 units daily . Interventions: o Discussed intake of 1200mg  of calcium daily through diet and/or supplementation o Discussed intake of [phone] units of vitamin D through supplementation  o Consider getting combined calcium/vitamin D supplement o Consider repeat DEXA Scan . Patient self care activities - Over the next 180 days, patient will: o Consider purchasing combined calcium and vitamin D supplement o Consider repeat DEXA Scan  Medication management . Pharmacist Clinical Goal(s): o Over the next 180 days, patient will work with PharmD and providers to achieve optimal medication adherence . Current pharmacy: [facility] . Interventions o Comprehensive medication review performed. o Continue current medication management strategy . Patient self care activities - Over the next 180 days, patient will: o Focus on medication adherence by filling and taking medications appropriately  o Take medications as prescribed  o Report any questions or concerns to PharmD and/or provider(s)  Initial goal documentation        Gail Lester was given information about Chronic Care Management services today including:  1. CCM service includes personalized  support from designated clinical staff supervised by her physician, including individualized plan of care and coordination with other care providers 2. 24/7 contact phone numbers for assistance for urgent and routine care needs. 3. Standard insurance, coinsurance, copays and deductibles apply for chronic care management only during months in which we provide at least 20 minutes of these services. Most insurances cover these services at 100%, however patients may be responsible for any copay, coinsurance and/or deductible if applicable. This service may help you avoid the need for more expensive face-to-face services. 4. Only one practitioner may furnish and bill the service in a calendar month. 5. The patient may stop CCM services at any time (effective at the end of the month) by phone call to the office staff.  Patient agreed to services and verbal consent obtained.   The patient verbalized understanding of instructions, educational materials, and care plan provided today and agreed to receive a mailed copy of patient instructions, educational materials, and care plan.  Face to Face appointment with pharmacist scheduled for:  05/10/2021  Gail Lester, PharmD Clinical Pharmacist [name] at [facility] [phone]
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
## 2020-11-09 ENCOUNTER — Other Ambulatory Visit: Payer: Self-pay

## 2020-11-09 DIAGNOSIS — E782 Mixed hyperlipidemia: Secondary | ICD-10-CM

## 2020-11-09 DIAGNOSIS — E1169 Type 2 diabetes mellitus with other specified complication: Secondary | ICD-10-CM

## 2020-11-10 ENCOUNTER — Ambulatory Visit: Payer: Medicare Other | Admitting: Physical Therapy

## 2020-11-15 ENCOUNTER — Encounter: Payer: Medicare Other | Admitting: Physical Therapy

## 2020-11-21 ENCOUNTER — Encounter: Payer: Self-pay | Admitting: Psychology

## 2020-11-21 ENCOUNTER — Ambulatory Visit: Payer: Medicare Other | Admitting: Psychology

## 2020-11-21 ENCOUNTER — Other Ambulatory Visit: Payer: Self-pay

## 2020-11-21 ENCOUNTER — Ambulatory Visit (INDEPENDENT_AMBULATORY_CARE_PROVIDER_SITE_OTHER): Payer: Medicare Other | Admitting: Psychology

## 2020-11-21 DIAGNOSIS — G3184 Mild cognitive impairment, so stated: Secondary | ICD-10-CM

## 2020-11-21 DIAGNOSIS — R4189 Other symptoms and signs involving cognitive functions and awareness: Secondary | ICD-10-CM

## 2020-11-21 HISTORY — DX: Mild cognitive impairment of uncertain or unknown etiology: G31.84

## 2020-11-21 NOTE — Progress Notes (Signed)
NEUROPSYCHOLOGICAL EVALUATION [place] Department of Neurology  Date of Evaluation: November 21, 2020  Reason for Referral:   Gail Lester is a 79 y.o. right-handed Caucasian female referred by Penni Homans, M.D., to characterize her current cognitive functioning and assist with diagnostic clarity and treatment planning in the context of subjective cognitive decline.   Assessment and Plan:   Clinical Impression(s): Gail Lester's pattern of performance is suggestive of an isolated impairment across encoding (i.e., learning) and retrieval aspects of both verbal and visual memory. Performances across yes/no recognition tasks were variable, but largely appropriate. She also exhibited an isolated weakness across a computerized task assessing hypothesis testing and adaptability; however, performance across all other executive functioning measures was strong. Performance was further appropriate across processing speed, attention/concentration, receptive and expressive language, and visuospatial abilities. Gail Lester lives alone and denied difficulties completing instrumental activities of daily living (ADLs) independently. She also performed well on a task assessing safety/judgment. As such, given evidence for cognitive dysfunction described above, she meets criteria for a Mild Neurocognitive Disorder (formerly "mild cognitive impairment"). The mild nature of this diagnosis should be emphasized at the present time.   The etiology of memory deficits is unclear. There is no neuroimaging available for me to review to examine possible anatomical correlates. Despite difficulties learning information and retrieving previously learned information on demand, her retention percentages across several tasks were quite strong, as were performances across yes/no recognition trials. This does not suggest a memory storage deficit and is inconsistent with the typical pattern of Alzheimer's  disease. She also performed very strongly across other non-memory tasks commonly implicated early on in this condition. Despite this, I certainly cannot rule out the very early stages of this condition and continued medical monitoring will be important moving forward. Her cognitive profile and behavioral characteristics are not concerning for Lewy body or frontotemporal dementia. Her history of diabetes can certainly affect cognitive functioning, especially if managed poorly. However, the largely isolated nature of memory dysfunction would be abnormal if related to this medical condition alone.  Recommendations: A repeat neuropsychological evaluation in 18-24 months (or sooner if functional decline is noted) is recommended to assess the trajectory of future cognitive decline should it occur. This will also aid in future efforts towards improved diagnostic clarity.  There was no neuroimaging available for my review. I would recommend that Gail Lester be referred for a brain MRI to determine potential anatomical correlates for memory dysfunction. She could also discuss the necessity of a referral to a neurologist with her PCP. I would not strongly advocate for the initiation of memory-based medications at this time; however, I certainly defer to her PCP and/or future neurologist should they feel differently.   Gail Lester should also be referred for an audiologic evaluation. Uncorrected hearing loss has been linked to future cognitive decline across several empirical studies. As such, evaluating and addressing hearing loss will be important moving forward.   If not already done, Gail Lester and her family may want to discuss her wishes regarding durable power of attorney and medical decision making so that she can have input into these choices.  Gail Lester is encouraged to attend to lifestyle factors for brain health (e.g., regular physical exercise, good nutrition habits, regular participation in  cognitively-stimulating activities, and general stress management techniques), which are likely to have benefits for both emotional adjustment and cognition. In fact, in addition to promoting good general health, regular exercise incorporating aerobic activities (e.g., brisk  walking, jogging, cycling, etc.) has been demonstrated to be a very effective treatment for depression and stress, with similar efficacy rates to both antidepressant medication and psychotherapy. Optimal control of vascular risk factors (including safe cardiovascular exercise and adherence to dietary recommendations) is encouraged.   If interested, there are some activities which have therapeutic value and can be useful in keeping her cognitively stimulated. For suggestions, Gail Lester is encouraged to go to the following website: https://www.[facility].org/get-to-know-barrow/centers-programs/neurorehabilitation-center/neuro-rehab-apps-and-games/ which has options, categorized by level of difficulty. It should be noted that these activities should not be viewed as a substitute for therapy.  When learning new information, she would benefit from information being broken up into small, manageable pieces. She may also find it helpful to articulate the material in her own words and in a context to promote encoding at the onset of a new task. This material may need to be repeated multiple times to promote encoding.  Memory can be improved using internal strategies such as rehearsal, repetition, chunking, mnemonics, association, and imagery. External strategies such as written notes in a consistently used memory journal, visual and nonverbal auditory cues such as a calendar on the refrigerator or appointments with alarm, such as on a cell phone, can also help maximize recall.  Review of Records:   Gail Lester was seen by [facility] Penni Homans, M.D.) on 10/06/2020 for her annual exam. Gail Lester was noted to have been eating well and  staying active. She has a history of type 2 diabetes and her sugars have been improving. She did note a recent increase in indigestion with the majority of symptoms lasting about four days. She also noted some discomfort in her chest which was resolved with Tums. She denied any other acute concerns and reported taking her medications as prescribed. Dr. Frederik Pear notes mention a history of mild cognitive impairment; however, no further details are provided. Ultimately, Ms. Laye was referred for a comprehensive neuropsychological evaluation to characterize her cognitive abilities and to assist with diagnostic clarity and treatment planning.   No neuroimaging was available for review.   Past Medical History:  Diagnosis Date  . Arthritis   . Constipation   . Diarrhea   . Dupuytren's contracture of both hands 02/10/2013  . Epidermal cyst 01/21/2017  . Fibrocystic breast 02/10/2013   Left h/o FN biopsy   . Ganglion cyst 11/14/2014   Right 2nd finger  . GERD (gastroesophageal reflux disease) 10/09/2020  . Hemangioma 09/28/2014  . History of cataracts    Previous surgery to remove bilaterally  . History of chicken pox    as a child  . History of colonic polyp 11/21/2017  . History of measles    as a child  . History of mumps    as a child  . Hypercholesteremia   . Hyperlipidemia, mixed 02/10/2013  . Melanocytic nevi, unspecified 09/28/2014  . 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 Onychomycosis 02/24/2017  . Osteopenia 05/23/2017  . Seborrheic keratosis 09/28/2014  . Type 2 diabetes mellitus 02/10/2013    Past Surgical History:  Procedure Laterality Date  . APPENDECTOMY  1955  . BREAST BIOPSY  1985-1990   benign  . EYE SURGERY Right 04/2019   cataract removal  . EYE SURGERY Left 03/2020   cataract removal  . ORIF WRIST FRACTURE  12/09/2011   Procedure: OPEN REDUCTION INTERNAL FIXATION (ORIF) WRIST FRACTURE;  Surgeon: Linna Hoff;  Location: [facility];  Service: Orthopedics;  Laterality:  Left;    Current  Outpatient Medications:  .  ACCU-CHEK AVIVA PLUS test strip, CHECK BLOOD SUGAR 4 TIMES  DAILY AND AS NEEDED FOR  LABILE BLOOD SUGARS, Disp: 100 each, Rfl: 11 .  aspirin 81 MG tablet, Take 81 mg by mouth daily., Disp: , Rfl:  .  Blood Glucose Monitoring Suppl (ACCU-CHEK AVIVA PLUS) W/DEVICE KIT, Use as directed, Disp: 1 kit, Rfl: 0 .  CALCIUM PO, Take 630 mg by mouth daily. , Disp: , Rfl:  .  Cholecalciferol (VITAMIN D3) 50 MCG (2000 UT) TABS, Take 1 tablet by mouth daily., Disp: , Rfl:  .  Coenzyme Q10 (COQ10) 100 MG CAPS, Take 1 capsule by mouth daily., Disp: , Rfl:  .  glimepiride (AMARYL) 4 MG tablet, TAKE 1 TABLET BY MOUTH  TWICE DAILY, Disp: 180 tablet, Rfl: 3 .  Krill Oil 500 MG CAPS, Take 1 capsule by mouth daily., Disp: , Rfl:  .  losartan (COZAAR) 25 MG tablet, TAKE 1 TABLET BY MOUTH  DAILY, Disp: 90 tablet, Rfl: 3 .  LUTEIN PO, Take 25 mg by mouth daily., Disp: , Rfl:  .  metFORMIN (GLUCOPHAGE-XR) 500 MG 24 hr tablet, Take 1 tablet (500 mg total) by mouth daily., Disp: 90 tablet, Rfl: 1 .  Multiple Vitamin (MULTIVITAMIN) tablet, Take 1 tablet by mouth daily. , Disp: , Rfl:  .  rosuvastatin (CRESTOR) 20 MG tablet, TAKE ONE-HALF TABLET BY  MOUTH DAILY, Disp: 45 tablet, Rfl: 3 .  tiZANidine (ZANAFLEX) 2 MG tablet, Take 0.5-2 tablets (1-4 mg total) by mouth every 6 (six) hours as needed for muscle spasms., Disp: 30 tablet, Rfl: 1  Clinical Interview:   The following information was obtained during a clinical interview with Ms. Laster prior to cognitive testing.  Cognitive Symptoms: Decreased short-term memory: Endorsed. She reported generalized symptoms of short-term memory loss. Specific examples included entering a room and forgetting her original intention, as well as occasional instances where she will misplace something or have trouble recalling the details of prior conversations. She also noted increased difficulties operating her smart phone; however, trouble  operating her PC was denied.  Decreased long-term memory: Denied. Decreased attention/concentration: Denied. Reduced processing speed: Endorsed. Difficulties with executive functions: Denied. Overt personality changes were likewise denied.  Difficulties with emotion regulation: Denied. Difficulties with receptive language: Denied assuming she can hear the source of the sound adequately.  Difficulties with word finding: Endorsed. Symptoms were mild and said to happen "now and then." She also reported having an effective mental strategy to aid with these occurrences.  Decreased visuoperceptual ability: Denied.  Trajectory of deficits: Memory concerns were said to be present for the past 6-12 months. They were said to have gradually worsened over time and Ms. Woodfield was unsure if they represented normal, age-related decline or something more concerning.   Difficulties completing ADLs: Denied.  Additional Medical History: History of traumatic brain injury/concussion: Denied. History of stroke: Denied. History of seizure activity: Denied. History of known exposure to toxins: Denied. Symptoms of chronic pain: Denied. Experience of frequent headaches/migraines: Denied. She reported a remote history of frequent headaches but stated that these symptoms have significantly improved over the past 3-4 years.  Frequent instances of dizziness/vertigo: Denied. Infrequent occurrences were said to be related to blood sugar levels and history of diabetes.   Sensory changes: She reported ongoing hearing loss but had yet been formally evaluated by an audiologist. She reported having bilateral cataract surgery within the past 12 months and described her current vision as much improved. Other sensory  changes/difficulties (e.g., taste or smell) were denied.  Balance/coordination difficulties: Endorsed. She described her balance as a "little shaky" at times but denied recent falls. She was previously referred to  outpatient PT. However, these sessions were discontinued as Ms. Profeta did not feel that her and her therapist were working on exercises which were beneficial to her functioning. Ms. Trinidad reported being open to resuming PT services at a different facility in the future.  Other motor difficulties: Denied.  Sleep History: Estimated hours obtained each night: 6-7 hours.  Difficulties falling asleep: Denied. Difficulties staying asleep: Denied. Feels rested and refreshed upon awakening: Endorsed.  History of snoring: Unknown as she lives alone. However, she reported occasionally waking up with a dry mouth and suspects that she may snore.  History of waking up gasping for air: Denied. Witnessed breath cessation while asleep: Denied.  History of vivid dreaming: Denied. Excessive movement while asleep: Denied. Instances of acting out her dreams: Denied.  Psychiatric/Behavioral Health History: Depression: She described her current mood as "up and down," attributing this to ongoing frustration stemming from cognitive inefficiencies and trouble operating her smart phone. She denied current or remote depressive symptoms or prior concerns surrounding a major depressive disorder. Current or remote suicidal ideation, intent, or plan was likewise denied.  Anxiety: She reported ongoing symptoms of generalized anxiety, likely related to frustration given factors described above. These represent a newer symptom and she denied a history of an anxiety disorder in the past.  Mania: Denied. Trauma History: Denied. Visual/auditory hallucinations: Denied. Delusional thoughts: Denied.  Tobacco: Denied. She reported quitting approximately 30-35 years previously.  Alcohol: She reported very rare alcohol consumption while in social settings and denied a history of problematic alcohol abuse or dependence.  Recreational drugs: Denied. Caffeine: She reported being "hooked" on coffee but has recently made efforts to  reduce her intake. She noted switching to 1/2 caffeinated coffee and not consuming any after 6:00pm. She estimated currently consuming about a pot per day.   Family History: Problem Relation Age of Onset  . Kidney disease Mother   . Alcoholism Mother   . Diabetes Father   . Kidney disease Maternal Grandmother   . Cancer Maternal Grandmother        bladder  . Cancer Maternal Aunt   . Alzheimer's disease Maternal Aunt   . Scoliosis Son   . Colon cancer Other        grandparent   This information was confirmed by Ms. Main.  Academic/Vocational History: Highest level of educational attainment: 13.5 years. She graduated from high school and completed an additional 1.5 years of college. She described herself as a good (A/B) student in academic settings and ultimately left college due to financial limitations. English/writing-based courses were noted as relative strengths.  History of developmental delay: Denied. History of grade repetition: Denied. Enrollment in special education courses: Denied. History of LD/ADHD: Denied.  Employment: Semi-retired. She worked for an extended time in a Associate Professor. 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 She denied ongoing difficulties with work-related tasks.   Evaluation Results:   Behavioral Observations: Ms. Mclees was unaccompanied, arrived to her appointment on time, and was appropriately dressed and groomed. She appeared alert and oriented. Observed gait and station were within normal limits. Gross motor functioning appeared intact upon informal observation and no abnormal movements (e.g., tremors) were noted. Her affect was generally relaxed and positive, but did range appropriately given the subject being  discussed during the clinical interview or the task at hand during testing procedures. Spontaneous speech was fluent and word finding difficulties were not observed during the clinical  interview. Thought processes were coherent, organized, and normal in content. Insight into her cognitive difficulties appeared adequate. During testing, sustained attention was appropriate. Task engagement was adequate and she persisted when challenged. Overall, Ms. Podgurski was cooperative with the clinical interview and subsequent testing procedures.   Adequacy of Effort: The validity of neuropsychological testing is limited by the extent to which the individual being tested may be assumed to have exerted adequate effort during testing. Ms. Anspaugh expressed her intention to perform to the best of her abilities and exhibited adequate task engagement and persistence. Scores across stand-alone and embedded performance validity measures were within expectation. As such, the results of the current evaluation are believed to be a valid representation of Ms. Deahl's current cognitive functioning.  Test Results: Ms. Clendenin was fully oriented at the time of the current evaluation.  Intellectual abilities based upon educational and vocational attainment were estimated to be in the average range. Premorbid abilities were estimated to be within the above average range based upon a single-word reading test.   Processing speed was average to well above average. Basic attention was exceptionally high. More complex attention (e.g., working memory) was well above average. Executive functioning was largely above average. However, she did demonstrate an isolated weakness across a computerized task assessing hypothesis testing and adaptability. Performance across a task assessing safety/judgment was well above average.   Assessed receptive language abilities were above average. Likewise, Ms. Launer did not exhibit any difficulties comprehending task instructions and answered all questions asked of her appropriately. Assessed expressive language (e.g., verbal fluency and confrontation naming) was average to above average.       Assessed visuospatial/visuoconstructional abilities were average to above average.    Learning (i.e., encoding) of novel verbal and visual information was variable, with scores generally ranging from the well below average to below average normative ranges. Spontaneous delayed recall (i.e., retrieval) of previously learned information was exceptionally low to well below average across verbal measures, but average across a visual task. Retention rates were 67% across a story learning task, 150% across a list learning task, and 133% across a shape learning task. Performance across recognition tasks was generally average, suggesting some evidence for information consolidation.   Results of emotional screening instruments suggested that recent symptoms of generalized anxiety were in the minimal range, while symptoms of depression were within normal limits. A screening instrument assessing recent sleep quality suggested the presence of minimal sleep dysfunction.  Tables of Scores:   Note: This summary of test scores accompanies the interpretive report and should not be considered in isolation without reference to the appropriate sections in the text. Descriptors are based on appropriate normative data and may be adjusted based on clinical judgment. The terms "impaired" and "within normal limits (WNL)" are used when a more specific level of functioning cannot be determined.       Effort Testing:   DESCRIPTOR       Dot Counting Test: --- --- Within Expectation  NAB EVI: --- --- Within Expectation  D-KEFS Color Word Effort Index: --- --- Within Expectation       Orientation:      Raw Score Percentile   NAB Orientation, Form 1 29/29 --- ---       Cognitive Screening:           Raw Score Percentile  SLUMS: 24/30 --- ---       Intellectual Functioning:           Standard Score Percentile   Test of Premorbid Functioning: 665 99 Above Average       Memory:          NAB Memory Module, Form 2:  Standard Score/ T Score Percentile   Total Memory Index 76 5 Well Below Average  List Learning       Total Trials 1-3 18/36 (42) 21 Below Average    List B 4/12 (50) 50 Average    Short Delay Free Recall 2/12 (31) 3 Well Below Average    Long Delay Free Recall 3/12 (6) 7 Well Below Average    Retention Percentage 150 (97) 59 Well Above Average    Recognition Discriminability 7 (49) 46 Average  Shape Learning       Total Trials 1-3 8/27 (31) 3 Well Below Average    Delayed Recall 4/9 (45) 31 Average    Retention Percentage 133 (61) 86 Above Average    Recognition Discriminability 7 (54) 66 Average  Story Learning       Immediate Recall 35/80 (38) 12 Below Average    Delayed Recall 12/40 (34) 5 Well Below Average    Retention Percentage 67 (40) 16 Below Average  Daily Living Memory       Immediate Recall 46/51 (60) 84 Above Average    Delayed Recall 6/17 (26) 1 Exceptionally Low    Retention Percentage 35 (19) <1 Exceptionally Low    Recognition Hits 6/10 (32) 4 Well Below Average       Attention/Executive Function:          Trail Making Test (TMT): Raw Score (T Score) Percentile     Part A 38 secs.,  0 errors (50) 50 Average    Part B 73 secs.,  0 errors (58) 79 Above Average         Scaled Score Percentile   WAIS-IV Coding: 13 84 Above Average       NAB Attention Module, Form 1: T Score Percentile     Digits Forward 72 99 Exceptionally High    Digits Backwards 67 96 Well Above Average       D-KEFS Color-Word Interference Test: Raw Score (Scaled Score) Percentile     Color Naming 22 secs. (15) 95 Well Above Average    Word Reading 18 secs. (14) 91 Above Average    Inhibition 62 secs. (12) 75 Above Average      Total Errors 1 error (12) 75 Above Average    Inhibition/Switching 54 secs. (13) 84 Above Average      Total Errors 4 errors (9) 37 Average       [place] Card Sorting Test: Raw Score Percentile     Categories (trials) 1 (64) 11-16 Below Average    Total  Errors 36 8 Well Below Average    Perseverative Errors 18 25 Average    Non-Perseverative Errors 18 7 Well Below Average    Failure to Maintain Set 1 --- ---       NAB Executive Functions Module, Form 1: T Score Percentile     Judgment 63 91 Well Above Average       Language:          Verbal Fluency Test: Raw Score (T Score) Percentile     Phonemic Fluency (FAS) 40 (49) 46 Average    Animal Fluency 15 (45) 31 Average  NAB Language Module, Form 1: T Score Percentile     Auditory Comprehension 58 79 Above Average    Naming 30/31 (60) 84 Above Average       Visuospatial/Visuoconstruction:      Raw Score Percentile   Clock Drawing: 10/10 --- Within Normal Limits       NAB Spatial Module, Form 1: T Score Percentile     Figure Drawing Copy 57 75 Above Average        Scaled Score Percentile   WAIS-IV Block Design: 10 50 Average       Mood and Personality:      Raw Score Percentile   Geriatric Depression Scale: 6 --- Within Normal Limits  Geriatric Anxiety Scale: 11 --- Minimal    Somatic 2 --- Minimal    Cognitive 3 --- Mild    Affective 6 --- Mild       Additional Questionnaires:      Raw Score Percentile   PROMIS Sleep Disturbance Questionnaire: 17 --- None to Slight   Informed Consent and Coding/Compliance:   Ms. Yanko was provided with a verbal description of the nature and purpose of the present neuropsychological evaluation. Also reviewed were the foreseeable risks and/or discomforts and benefits of the procedure, limits of confidentiality, and mandatory reporting requirements of this provider. The patient was given the opportunity to ask questions and receive answers about the evaluation. Oral consent to participate was provided by the patient.   This evaluation was conducted by Christia Reading, Ph.D., licensed clinical neuropsychologist. Ms. Flud completed a comprehensive clinical interview with Dr. Melvyn Novas, billed as one unit [phone], and 130 minutes of cognitive  testing and scoring, billed as one unit [phone] and three additional units [phone]. Psychometrist Milana Kidney, B.S., assisted Dr. Melvyn Novas with test administration and scoring procedures. As a separate and discrete service, Dr. Melvyn Novas spent a total of 130 minutes in interpretation and report writing billed as one unit [phone] and one unit [phone].

## 2020-11-21 NOTE — Progress Notes (Signed)
   Psychometrician Note   Cognitive testing was administered to Gail Lester by Milana Kidney, B.S. (psychometrist) under the supervision of Dr. Christia Reading, Ph.D., licensed psychologist on 11/21/20. Gail Lester did not appear overtly distressed by the testing session per behavioral observation or responses across self-report questionnaires. Dr. Christia Reading, Ph.D. checked in with Gail Lester as needed to manage any distress related to testing procedures (if applicable). Rest breaks were offered.    The battery of tests administered was selected by Dr. Christia Reading, Ph.D. with consideration to Gail Lester's current level of functioning, the nature of her symptoms, emotional and behavioral responses during interview, level of literacy, observed level of motivation/effort, and the nature of the referral question. This battery was communicated to the psychometrist. Communication between Dr. Christia Reading, Ph.D. and the psychometrist was ongoing throughout the evaluation and Dr. Christia Reading, Ph.D. was immediately accessible at all times. Dr. Christia Reading, Ph.D. provided supervision to the psychometrist on the date of this service to the extent necessary to assure the quality of all services provided.    Gail Lester will return within approximately 1-2 weeks for an interactive feedback session with Dr. Melvyn Novas at which time her test performances, clinical impressions, and treatment recommendations will be reviewed in detail. Gail Lester understands she can contact our office should she require our assistance before this time.  A total of 130 minutes of billable time were spent face-to-face with Gail Lester by the psychometrist. This includes both test administration and scoring time. Billing for these services is reflected in the clinical report generated by Dr. Christia Reading, Ph.D..  This note reflects time spent with the psychometrician and does not include test scores or any clinical  interpretations made by Dr. Melvyn Novas. The full report will follow in a separate note.

## 2020-11-24 ENCOUNTER — Ambulatory Visit: Payer: Medicare Other | Admitting: Physical Therapy

## 2020-11-28 ENCOUNTER — Ambulatory Visit (INDEPENDENT_AMBULATORY_CARE_PROVIDER_SITE_OTHER): Payer: Medicare Other | Admitting: Psychology

## 2020-11-28 ENCOUNTER — Other Ambulatory Visit: Payer: Self-pay

## 2020-11-28 DIAGNOSIS — G3184 Mild cognitive impairment, so stated: Secondary | ICD-10-CM | POA: Diagnosis not present

## 2020-11-28 NOTE — Progress Notes (Signed)
   Neuropsychology Feedback Session Gail Lester. [facility] of Neurology  Reason for Referral:   Gail Lester a 79 y.o. right-handed Caucasian female referred by Gail Lester, M.D.,to characterize hercurrent cognitive functioning and assist with diagnostic clarity and treatment planning in the context of subjective cognitive decline.   Feedback:   Gail Lester completed a comprehensive neuropsychological evaluation on 11/21/2020. Please refer to that encounter for the full report and recommendations. Briefly, results suggested an isolated impairment across encoding (i.e., learning) and retrieval aspects of both verbal and visual memory. Performances across yes/no recognition tasks were variable, but largely appropriate. She also exhibited an isolated weakness across a computerized task assessing hypothesis testing and adaptability; however, performance across all other executive functioning measures was strong. The etiology of memory deficits is unclear. There is no neuroimaging available for me to review to examine possible anatomical correlates. Despite difficulties learning information and retrieving previously learned information on demand, her retention percentages across several tasks were quite strong, as were performances across yes/no recognition trials. This does not suggest a memory storage deficit and is inconsistent with the typical pattern of Alzheimer's disease. She also performed very strongly across other non-memory tasks commonly implicated early on in this condition. Despite this, I certainly cannot rule out the very early stages of this condition and continued medical monitoring will be important moving forward.  Gail Lester was unaccompanied during the current feedback session. Content of the current session focused on the results of her neuropsychological evaluation. Gail Lester was given the opportunity to ask questions and her questions were answered.  She was encouraged to reach out should additional questions arise. A copy of her report is available to her on MyChart.      30 minutes were spent conducting the current feedback session with Gail Lester, billed as one unit [phone].

## 2020-11-28 NOTE — Patient Instructions (Signed)
A repeat neuropsychological evaluation in 18-24 months (or sooner if functional decline is noted) is recommended to assess the trajectory of future cognitive decline should it occur. This will also aid in future efforts towards improved diagnostic clarity.  There was no neuroimaging available for my review. I would recommend that Gail Lester be referred for a brain MRI to determine potential anatomical correlates for memory dysfunction. She could also discuss the necessity of a referral to a neurologist with her PCP. I would not strongly advocate for the initiation of memory-based medications at this time; however, I certainly defer to her PCP and/or future neurologist should they feel differently.   Gail Lester should also be referred for an audiologic evaluation. Uncorrected hearing loss has been linked to future cognitive decline across several empirical studies. As such, evaluating and addressing hearing loss will be important moving forward.   If not already done, Gail Lester and her family may want to discuss her wishes regarding durable power of attorney and medical decision making so that she can have input into these choices.  Gail Lester is encouraged to attend to lifestyle factors for brain health (e.g., regular physical exercise, good nutrition habits, regular participation in cognitively-stimulating activities, and general stress management techniques), which are likely to have benefits for both emotional adjustment and cognition. In fact, in addition to promoting good general health, regular exercise incorporating aerobic activities (e.g., brisk walking, jogging, cycling, etc.) has been demonstrated to be a very effective treatment for depression and stress, with similar efficacy rates to both antidepressant medication and psychotherapy. Optimal control of vascular risk factors (including safe cardiovascular exercise and adherence to dietary recommendations) is encouraged.   If interested, there  are some activities which have therapeutic value and can be useful in keeping her cognitively stimulated. For suggestions, Gail Lester is encouraged to go to the following website: https://www.[facility].org/get-to-know-barrow/centers-programs/neurorehabilitation-center/neuro-rehab-apps-and-games/ which has options, categorized by level of difficulty. It should be noted that these activities should not be viewed as a substitute for therapy.  When learning new information, she would benefit from information being broken up into small, manageable pieces. She may also find it helpful to articulate the material in her own words and in a context to promote encoding at the onset of a new task. This material may need to be repeated multiple times to promote encoding.  Memory can be improved using internal strategies such as rehearsal, repetition, chunking, mnemonics, association, and imagery. External strategies such as written notes in a consistently used memory journal, visual and nonverbal auditory cues such as a calendar on the refrigerator or appointments with alarm, such as on a cell phone, can also help maximize recall.

## 2021-02-10 ENCOUNTER — Telehealth: Payer: Self-pay | Admitting: Pharmacist

## 2021-02-10 NOTE — Progress Notes (Addendum)
Chronic Care Management Pharmacy Assistant   Name: BETRICE WANAT  MRN: [id number] DOB: [birth year]  Reason for Encounter: Disease State For DM.  Patient Questions:  1.  Have you seen any other providers since your last visit? Yes.   2.  Any changes in your medicines or health? No.  PCP : Mosie Lukes, MD   Their chronic conditions include: Hyperlipidemia, Diabetes, Osteopenia.  Office Visit: None since 11/08/20  Consults: 11/28/20 Neurology Hazle Coca, PHD. [facility]. No medication changes.  11/21/20 Neurology Hazle Coca, PHD. [facility]. No medication changes.  Allergies:   Allergies  Allergen Reactions   Ramipril Diarrhea   Lipitor [Atorvastatin]     Diarrhea, muscle cramps   Lisinopril     myalgias    Medications: Outpatient Encounter Medications as of 02/10/2021  Medication Sig Note   ACCU-CHEK AVIVA PLUS test strip CHECK BLOOD SUGAR 4 TIMES  DAILY AND AS NEEDED FOR  LABILE BLOOD SUGARS    aspirin 81 MG tablet Take 81 mg by mouth daily.    Blood Glucose Monitoring Suppl (ACCU-CHEK AVIVA PLUS) W/DEVICE KIT Use as directed    CALCIUM PO Take 630 mg by mouth daily.     Cholecalciferol (VITAMIN D3) 50 MCG (2000 UT) TABS Take 1 tablet by mouth daily.    Coenzyme Q10 (COQ10) 100 MG CAPS Take 1 capsule by mouth daily.    glimepiride (AMARYL) 4 MG tablet TAKE 1 TABLET BY MOUTH  TWICE DAILY    Krill Oil 500 MG CAPS Take 1 capsule by mouth daily.    losartan (COZAAR) 25 MG tablet TAKE 1 TABLET BY MOUTH  DAILY    LUTEIN PO Take 25 mg by mouth daily. 11/08/2020: For eye health, recommended by ophthalmologist   metFORMIN (GLUCOPHAGE-XR) 500 MG 24 hr tablet Take 1 tablet (500 mg total) by mouth daily. 11/08/2020: Increasing to twice daily 11/08/20   Multiple Vitamin (MULTIVITAMIN) tablet Take 1 tablet by mouth daily.     rosuvastatin (CRESTOR) 20 MG tablet TAKE ONE-HALF TABLET BY  MOUTH DAILY    tiZANidine (ZANAFLEX) 2 MG tablet Take 0.5-2 tablets  (1-4 mg total) by mouth every 6 (six) hours as needed for muscle spasms.    No facility-administered encounter medications on file as of 02/10/2021.    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   GERD (gastroesophageal reflux disease) 10/09/2020   Muscle cramps 04/24/2018   Osteopenia 05/23/2017   Onychomycosis 02/24/2017   Constipation 02/15/2017   Epidermal cyst 01/21/2017   Disorder of capillaries 01/16/2016   Rosacea 11/08/2015   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 08/14/2015   Rotator cuff arthropathy 11/24/2014   Dysplastic nevus 10/11/2014   Seborrheic keratosis 09/28/2014   Melanocytic nevi, unspecified 09/28/2014   Hemangioma 09/28/2014   Overweight 06/14/2014   Diarrhea 02/10/2013   Type 2 diabetes mellitus ([place]) 02/10/2013   Hyperlipidemia, mixed 02/10/2013   Fibrocystic breast 02/10/2013   Dupuytren's contracture of both hands 02/10/2013    Goals Addressed   None    Recent Relevant Labs: Lab Results  Component Value Date/Time   HGBA1C 7.6 (H) 10/06/2020 02:23 PM   HGBA1C 7.8 (H) 04/05/2020 03:09 PM   MICROALBUR 1.6 11/03/2015 08:32 AM   MICROALBUR 1.4 08/01/2015 11:37 AM    Kidney Function Lab Results  Component Value Date/Time   CREATININE 0.62 10/06/2020 02:23 PM   CREATININE 0.69 04/05/2020 03:09 PM   CREATININE 0.63  10/05/2019 02:44 PM   CREATININE 0.69 04/25/2018 03:44 PM   GFR 82.12 04/05/2020 03:09 PM   GFRNONAA >90 12/08/2011 07:24 PM   GFRAA >90 12/08/2011 07:24 PM    Current antihyperglycemic regimen:  Metformin 500 mg 1 tablet daily  Glimepiride 4 mg 1 tablet twice daily   What recent interventions/DTPs have been made to improve glycemic control:  None. Have there been any recent hospitalizations or ED visits since last visit with CPP? Patient stated no.  Patient reports hypoglycemic symptoms, including None   Patient reports hyperglycemic symptoms,  including none   How often are you checking your blood sugar? once daily   What are your blood sugars ranging? Patient stated she checks it in the morning. 0 01/24/21 73 01/25/21 39 01/26/21 62 01/30/21 70 01/31/21 48 02/02/21 56 02/06/21 67 02/07/21 81 02/08/21 66 02/09/21 58 02/10/21 60  During the week, how often does your blood glucose drop below 70? Patient stated about once or Twice a week.  Are you checking your feet daily/regularly?  Patient stated she checks her feet daily. She stated both feet are fine.   Adherence Review: Is the patient currently on a STATIN medication? Yes, Rosuvastatin 20 mg.  Is the patient currently on ACE/ARB medication? Yes, Losartan 25 mg.  Does the patient have >5 day gap between last estimated fill dates? No  Patient stated she does not have any concerns or questions about her medications at this time.    Follow-Up:  Pharmacist Review   Charlann Lange, RMA Clinical Pharmacist Assistant [phone]  11 minutes spent in review, coordination, and documentation.  Reviewed by: Beverly Milch, PharmD Clinical Pharmacist [facility] [phone]

## 2021-03-07 ENCOUNTER — Other Ambulatory Visit: Payer: Self-pay | Admitting: Family Medicine

## 2021-04-05 NOTE — Progress Notes (Signed)
Subjective:   Gail Lester is a 80 y.o. female who presents for Medicare Annual (Subsequent) preventive examination.  Review of Systems     Cardiac Risk Factors include: advanced age (>75mn, >>80women);diabetes mellitus;dyslipidemia     Objective:    Today's Vitals   04/06/21 0938  BP: 122/60  Pulse: 72  Resp: 16  Temp: (!) 97.2 F (36.2 C)  TempSrc: Temporal  SpO2: 96%  Weight: 153 lb 6.4 oz (69.6 kg)  Height: _0  (1.626 m)   Body mass index is 26.33 kg/m.  Advanced Directives 04/06/2021 04/05/2020 11/24/2018 11/21/2017 10/24/2015 11/08/2014 12/09/2011  Does Patient Have a Medical Advance Directive? _1  No Patient does not have advance directive;Patient would not like information  Type of AScientist, forensicPower of AGeorgetownLiving will HPikes CreekLiving will HChristieLiving will [place]roquoisLiving will HPoplarLiving will - -  Does patient want to make changes to medical advance directive? - No - Patient declined No - Patient declined - No - Patient declined - -  Copy of HHeneferin Chart? Yes - validated most recent copy scanned in chart (See row information) No - copy requested Yes - validated most recent copy scanned in chart (See row information) Yes No - copy requested - -  Would patient like information on creating a medical advance directive? - - - - - Yes - Educational materials given -  Pre-existing out of facility DNR order (yellow form or pink MOST form) - - - - - - No    Current Medications (verified) Outpatient Encounter Medications as of 04/06/2021  Medication Sig  . ACCU-CHEK AVIVA PLUS test strip CHECK BLOOD SUGAR 4 TIMES  DAILY AND AS NEEDED FOR  LABILE BLOOD SUGARS  . aspirin 81 MG tablet Take 81 mg by mouth daily.  . Blood Glucose Monitoring Suppl (ACCU-CHEK AVIVA PLUS) W/DEVICE KIT Use as directed  . CALCIUM PO Take 630 mg by mouth  daily.   . Cholecalciferol (VITAMIN D3) 50 MCG (2000 UT) TABS Take 1 tablet by mouth daily.  . Coenzyme Q10 (COQ10) 100 MG CAPS Take 1 capsule by mouth daily.  .Marland Kitchenglimepiride (AMARYL) 4 MG tablet TAKE 1 TABLET BY MOUTH  TWICE DAILY  . Krill Oil 500 MG CAPS Take 1 capsule by mouth daily.  .Marland Kitchenlosartan (COZAAR) 25 MG tablet TAKE 1 TABLET BY MOUTH  DAILY  . LUTEIN PO Take 25 mg by mouth daily.  . metFORMIN (GLUCOPHAGE-XR) 500 MG 24 hr tablet TAKE 1 TABLET BY MOUTH  DAILY  . Multiple Vitamin (MULTIVITAMIN) tablet Take 1 tablet by mouth daily.   . rosuvastatin (CRESTOR) 20 MG tablet TAKE ONE-HALF TABLET BY  MOUTH DAILY  . tiZANidine (ZANAFLEX) 2 MG tablet Take 0.5-2 tablets (1-4 mg total) by mouth every 6 (six) hours as needed for muscle spasms.   No facility-administered encounter medications on file as of 04/06/2021.    Allergies (verified) Ramipril, Lipitor [atorvastatin], and Lisinopril   History: Past Medical History:  Diagnosis Date  . Arthritis   . Constipation   . Diarrhea   . Dupuytren's contracture of both hands 02/10/2013  . Epidermal cyst 01/21/2017  . Fibrocystic breast 02/10/2013   Left h/o FN biopsy   . Ganglion cyst 11/14/2014   Right 2nd finger  . GERD (gastroesophageal reflux disease) 10/09/2020  . Hemangioma 09/28/2014  . History of cataracts    Previous surgery to remove bilaterally  .  History of chicken pox    as a child  . History of colonic polyp 11/21/2017  . History of measles    as a child  . History of mumps    as a child  . Hypercholesteremia   . Hyperlipidemia, mixed 02/10/2013  . Melanocytic nevi, unspecified 09/28/2014  . Mild neurocognitive disorder, unclear etiology 11/21/2020  . 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 Onychomycosis 02/24/2017  . Osteopenia 05/23/2017  . Seborrheic keratosis 09/28/2014  . Type 2 diabetes mellitus 02/10/2013   Past Surgical History:  Procedure Laterality Date  . APPENDECTOMY  1955  . BREAST BIOPSY  1985-1990    benign  . EYE SURGERY Right 04/2019   cataract removal  . EYE SURGERY Left 03/2020   cataract removal  . ORIF WRIST FRACTURE  12/09/2011   Procedure: OPEN REDUCTION INTERNAL FIXATION (ORIF) WRIST FRACTURE;  Surgeon: Linna Hoff;  Location: [place];  Service: Orthopedics;  Laterality: Left;   Family History  Problem Relation Age of Onset  . Kidney disease Mother   . Alcoholism Mother   . Diabetes Father   . Kidney disease Maternal Grandmother   . Cancer Maternal Grandmother        bladder  . Cancer Maternal Aunt   . Alzheimer's disease Maternal Aunt   . Scoliosis Son   . Colon cancer Other        grandparent   Social History   Socioeconomic History  . Marital status: Divorced    Spouse name: Not on file  . Number of children: Not on file  . Years of education: 64  . Highest education level: Some college, no degree  Occupational History  . Not on file  Tobacco Use  . Smoking status: Former Smoker    Quit date: 11/17/1989    Years since quitting: 31.4  . Smokeless tobacco: Never Used  Substance and Sexual Activity  . Alcohol use: No    Comment: rarely while in social settings  . Drug use: No  . Sexual activity: Not Currently  Other Topics Concern  . Not on file  Social History Narrative  . Not on file   Social Determinants of Health   Financial Resource Strain: Low Risk   . Difficulty of Paying Living Expenses: Not hard at all  Food Insecurity: No Food Insecurity  . Worried About Charity fundraiser in the Last Year: Never true  . Ran Out of Food in the Last Year: Never true  Transportation Needs: No Transportation Needs  . Lack of Transportation (Medical): No  . Lack of Transportation (Non-Medical): No  Physical Activity: Sufficiently Active  . Days of Exercise per Week: 5 days  . Minutes of Exercise per Session: 30 min  Stress: No Stress Concern Present  . Feeling of Stress : Not at all  Social Connections: Socially Isolated  . Frequency of Communication  with Friends and Family: More than three times a week  . Frequency of Social Gatherings with Friends and Family: Once a week  . Attends Religious Services: Never  . Active Member of Clubs or Organizations: No  . Attends Archivist Meetings: Never  . Marital Status: Divorced    Tobacco Counseling Counseling given: Not Answered   Clinical Intake:  Pre-visit preparation completed: Yes  Pain : No/denies pain     Nutritional Status: BMI 25 -29 Overweight Nutritional Risks: None Diabetes: Yes CBG done?: No Did pt. bring in CBG monitor from home?: No  How  often do you need to have someone help you when you read instructions, pamphlets, or other written materials from your doctor or pharmacy?: 1 - Never  Diabetes:  Is the patient diabetic?  Yes  If diabetic, was a CBG obtained today?  No  Did the patient bring in their glucometer from home?  No  How often do you monitor your CBG's? Daily.   Financial Strains and Diabetes Management:  Are you having any financial strains with the device, your supplies or your medication? No .  Does the patient want to be seen by Chronic Care Management for management of their diabetes?  No  Would the patient like to be referred to a Nutritionist or for Diabetic Management?  No   Diabetic Exams:  Diabetic Eye Exam: Completed 04/25/2020.   Diabetic Foot Exam:  Pt has been advised about the importance in completing this exam. To be completed by PCP    Interpreter Needed?: No  Information entered by :: Caroleen Hamman LPN   Activities of Daily Living In your present state of health, do you have any difficulty performing the following activities: 04/06/2021  Hearing? N  Vision? N  Difficulty concentrating or making decisions? Y  Comment occasionally forgets words  Walking or climbing stairs? N  Dressing or bathing? N  Doing errands, shopping? N  Preparing Food and eating ? N  Using the Toilet? N  In the past six months, have  you accidently leaked urine? N  Do you have problems with loss of bowel control? N  Managing your Medications? N  Managing your Finances? N  Housekeeping or managing your Housekeeping? N  Some recent data might be hidden    Patient Care Team: Mosie Lukes, MD as PCP - General (Family Medicine) Sharyne Peach, MD as Consulting Physician (Ophthalmology) Martinique, Amy, MD as Consulting Physician (Dermatology) Day, Melvenia Beam, [facility] (Inactive) as Pharmacist (Pharmacist)  Indicate any recent Medical Services you may have received from other than [facility] providers in the past year (date may be approximate).     Assessment:   This is a routine wellness examination for Lilyahna.  Hearing/Vision screen  Hearing Screening   _0  _1  _2  _3  _4  _5  _6  _7  _8   Right ear:           Left ear:           Comments: C/o mild hearing loss & soreness in her right ear  Vision Screening Comments: Reading glasses Last eye exam-04/2020-  Dietary issues and exercise activities discussed: Current Exercise Habits: Home exercise routine, Type of exercise: walking, Time (Minutes): 30, Frequency (Times/Week): 7, Weekly Exercise (Minutes/Week): 210, Intensity: Mild, Exercise limited by: None identified  Goals      Patient Stated   .  Increase physical activity (pt-stated)      Other   .  Chronic Care Management Pharmacy Care Plan      CARE PLAN ENTRY (see longitudinal plan of care for additional care plan information)  Current Barriers:  . Chronic Disease Management support, education, and care coordination needs related to Hyperlipidemia, Diabetes, Osteopenia   Hypertension Screening BP Readings from Last 3 Encounters:  11/08/20 (!) 122/58  10/06/20 119/69  04/05/20 116/60   . Pharmacist Clinical Goal(s): o Over the next 180 days, patient will work with PharmD and providers to maintain BP goal <140/90 . Current regimen:  o Losartan 7m daily . Interventions: o Discussed BP  goal . Patient self care activities - Over the next 180 days, patient  will: o Maintain hypertension medication regimen.  o Check blood pressure if you start to feel "swimmy headed"  Hyperlipidemia Lab Results  Component Value Date/Time   LDLCALC 71 10/06/2020 02:23 PM   LDLDIRECT 95.0 10/05/2019 02:44 PM   . Pharmacist Clinical Goal(s): o Over the next 180 days, patient will work with PharmD and providers to maintain LDL goal < 100 . Current regimen:  . Rosuvastatin 97m 1/2 tab (175m daily . Coenzyme Q10 10076maily . Krill Oil 500m40mily . Interventions: o Discussed LDL goal o Collaboration with provider regarding medication management (rosuvastatin 10mg27mly to replace #1/2 tab of 20mg 49mvastatin) . Patient self care activities - Over the next 180 days, patient will: o Maintain cholesterol medication regimen.  o Receive updated prescription for rosuvastatin 10mg t15mplace 20mg ta52m  Diabetes Lab Results  Component Value Date/Time   HGBA1C 7.6 (H) 10/06/2020 02:23 PM   HGBA1C 7.8 (H) 04/05/2020 03:09 PM   . Pharmacist Clinical Goal(s): o Over the next 180 days, patient will work with PharmD and providers to achieve A1c goal  7.5% . Current regimen:  . GlimepMarland Kitchenride 4mg twic59maily . Metformin XR 500mg dail19mInterventions: o Discussed DM goal o Consider increasing metformin to 500mg twice20mly o Coordinated with mail order pharmacy to hold metformin delivery noting pt's surplus of metformin . Patient self care activities - Over the next 180 days, patient will: o Check blood sugar once daily, document, and provide at future appointments o Contact provider with any episodes of hypoglycemia o Increase metformin to 500mg daily 69mntact mail order pharmacy when metformin supply starts to run low  Osteopenia . Pharmacist Clinical Goal(s) o Over the next 180 days, patient will work with PharmD and providers to reduce risk of fracture due to osteopenia . Current  regimen:  . Calcium 630mg daily .76mamin D 2000 units daily . Interventions: o Discussed intake of 1200mg of calci71maily through diet and/or supplementation o Discussed intake of [phone] units of vitamin D through supplementation  o Consider getting combined calcium/vitamin D supplement o Consider repeat DEXA Scan . Patient self care activities - Over the next 180 days, patient will: o Consider purchasing combined calcium and vitamin D supplement o Consider repeat DEXA Scan  Medication management . Pharmacist Clinical Goal(s): o Over the next 180 days, patient will work with PharmD and providers to achieve optimal medication adherence . Current pharmacy: [facility] o Comprehensive medication review performed. o Continue current medication management strategy . Patient self care activities - Over the next 180 days, patient will: o Focus on medication adherence by filling and taking medications appropriately  o Take medications as prescribed o Report any questions or concerns to PharmD and/or provider(s)  Initial goal documentation     .  Increase water intake      Depression Screen PHQ 2/9 Scores 04/06/2021 04/05/2020 10/05/2019 11/24/2018 11/21/2017 11/09/2016 03/28/2016  PHQ - 2 Score 0 0 0 0 0 0 0    Fall Risk Fall Risk  04/06/2021 04/05/2020 10/05/2019 11/24/2018 11/21/2017  Falls in the past year? _0 No  Number falls in past yr: 0 0 0 0 -  Injury with Fall? 0 1 1 0 -  Risk for fall due to : - - Medication side effect - -  Follow up Falls prevention discussed Education provided;Falls prevention discussed Falls evaluation completed;Education provided;Falls prevention discussed - -    FALL RISK PREVENTION PERTAINING TO  THE HOME:  Any stairs in or around the home? No  Home free of loose throw rugs in walkways, pet beds, electrical cords, etc? Yes  Adequate lighting in your home to reduce risk of falls? Yes   ASSISTIVE DEVICES UTILIZED TO  PREVENT FALLS:  Life alert? No  Use of a cane, walker or w/c? No  Grab bars in the bathroom? Yes  Shower chair or bench in shower? No  Elevated toilet seat or a handicapped toilet? No   TIMED UP AND GO:  Was the test performed? Yes .  Length of time to ambulate 10 feet: 10 sec.   Gait steady and fast without use of assistive device  Cognitive Function:Normal cognitive status assessed by direct observation by this Nurse Health Advisor. No abnormalities found.  ' MMSE - Mini Mental State Exam 11/21/2017 11/09/2016  Orientation to time 5 5  Orientation to Place 5 5  Registration 3 3  Attention/ Calculation 5 5  Recall 3 3  Language- name 2 objects 2 2  Language- repeat 1 1  Language- follow 3 step command 3 3  Language- read & follow direction 1 1  Write a sentence 1 1  Copy design 1 1  Total score 30 30     6CIT Screen 04/06/2021  What Year? 0 points  What month? 0 points  What time? 0 points  Count back from 20 0 points  Months in reverse 0 points  Repeat phrase 0 points  Total Score 0    Immunizations Immunization History  Administered Date(s) Administered  . Fluad Quad(high Dose 65+) 08/28/2019, 10/04/2020  . Influenza, High Dose Seasonal PF 09/11/2016, 10/03/2017, 08/26/2018  . Influenza,inj,Quad PF,6+ Mos 09/02/2013, 08/31/2014, 09/21/2015  . PFIZER Comirnaty(Gray Top)Covid-19 Tri-Sucrose Vaccine 04/06/2021  . PFIZER(Purple Top)SARS-COV-2 Vaccination 01/28/2020, 02/22/2020, 09/18/2020  . Pneumococcal Conjugate-13 02/04/2014  . Pneumococcal Polysaccharide-23 01/12/2007  . Tdap 01/13/2008, 10/06/2019  . Zoster 01/12/2007  . Zoster Recombinat (Shingrix) 08/05/2018, 10/01/2018    TDAP status: Up to date  Flu Vaccine status: Up to date  Pneumococcal vaccine status: Up to date  Covid-19 vaccine status: Completed vaccines  Qualifies for Shingles Vaccine? No   Zostavax completed Yes   Shingrix Completed?: Yes  Screening Tests Health Maintenance  Topic  Date Due  . Hepatitis C Screening  Never done  . FOOT EXAM  01/29/2017  . HEMOGLOBIN A1C  04/06/2021  . OPHTHALMOLOGY EXAM  04/25/2021  . INFLUENZA VACCINE  07/24/2021  . TETANUS/TDAP  10/05/2029  . DEXA SCAN  Completed  . COVID-19 Vaccine  Completed  . PNA vac Low Risk Adult  Completed  . HPV VACCINES  Aged Out    Health Maintenance  Health Maintenance Due  Topic Date Due  . Hepatitis C Screening  Never done  . FOOT EXAM  01/29/2017  . HEMOGLOBIN A1C  04/06/2021    Colorectal cancer screening: No longer required.   Mammogram status: Due-Patient states she will think about it & discuss with Dr. Charlett Blake at her visit next Monday.  Bone Density status: Due-Patient states she will think about it & discuss with Dr. Charlett Blake at her visit next Monday.  Lung Cancer Screening: (Low Dose CT Chest recommended if Age 58-80 years, 30 pack-year currently smoking OR have quit w/in 15years.) does not qualify.    Additional Screening:  Hepatitis C Screening: does not qualify  Vision Screening: Recommended annual ophthalmology exams for early detection of glaucoma and other disorders of the eye. Is the patient up to date  with their annual eye exam?  Yes  Who is the provider or what is the name of the office in which the patient attends annual eye exams? Pt unsure of name   Dental Screening: Recommended annual dental exams for proper oral hygiene  Community Resource Referral / Chronic Care Management: CRR required this visit?  No   CCM required this visit?  No      Plan:     I have personally reviewed and noted the following in the patient's chart:   . Medical and social history . Use of alcohol, tobacco or illicit drugs  . Current medications and supplements . Functional ability and status . Nutritional status . Physical activity . Advanced directives . List of other physicians . Hospitalizations, surgeries, and ER visits in previous 12 months . Vitals . Screenings to include  cognitive, depression, and falls . Referrals and appointments  In addition, I have reviewed and discussed with patient certain preventive protocols, quality metrics, and best practice recommendations. A written personalized care plan for preventive services as well as general preventive health recommendations were provided to patient.   Patient to access avs on mychart  Marta Antu, [place]   07/11/5973  Nurse Health Advisor  Nurse Notes: None

## 2021-04-06 ENCOUNTER — Ambulatory Visit (INDEPENDENT_AMBULATORY_CARE_PROVIDER_SITE_OTHER): Payer: Medicare Other

## 2021-04-06 ENCOUNTER — Ambulatory Visit: Payer: Medicare Other | Attending: Internal Medicine

## 2021-04-06 ENCOUNTER — Ambulatory Visit: Payer: Medicare Other | Admitting: *Deleted

## 2021-04-06 ENCOUNTER — Other Ambulatory Visit: Payer: Self-pay

## 2021-04-06 VITALS — BP 122/60 | HR 72 | Temp 97.2°F | Resp 16 | Ht 64.0 in | Wt 153.4 lb

## 2021-04-06 DIAGNOSIS — Z0000 Encounter for general adult medical examination without abnormal findings: Secondary | ICD-10-CM | POA: Diagnosis not present

## 2021-04-06 DIAGNOSIS — Z23 Encounter for immunization: Secondary | ICD-10-CM

## 2021-04-06 NOTE — Patient Instructions (Signed)
Gail Lester , Thank you for taking time to come for your Medicare Wellness Visit. I appreciate your ongoing commitment to your health goals. Please review the following plan we discussed and let me know if I can assist you in the future.   Screening recommendations/referrals: Colonoscopy: No longer required Mammogram: Due-Per our conversation today, you will think about it & discuss with Dr. Charlett Blake next Monday at your visit with her. Bone Density:Due-Per our conversation today, you will think about it & discuss with Dr. Charlett Blake next Monday at your visit with her.  Recommended yearly ophthalmology/optometry visit for glaucoma screening and checkup Recommended yearly dental visit for hygiene and checkup  Vaccinations: Influenza vaccine: Up to date Pneumococcal vaccine: Completed vaccines Tdap vaccine: Up to date-Due 10/05/2029 Shingles vaccine: Completed vaccines   Covid-19:Completed vaccines  Advanced directives: Copy in chart  Conditions/risks identified: See problem list  Next appointment: Follow up in one year for your annual wellness visit 04/12/2022 @ 9:40   Preventive Care 80 Years and Older, Female Preventive care refers to lifestyle choices and visits with your health care provider that can promote health and wellness. What does preventive care include?  A yearly physical exam. This is also called an annual well check.  Dental exams once or twice a year.  Routine eye exams. Ask your health care provider how often you should have your eyes checked.  Personal lifestyle choices, including:  Daily care of your teeth and gums.  Regular physical activity.  Eating a healthy diet.  Avoiding tobacco and drug use.  Limiting alcohol use.  Practicing safe sex.  Taking low-dose aspirin every day.  Taking vitamin and mineral supplements as recommended by your health care provider. What happens during an annual well check? The services and screenings done by your health care  provider during your annual well check will depend on your age, overall health, lifestyle risk factors, and family history of disease. Counseling  Your health care provider may ask you questions about your:  Alcohol use.  Tobacco use.  Drug use.  Emotional well-being.  Home and relationship well-being.  Sexual activity.  Eating habits.  History of falls.  Memory and ability to understand (cognition).  Work and work Statistician.  Reproductive health. Screening  You may have the following tests or measurements:  Height, weight, and BMI.  Blood pressure.  Lipid and cholesterol levels. These may be checked every 5 years, or more frequently if you are over 12 years old.  Skin check.  Lung cancer screening. You may have this screening every year starting at age 80 if you have a 30-pack-year history of smoking and currently smoke or have quit within the past 15 years.  Fecal occult blood test (FOBT) of the stool. You may have this test every year starting at age 80.  Flexible sigmoidoscopy or colonoscopy. You may have a sigmoidoscopy every 5 years or a colonoscopy every 10 years starting at age 80.  Hepatitis C blood test.  Hepatitis B blood test.  Sexually transmitted disease (STD) testing.  Diabetes screening. This is done by checking your blood sugar (glucose) after you have not eaten for a while (fasting). You may have this done every 1-3 years.  Bone density scan. This is done to screen for osteoporosis. You may have this done starting at age 80.  Mammogram. This may be done every 1-2 years. Talk to your health care provider about how often you should have regular mammograms. Talk with your health care provider about your  test results, treatment options, and if necessary, the need for more tests. Vaccines  Your health care provider may recommend certain vaccines, such as:  Influenza vaccine. This is recommended every year.  Tetanus, diphtheria, and acellular  pertussis (Tdap, Td) vaccine. You may need a Td booster every 10 years.  Zoster vaccine. You may need this after age 27.  Pneumococcal 13-valent conjugate (PCV13) vaccine. One dose is recommended after age 37.  Pneumococcal polysaccharide (PPSV23) vaccine. One dose is recommended after age 23. Talk to your health care provider about which screenings and vaccines you need and how often you need them. This information is not intended to replace advice given to you by your health care provider. Make sure you discuss any questions you have with your health care provider. Document Released: 01/06/2016 Document Revised: 08/29/2016 Document Reviewed: 10/11/2015 Elsevier Interactive Patient Education  2017 [place] Prevention in the Home Falls can cause injuries. They can happen to people of all ages. There are many things you can do to make your home safe and to help prevent falls. What can I do on the outside of my home?  Regularly fix the edges of walkways and driveways and fix any cracks.  Remove anything that might make you trip as you walk through a door, such as a raised step or threshold.  Trim any bushes or trees on the path to your home.  Use bright outdoor lighting.  Clear any walking paths of anything that might make someone trip, such as rocks or tools.  Regularly check to see if handrails are loose or broken. Make sure that both sides of any steps have handrails.  Any raised decks and porches should have guardrails on the edges.  Have any leaves, snow, or ice cleared regularly.  Use sand or salt on walking paths during winter.  Clean up any spills in your garage right away. This includes oil or grease spills. What can I do in the bathroom?  Use night lights.  Install grab bars by the toilet and in the tub and shower. Do not use towel bars as grab bars.  Use non-skid mats or decals in the tub or shower.  If you need to sit down in the shower, use a plastic,  non-slip stool.  Keep the floor dry. Clean up any water that spills on the floor as soon as it happens.  Remove soap buildup in the tub or shower regularly.  Attach bath mats securely with double-sided non-slip rug tape.  Do not have throw rugs and other things on the floor that can make you trip. What can I do in the bedroom?  Use night lights.  Make sure that you have a light by your bed that is easy to reach.  Do not use any sheets or blankets that are too big for your bed. They should not hang down onto the floor.  Have a firm chair that has side arms. You can use this for support while you get dressed.  Do not have throw rugs and other things on the floor that can make you trip. What can I do in the kitchen?  Clean up any spills right away.  Avoid walking on wet floors.  Keep items that you use a lot in easy-to-reach places.  If you need to reach something above you, use a strong step stool that has a grab bar.  Keep electrical cords out of the way.  Do not use floor polish or wax that  makes floors slippery. If you must use wax, use non-skid floor wax.  Do not have throw rugs and other things on the floor that can make you trip. What can I do with my stairs?  Do not leave any items on the stairs.  Make sure that there are handrails on both sides of the stairs and use them. Fix handrails that are broken or loose. Make sure that handrails are as long as the stairways.  Check any carpeting to make sure that it is firmly attached to the stairs. Fix any carpet that is loose or worn.  Avoid having throw rugs at the top or bottom of the stairs. If you do have throw rugs, attach them to the floor with carpet tape.  Make sure that you have a light switch at the top of the stairs and the bottom of the stairs. If you do not have them, ask someone to add them for you. What else can I do to help prevent falls?  Wear shoes that:  Do not have high heels.  Have rubber  bottoms.  Are comfortable and fit you well.  Are closed at the toe. Do not wear sandals.  If you use a stepladder:  Make sure that it is fully opened. Do not climb a closed stepladder.  Make sure that both sides of the stepladder are locked into place.  Ask someone to hold it for you, if possible.  Clearly mark and make sure that you can see:  Any grab bars or handrails.  First and last steps.  Where the edge of each step is.  Use tools that help you move around (mobility aids) if they are needed. These include:  Canes.  Walkers.  Scooters.  Crutches.  Turn on the lights when you go into a dark area. Replace any light bulbs as soon as they burn out.  Set up your furniture so you have a clear path. Avoid moving your furniture around.  If any of your floors are uneven, fix them.  If there are any pets around you, be aware of where they are.  Review your medicines with your doctor. Some medicines can make you feel dizzy. This can increase your chance of falling. Ask your doctor what other things that you can do to help prevent falls. This information is not intended to replace advice given to you by your health care provider. Make sure you discuss any questions you have with your health care provider. Document Released: 10/06/2009 Document Revised: 05/17/2016 Document Reviewed: 01/14/2015 Elsevier Interactive Patient Education  2017 [employer].

## 2021-04-06 NOTE — Progress Notes (Signed)
   Covid-19 Vaccination [facility]  Name:  Gail Lester    MRN: [id number] DOB: [birth year]  04/06/2021  Gail Lester was observed post Covid-19 immunization for 15 minutes without incident. She was provided with Vaccine Information Sheet and instruction to access the V-Safe system.   Gail Lester was instructed to call 911 with any severe reactions post vaccine: Marland Kitchen Difficulty breathing  . Swelling of face and throat  . A fast heartbeat  . A bad rash all over body  . Dizziness and weakness   Immunizations Administered    Name Date Dose VIS Date Route   PFIZER Comrnaty(Gray TOP) Covid-19 Vaccine 04/06/2021 10:39 AM 0.3 mL 12/01/2020 Intramuscular   Manufacturer: [employer], [employer]   Lot: [id number]   NDC: [phone]

## 2021-04-10 ENCOUNTER — Encounter: Payer: Self-pay | Admitting: Family Medicine

## 2021-04-10 ENCOUNTER — Ambulatory Visit (INDEPENDENT_AMBULATORY_CARE_PROVIDER_SITE_OTHER): Payer: Medicare Other | Admitting: Family Medicine

## 2021-04-10 ENCOUNTER — Other Ambulatory Visit: Payer: Self-pay

## 2021-04-10 ENCOUNTER — Other Ambulatory Visit (HOSPITAL_BASED_OUTPATIENT_CLINIC_OR_DEPARTMENT_OTHER): Payer: Self-pay

## 2021-04-10 VITALS — BP 122/60 | HR 80 | Temp 98.2°F | Resp 12 | Ht 64.0 in | Wt 153.2 lb

## 2021-04-10 DIAGNOSIS — R197 Diarrhea, unspecified: Secondary | ICD-10-CM | POA: Diagnosis not present

## 2021-04-10 DIAGNOSIS — M8580 Other specified disorders of bone density and structure, unspecified site: Secondary | ICD-10-CM | POA: Diagnosis not present

## 2021-04-10 DIAGNOSIS — M542 Cervicalgia: Secondary | ICD-10-CM

## 2021-04-10 DIAGNOSIS — R252 Cramp and spasm: Secondary | ICD-10-CM

## 2021-04-10 DIAGNOSIS — E119 Type 2 diabetes mellitus without complications: Secondary | ICD-10-CM

## 2021-04-10 DIAGNOSIS — K219 Gastro-esophageal reflux disease without esophagitis: Secondary | ICD-10-CM | POA: Diagnosis not present

## 2021-04-10 DIAGNOSIS — Z7289 Other problems related to lifestyle: Secondary | ICD-10-CM | POA: Diagnosis not present

## 2021-04-10 DIAGNOSIS — E782 Mixed hyperlipidemia: Secondary | ICD-10-CM | POA: Diagnosis not present

## 2021-04-10 DIAGNOSIS — E669 Obesity, unspecified: Secondary | ICD-10-CM

## 2021-04-10 DIAGNOSIS — G3184 Mild cognitive impairment, so stated: Secondary | ICD-10-CM | POA: Diagnosis not present

## 2021-04-10 DIAGNOSIS — H6091 Unspecified otitis externa, right ear: Secondary | ICD-10-CM

## 2021-04-10 DIAGNOSIS — E1169 Type 2 diabetes mellitus with other specified complication: Secondary | ICD-10-CM | POA: Diagnosis not present

## 2021-04-10 DIAGNOSIS — H6090 Unspecified otitis externa, unspecified ear: Secondary | ICD-10-CM | POA: Insufficient documentation

## 2021-04-10 DIAGNOSIS — M533 Sacrococcygeal disorders, not elsewhere classified: Secondary | ICD-10-CM | POA: Insufficient documentation

## 2021-04-10 HISTORY — DX: Cervicalgia: M54.2

## 2021-04-10 HISTORY — DX: Sacrococcygeal disorders, not elsewhere classified: M53.3

## 2021-04-10 MED ORDER — NEOMYCIN-POLYMYXIN-HC 3.5-10000-1 OT SOLN: 3.0000 [drp] | Freq: Three times a day (TID) | OTIC | 0 refills | Status: DC

## 2021-04-10 MED ORDER — ROSUVASTATIN CALCIUM 10 MG PO TABS: 10.0000 mg | ORAL_TABLET | Freq: Every day | ORAL | 3 refills | Status: DC

## 2021-04-10 MED ORDER — PFIZER-BIONT COVID-19 VAC-TRIS 30 MCG/0.3ML IM SUSP
INTRAMUSCULAR | 0 refills | Status: DC
  Filled 2021-04-10: qty 0.3, 1d supply, fill #0

## 2021-04-10 NOTE — Assessment & Plan Note (Signed)
Avoid offending foods, start probiotics. Do not eat large meals in late evening and consider raising head of bed. Doing well

## 2021-04-10 NOTE — Assessment & Plan Note (Addendum)
hgba1c acceptable, minimize simple carbs. Increase exercise as tolerated. Continue current meds, notes numbers below 70 but rarely. Continue 1 Metformin in the am for now but if numbers drop further and she is symptomatic will need to drop med further.

## 2021-04-10 NOTE — Assessment & Plan Note (Signed)
Doing well with nearly vegetarian diet.

## 2021-04-10 NOTE — Progress Notes (Signed)
Patient ID: Gail Lester, female    DOB: 1941/04/28  Age: 80 y.o. MRN: [id number]    Subjective:  Subjective  HPI Gail Lester presents for office visit today. She expresses interest in getting her ears flushed, because she is feeling soreness in her right ear that comes and goes. She denies any chest pain, SOB, fever, abdominal pain, cough, chills, sore throat, dysuria, urinary incontinence, back pain, HA, or N/VD. She states that her bowels are doing well under a nearly vegetarian diet. She reports that she takes her metformin first thing in the morning and takes her glucose readings shortly after. She reports her weight ranges from 145-149 lbs, but states that she was steadily over time losing the weight. She reports that she had cataracts surgery in both eyes that went well. She expresses concern regarding her recent dehydration and whether there is an OTC that can help with the dehydration. She reports that she still experiences muscle cramps a couple of times a week due to the dehydration. She states that she experiences soreness or arthritis pain when she is doing physical activity involving the hands. She states that the muscle spasms in her neck have not occurred this year, but expresses interest in refilling her muscle relaxer just in case she experiences the symptoms again.    Review of Systems  Constitutional: Negative for chills, fatigue and fever.  HENT: Positive for ear pain (right). Negative for congestion, rhinorrhea, sinus pressure, sinus pain and sore throat.   Eyes: Negative for pain.  Respiratory: Negative for cough and shortness of breath.   Cardiovascular: Negative for chest pain, palpitations and leg swelling.  Gastrointestinal: Negative for abdominal pain, blood in stool, diarrhea, nausea and vomiting.  Genitourinary: Negative for decreased urine volume, flank pain, frequency, vaginal bleeding and vaginal discharge.  Musculoskeletal: Positive for arthralgias (in hands).  Negative for back pain.       (+) muscle cramps due to dehydration  Neurological: Negative for headaches.    History Past Medical History:  Diagnosis Date  . Arthritis   . Constipation   . Diarrhea   . Dupuytren's contracture of both hands 02/10/2013  . Epidermal cyst 01/21/2017  . Fibrocystic breast 02/10/2013   Left h/o FN biopsy   . Ganglion cyst 11/14/2014   Right 2nd finger  . GERD (gastroesophageal reflux disease) 10/09/2020  . Hemangioma 09/28/2014  . History of cataracts    Previous surgery to remove bilaterally  . History of chicken pox    as a child  . History of colonic polyp 11/21/2017  . History of measles    as a child  . History of mumps    as a child  . Hypercholesteremia   . Hyperlipidemia, mixed 02/10/2013  . Melanocytic nevi, unspecified 09/28/2014  . Mild neurocognitive disorder, unclear etiology 11/21/2020  . 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 Onychomycosis 02/24/2017  . Osteopenia 05/23/2017  . Seborrheic keratosis 09/28/2014  . Type 2 diabetes mellitus 02/10/2013    She has a past surgical history that includes Appendectomy (1955); ORIF wrist fracture (12/09/2011); Breast biopsy ([id number]); Eye surgery (Right, 04/2019); and Eye surgery (Left, 03/2020).   Her family history includes Alcoholism in her mother; Alzheimer's disease in her maternal aunt; Cancer in her maternal aunt and maternal grandmother; Colon cancer in an other family member; Diabetes in her father; Kidney disease in her maternal grandmother and mother; Scoliosis in her son.She reports that she quit smoking about 31 years ago. She has never  used smokeless tobacco. She reports that she does not drink alcohol and does not use drugs.  Current Outpatient Medications on File Prior to Visit  Medication Sig Dispense Refill  . ACCU-CHEK AVIVA PLUS test strip CHECK BLOOD SUGAR 4 TIMES  DAILY AND AS NEEDED FOR  LABILE BLOOD SUGARS 100 each 11  . aspirin 81 MG tablet Take 81 mg by mouth daily.     . Blood Glucose Monitoring Suppl (ACCU-CHEK AVIVA PLUS) W/DEVICE KIT Use as directed 1 kit 0  . CALCIUM PO Take 630 mg by mouth daily.     . Cholecalciferol (VITAMIN D3) 50 MCG (2000 UT) TABS Take 1 tablet by mouth daily.    . Coenzyme Q10 (COQ10) 100 MG CAPS Take 1 capsule by mouth daily.    Marland Kitchen glimepiride (AMARYL) 4 MG tablet TAKE 1 TABLET BY MOUTH  TWICE DAILY 180 tablet 3  . Krill Oil 500 MG CAPS Take 1 capsule by mouth daily.    Marland Kitchen losartan (COZAAR) 25 MG tablet TAKE 1 TABLET BY MOUTH  DAILY 90 tablet 3  . LUTEIN PO Take 25 mg by mouth daily.    . metFORMIN (GLUCOPHAGE-XR) 500 MG 24 hr tablet TAKE 1 TABLET BY MOUTH  DAILY 90 tablet 3  . Multiple Vitamin (MULTIVITAMIN) tablet Take 1 tablet by mouth daily.     Marland Kitchen tiZANidine (ZANAFLEX) 2 MG tablet Take 0.5-2 tablets (1-4 mg total) by mouth every 6 (six) hours as needed for muscle spasms. 30 tablet 1   No current facility-administered medications on file prior to visit.     Objective:  Objective  Physical Exam Constitutional:      General: She is not in acute distress.    Appearance: Normal appearance. She is not ill-appearing or toxic-appearing.  HENT:     Head: Normocephalic and atraumatic.     Right Ear: Ear canal and external ear normal. Tympanic membrane is erythematous (mild redness in right ear canal).     Left Ear: Ear canal and external ear normal.     Nose: No congestion or rhinorrhea.  Eyes:     Extraocular Movements: Extraocular movements intact.     Pupils: Pupils are equal, round, and reactive to light.  Cardiovascular:     Rate and Rhythm: Normal rate and regular rhythm.     Pulses: Normal pulses.     Heart sounds: Normal heart sounds. No murmur heard.   Pulmonary:     Effort: Pulmonary effort is normal. No respiratory distress.     Breath sounds: Normal breath sounds. No wheezing, rhonchi or rales.  Abdominal:     General: Bowel sounds are normal.     Palpations: Abdomen is soft. There is no mass.      Tenderness: There is no abdominal tenderness. There is no guarding.     Hernia: No hernia is present.  Musculoskeletal:        General: Normal range of motion.     Cervical back: Normal range of motion and neck supple.  Skin:    General: Skin is warm and dry.  Neurological:     Mental Status: She is alert and oriented to person, place, and time.  Psychiatric:        Behavior: Behavior normal.    BP 122/60 (BP Location: Right Arm, Cuff Size: Large)   Pulse 80   Temp 98.2 F (36.8 C) (Oral)   Resp 12   Ht _0  (1.626 m)   Wt 153 lb 3.2 oz (69.5  kg)   SpO2 95%   BMI 26.30 kg/m  Wt Readings from Last 3 Encounters:  04/10/21 153 lb 3.2 oz (69.5 kg)  04/06/21 153 lb 6.4 oz (69.6 kg)  11/08/20 157 lb 3.2 oz (71.3 kg)     Lab Results  Component Value Date   WBC 6.8 10/06/2020   HGB 12.9 10/06/2020   HCT 38.1 10/06/2020   PLT 275 10/06/2020   GLUCOSE 68 10/06/2020   CHOL 139 10/06/2020   TRIG 109 10/06/2020   HDL 49 (L) 10/06/2020   LDLDIRECT 95.0 10/05/2019   LDLCALC 71 10/06/2020   ALT 17 10/06/2020   AST 24 10/06/2020   NA 139 10/06/2020   K 4.2 10/06/2020   CL 104 10/06/2020   CREATININE 0.62 10/06/2020   BUN 14 10/06/2020   CO2 25 10/06/2020   TSH 1.55 10/06/2020   HGBA1C 7.6 (H) 10/06/2020   MICROALBUR 1.6 11/03/2015    MM 3D SCREEN BREAST BILATERAL  Result Date: 04/11/2020 CLINICAL DATA:  Screening. EXAM: DIGITAL SCREENING BILATERAL MAMMOGRAM WITH TOMO AND CAD COMPARISON:  Previous exam(s). ACR Breast Density Category b: There are scattered areas of fibroglandular density. FINDINGS: There are no findings suspicious for malignancy. Images were processed with CAD. IMPRESSION: No mammographic evidence of malignancy. A result letter of this screening mammogram will be mailed directly to the patient. RECOMMENDATION: Screening mammogram in one year. (Code:SM-B-01Y) BI-RADS CATEGORY  1: Negative. Electronically Signed   By: Lovey Newcomer M.D.   On: 04/11/2020 15:41      Assessment & Plan:  Plan    Meds ordered this encounter  Medications  . rosuvastatin (CRESTOR) 10 MG tablet    Sig: Take 1 tablet (10 mg total) by mouth daily.    Dispense:  90 tablet    Refill:  3  . neomycin-polymyxin-hydrocortisone (CORTISPORIN) OTIC solution    Sig: Place 3 drops into the right ear 3 (three) times daily.    Dispense:  10 mL    Refill:  0    Problem List Items Addressed This Visit    Type 2 diabetes mellitus (HCC)    hgba1c acceptable, minimize simple carbs. Increase exercise as tolerated. Continue current meds, notes numbers below 70 but rarely. Continue 1 Metformin in the am for now but if numbers drop further and she is symptomatic will need to drop med further.       Relevant Medications   rosuvastatin (CRESTOR) 10 MG tablet   Other Relevant Orders   Hemoglobin A1c   Comprehensive metabolic panel   TSH   Hyperlipidemia, mixed    Encouraged heart healthy diet, increase exercise, avoid trans fats, consider a krill oil cap daily. Tolerating statin      Relevant Medications   rosuvastatin (CRESTOR) 10 MG tablet   Other Relevant Orders   Lipid panel   Diarrhea    Doing well with nearly vegetarian diet.       Relevant Orders   CBC   Osteopenia    Encouraged to get adequate exercise, calcium and vitamin d intake.      Muscle cramps    Supplement and monitor, is not drinking adequate water.       Relevant Orders   Magnesium   GERD (gastroesophageal reflux disease)    Avoid offending foods, start probiotics. Do not eat large meals in late evening and consider raising head of bed. Doing well      Mild neurocognitive disorder, unclear etiology    Neuropsychology testing showed mild cognitive  impairment. Recommend repeat testing in 18 months and consideration of MRI of brain for further information      Sacral pain    Notes persistent pain since a fall caused a sacral fracture 2 years ago. Much worse after prolonged sitting. Encouraged to stay  active. Likely arthritic, try Voltaren gel and lidocaine gel      Neck pain    Encouraged moist heat and gentle stretching as tolerated. May try NSAIDs and prescription meds as directed and report if symptoms worsen or seek immediate care. Muscle relaxer helps when she needs it.       Otitis externa    Cortisporin otic prn right ear       Other Visit Diagnoses    Other problems related to lifestyle    -  Primary   Relevant Orders   Hepatitis C antibody   Diabetes mellitus type 2 in obese (HCC)   (Chronic)     Relevant Medications   rosuvastatin (CRESTOR) 10 MG tablet      Follow-up: Return in about 6 months (around 10/10/2021) for annual exam.   I,David Hanna,acting as a scribe for Penni Homans, MD.,have documented all relevant documentation on the behalf of Penni Homans, MD,as directed by  Penni Homans, MD while in the presence of Penni Homans, MD.  I, Mosie Lukes, MD personally performed the services described in this documentation. All medical record entries made by the scribe were at my direction and in my presence. I have reviewed the chart and agree that the record reflects my personal performance and is accurate and complete

## 2021-04-10 NOTE — Patient Instructions (Signed)
Consider Voltaren and/or Lidocaine to painful area Carbohydrate Counting for Diabetes Mellitus, Adult Carbohydrate counting is a method of keeping track of how many carbohydrates you eat. Eating carbohydrates naturally increases the amount of sugar (glucose) in the blood. Counting how many carbohydrates you eat improves your blood glucose control, which helps you manage your diabetes. It is important to know how many carbohydrates you can safely have in each meal. This is different for every person. A dietitian can help you make a meal plan and calculate how many carbohydrates you should have at each meal and snack. What foods contain carbohydrates? Carbohydrates are found in the following foods:  Grains, such as breads and cereals.  Dried beans and soy products.  Starchy vegetables, such as potatoes, peas, and corn.  Fruit and fruit juices.  Milk and yogurt.  Sweets and snack foods, such as cake, cookies, candy, chips, and soft drinks.   How do I count carbohydrates in foods? There are two ways to count carbohydrates in food. You can read food labels or learn standard serving sizes of foods. You can use either of the methods or a combination of both. Using the Nutrition Facts label The Nutrition Facts list is included on the labels of almost all packaged foods and beverages in the [place] It includes:  The serving size.  Information about nutrients in each serving, including the grams (g) of carbohydrate per serving. To use the Nutrition Facts:  Decide how many servings you will have.  Multiply the number of servings by the number of carbohydrates per serving.  The resulting number is the total amount of carbohydrates that you will be having. Learning the standard serving sizes of foods When you eat carbohydrate foods that are not packaged or do not include Nutrition Facts on the label, you need to measure the servings in order to count the amount of carbohydrates.  Measure the  foods that you will eat with a food scale or measuring cup, if needed.  Decide how many standard-size servings you will eat.  Multiply the number of servings by 15. For foods that contain carbohydrates, one serving equals 15 g of carbohydrates. ? For example, if you eat 2 cups or 10 oz (300 g) of strawberries, you will have eaten 2 servings and 30 g of carbohydrates (2 servings x 15 g = 30 g).  For foods that have more than one food mixed, such as soups and casseroles, you must count the carbohydrates in each food that is included. The following list contains standard serving sizes of common carbohydrate-rich foods. Each of these servings has about 15 g of carbohydrates:  1 slice of bread.  1 six-inch (15 cm) tortilla.  ? cup or 2 oz (53 g) cooked rice or pasta.   cup or 3 oz (85 g) cooked or canned, drained and rinsed beans or lentils.   cup or 3 oz (85 g) starchy vegetable, such as peas, corn, or squash.   cup or 4 oz (120 g) hot cereal.   cup or 3 oz (85 g) boiled or mashed potatoes, or  or 3 oz (85 g) of a large baked potato.   cup or 4 fl oz (118 mL) fruit juice.  1 cup or 8 fl oz (237 mL) milk.  1 small or 4 oz (106 g) apple.   or 2 oz (63 g) of a medium banana.  1 cup or 5 oz (150 g) strawberries.  3 cups or 1 oz (24 g) popped popcorn.  What is an example of carbohydrate counting? To calculate the number of carbohydrates in this sample meal, follow the steps shown below. Sample meal  3 oz (85 g) chicken breast.  ? cup or 4 oz (106 g) brown rice.   cup or 3 oz (85 g) corn.  1 cup or 8 fl oz (237 mL) milk.  1 cup or 5 oz (150 g) strawberries with sugar-free whipped topping. Carbohydrate calculation 1. Identify the foods that contain carbohydrates: ? Rice. ? Corn. ? Milk. ? Strawberries. 2. Calculate how many servings you have of each food: ? 2 servings rice. ? 1 serving corn. ? 1 serving milk. ? 1 serving strawberries. 3. Multiply each number of  servings by 15 g: ? 2 servings rice x 15 g = 30 g. ? 1 serving corn x 15 g = 15 g. ? 1 serving milk x 15 g = 15 g. ? 1 serving strawberries x 15 g = 15 g. 4. Add together all of the amounts to find the total grams of carbohydrates eaten: ? 30 g + 15 g + 15 g + 15 g = 75 g of carbohydrates total. What are tips for following this plan? Shopping  Develop a meal plan and then make a shopping list.  Buy fresh and frozen vegetables, fresh and frozen fruit, dairy, eggs, beans, lentils, and whole grains.  Look at food labels. Choose foods that have more fiber and less sugar.  Avoid processed foods and foods with added sugars. Meal planning  Aim to have the same amount of carbohydrates at each meal and for each snack time.  Plan to have regular, balanced meals and snacks. Where to find more information  American Diabetes Association: www.diabetes.org  Centers for Disease Control and Prevention: http://www.wolf.info/ Summary  Carbohydrate counting is a method of keeping track of how many carbohydrates you eat.  Eating carbohydrates naturally increases the amount of sugar (glucose) in the blood.  Counting how many carbohydrates you eat improves your blood glucose control, which helps you manage your diabetes.  A dietitian can help you make a meal plan and calculate how many carbohydrates you should have at each meal and snack. This information is not intended to replace advice given to you by your health care provider. Make sure you discuss any questions you have with your health care provider. Document Revised: 12/10/2019 Document Reviewed: 12/11/2019 Elsevier Patient Education  2021 [employer].

## 2021-04-10 NOTE — Assessment & Plan Note (Signed)
Notes persistent pain since a fall caused a sacral fracture 2 years ago. Much worse after prolonged sitting. Encouraged to stay active. Likely arthritic, try Voltaren gel and lidocaine gel

## 2021-04-10 NOTE — Assessment & Plan Note (Signed)
Encouraged heart healthy diet, increase exercise, avoid trans fats, consider a krill oil cap daily. Tolerating statin 

## 2021-04-10 NOTE — Assessment & Plan Note (Addendum)
Supplement and monitor, is not drinking adequate water.

## 2021-04-10 NOTE — Assessment & Plan Note (Signed)
Cortisporin otic prn right ear

## 2021-04-10 NOTE — Assessment & Plan Note (Signed)
Encouraged moist heat and gentle stretching as tolerated. May try NSAIDs and prescription meds as directed and report if symptoms worsen or seek immediate care. Muscle relaxer helps when she needs it.

## 2021-04-10 NOTE — Assessment & Plan Note (Signed)
Neuropsychology testing showed mild cognitive impairment. Recommend repeat testing in 18 months and consideration of MRI of brain for further information

## 2021-04-10 NOTE — Assessment & Plan Note (Signed)
Encouraged to get adequate exercise, calcium and vitamin d intake 

## 2021-04-11 LAB — COMPREHENSIVE METABOLIC PANEL
ALT: 18 U/L (ref 0–35)
AST: 23 U/L (ref 0–37)
Albumin: 3.9 g/dL (ref 3.5–5.2)
Alkaline Phosphatase: 53 U/L (ref 39–117)
BUN: 12 mg/dL (ref 6–23)
CO2: 29 mEq/L (ref 19–32)
Calcium: 10.2 mg/dL (ref 8.4–10.5)
Chloride: 102 mEq/L (ref 96–112)
Creatinine, Ser: 0.7 mg/dL (ref 0.40–1.20)
GFR: 82.05 mL/min (ref >60.00)
Glucose, Bld: 113 mg/dL — ABNORMAL HIGH (ref 70–99)
Potassium: 4.6 mEq/L (ref 3.5–5.1)
Sodium: 138 mEq/L (ref 135–145)
Total Bilirubin: 0.4 mg/dL (ref 0.2–1.2)
Total Protein: 6.8 g/dL (ref 6.0–8.3)

## 2021-04-11 LAB — LIPID PANEL
Cholesterol: 145 mg/dL (ref 0–200)
HDL: 50.1 mg/dL (ref >39.00)
NonHDL: 95.17
Total CHOL/HDL Ratio: 3
Triglycerides: 236 mg/dL — ABNORMAL HIGH (ref 0.0–149.0)
VLDL: 47.2 mg/dL — ABNORMAL HIGH (ref 0.0–40.0)

## 2021-04-11 LAB — CBC
HCT: 39 % (ref 36.0–46.0)
Hemoglobin: 12.9 g/dL (ref 12.0–15.0)
MCHC: 33 g/dL (ref 30.0–36.0)
MCV: 88 fl (ref 78.0–100.0)
Platelets: 303 10*3/uL (ref 150.0–400.0)
RBC: 4.43 Mil/uL (ref 3.87–5.11)
RDW: 13.2 % (ref 11.5–15.5)
WBC: 7.3 10*3/uL (ref 4.0–10.5)

## 2021-04-11 LAB — HEMOGLOBIN A1C: Hgb A1c MFr Bld: 7.5 % — ABNORMAL HIGH (ref 4.6–6.5)

## 2021-04-11 LAB — LDL CHOLESTEROL, DIRECT: Direct LDL: 83 mg/dL

## 2021-04-11 LAB — MAGNESIUM: Magnesium: 1.9 mg/dL (ref 1.5–2.5)

## 2021-04-11 LAB — TSH: TSH: 2.92 u[IU]/mL (ref 0.35–4.50)

## 2021-04-11 LAB — HEPATITIS C ANTIBODY
Hepatitis C Ab: NONREACTIVE
SIGNAL TO CUT-OFF: 0 (ref <1.00)

## 2021-05-10 ENCOUNTER — Other Ambulatory Visit: Payer: Self-pay

## 2021-05-10 ENCOUNTER — Ambulatory Visit (INDEPENDENT_AMBULATORY_CARE_PROVIDER_SITE_OTHER): Payer: Medicare Other | Admitting: Pharmacist

## 2021-05-10 DIAGNOSIS — E119 Type 2 diabetes mellitus without complications: Secondary | ICD-10-CM

## 2021-05-10 DIAGNOSIS — E782 Mixed hyperlipidemia: Secondary | ICD-10-CM | POA: Diagnosis not present

## 2021-05-10 DIAGNOSIS — M8580 Other specified disorders of bone density and structure, unspecified site: Secondary | ICD-10-CM

## 2021-05-10 NOTE — Chronic Care Management (AMB) (Signed)
Chronic Care Management Pharmacy Note  05/10/2021 Name:  Gail Lester MRN:  [id number] DOB:  04/09/1941  Subjective: Gail Lester is an 80 y.o. year old female who is a primary patient of Mosie Lukes, MD.  The CCM team was consulted for assistance with disease management and care coordination needs.    Engaged with patient face to face for follow up visit in response to provider referral for pharmacy case management and/or care coordination services.   Consent to Services:  The patient was given information about Chronic Care Management services, agreed to services, and gave verbal consent prior to initiation of services.  Please see initial visit note for detailed documentation.   Patient Care Team: Mosie Lukes, MD as PCP - General (Family Medicine) Sharyne Peach, MD as Consulting Physician (Ophthalmology) Martinique, Amy, MD as Consulting Physician (Dermatology) [name], PharmD (Pharmacist)  Recent office visits: 04/10/2021 - PCP (Dr Charlett Blake) - f/u chronic conditions; changed rosuvastatin from 29m 1/2 tablet daily to 151mdaily - new Rx sent in; Prescribed Cortisporin Otic Soln - 3 drops in the right eat tid; Osteopenia - Encouraged to get adequate exercise, calcium and vitamin d intake. Sacral pain - Notes persistent pain since a fall caused a sacral fracture 2 years ago. Likely arthritic, try Voltaren gel and lidocaine gel.  Recent consult visits: 11/28/20 Neurology MeHazle CocaPHD. Mild Neurocognitive. No medications started / changed  Hospital visits: None in previous 6 months  Objective:  Lab Results  Component Value Date   CREATININE 0.70 04/10/2021   CREATININE 0.62 10/06/2020   CREATININE 0.69 04/05/2020    Lab Results  Component Value Date   HGBA1C 7.5 (H) 04/10/2021   Last diabetic Eye exam:  Lab Results  Component Value Date/Time   HMDIABEYEEXA No Retinopathy 04/25/2020 12:00 AM    Last diabetic Foot exam:  Lab Results  Component Value  Date/Time   HMDIABFOOTEX done; normal 08/24/2012 12:00 AM        Component Value Date/Time   CHOL 145 04/10/2021 1412   TRIG 236.0 (H) 04/10/2021 1412   HDL 50.10 04/10/2021 1412   CHOLHDL 3 04/10/2021 1412   VLDL 47.2 (H) 04/10/2021 1412   LDLCALC 71 10/06/2020 1423   LDLDIRECT 83.0 04/10/2021 1412    Hepatic Function Latest Ref Rng & Units 04/10/2021 10/06/2020 04/05/2020  Total Protein 6.0 - 8.3 g/dL 6.8 6.6 6.8  Albumin 3.5 - 5.2 g/dL 3.9 - 4.3  AST 0 - 37 U/L '23 24 21  ' ALT 0 - 35 U/L '18 17 18  ' Alk Phosphatase 39 - 117 U/L 53 - 51  Total Bilirubin 0.2 - 1.2 mg/dL 0.4 0.5 0.4  Bilirubin, Direct 0.0 - 0.3 mg/dL - - -    Lab Results  Component Value Date/Time   TSH 2.92 04/10/2021 02:12 PM   TSH 1.55 10/06/2020 02:23 PM    CBC Latest Ref Rng & Units 04/10/2021 10/06/2020 04/05/2020  WBC 4.0 - 10.5 K/uL 7.3 6.8 8.1  Hemoglobin 12.0 - 15.0 g/dL 12.9 12.9 13.5  Hematocrit 36.0 - 46.0 % 39.0 38.1 39.9  Platelets 150.0 - 400.0 K/uL 303.0 275 285.0    Lab Results  Component Value Date/Time   VD25OH 58.47 11/03/2015 08:32 AM   VD25OH 79 11/24/2012 10:38 AM    Clinical ASCVD: No  The 10-year ASCVD risk score (GMikey BussingC Jr., et al., 2013) is: 48.2%   Values used to calculate the score:     Age: [age]ears  Sex: Female     Is Non-Hispanic African American: No     Diabetic: Yes     Tobacco smoker: No     Systolic Blood Pressure: 950 mmHg     Is BP treated: Yes     HDL Cholesterol: 50.1 mg/dL     Total Cholesterol: 145 mg/dL    Other: (CHADS2VASc if Afib, PHQ9 if depression, MMRC or CAT for COPD, ACT, DEXA)  Social History   Tobacco Use  Smoking Status Former Smoker  . Quit date: 11/17/1989  . Years since quitting: 31.4  Smokeless Tobacco Never Used   BP Readings from Last 3 Encounters:  04/10/21 122/60  04/06/21 122/60  11/08/20 (!) 122/58   Pulse Readings from Last 3 Encounters:  04/10/21 80  04/06/21 72  11/08/20 70   Wt Readings from Last 3 Encounters:   04/10/21 153 lb 3.2 oz (69.5 kg)  04/06/21 153 lb 6.4 oz (69.6 kg)  11/08/20 157 lb 3.2 oz (71.3 kg)    Assessment: Review of patient past medical history, allergies, medications, health status, including review of consultants reports, laboratory and other test data, was performed as part of comprehensive evaluation and provision of chronic care management services.   SDOH:  (Social Determinants of Health) assessments and interventions performed:  SDOH Interventions   Flowsheet Row Most Recent Value  SDOH Interventions   Financial Strain Interventions Intervention Not Indicated  Physical Activity Interventions Intervention Not Indicated      CCM Care Plan  Allergies  Allergen Reactions  . Ramipril Diarrhea  . Lipitor [Atorvastatin]     Diarrhea, muscle cramps  . Lisinopril     myalgias    Medications Reviewed Today    Reviewed by [name], PharmD (Pharmacist) on 05/10/21 at 71  Med List Status: <None>  Medication Order Taking? Sig Documenting Provider Last Dose Status Informant  ACCU-CHEK AVIVA PLUS test strip [id number] Yes CHECK BLOOD SUGAR 4 TIMES  DAILY AND AS NEEDED FOR  LABILE BLOOD SUGARS Mosie Lukes, MD Taking Active   aspirin 81 MG tablet [id number] Yes Take 81 mg by mouth daily. [provider] Taking Active   Blood Glucose Monitoring Suppl (ACCU-CHEK AVIVA PLUS) W/DEVICE KIT [id number] Yes Use as directed Mosie Lukes, MD Taking Active   CALCIUM PO [id number] Yes Take 630 mg by mouth daily. Taking 2 tablets of calcium citrate 315 each - 626m total [provider] Taking Active   Cholecalciferol (VITAMIN D3) 50 MCG (2000 UT) TABS [id number] Take 1 tablet by mouth daily. [provider] Taking Active   COVID-19 mRNA Vac-TriS, Pfizer, (PFIZER-BIONT COVID-19 VAC-TRIS) SUSP injection [id number] Inject into the muscle. SCarlyle Basques MD  Active   glimepiride (AMARYL) 4 MG tablet [id number] TAKE 1 TABLET BY MOUTH  TWICE DAILY  BMosie Lukes MD Taking Active   Krill Oil 500 MG CAPS [id number] Take 1 capsule by mouth daily. [provider] Taking Active   losartan (COZAAR) 25 MG tablet [id number] TAKE 1 TABLET BY MOUTH  DAILY BMosie Lukes MD Taking Active   LUTEIN PO [id number] Take 25 mg by mouth daily. [provider] Taking Active            Med Note (Norva PavlovNov 16, 2021  7:48 PM) For eye health, recommended by ophthalmologist  metFORMIN (GLUCOPHAGE-XR) 500 MG 24 hr tablet [id number] TAKE 1 TABLET BY MOUTH  DAILY BMosie Lukes MD Taking Active  Multiple Vitamin (MULTIVITAMIN) tablet [id number] Yes Take 1 tablet by mouth daily.  [provider] Taking Active   neomycin-polymyxin-hydrocortisone (CORTISPORIN) OTIC solution [id number] Yes Place 3 drops into the right ear 3 (three) times daily. Mosie Lukes, MD Taking Active   rosuvastatin (CRESTOR) 10 MG tablet [id number] Yes Take 1 tablet (10 mg total) by mouth daily. Mosie Lukes, MD Taking Active   tiZANidine (ZANAFLEX) 2 MG tablet [id number] Yes Take 0.5-2 tablets (1-4 mg total) by mouth every 6 (six) hours as needed for muscle spasms. Mosie Lukes, MD Taking Active   Med List Note Sheldon Silvan, CPhT 12/08/11 1422): Patient uses Engineer, mining.           Patient Active Problem List   Diagnosis Date Noted  . Sacral pain 04/10/2021  . Neck pain 04/10/2021  . Otitis externa 04/10/2021  . Mild neurocognitive disorder, unclear etiology 11/21/2020  . GERD (gastroesophageal reflux disease) 10/09/2020  . Muscle cramps 04/24/2018  . Osteopenia 05/23/2017  . Onychomycosis 02/24/2017  . Constipation 02/15/2017  . Epidermal cyst 01/21/2017  . Disorder of capillaries 01/16/2016  . Rosacea 11/08/2015  . 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 Rotator cuff arthropathy 11/24/2014  . Dysplastic nevus 10/11/2014  . Seborrheic keratosis 09/28/2014  . Melanocytic nevi, unspecified 09/28/2014  .  Hemangioma 09/28/2014  . Overweight 06/14/2014  . Diarrhea 02/10/2013  . Type 2 diabetes mellitus ([place]) 02/10/2013  . Hyperlipidemia, mixed 02/10/2013  . Fibrocystic breast 02/10/2013  . Dupuytren's contracture of both hands 02/10/2013    Immunization History  Administered Date(s) Administered  . Fluad Quad(high Dose 65+) 08/28/2019, 10/04/2020  . Influenza, High Dose Seasonal PF 09/11/2016, 10/03/2017, 08/26/2018  . Influenza,inj,Quad PF,6+ Mos 09/02/2013, 08/31/2014, 09/21/2015  . PFIZER Comirnaty(Gray Top)Covid-19 Tri-Sucrose Vaccine 04/06/2021  . PFIZER(Purple Top)SARS-COV-2 Vaccination 01/28/2020, 02/22/2020, 09/18/2020  . Pneumococcal Conjugate-13 02/04/2014  . Pneumococcal Polysaccharide-23 01/12/2007  . Tdap 01/13/2008, 10/06/2019  . Zoster 01/12/2007  . Zoster Recombinat (Shingrix) 08/05/2018, 10/01/2018    Conditions to be addressed/monitored: HLD, DMII and osteopenia; pain in sacral area with h/o fracure; mild congnitive impairment  Care Plan : General Pharmacy (Adult)  Updates made by [name], PHARMD since 05/10/2021 12:00 AM    Problem: Medication management and chronic disease management support, education and care coordination need related to HDL, T2DM, osteopenia, sacral pain   Note:   Current Barriers:  . Unable to achieve control of elevated triglycerides and pain in sacral area  . Chronic Disease Management support, education, and care coordination needs related to Hyperlipidemia, sacral pain; Diabetes, Osteopenia  Pharmacist Clinical Goal(s):  Marland Kitchen Over the next 180 days, patient will achieve adherence to monitoring guidelines and medication adherence to achieve therapeutic efficacy . achieve control of Triglycerides and pain as evidenced by Tg <150 and reporting improved sacral pain . maintain control of diabetes and LDL as evidenced by A1c 7.5% or less and LDL <100  through collaboration with PharmD and provider.   Interventions: . 1:1 collaboration  with Mosie Lukes, MD regarding development and update of comprehensive plan of care as evidenced by provider attestation and co-signature . Inter-disciplinary care team collaboration (see longitudinal plan of care) . Comprehensive medication review performed; medication list updated in electronic medical record  Hypertension Screening . Controlled; BP goal <140/90 . Current regimen:  o Losartan 81m daily . Interventions: o Discussed blood pressure goals o Maintain hypertension medication regimen.  o Check blood pressure if you feel "swimmy headed"/ dizziness  Hyperlipidemia .  LDL at goal of <100; Tg not at goal of <150 . Current regimen:  . Rosuvastatin 5m - take 1 tablet daily (helps to lower LDL / "bad cholesterol") . Krill Oil 5032m- take 1 tablet daily (helps to lower triglycerides) . Interventions: o Discussed recent lipid results and goals o Dicussed diet and exercise o Discussed possibly increasing Krill Oil to bid but patient states tablet if very large and prefers to only take one per day o Recommended maintain cholesterol medication regimen.  o Continue to follow diet rich in vegetables and low in saturated and trans fat (limiting animal fat / products and processed foods)   Diabetes . Controlled and has achieves A1c goal 7.5% . Current glucose readings:  . Current meal patterns: reports that meal schedule is variable and sometimes eats small meal in evening.  . Current regimen:  . Marland Kitchenlimepiride 66m73mwice daily with morning and evening meal  . Metformin XR 500m31mily . Interventions: o Discussed goals for diabetes o Monitor for low blood glucose - less than 70. Reviewed how to identify and treat hypoglycemia. If frequency of hypoglycemia increases could consider lowering glimepiride dose to 66mg 18mh morning meal and 2mg (55m tablet) with evening meal.  o Check blood sugar once daily, document, and provide at future appointments  Osteopenia . History of sacral  fracture in 2020 . Current regimen:  . Calcium citrate 315mg -26me 2 tablets - 630mg da67m. Vitamin D 2000 units daily . Last DEXA 11/27/2018 o Forearm Radius 33% T-score =  -2.0. (was -1.6 on 11/13/2016) o AP Spine L1-L2 T-Score = 1.8 (was 1.4 on 11/13/2016)  o Dual Femur Total T-Score =  (was 0.3 on 11/13/2016) o FRAX* 10-year Probability of Fracture - Based on femoral neck BMD: DualFemur (Right) - Major Osteoporotic Fracture: 17.6% - Hip Fracture:                3.3%  . Interventions:  o Consider repeat DEXA Scan o Continue to take current calcium and vitamin D supplements o Continue to walk daily o Discussed fall prevention  Pain in sacral area (history of sacral fracture):  . PharmaMarland Kitchenist Clinical Goal(s) o Over the next 180 days, patient will work with PharmD and providers to reduce pain in sacral area . Current regimen:  . none . Interventions: o Dr Blyth's Frederik Peartes mentioned trial of either Voltaren / diclofenac gel - apply to area of pain as needed up to 4 times a day or lidocaine gel / patch - apply to area of pain daily for 12 hours then remove patch for 12 hours. Discussed pros and cons of both these options.  o Fall assessment completed . Patient self care activities - Over the next 180 days, patient will: o Purchase Voltaren / diclofenac gel over the counter - apply up to 4 times a day to area of pain on back / bottom as needed.  o Practice fall prevention techniques  Medication management . Pharmacist Clinical Goal(s): o Over the next 180 days, patient will work with PharmD and providers to achieve optimal medication adherence . [facility] o Comprehensive medication review performed. o Continue current medication management strategy . Patient self care activities - Over the next 180 days, patient will: o Focus on medication adherence by filling and taking medications appropriately  o Take medications as  prescribed o Report any questions or concerns to PharmD and/or provider(s)  Patient Goals/Self-Care Activities . Over the  next 180 days, patient will:  take medications as prescribed, check glucose daily, document, and provide at future appointments, target a minimum of 150 minutes of moderate intensity exercise weekly, engage in dietary modifications by limiting intake of saturated and trans fat, and sugar / simple carbohydrates.   Follow Up Plan: Telephone follow up appointment with care management team member scheduled for:  6 months with clinical pharmacist; pharmacy assistant will check in with patient regarding home BG readings in 2-3 months.     H     Medication Assistance: Patient mentions that generic Cortisporin Ear Drops were more expensive that she was expecting at $47. Reviewed her formulary and they are listed as tier 3 which is $47 copay. Reviewed GoodRx price and is more than her coapy at $25. Patient [place] with cost for now.   Patient's preferred pharmacy is:  [facility], [address] [place], [address] [place], [place] [address] Phone: [phone] Fax: [phone]  [facility] [address] [place] [address] [place] [address] Phone: [phone] Fax: [phone]  Pt states she was having difficulty remembering to use Cortisporin ear drops 3 times per day. Discussed ways to improve adherence. She will start by placing drops at bedside and setting alarm 20 minutes earlier to get first dose before she gets started for day. Will also plan to get last dose when she lies down for the night. She will also set alarm for middle of day for the 2nd dose.   She is also to see oral surgeon next week regarding infection of implanted tooth that he has been treating / following over the last 3 months. Recommended she mention ear pain to him as well incase there is a connection to dental  issues. She is reminded to call Dr Charlett Blake if ear pain is not improved   Follow Up:  Patient agrees to Care Plan and Follow-up.  Plan: Telephone follow up appointment with care management team member scheduled for:  6 months  Pharmacy team assistant will outreach patient to check on home BG reading in 2 to 3 months.   [name], PharmD Clinical Pharmacist [facility]

## 2021-05-10 NOTE — Patient Instructions (Signed)
Visit Information Gail Lester, It was a pleasure speaking with you today. Please feel free to contact me if you have any questions or concerns. Below is information regarding you health goals.   Keep up the good work!  Gail Lester, PharmD Clinical Pharmacist [facility]ail Lester [facility] [phone]  PATIENT GOALS: Goals Addressed            This Visit's Progress   . Chronic Care Management Pharmacy Care Plan   On track    CARE PLAN ENTRY (see longitudinal plan of care for additional care plan information)  Current Barriers:  . Chronic Disease Management support, education, and care coordination needs related to Hyperlipidemia, Diabetes, Osteopenia   Hypertension Screening BP Readings from Last 3 Encounters:  04/10/21 122/60  04/06/21 122/60  11/08/20 (!) 122/58   . Pharmacist Clinical Goal(s): o Over the next 180 days, patient will work with PharmD and providers to maintain BP goal <140/90 . Current regimen:  o Losartan 25mg  daily . Interventions: o Discussed blood pressure goals . Patient self care activities - Over the next 180 days, patient will: o Maintain hypertension medication regimen.  o Check blood pressure if you feel "swimmy headed"/ dizziness  Hyperlipidemia Lipid Panel     Component Value Date/Time   CHOL 145 04/10/2021 1412   TRIG 236.0 (H) 04/10/2021 1412   HDL 50.10 04/10/2021 1412   CHOLHDL 3 04/10/2021 1412   VLDL 47.2 (H) 04/10/2021 1412   LDLDIRECT 83.0 04/10/2021 1412   . Pharmacist Clinical Goal(s): o Over the next 180 days, patient will work with PharmD and providers to maintain LDL goal < 100 and attain Triglycerides <150 . Current regimen:  . Rosuvastatin 10mg  - take 1 tablet daily (helps to lower LDL / "bad cholesterol") . Krill Oil 500mg  - take 1 tablet daily (helps to lower triglycerides) . Interventions: o Discussed recent lipid results and goals o Dicussed diet and exercise . Patient self care activities -  Over the next 180 days, patient will: o Maintain cholesterol medication regimen.  o Continue to follow diet rich in vegetables and low in saturated and trans fat (limiting animal fat / products and processed foods)   Diabetes Lab Results  Component Value Date/Time   HGBA1C 7.5 (H) 04/10/2021 02:12 PM   HGBA1C 7.6 (H) 10/06/2020 02:23 PM   . Pharmacist Clinical Goal(s): o Over the next 180 days, patient will work with PharmD and providers to achieve A1c goal  7.5% . Current regimen:  Marland Kitchen Glimepiride 4mg  twice daily with morning and evening meal  . Metformin XR 500mg  daily . Interventions: o Discussed goals for diabetes o Monitor for low blood glucose - less than 70. Reviewed how to identify and treat hypoglycemia . Patient self care activities - Over the next 180 days, patient will: o Check blood sugar once daily, document, and provide at future appointments o Contact provider with increased frequency of hypoglycemia (more than once per week) o Continue current regimen for diabetes.  Osteopenia . Pharmacist Clinical Goal(s) o Over the next 180 days, patient will work with PharmD and providers to reduce risk of fracture due to osteopenia . Current regimen:  . Calcium citrate 315mg  - take 2 tablets - 630mg  daily . Vitamin D 2000 units daily . Interventions: o Discussed intake of 1200mg  of calcium daily through diet and/or supplementation o Discussed intake of [phone] units of vitamin D through supplementation  o Consider repeat DEXA Scan . Patient self care activities - Over the  next 180 days, patient will: o Continue to take current calcium and vitamin D supplements o Continue to walk daily - helps to keep bones health o Practice fall prevention techniques o Consider repeat DEXA Scan  Pain in sacral area (history of sacral fracture):  Marland Kitchen Pharmacist Clinical Goal(s) o Over the next 180 days, patient will work with PharmD and providers to reduce pain in sacral area . Current  regimen:  . none . Interventions: o Dr Frederik Pear last notes mentioned trial of either Voltaren / diclofenac gel - apply to area of pain as needed up to 4 times a day or lidocaine gel / patch - apply to area of pain daily for 12 hours then remove patch for 12 hours. Discussed pros and cons of both these options.  o Fall assessment completed . Patient self care activities - Over the next 180 days, patient will: o Purchase Voltaren / diclofenac gel over the counter - apply up to 4 times a day to area of pain on back / bottom as needed.  o Practice fall prevention techniques  Medication management . Pharmacist Clinical Goal(s): o Over the next 180 days, patient will work with PharmD and providers to achieve optimal medication adherence . Current pharmacy: [facility] . Interventions o Comprehensive medication review performed. o Continue current medication management strategy . Patient self care activities - Over the next 180 days, patient will: o Focus on medication adherence by filling and taking medications appropriately  o Take medications as prescribed o Report any questions or concerns to PharmD and/or provider(s)  Please see past updates related to this goal by clicking on the "Past Updates" button in the selected goal         Fall Prevention in the Home, Adult Falls can cause injuries and can affect people from all age groups. There are many simple things that you can do to make your home safe and to help prevent falls. Ask for help when making these changes, if needed. What actions can I take to prevent falls? General instructions  Use good lighting in all rooms. Replace any light bulbs that burn out.  Turn on lights if it is dark. Use night-lights.  Place frequently used items in easy-to-reach places. Lower the shelves around your home if necessary.  Set up furniture so that there are clear paths around it. Avoid moving your furniture around.  Remove throw rugs and  other tripping hazards from the floor.  Avoid walking on wet floors.  Fix any uneven floor surfaces.  Add color or contrast paint or tape to grab bars and handrails in your home. Place contrasting color strips on the first and last steps of stairways.  When you use a stepladder, make sure that it is completely opened and that the sides are firmly locked. Have someone hold the ladder while you are using it. Do not climb a closed stepladder.  Be aware of any and all pets. What can I do in the bathroom?  Keep the floor dry. Immediately clean up any water that spills onto the floor.  Remove soap buildup in the tub or shower on a regular basis.  Use non-skid mats or decals on the floor of the tub or shower.  Attach bath mats securely with double-sided, non-slip rug tape.  If you need to sit down while you are in the shower, use a plastic, non-slip stool.  Install grab bars by the toilet and in the tub and shower. Do not use  towel bars as grab bars.      What can I do in the bedroom?  Make sure that a bedside light is easy to reach.  Do not use oversized bedding that drapes onto the floor.  Have a firm chair that has side arms to use for getting dressed. What can I do in the kitchen?  Clean up any spills right away.  If you need to reach for something above you, use a sturdy step stool that has a grab bar.  Keep electrical cables out of the way.  Do not use floor polish or wax that makes floors slippery. If you must use wax, make sure that it is non-skid floor wax. What can I do in the stairways?  Do not leave any items on the stairs.  Make sure that you have a light switch at the top of the stairs and the bottom of the stairs. Have them installed if you do not have them.  Make sure that there are handrails on both sides of the stairs. Fix handrails that are broken or loose. Make sure that handrails are as long as the stairways.  Install non-slip stair treads on all stairs  in your home.  Avoid having throw rugs at the top or bottom of stairways, or secure the rugs with carpet tape to prevent them from moving.  Choose a carpet design that does not hide the edge of steps on the stairway.  Check any carpeting to make sure that it is firmly attached to the stairs. Fix any carpet that is loose or worn. What can I do on the outside of my home?  Use bright outdoor lighting.  Regularly repair the edges of walkways and driveways and fix any cracks.  Remove high doorway thresholds.  Trim any shrubbery on the main path into your home.  Regularly check that handrails are securely fastened and in good repair. Both sides of any steps should have handrails.  Install guardrails along the edges of any raised decks or porches.  Clear walkways of debris and clutter, including tools and rocks.  Have leaves, snow, and ice cleared regularly.  Use sand or salt on walkways during winter months.  In the garage, clean up any spills right away, including grease or oil spills. What other actions can I take?  Wear closed-toe shoes that fit well and support your feet. Wear shoes that have rubber soles or low heels.  Use mobility aids as needed, such as canes, walkers, scooters, and crutches.  Review your medicines with your health care provider. Some medicines can cause dizziness or changes in blood pressure, which increase your risk of falling. Talk with your health care provider about other ways that you can decrease your risk of falls. This may include working with a physical therapist or trainer to improve your strength, balance, and endurance. Where to find more information  Centers for Disease Control and Prevention, STEADI: TVDivision.uy  [employer] on Aging: RingConnections.si Contact a health care provider if:  You are afraid of falling at home.  You feel weak, drowsy, or dizzy at home.  You fall at home. Summary  There are many  simple things that you can do to make your home safe and to help prevent falls.  Ways to make your home safe include removing tripping hazards and installing grab bars in the bathroom.  Ask for help when making these changes in your home. This information is not intended to replace advice given to you by  your health care provider. Make sure you discuss any questions you have with your health care provider. Document Revised: 11/22/2017 Document Reviewed: 07/25/2017 Elsevier Patient Education  2021 [place].  Patient verbalizes understanding of instructions provided today and agrees to view in [place].   Telephone follow up appointment with care management team member scheduled for: January 2023

## 2021-08-14 ENCOUNTER — Telehealth: Payer: Self-pay

## 2021-08-14 NOTE — Progress Notes (Signed)
Chronic Care Management Pharmacy Assistant   Name: Gail Lester  MRN: [id number] DOB: December 29, 1940   Reason for Encounter: Disease State Diabetes    Recent office visits:  None noted   Recent consult visits:  None noted   Hospital visits:  None in previous 6 months  Medications: Outpatient Encounter Medications as of 08/14/2021  Medication Sig Note   ACCU-CHEK AVIVA PLUS test strip CHECK BLOOD SUGAR 4 TIMES  DAILY AND AS NEEDED FOR  LABILE BLOOD SUGARS    aspirin 81 MG tablet Take 81 mg by mouth daily.    Blood Glucose Monitoring Suppl (ACCU-CHEK AVIVA PLUS) W/DEVICE KIT Use as directed    CALCIUM PO Take 630 mg by mouth daily. Taking 2 tablets of calcium citrate 315 each - 697m total    Cholecalciferol (VITAMIN D3) 50 MCG (2000 UT) TABS Take 1 tablet by mouth daily.    COVID-19 mRNA Vac-TriS, Pfizer, (PFIZER-BIONT COVID-19 VAC-TRIS) SUSP injection Inject into the muscle.    glimepiride (AMARYL) 4 MG tablet TAKE 1 TABLET BY MOUTH  TWICE DAILY    Krill Oil 500 MG CAPS Take 1 capsule by mouth daily.    losartan (COZAAR) 25 MG tablet TAKE 1 TABLET BY MOUTH  DAILY    LUTEIN PO Take 25 mg by mouth daily. 11/08/2020: For eye health, recommended by ophthalmologist   metFORMIN (GLUCOPHAGE-XR) 500 MG 24 hr tablet TAKE 1 TABLET BY MOUTH  DAILY    Multiple Vitamin (MULTIVITAMIN) tablet Take 1 tablet by mouth daily.     neomycin-polymyxin-hydrocortisone (CORTISPORIN) OTIC solution Place 3 drops into the right ear 3 (three) times daily.    rosuvastatin (CRESTOR) 10 MG tablet Take 1 tablet (10 mg total) by mouth daily.    tiZANidine (ZANAFLEX) 2 MG tablet Take 0.5-2 tablets (1-4 mg total) by mouth every 6 (six) hours as needed for muscle spasms.    No facility-administered encounter medications on file as of 08/14/2021.   Recent Relevant Labs: Lab Results  Component Value Date/Time   HGBA1C 7.5 (H) 04/10/2021 02:12 PM   HGBA1C 7.6 (H) 10/06/2020 02:23 PM   MICROALBUR 1.6 11/03/2015  08:32 AM   MICROALBUR 1.4 08/01/2015 11:37 AM    Kidney Function Lab Results  Component Value Date/Time   CREATININE 0.70 04/10/2021 02:12 PM   CREATININE 0.62 10/06/2020 02:23 PM   CREATININE 0.69 04/05/2020 03:09 PM   CREATININE 0.69 04/25/2018 03:44 PM   GFR 82.05 04/10/2021 02:12 PM   GFRNONAA >90 12/08/2011 07:24 PM   GFRAA >90 12/08/2011 07:24 PM    Current antihyperglycemic regimen:  Glimepiride 455mtwice daily with morning and evening meal  Metformin XR 5001maily What recent interventions/DTPs have been made to improve glycemic control:  N/a Have there been any recent hospitalizations or ED visits since last visit with CPP? No Patient denies hypoglycemic symptoms, including None Patient denies hyperglycemic symptoms, including none How often are you checking your blood sugar? once daily What are your blood sugars ranging?  Fasting: N/a Before meals: N/a After meals: N/a Bedtime: N/a  During the week, how often does your blood glucose drop below 70? Never Are you checking your feet daily/regularly? N/a  Misc. Comment: Patient states that she is doing well, Per patient she does not have a log of her recent blood sugars, but does state that her blood sugars have been in normal range. Patient states she checks them daily every morning, and that she has not had any lows or highs lately. Patient  states that her diet consists of mostly vegetarian items, Patient also tries to eat about 3 meals per day.   Adherence Review: Is the patient currently on a STATIN medication? Yes Is the patient currently on ACE/ARB medication? Yes Does the patient have >5 day gap between last estimated fill dates?    Star Rating Drugs: Rosuvastatin 10 mg. Last filled 06/29/21 90 DS Losartan 25 mg Last filled 07/29/21 90 DS Glimepiride 4 mg Last filled 04/09/21 90 DS Metformin 500 mg Last filled 06/29/21 90 DS  Andee Poles, CMA

## 2021-08-18 ENCOUNTER — Telehealth: Payer: Self-pay | Admitting: Family Medicine

## 2021-08-18 ENCOUNTER — Ambulatory Visit: Payer: Medicare Other | Admitting: Pharmacist

## 2021-08-18 ENCOUNTER — Telehealth: Payer: Medicare Other

## 2021-08-18 DIAGNOSIS — E782 Mixed hyperlipidemia: Secondary | ICD-10-CM

## 2021-08-18 DIAGNOSIS — E119 Type 2 diabetes mellitus without complications: Secondary | ICD-10-CM

## 2021-08-18 NOTE — Telephone Encounter (Signed)
Noted refill history shows patient has not filled glimepiride '4mg'$  since 03/2021.  Called [facility] and verified that last refill was indeed 04/09/2021 for 90 DS or #180 tablets. [facility] also mentioned that patient does not have active Rx for glimepiride. Will need new Rx. I tried to call patient to verify how she was taking glimepiride but unable to reach her. Will continue to try to call her next week.  Will try to verify dose prior to sending in new RX.

## 2021-08-18 NOTE — Patient Instructions (Signed)
Visit Information  PATIENT GOALS:  Goals Addressed             This Visit's Progress    Chronic Care Management Pharmacy Care Plan       CARE PLAN ENTRY (see longitudinal plan of care for additional care plan information)  Current Barriers:  Chronic Disease Management support, education, and care coordination needs related to Hyperlipidemia, Diabetes, Osteopenia   Hypertension Screening BP Readings from Last 3 Encounters:  04/10/21 122/60  04/06/21 122/60  11/08/20 (!) 122/58  Pharmacist Clinical Goal(s): Over the next 180 days, patient will work with PharmD and providers to maintain BP goal <140/90 Current regimen:  Losartan '25mg'$  daily Interventions: Discussed blood pressure goals Patient self care activities - Over the next 180 days, patient will: Maintain hypertension medication regimen.  Check blood pressure if you feel "swimmy headed"/ dizziness  Hyperlipidemia Lipid Panel     Component Value Date/Time   CHOL 145 04/10/2021 1412   TRIG 236.0 (H) 04/10/2021 1412   HDL 50.10 04/10/2021 1412   CHOLHDL 3 04/10/2021 1412   VLDL 47.2 (H) 04/10/2021 1412   LDLDIRECT 83.0 04/10/2021 1412  Pharmacist Clinical Goal(s): Over the next 180 days, patient will work with PharmD and providers to maintain LDL goal < 100 and attain Triglycerides <150 Current regimen:  Rosuvastatin '10mg'$  - take 1 tablet daily (helps to lower LDL / "bad cholesterol") Krill Oil '500mg'$  - take 1 tablet daily (helps to lower triglycerides) Interventions: Discussed recent lipid results and goals Dicussed diet and exercise Patient self care activities - Over the next 180 days, patient will: Maintain cholesterol medication regimen.  Continue to follow diet rich in vegetables and low in saturated and trans fat (limiting animal fat / products and processed foods)   Diabetes Lab Results  Component Value Date/Time   HGBA1C 7.5 (H) 04/10/2021 02:12 PM   HGBA1C 7.6 (H) 10/06/2020 02:23 PM  Pharmacist  Clinical Goal(s): Over the next 180 days, patient will work with PharmD and providers to achieve A1c goal  7.5% Current regimen:  Glimepiride '4mg'$  twice daily with morning and evening meal  Metformin XR '500mg'$  daily Interventions: Discussed goals for diabetes Monitor for low blood glucose - less than 70. Reviewed how to identify and treat hypoglycemia Patient self care activities - Over the next 180 days, patient will: Check blood sugar once daily, document, and provide at future appointments Contact provider with increased frequency of hypoglycemia (more than once per week) Continue current regimen for diabetes. Consider using weekly pill container to help with remembering to take medications  Osteopenia Pharmacist Clinical Goal(s) Over the next 180 days, patient will work with PharmD and providers to reduce risk of fracture due to osteopenia Current regimen:  Calcium citrate '315mg'$  - take 2 tablets - '630mg'$  daily Vitamin D 2000 units daily Interventions: Discussed intake of '1200mg'$  of calcium daily through diet and/or supplementation Discussed intake of [phone] units of vitamin D through supplementation  Consider repeat DEXA Scan Patient self care activities - Over the next 180 days, patient will: Continue to take current calcium and vitamin D supplements Continue to walk daily - helps to keep bones health Practice fall prevention techniques Consider repeat DEXA Scan  Pain in sacral area (history of sacral fracture):  Pharmacist Clinical Goal(s) Over the next 180 days, patient will work with PharmD and providers to reduce pain in sacral area Current regimen:  none Interventions: Dr Frederik Pear last notes mentioned trial of either Voltaren / diclofenac gel - apply to area  of pain as needed up to 4 times a day or lidocaine gel / patch - apply to area of pain daily for 12 hours then remove patch for 12 hours. Discussed pros and cons of both these options.  Fall assessment  completed Patient self care activities - Over the next 180 days, patient will: Purchase Voltaren / diclofenac gel over the counter - apply up to 4 times a day to area of pain on back / bottom as needed.  Practice fall prevention techniques  Medication management Pharmacist Clinical Goal(s): Over the next 180 days, patient will work with PharmD and providers to achieve optimal medication adherence Current pharmacy: [facility] Interventions Comprehensive medication review performed. Discussed medication adherence strategies.  Patient self care activities - Over the next 180 days, patient will: Focus on medication adherence by filling and taking medications appropriately  Take medications as prescribed Report any questions or concerns to PharmD and/or provider(s)  Please see past updates related to this goal by clicking on the "Past Updates" button in the selected goal          The patient verbalized understanding of instructions, educational materials, and care plan provided today and declined offer to receive copy of patient instructions, educational materials, and care plan.    [name], PharmD Clinical Pharmacist [facility]

## 2021-08-18 NOTE — Chronic Care Management (AMB) (Signed)
Chronic Care Management Pharmacy Note  08/18/2021 Name:  Gail Lester MRN:  [id number] DOB:  Jul 24, 1941  Subjective: Gail Lester is an 80 y.o. year old female who is a primary patient of Mosie Lukes, MD.  The CCM team was consulted for assistance with disease management and care coordination needs.    Engaged with patient by telephone for  to discuss medication adherence  in response to provider referral for pharmacy case management and/or care coordination services.   Consent to Services:  The patient was given information about Chronic Care Management services, agreed to services, and gave verbal consent prior to initiation of services.  Please see initial visit note for detailed documentation.   Patient Care Team: Mosie Lukes, MD as PCP - General (Family Medicine) Sharyne Peach, MD as Consulting Physician (Ophthalmology) Martinique, Amy, MD as Consulting Physician (Dermatology) [name], PharmD (Pharmacist)  Recent office visits: 04/10/2021 - PCP (Dr Charlett Blake) - f/u chronic conditions; changed rosuvastatin from 48m 1/2 tablet daily to 181mdaily - new Rx sent in; Prescribed Cortisporin Otic Soln - 3 drops in the right eat tid; Osteopenia - Encouraged to get adequate exercise, calcium and vitamin d intake. Sacral pain - Notes persistent pain since a fall caused a sacral fracture 2 years ago. Likely arthritic, try Voltaren gel and lidocaine gel.  Recent consult visits: 11/28/20 Neurology MeHazle CocaPHD. Mild Neurocognitive. No medications started / changed  Hospital visits: None in previous 6 months  Objective:  Lab Results  Component Value Date   CREATININE 0.70 04/10/2021   CREATININE 0.62 10/06/2020   CREATININE 0.69 04/05/2020    Lab Results  Component Value Date   HGBA1C 7.5 (H) 04/10/2021   Last diabetic Eye exam:  Lab Results  Component Value Date/Time   HMDIABEYEEXA No Retinopathy 04/25/2020 12:00 AM    Last diabetic Foot exam:  Lab Results   Component Value Date/Time   HMDIABFOOTEX done; normal 08/24/2012 12:00 AM        Component Value Date/Time   CHOL 145 04/10/2021 1412   TRIG 236.0 (H) 04/10/2021 1412   HDL 50.10 04/10/2021 1412   CHOLHDL 3 04/10/2021 1412   VLDL 47.2 (H) 04/10/2021 1412   LDLCALC 71 10/06/2020 1423   LDLDIRECT 83.0 04/10/2021 1412    Hepatic Function Latest Ref Rng & Units 04/10/2021 10/06/2020 04/05/2020  Total Protein 6.0 - 8.3 g/dL 6.8 6.6 6.8  Albumin 3.5 - 5.2 g/dL 3.9 - 4.3  AST 0 - 37 U/L '23 24 21  ' ALT 0 - 35 U/L '18 17 18  ' Alk Phosphatase 39 - 117 U/L 53 - 51  Total Bilirubin 0.2 - 1.2 mg/dL 0.4 0.5 0.4  Bilirubin, Direct 0.0 - 0.3 mg/dL - - -    Lab Results  Component Value Date/Time   TSH 2.92 04/10/2021 02:12 PM   TSH 1.55 10/06/2020 02:23 PM    CBC Latest Ref Rng & Units 04/10/2021 10/06/2020 04/05/2020  WBC 4.0 - 10.5 K/uL 7.3 6.8 8.1  Hemoglobin 12.0 - 15.0 g/dL 12.9 12.9 13.5  Hematocrit 36.0 - 46.0 % 39.0 38.1 39.9  Platelets 150.0 - 400.0 K/uL 303.0 275 285.0    Lab Results  Component Value Date/Time   VD25OH 58.47 11/03/2015 08:32 AM   VD25OH 79 11/24/2012 10:38 AM    Clinical ASCVD: No  The ASCVD Risk score (GMikey BussingC Jr., et al., 2013) failed to calculate for the following reasons:   The 2013 ASCVD risk score is only valid  for ages 70 to 44     Social History   Tobacco Use  Smoking Status Former   Types: Cigarettes   Quit date: 11/17/1989   Years since quitting: 31.7  Smokeless Tobacco Never   BP Readings from Last 3 Encounters:  04/10/21 122/60  04/06/21 122/60  11/08/20 (!) 122/58   Pulse Readings from Last 3 Encounters:  04/10/21 80  04/06/21 72  11/08/20 70   Wt Readings from Last 3 Encounters:  04/10/21 153 lb 3.2 oz (69.5 kg)  04/06/21 153 lb 6.4 oz (69.6 kg)  11/08/20 157 lb 3.2 oz (71.3 kg)    Assessment: Review of patient past medical history, allergies, medications, health status, including review of consultants reports, laboratory  and other test data, was performed as part of comprehensive evaluation and provision of chronic care management services.   SDOH:  (Social Determinants of Health) assessments and interventions performed:     CCM Care Plan  Allergies  Allergen Reactions   Ramipril Diarrhea   Lipitor [Atorvastatin]     Diarrhea, muscle cramps   Lisinopril     myalgias    Medications Reviewed Today     Reviewed by [name], PharmD (Pharmacist) on 08/18/21 at [place] List Status: <None>   Medication Order Taking? Sig Documenting Provider Last Dose Status Informant  ACCU-CHEK AVIVA PLUS test strip [id number] Yes CHECK BLOOD SUGAR 4 TIMES  DAILY AND AS NEEDED FOR  LABILE BLOOD SUGARS Mosie Lukes, MD Taking Active   aspirin 81 MG tablet [id number] Yes Take 81 mg by mouth daily. [provider] Taking Active   Blood Glucose Monitoring Suppl (ACCU-CHEK AVIVA PLUS) W/DEVICE KIT [id number] Yes Use as directed Mosie Lukes, MD Taking Active   CALCIUM PO [id number] Yes Take 630 mg by mouth daily. Taking 2 tablets of calcium citrate 315 each - 641m total [provider] Taking Active   Cholecalciferol (VITAMIN D3) 50 MCG (2000 UT) TABS [id number] Take 1 tablet by mouth daily. [provider] Taking Active   COVID-19 mRNA Vac-TriS, Pfizer, (PFIZER-BIONT COVID-19 VAC-TRIS) SUSP injection [id number] Inject into the muscle. SCarlyle Basques MD Taking Active   glimepiride (AMARYL) 4 MG tablet [id number] TAKE 1 TABLET BY MOUTH  TWICE DAILY BMosie Lukes MD Taking Active   Krill Oil 500 MG CAPS [id number] Take 1 capsule by mouth daily. [provider] Taking Active   losartan (COZAAR) 25 MG tablet [id number] TAKE 1 TABLET BY MOUTH  DAILY BMosie Lukes MD Taking Active   LUTEIN PO [id number] Take 25 mg by mouth daily. [provider] Taking Active            Med Note (Norva PavlovNov 16, 2021  7:48 PM) For eye health, recommended by  ophthalmologist  metFORMIN (GLUCOPHAGE-XR) 500 MG 24 hr tablet [id number] TAKE 1 TABLET BY MOUTH  DAILY BMosie Lukes MD Taking Active   Multiple Vitamin (MULTIVITAMIN) tablet [id number] Take 1 tablet by mouth daily.  [provider] Taking Active   neomycin-polymyxin-hydrocortisone (CORTISPORIN) OTIC solution [id number] Place 3 drops into the right ear 3 (three) times daily. BMosie Lukes MD Taking Active   rosuvastatin (CRESTOR) 10 MG tablet [id number] Take 1 tablet (10 mg total) by mouth daily. BMosie Lukes MD Taking Active   tiZANidine (ZANAFLEX) 2 MG tablet [id number] Take 0.5-2 tablets (1-4 mg total) by mouth every 6 (six) hours as needed for  muscle spasms. Mosie Lukes, MD Taking Active   Med List Note Sheldon Silvan, CPhT 12/08/11 1422): Patient uses Engineer, [employer].             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 08/14/2015   Rotator cuff arthropathy 11/24/2014   Dysplastic nevus 10/11/2014   Seborrheic keratosis 09/28/2014   Melanocytic nevi, unspecified 09/28/2014   Hemangioma 09/28/2014   Overweight 06/14/2014   Diarrhea 02/10/2013   Type 2 diabetes mellitus ([place]) 02/10/2013   Hyperlipidemia, mixed 02/10/2013   Fibrocystic breast 02/10/2013   Dupuytren's contracture of both hands 02/10/2013    Immunization History  Administered Date(s) Administered   Fluad Quad(high Dose 65+) 08/28/2019, 10/04/2020   Influenza, High Dose Seasonal PF 09/11/2016, 10/03/2017, 08/26/2018   Influenza,inj,Quad PF,6+ Mos 09/02/2013,  08/31/2014, 09/21/2015   PFIZER Comirnaty(Gray Top)Covid-19 Tri-Sucrose Vaccine 04/06/2021   PFIZER(Purple Top)SARS-COV-2 Vaccination 01/28/2020, 02/22/2020, 09/18/2020   Pneumococcal Conjugate-13 02/04/2014   Pneumococcal Polysaccharide-23 01/12/2007   Tdap 01/13/2008, 10/06/2019   Zoster Recombinat (Shingrix) 08/05/2018, 10/01/2018   Zoster, Live 01/12/2007    Conditions to be addressed/monitored: HLD, DMII and osteopenia; pain in sacral area with h/o fracure; mild congnitive impairment  Care Plan : General Pharmacy (Adult)  Updates made by [name], PHARMD since 08/18/2021 12:00 AM     Problem: Medication management and chronic disease management support, education and care coordination need related to HDL, T2DM, osteopenia, sacral pain   Note:   Current Barriers:  Unable to achieve control of elevated triglycerides and pain in sacral area  Chronic Disease Management support, education, and care coordination needs related to Hyperlipidemia, sacral pain; Diabetes, Osteopenia  Pharmacist Clinical Goal(s):  Over the next 180 days, patient will achieve adherence to monitoring guidelines and medication adherence to achieve therapeutic efficacy achieve control of Triglycerides and pain as evidenced by Tg <150 and reporting improved sacral pain maintain control of diabetes and LDL as evidenced by A1c 7.5% or less and LDL <100  through collaboration with PharmD and provider.   Interventions: 1:1 collaboration with Mosie Lukes, MD regarding development and update of comprehensive plan of care as evidenced by provider attestation and co-signature Inter-disciplinary care team collaboration (see longitudinal plan of care) Comprehensive medication review performed; medication list updated in electronic medical record  Hypertension Screening Controlled; BP goal <140/90 Current regimen:  Losartan 39m daily Interventions: Discussed blood pressure goals Maintain hypertension  medication regimen.  Check blood pressure if you feel "swimmy headed"/ dizziness  Hyperlipidemia LDL at goal of <100; Tg not at goal of <150 Current regimen:  Rosuvastatin 155m- take 1 tablet daily (helps to lower LDL / "bad cholesterol") Krill Oil 50075m take 1 tablet daily (helps to lower triglycerides) Interventions: Discussed recent lipid results and goals Dicussed diet and exercise Discussed possibly increasing Krill Oil to bid but patient states tablet if very large and prefers to only take one per day Recommended maintain cholesterol medication regimen.  Continue to follow diet rich in vegetables and low in saturated and trans fat (limiting animal fat / products and processed foods)  Diabetes Controlled and has achieved A1c goal 7.5% Current regimen:  Glimepiride 10m twice daily with morning and evening meal  Metformin XR 5042mdaily Current glucose readings: 100-170 Denies hypoglycemia Refill records show that patient last filled glimepiride 50m32m180 = 90 DS on 04/09/2021. Discussed with patient and she states she is taking twice a day but has a full bottle remaining. Current meal patterns: reports that meal schedule is variable and sometimes eats small meal in evening.  Eating mostly vegetables Interventions: Discussed goals for diabetes Monitor for low blood glucose - less than 70. Reviewed how to identify and treat hypoglycemia. If frequency of hypoglycemia increases could consider lowering glimepiride dose to 50mg100mth morning meal and 2mg 72m5 tablet) with evening meal.  Check blood sugar once daily, document, and provide at future appointments Discussed important of taking glimepiride twice a day. Recommended placing bottle in area that she will see everyday or use pill container.   Osteopenia History of sacral fracture in 2020 Current regimen:  Calcium citrate 315mg 71mke 2 tablets - 630mg d1m Vitamin D 2000 units daily Last DEXA 11/27/2018 Forearm Radius 33%  T-score =  -2.0. (was -1.6 on 11/13/2016) AP Spine L1-L2 T-Score = 1.8 (was 1.4 on 11/13/2016)  Dual Femur Total T-Score =  (was 0.3 on 11/13/2016) FRAX* 10-year Probability of Fracture Based on femoral neck BMD: DualFemur (Right) Major Osteoporotic Fracture: 17.6% Hip Fracture:                3.3% Interventions:  Consider repeat DEXA Scan Continue to take current calcium and vitamin D supplements Continue to walk daily Discussed fall prevention  Pain in sacral area (history of sacral fracture):  Pharmacist Clinical Goal(s) Over the next 180 days, patient will work with PharmD and providers to reduce pain in sacral area Current regimen:  none Interventions: Dr Blyth'sFrederik Pearotes mentioned trial of either Voltaren / diclofenac gel - apply to area of pain as needed up to 4 times a day or lidocaine gel / patch - apply to area of pain daily for 12 hours then remove patch for 12 hours. Discussed pros and cons of both these options.  Fall assessment completed Patient self care activities - Over the next 180 days, patient will: Purchase Voltaren / diclofenac gel over the counter - apply up to 4 times a day to area of pain on back / bottom as needed.  Practice fall prevention techniques  Medication management Pharmacist Clinical Goal(s): Over the next 180 days, patient will work with PharmD and providers to achieve optimal medication adherence Current pharmacy: [facility] Interventions Comprehensive medication review performed. Continue current medication management strategy Reviewed refill history Discussed medication adherence strategies, especially for glimepiride.  Patient self care activities - Over the next 180 days, patient will: Focus on medication adherence by filling and taking medications appropriately  Take medications as prescribed Report any questions or concerns to PharmD and/or provider(s)  Patient Goals/Self-Care Activities Over the next 180 days, patient  will:  take medications as prescribed, check glucose daily, document, and provide at future appointments, target a minimum of 150 minutes of moderate intensity exercise weekly, engage in dietary modifications by limiting intake of saturated and trans fat, and sugar / simple carbohydrates.   Follow Up Plan: Telephone follow up appointment with care management team member scheduled for:  6 months with clinical pharmacist; Plant to follow adherence - recheck in 4 weeks.     H     Medication Assistance:  Patient mentions that generic Cortisporin Ear Drops were more expensive that she was expecting at $47. Reviewed her formulary and they are listed as tier 3 which is $47 copay. Reviewed GoodRx price and is more than her coapy at $15. Patient Snover with cost for now.   - review at 05/2021 visit  Patient's preferred pharmacy is:  [facility]  ([place]) - [place], [place] [facility] [address] [place] [address] [place] [address] Phone: [phone] Fax: [place] [address] [place] [address] Phone: [phone] Fax: [phone]   Follow Up:  Patient agrees to Care Plan and Follow-up.  Plan: Telephone follow up appointment with care management team member scheduled for:  6 months   Pharmacy team assistant will outreach patient to check adherence to glimepiride in 2 to 4 weeks.   [name], PharmD Clinical Pharmacist [facility]

## 2021-08-21 ENCOUNTER — Telehealth: Payer: Medicare Other

## 2021-08-22 DIAGNOSIS — Z961 Presence of intraocular lens: Secondary | ICD-10-CM | POA: Diagnosis not present

## 2021-08-22 DIAGNOSIS — H43813 Vitreous degeneration, bilateral: Secondary | ICD-10-CM | POA: Diagnosis not present

## 2021-08-22 DIAGNOSIS — E119 Type 2 diabetes mellitus without complications: Secondary | ICD-10-CM | POA: Diagnosis not present

## 2021-08-22 LAB — HM DIABETES EYE EXAM

## 2021-09-11 ENCOUNTER — Telehealth: Payer: Self-pay | Admitting: Pharmacist

## 2021-09-11 NOTE — Chronic Care Management (AMB) (Signed)
    Chronic Care Management Pharmacy Assistant   Name: Gail Lester  MRN: [id number] DOB: 08-29-1941  Reason for Encounter: Disease State General   Recent office visits:  None noted  Recent consult visits:  None noted  Hospital visits:  None in previous 6 months  Medications: Outpatient Encounter Medications as of 09/11/2021  Medication Sig Note   ACCU-CHEK AVIVA PLUS test strip CHECK BLOOD SUGAR 4 TIMES  DAILY AND AS NEEDED FOR  LABILE BLOOD SUGARS    aspirin 81 MG tablet Take 81 mg by mouth daily.    Blood Glucose Monitoring Suppl (ACCU-CHEK AVIVA PLUS) W/DEVICE KIT Use as directed    CALCIUM PO Take 630 mg by mouth daily. Taking 2 tablets of calcium citrate 315 each - 675m total    Cholecalciferol (VITAMIN D3) 50 MCG (2000 UT) TABS Take 1 tablet by mouth daily.    COVID-19 mRNA Vac-TriS, Pfizer, (PFIZER-BIONT COVID-19 VAC-TRIS) SUSP injection Inject into the muscle.    glimepiride (AMARYL) 4 MG tablet TAKE 1 TABLET BY MOUTH  TWICE DAILY    Krill Oil 500 MG CAPS Take 1 capsule by mouth daily.    losartan (COZAAR) 25 MG tablet TAKE 1 TABLET BY MOUTH  DAILY    LUTEIN PO Take 25 mg by mouth daily. 11/08/2020: For eye health, recommended by ophthalmologist   metFORMIN (GLUCOPHAGE-XR) 500 MG 24 hr tablet TAKE 1 TABLET BY MOUTH  DAILY    Multiple Vitamin (MULTIVITAMIN) tablet Take 1 tablet by mouth daily.     neomycin-polymyxin-hydrocortisone (CORTISPORIN) OTIC solution Place 3 drops into the right ear 3 (three) times daily.    rosuvastatin (CRESTOR) 10 MG tablet Take 1 tablet (10 mg total) by mouth daily.    tiZANidine (ZANAFLEX) 2 MG tablet Take 0.5-2 tablets (1-4 mg total) by mouth every 6 (six) hours as needed for muscle spasms.    No facility-administered encounter medications on file as of 09/11/2021.   Have you had any problems recently with your health? Patient states she has no problems with her health.  Have you had any problems with your pharmacy? Patient states she  has no problems with her pharmacy.  What issues or side effects are you having with your medications? Patient states she has no issues or side effects to her medications.  What would you like me to pass along to TSamakfor them to help you with?  Patient states she has enough medication of the Glimepiride she states she has 2 months worth of this medication. She also states she does forget to take her glimepiride sometimes. I advised patient to make sure she takes her medication as prescribed.  What can we do to take care of you better? Patient states she has enough medication of the Glimepiride she states she has 2 months worth of this medication. She also states she does forget to take her glimepiride sometimes. I advised patient to make sure she takes her medication as prescribed.   Star Rating Drugs: Rosuvastatin 10 mg Last filled:06/29/21 90 DS Losartan 25 mg Last filled:07/29/21 90 DS Glimepiride 4 mg Last filled:04/09/21 90 DS   [name]

## 2021-09-14 ENCOUNTER — Encounter: Payer: Self-pay | Admitting: Family Medicine

## 2021-09-14 ENCOUNTER — Ambulatory Visit (INDEPENDENT_AMBULATORY_CARE_PROVIDER_SITE_OTHER): Payer: Medicare Other

## 2021-09-14 ENCOUNTER — Other Ambulatory Visit: Payer: Self-pay

## 2021-09-14 DIAGNOSIS — Z23 Encounter for immunization: Secondary | ICD-10-CM

## 2021-09-15 ENCOUNTER — Ambulatory Visit: Payer: Medicare Other | Attending: Internal Medicine

## 2021-09-15 DIAGNOSIS — Z23 Encounter for immunization: Secondary | ICD-10-CM

## 2021-09-15 NOTE — Progress Notes (Signed)
   Covid-19 Vaccination Clinic  Name:  Gail Lester    MRN: [id number] DOB: 03/17/1941  09/15/2021  [name] was observed post Covid-19 immunization for 15 minutes without incident. She was provided with Vaccine Information Sheet and instruction to access the V-Safe system.   [name] was instructed to call 911 with any severe reactions post vaccine: Difficulty breathing  Swelling of face and throat  A fast heartbeat  A bad rash all over body  Dizziness and weakness

## 2021-09-22 ENCOUNTER — Other Ambulatory Visit: Payer: Self-pay | Admitting: Family Medicine

## 2021-09-22 ENCOUNTER — Other Ambulatory Visit (HOSPITAL_BASED_OUTPATIENT_CLINIC_OR_DEPARTMENT_OTHER): Payer: Self-pay

## 2021-09-22 MED ORDER — COVID-19MRNA BIVAL VACC PFIZER 30 MCG/0.3ML IM SUSP
INTRAMUSCULAR | 0 refills | Status: DC
  Filled 2021-09-22: qty 0.3, 1d supply, fill #0

## 2021-09-29 ENCOUNTER — Ambulatory Visit (INDEPENDENT_AMBULATORY_CARE_PROVIDER_SITE_OTHER): Payer: Medicare Other | Admitting: Pharmacist

## 2021-09-29 DIAGNOSIS — E119 Type 2 diabetes mellitus without complications: Secondary | ICD-10-CM

## 2021-09-29 DIAGNOSIS — E782 Mixed hyperlipidemia: Secondary | ICD-10-CM

## 2021-09-29 NOTE — Patient Instructions (Signed)
Visit Information  PATIENT GOALS:  Goals Addressed             This Visit's Progress    Chronic Care Management Pharmacy Care Plan       CARE PLAN ENTRY (see longitudinal plan of care for additional care plan information)  Current Barriers:  Chronic Disease Management support, education, and care coordination needs related to Hyperlipidemia, Diabetes, Osteopenia   Hypertension Screening BP Readings from Last 3 Encounters:  04/10/21 122/60  04/06/21 122/60  11/08/20 (!) 122/58  Pharmacist Clinical Goal(s): Over the next 180 days, patient will work with PharmD and providers to maintain BP goal <140/90 Current regimen:  Losartan 25mg  daily Interventions: Discussed blood pressure goals Patient self care activities - Over the next 180 days, patient will: Maintain hypertension medication regimen.  Check blood pressure if you feel "swimmy headed"/ dizziness  Hyperlipidemia Lipid Panel     Component Value Date/Time   CHOL 145 04/10/2021 1412   TRIG 236.0 (H) 04/10/2021 1412   HDL 50.10 04/10/2021 1412   CHOLHDL 3 04/10/2021 1412   VLDL 47.2 (H) 04/10/2021 1412   LDLDIRECT 83.0 04/10/2021 1412  Pharmacist Clinical Goal(s): Over the next 180 days, patient will work with PharmD and providers to maintain LDL goal < 100 and attain Triglycerides <150 Current regimen:  Rosuvastatin 10mg  - take 1 tablet daily (helps to lower LDL / "bad cholesterol") Krill Oil 500mg  - take 1 tablet daily (helps to lower triglycerides) Interventions: Discussed recent lipid results and goals Dicussed diet and exercise Patient self care activities - Over the next 180 days, patient will: Maintain cholesterol medication regimen.  Continue to follow diet rich in vegetables and low in saturated and trans fat (limiting animal fat / products and processed foods)   Diabetes Lab Results  Component Value Date/Time   HGBA1C 7.5 (H) 04/10/2021 02:12 PM   HGBA1C 7.6 (H) 10/06/2020 02:23 PM  Pharmacist  Clinical Goal(s): Over the next 180 days, patient will work with PharmD and providers to achieve A1c goal  7.5% Current regimen:  Glimepiride 4mg  twice daily with morning and evening meal  Metformin XR 500mg  daily Interventions: Discussed goals for diabetes Monitor for low blood glucose - less than 70. Reviewed how to identify and treat hypoglycemia Patient self care activities - Over the next 180 days, patient will: Check blood sugar once daily, document, and provide at future appointments Contact provider with increased frequency of hypoglycemia (more than once per week) Continue current regimen for diabetes. Consider using weekly pill container to help with remembering to take medications Lower dose of glimepride - 4mg  with breakfast but only 2mg  = 0.5 tablet with evening meal to see if evening hypoglycemia and lows in morning improved.   Osteopenia Pharmacist Clinical Goal(s) Over the next 180 days, patient will work with PharmD and providers to reduce risk of fracture due to osteopenia Current regimen:  Calcium citrate 315mg  - take 2 tablets - 630mg  daily Vitamin D 2000 units daily Interventions: Discussed intake of 1200mg  of calcium daily through diet and/or supplementation Discussed intake of [phone] units of vitamin D through supplementation  Consider repeat DEXA Scan Patient self care activities - Over the next 180 days, patient will: Continue to take current calcium and vitamin D supplements Continue to walk daily - helps to keep bones health Practice fall prevention techniques Consider repeat DEXA Scan  Medication management Pharmacist Clinical Goal(s): Over the next 180 days, patient will work with PharmD and providers to achieve optimal medication adherence Current  pharmacy: [facility] Interventions Comprehensive medication review performed. Discussed medication adherence strategies.  Patient self care activities - Over the next 180 days, patient  will: Focus on medication adherence by filling and taking medications appropriately  Take medications as prescribed Report any questions or concerns to PharmD and/or provider(s)  Please see past updates related to this goal by clicking on the "Past Updates" button in the selected goal          Patient verbalizes understanding of instructions provided today and agrees to view in [place].   Telephone follow up appointment with care management team member scheduled for: 2 weeks phone follow up with Chronic Care Management pharmacy team; December PCP for physical and January f/u with clinical pharmacist.   [name], PharmD Clinical Pharmacist [place] [facility]

## 2021-09-29 NOTE — Chronic Care Management (AMB) (Signed)
Chronic Care Management Pharmacy Note  09/29/2021 Name:  Gail Lester MRN:  [id number] DOB:  [birth year]  Summary:  Patient reported few episodes at night of feeling lightheaded and felt that blood glucose was low. Also had 3 recent blood glucose readings in morning that were <70.   Recommendation:  Decrease PM dose of glimepiride to 1/2 tablet or 15m  - continue to take 475mwith morning meal. Reviewed how to recognize and treat hypoglycemia (<70). Continue to check blood glucose daily.   Plan: F/U in 2 weeks to review home blood glucose and assess for hypoglycemia  Subjective: Gail COAKLEYs an [age].o. year old female who is a primary patient of Gail Lester.  The CCM team was consulted for assistance with disease management and care coordination needs.    Engaged with patient by telephone for  to discuss medication adherence  in response to provider referral for pharmacy case management and/or care coordination services.   Consent to Services:  The patient was given information about Chronic Care Management services, agreed to services, and gave verbal consent prior to initiation of services.  Please see initial visit note for detailed documentation.   Patient Care Team: Gail Lester as PCP - General (Family Medicine) GoSharyne Lester as Consulting Physician (Ophthalmology) JoMartiniqueAmy, MD as Consulting Physician (Dermatology) Gail Lester (Pharmacist)  Recent office visits: 04/10/2021 - PCP (Dr Gail Lester- f/u chronic conditions; changed rosuvastatin from 2029m/2 tablet daily to 62m36mily - new Rx sent in; Prescribed Cortisporin Otic Soln - 3 drops in the right eat tid; Osteopenia - Encouraged to get adequate exercise, calcium and vitamin d intake. Sacral pain - Notes persistent pain since a fall caused a sacral fracture 2 years ago. Likely arthritic, try Voltaren gel and lidocaine gel.  Recent consult visits: 11/28/20 Neurology MerzHazle CocaD.  Mild Neurocognitive. No medications started / changed  Hospital visits: None in previous 6 months  Objective:  Lab Results  Component Value Date   CREATININE 0.70 04/10/2021   CREATININE 0.62 10/06/2020   CREATININE 0.69 04/05/2020    Lab Results  Component Value Date   HGBA1C 7.5 (H) 04/10/2021   Last diabetic Eye exam:  Lab Results  Component Value Date/Time   HMDIABEYEEXA No Retinopathy 08/22/2021 12:00 AM    Last diabetic Foot exam:  Lab Results  Component Value Date/Time   HMDIABFOOTEX done; normal 08/24/2012 12:00 AM        Component Value Date/Time   CHOL 145 04/10/2021 1412   TRIG 236.0 (H) 04/10/2021 1412   HDL 50.10 04/10/2021 1412   CHOLHDL 3 04/10/2021 1412   VLDL 47.2 (H) 04/10/2021 1412   LDLCALC 71 10/06/2020 1423   LDLDIRECT 83.0 04/10/2021 1412    Hepatic Function Latest Ref Rng & Units 04/10/2021 10/06/2020 04/05/2020  Total Protein 6.0 - 8.3 g/dL 6.8 6.6 6.8  Albumin 3.5 - 5.2 g/dL 3.9 - 4.3  AST 0 - 37 U/L '23 24 21  ' ALT 0 - 35 U/L '18 17 18  ' Alk Phosphatase 39 - 117 U/L 53 - 51  Total Bilirubin 0.2 - 1.2 mg/dL 0.4 0.5 0.4  Bilirubin, Direct 0.0 - 0.3 mg/dL - - -    Lab Results  Component Value Date/Time   TSH 2.92 04/10/2021 02:12 PM   TSH 1.55 10/06/2020 02:23 PM    CBC Latest Ref Rng & Units 04/10/2021 10/06/2020 04/05/2020  WBC 4.0 - 10.5 K/uL 7.3 6.8 8.1  Hemoglobin 12.0 - 15.0 g/dL 12.9 12.9 13.5  Hematocrit 36.0 - 46.0 % 39.0 38.1 39.9  Platelets 150.0 - 400.0 K/uL 303.0 275 285.0    Lab Results  Component Value Date/Time   VD25OH 58.47 11/03/2015 08:32 AM   VD25OH 79 11/24/2012 10:38 AM    Clinical ASCVD: No  The ASCVD Risk score (Arnett DK, et al., 2019) failed to calculate for the following reasons:   The 2019 ASCVD risk score is only valid for ages 30 to 88     Social History   Tobacco Use  Smoking Status Former   Types: Cigarettes   Quit date: 11/17/1989   Years since quitting: 31.8  Smokeless Tobacco Never    BP Readings from Last 3 Encounters:  04/10/21 122/60  04/06/21 122/60  11/08/20 (!) 122/58   Pulse Readings from Last 3 Encounters:  04/10/21 80  04/06/21 72  11/08/20 70   Wt Readings from Last 3 Encounters:  04/10/21 153 lb 3.2 oz (69.5 kg)  04/06/21 153 lb 6.4 oz (69.6 kg)  11/08/20 157 lb 3.2 oz (71.3 kg)    Assessment: Review of patient past medical history, allergies, medications, health status, including review of consultants reports, laboratory and other test data, was performed as part of comprehensive evaluation and provision of chronic care management services.   SDOH:  (Social Determinants of Health) assessments and interventions performed:  SDOH Interventions    Flowsheet Row Most Recent Value  SDOH Interventions   Financial Strain Interventions Intervention Not Indicated  Physical Activity Interventions Intervention Not Indicated        CCM Care Plan  Allergies  Allergen Reactions   Ramipril Diarrhea   Lipitor [Atorvastatin]     Diarrhea, muscle cramps   Lisinopril     myalgias    Medications Reviewed Today     Reviewed by [name], PharmD (Pharmacist) on 09/29/21 at 26  Med List Status: <None>   Medication Order Taking? Sig Documenting Provider Last Dose Status Informant  ACCU-CHEK AVIVA PLUS test strip [id number] Yes CHECK BLOOD SUGAR 4 TIMES  DAILY AND AS NEEDED FOR  LABILE BLOOD SUGARS Mosie Lukes, MD Taking Active   aspirin 81 MG tablet [id number] Yes Take 81 mg by mouth daily. [provider] Taking Active   Blood Glucose Monitoring Suppl (ACCU-CHEK AVIVA PLUS) W/DEVICE KIT [id number] Yes Use as directed Mosie Lukes, MD Taking Active   CALCIUM PO [id number] Yes Take 630 mg by mouth daily. Taking 2 tablets of calcium citrate 315 each - 650m total [provider] Taking Active   Cholecalciferol (VITAMIN D3) 50 MCG (2000 UT) TABS [id number] Take 1 tablet by mouth daily. [provider] Taking Active    COVID-19 mRNA bivalent vaccine, Pfizer, injection [id number] Inject into the muscle. SCarlyle Basques MD Taking Active   glimepiride (AMARYL) 4 MG tablet [id number] TAKE 1 TABLET BY MOUTH  TWICE DAILY BMosie Lukes MD Taking Active   Krill Oil 500 MG CAPS [id number] Take 1 capsule by mouth daily. [provider] Taking Active   losartan (COZAAR) 25 MG tablet [id number] TAKE 1 TABLET BY MOUTH  DAILY BMosie Lukes MD Taking Active   LUTEIN PO [id number] Take 25 mg by mouth daily. [provider] Taking Active            Med Note (Norva PavlovNov 16, 2021  7:48 PM) For eye health, recommended by ophthalmologist  metFORMIN (GLUCOPHAGE-XR) 500 MG  24 hr tablet [id number] Yes TAKE 1 TABLET BY MOUTH  DAILY Mosie Lukes, MD Taking Active   Multiple Vitamin (MULTIVITAMIN) tablet [id number] Yes Take 1 tablet by mouth daily.  [provider] Taking Active   rosuvastatin (CRESTOR) 10 MG tablet [id number] Yes Take 1 tablet (10 mg total) by mouth daily. Mosie Lukes, MD Taking Active   tiZANidine (ZANAFLEX) 2 MG tablet [id number] Yes Take 0.5-2 tablets (1-4 mg total) by mouth every 6 (six) hours as needed for muscle spasms. Mosie Lukes, MD Taking Active   Med List Note Sheldon Silvan, CPhT 12/08/11 1422): Patient uses Engineer, [employer].             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 08/14/2015   Rotator cuff arthropathy 11/24/2014   Dysplastic nevus 10/11/2014   Seborrheic keratosis 09/28/2014   Melanocytic nevi,  unspecified 09/28/2014   Hemangioma 09/28/2014   Overweight 06/14/2014   Diarrhea 02/10/2013   Type 2 diabetes mellitus ([place]) 02/10/2013   Hyperlipidemia, mixed 02/10/2013   Fibrocystic breast 02/10/2013   Dupuytren's contracture of both hands 02/10/2013    Immunization History  Administered Date(s) Administered   Fluad Quad(high Dose 65+) 08/28/2019, 10/04/2020, 09/14/2021   Influenza, High Dose Seasonal PF 09/11/2016, 10/03/2017, 08/26/2018   Influenza,inj,Quad PF,6+ Mos 09/02/2013, 08/31/2014, 09/21/2015   PFIZER Comirnaty(Gray Top)Covid-19 Tri-Sucrose Vaccine 04/06/2021   PFIZER(Purple Top)SARS-COV-2 Vaccination 01/28/2020, 02/22/2020, 09/18/2020   Pfizer Covid-19 Vaccine Bivalent Booster 49yr & up 09/15/2021   Pneumococcal Conjugate-13 02/04/2014   Pneumococcal Polysaccharide-23 01/12/2007   Tdap 01/13/2008, 10/06/2019   Zoster Recombinat (Shingrix) 08/05/2018, 10/01/2018   Zoster, Live 01/12/2007    Conditions to be addressed/monitored: HLD, DMII and osteopenia; pain in sacral area with h/o fracure; mild congnitive impairment  Care Plan : General Pharmacy (Adult)  Updates made by ECherre Robins PHARMD since 09/29/2021 12:00 AM     Problem: Medication management and chronic disease management support, education and care coordination need related to HDL, T2DM, osteopenia, sacral pain   Priority: Medium  Onset Date: 04/11/2021  Note:   Current Barriers:  Unable to achieve control of elevated triglycerides   Chronic Disease Management support, education, and care coordination needs related to Hyperlipidemia, sacral pain; Diabetes, Osteopenia  Pharmacist Clinical Goal(s):  Over the next 180 days, patient will achieve adherence to monitoring guidelines and medication adherence to achieve therapeutic efficacy achieve control of Triglycerides and pain as evidenced by Tg <150 and reporting improved sacral pain maintain control of diabetes and LDL as evidenced by A1c 7.5% or  less and LDL <100  through collaboration with PharmD and provider.   Interventions: 1:1 collaboration with BMosie Lukes MD regarding development and update of comprehensive plan of care as evidenced by provider attestation and co-signature Inter-disciplinary care team collaboration (see longitudinal plan of care) Comprehensive medication review performed; medication list updated in electronic medical record  Hypertension Screening Controlled; BP goal <140/90 Current regimen:  Losartan 243mdaily Interventions: Discussed blood pressure goals Maintain hypertension medication regimen.  Check blood pressure if you feel "swimmy headed"/ dizziness  Hyperlipidemia LDL at  goal of <100; Tg not at goal of <150 Current regimen:  Rosuvastatin 39m - take 1 tablet daily (helps to lower LDL / "bad cholesterol") Krill Oil 5073m- take 1 tablet daily (helps to lower triglycerides) Interventions: Discussed recent lipid results and goals Dicussed diet and exercise Discussed possibly increasing Krill Oil to bid but patient states tablet if very large and prefers to only take one per day Recommended maintain cholesterol medication regimen.  Continue to follow diet rich in vegetables and low in saturated and trans fat (limiting animal fat / products and processed foods)   Diabetes Controlled and has achieved A1c goal 7.5% Current regimen:  Glimepiride 62m58mwice daily with morning and evening meal  Metformin XR 500m41mily Current glucose readings: 98, 57, 79, 66, 74, 85, 112, 110, 66, 82 Reports occasional lightheadedness at night when she is reading. Will usually eat a snack and improves. Refill records show glimepiride is past due but patient has plenty on hand.  Current meal patterns: reports that meal schedule is variable and sometimes eats small meal in evening.  Eating mostly vegetables Interventions: Discussed goals for diabetes Recommended trial of taking lower dose of glimepride - 62mg 22mth breakfast but only 2mg w71m evening meal to see if evening hypoglycemia and lows in am improved.  Continue to monitor for low blood glucose - less than 70. Reviewed how to identify and treat hypoglycemia.  Check blood sugar once daily, document, and provide at future appointments Discussed important of taking glimepiride twice a day. Recommended placing bottle in area that she will see everyday or use pill container.   Osteopenia History of sacral fracture in 2020 Current regimen:  Calcium citrate 315mg -42me 2 tablets - 630mg da93mVitamin D 2000 units daily Last DEXA 11/27/2018 Forearm Radius 33% T-score =  -2.0. (was -1.6 on 11/13/2016) AP Spine L1-L2 T-Score = 1.8 (was 1.4 on 11/13/2016)  Dual Femur Total T-Score =  (was 0.3 on 11/13/2016) FRAX* 10-year Probability of Fracture Based on femoral neck BMD: DualFemur (Right) Major Osteoporotic Fracture: 17.6% Hip Fracture:                3.3% Interventions:  Consider repeat DEXA Scan Continue to take current calcium and vitamin D supplements Continue to walk daily Discussed fall prevention  Pain in sacral area (history of sacral fracture):  Improved Pharmacist Clinical Goal(s) Over the next 180 days, patient will work with PharmD and providers to reduce pain in sacral area Current regimen:  none Interventions: Dr Blyth's Frederik Peartes mentioned trial of either Voltaren / diclofenac gel - apply to area of pain as needed up to 4 times a day or lidocaine gel / patch - apply to area of pain daily for 12 hours then remove patch for 12 hours. Discussed pros and cons of both these options.  Fall assessment completed Patient self care activities - Over the next 180 days, patient will: Purchase Voltaren / diclofenac gel over the counter - apply up to 4 times a day to area of pain on back / bottom as needed.  Practice fall prevention techniques  Medication management Pharmacist Clinical Goal(s): Over the next 180 days, patient will  work with PharmD and providers to achieve optimal medication adherence Current pharmacy: [facility] Interventions Comprehensive medication review performed. Continue current medication management strategy Reviewed refill history Discussed medication adherence strategies, especially for glimepiride.  Patient self care activities - Over the next 180 days, patient will: Focus on medication adherence  by filling and taking medications appropriately  Take medications as prescribed Report any questions or concerns to PharmD and/or provider(s)  Patient Goals/Self-Care Activities Over the next 180 days, patient will:  take medications as prescribed, check glucose daily, document, and provide at future appointments, target a minimum of 150 minutes of moderate intensity exercise weekly, engage in dietary modifications by limiting intake of saturated and trans fat, and sugar / simple carbohydrates.   Follow Up Plan: Telephone follow up appointment with care management team member scheduled for:  Chronic Care Management CMA will check home blood glucose reading in 4 weeks; Sees Dr Charlett Lester in December 2022. And has f/u with clinical pharmacist in January 2023.      H     Medication Assistance:  Patient mentions that generic Cortisporin Ear Drops were more expensive that she was expecting at $47. Reviewed her formulary and they are listed as tier 3 which is $47 copay. Reviewed GoodRx price and is more than her coapy at $29. Patient [place] with cost for now.   - review at 05/2021 visit  Patient's preferred pharmacy is:  [facility]  ([place]) - [place], [facility] [address] [place] [address] [place] [address] Phone: [phone] Fax: [place] [address] [place] [address] [place] [address] Phone: [phone] Fax: [phone]  [facility] ([facility]) - [place],  [place] [address] [place] [address] Phone: [phone] Fax: [phone]   Follow Up:  Patient agrees to Care Plan and Follow-up.  Plan: Telephone follow up appointment with care management team member scheduled for:  3 months   Pharmacy team assistant will outreach patient to check home blood glucose  in 2 to 4 weeks.   [name], PharmD Clinical Pharmacist [facility]

## 2021-10-23 DIAGNOSIS — E782 Mixed hyperlipidemia: Secondary | ICD-10-CM | POA: Diagnosis not present

## 2021-10-23 DIAGNOSIS — E119 Type 2 diabetes mellitus without complications: Secondary | ICD-10-CM | POA: Diagnosis not present

## 2021-11-01 NOTE — Telephone Encounter (Signed)
Error

## 2021-11-07 ENCOUNTER — Telehealth: Payer: Self-pay | Admitting: Pharmacist

## 2021-11-07 ENCOUNTER — Other Ambulatory Visit: Payer: Self-pay

## 2021-11-07 ENCOUNTER — Encounter: Payer: Self-pay | Admitting: Family Medicine

## 2021-11-07 ENCOUNTER — Other Ambulatory Visit (HOSPITAL_BASED_OUTPATIENT_CLINIC_OR_DEPARTMENT_OTHER): Payer: Self-pay

## 2021-11-07 ENCOUNTER — Ambulatory Visit (INDEPENDENT_AMBULATORY_CARE_PROVIDER_SITE_OTHER): Payer: Medicare Other | Admitting: Family Medicine

## 2021-11-07 VITALS — BP 132/80 | HR 64 | Temp 98.1°F | Ht 64.0 in | Wt 157.4 lb

## 2021-11-07 DIAGNOSIS — M25561 Pain in right knee: Secondary | ICD-10-CM

## 2021-11-07 DIAGNOSIS — R197 Diarrhea, unspecified: Secondary | ICD-10-CM | POA: Diagnosis not present

## 2021-11-07 DIAGNOSIS — E119 Type 2 diabetes mellitus without complications: Secondary | ICD-10-CM | POA: Diagnosis not present

## 2021-11-07 DIAGNOSIS — E782 Mixed hyperlipidemia: Secondary | ICD-10-CM

## 2021-11-07 DIAGNOSIS — M8580 Other specified disorders of bone density and structure, unspecified site: Secondary | ICD-10-CM | POA: Diagnosis not present

## 2021-11-07 MED ORDER — ACCU-CHEK FASTCLIX LANCET KIT: PACK | 0 refills | Status: AC

## 2021-11-07 MED ORDER — GLIMEPIRIDE 4 MG PO TABS
ORAL_TABLET | ORAL | 1 refills | Status: DC
  Filled 2021-11-07: qty 135, 90d supply, fill #0

## 2021-11-07 NOTE — Chronic Care Management (AMB) (Signed)
Chronic Care Management Pharmacy Assistant   Name: Gail Lester  MRN: [id number] DOB: [birth year]  Reason for Encounter: Disease State Diabetes Mellitus  Recent office visits:  11/07/21-Stacey A. Blyth,MD (PCP, Open encounter) Seen for knee pain.  Recent consult visits:  None noted  Hospital visits:  None in previous 6 months  Medications: Outpatient Encounter Medications as of 11/07/2021  Medication Sig Note   ACCU-CHEK AVIVA PLUS test strip CHECK BLOOD SUGAR 4 TIMES  DAILY AND AS NEEDED FOR  LABILE BLOOD SUGARS    aspirin 81 MG tablet Take 81 mg by mouth daily.    Blood Glucose Monitoring Suppl (ACCU-CHEK AVIVA PLUS) W/DEVICE KIT Use as directed    CALCIUM PO Take 630 mg by mouth daily. Taking 2 tablets of calcium citrate 315 each - 647m total    Cholecalciferol (VITAMIN D3) 50 MCG (2000 UT) TABS Take 1 tablet by mouth daily.    COVID-19 mRNA bivalent vaccine, Pfizer, injection Inject into the muscle.    glimepiride (AMARYL) 4 MG tablet TAKE 1 TABLET BY MOUTH  TWICE DAILY 09/29/2021: 09/29/2021 - lowered dose to 43mwith breakfast and 36m39mith evening meal due to low BG   Krill Oil 500 MG CAPS Take 1 capsule by mouth daily.    losartan (COZAAR) 25 MG tablet TAKE 1 TABLET BY MOUTH  DAILY    LUTEIN PO Take 25 mg by mouth daily. 11/08/2020: For eye health, recommended by ophthalmologist   metFORMIN (GLUCOPHAGE-XR) 500 MG 24 hr tablet TAKE 1 TABLET BY MOUTH  DAILY    Multiple Vitamin (MULTIVITAMIN) tablet Take 1 tablet by mouth daily.     rosuvastatin (CRESTOR) 10 MG tablet Take 1 tablet (10 mg total) by mouth daily.    tiZANidine (ZANAFLEX) 2 MG tablet Take 0.5-2 tablets (1-4 mg total) by mouth every 6 (six) hours as needed for muscle spasms.    No facility-administered encounter medications on file as of 11/07/2021.   Current antihyperglycemic regimen:  Glimepiride 4mg536mice daily with morning and 2 mgevening meal  Metformin XR 500mg64mly  What recent interventions/DTPs  have been made to improve glycemic control:  None noted  Have there been any recent hospitalizations or ED visits since last visit with CPP? No  Patient denies hypoglycemic symptoms, including Pale, Sweaty, Shaky, Hungry, Nervous/irritable, and Vision changes  Patient denies hyperglycemic symptoms, including blurry vision, excessive thirst, fatigue, polyuria, and weakness  How often are you checking your blood sugar? twice daily  What are your blood sugars ranging?  Fasting: N/A Before meals:N/A  After meals: N/A Bedtime: N/A  During the week, how often does your blood glucose drop below 70? Never  Are you checking your feet daily/regularly?        Patient states she does check her feet daily.  Patient states she has not been able to check her blood sugar due to her pen needle not working so she is going to day to get one at her local pharmacy. Patient states she has been taking her glimepiride 1 tab in the morning and 1/2 a tab in the evening.  Adherence Review: Is the patient currently on a STATIN medication? Yes Is the patient currently on ACE/ARB medication? Yes Does the patient have >5 day gap between last estimated fill dates? No   Star Rating Drugs: Losartan 25 mg Last filled:09/22/21 90 DS Rosuvastatin 10 mg Last filled:09/22/21 90 DS Metformin 500 mg Last filled:09/22/21 90 DS  Myriam EstraElta [place] HArley

## 2021-11-07 NOTE — Patient Instructions (Signed)
Acute Knee Pain, Adult °Many things can cause knee pain. Sometimes, knee pain is sudden (acute) and may be caused by damage, swelling, or irritation of the muscles and tissues that support your knee. °The pain often goes away on its own with time and rest. If the pain does not go away, tests may be done to find out what is causing the pain. °Follow these instructions at home: °If you have a knee sleeve or brace: ° °Wear the knee sleeve or brace as told by your doctor. Take it off only as told by your doctor. °Loosen it if your toes: °Tingle. °Become numb. °Turn cold and blue. °Keep it clean. °If the knee sleeve or brace is not waterproof: °Do not let it get wet. °Cover it with a watertight covering when you take a bath or shower. °Activity °Rest your knee. °Do not do things that cause pain or make pain worse. °Avoid activities where both feet leave the ground at the same time (high-impact activities). Examples are running, jumping rope, and doing jumping jacks. °Work with a physical therapist to make a safe exercise program, as told by your doctor. °Managing pain, stiffness, and swelling ° °If told, put ice on the knee. To do this: °If you have a removable knee sleeve or brace, take it off as told by your doctor. °Put ice in a plastic bag. °Place a towel between your skin and the bag. °Leave the ice on for 20 minutes, 2-3 times a day. °Take off the ice if your skin turns bright red. This is very important. If you cannot feel pain, heat, or cold, you have a greater risk of damage to the area. °If told, use an elastic bandage to put pressure (compression) on your injured knee. °Raise your knee above the level of your heart while you are sitting or lying down. °Sleep with a pillow under your knee. °General instructions °Take over-the-counter and prescription medicines only as told by your doctor. °Do not smoke or use any products that contain nicotine or tobacco. If you need help quitting, ask your doctor. °If you are  overweight, work with your doctor and a food expert (dietitian) to set goals to lose weight. Being overweight can make your knee hurt more. °Watch for any changes in your symptoms. °Keep all follow-up visits. °Contact a doctor if: °The knee pain does not stop. °The knee pain changes or gets worse. °You have a fever along with knee pain. °Your knee is red or feels warm when you touch it. °Your knee gives out or locks up. °Get help right away if: °Your knee swells, and the swelling gets worse. °You cannot move your knee. °You have very bad knee pain that does not get better with pain medicine. °Summary °Many things can cause knee pain. The pain often goes away on its own with time and rest. °Your doctor may do tests to find out the cause of the pain. °Watch for any changes in your symptoms. Relieve your pain with rest, medicines, light activity, and use of ice. °Get help right away if you cannot move your knee or your knee pain is very bad. °This information is not intended to replace advice given to you by your health care provider. Make sure you discuss any questions you have with your health care provider. °Document Revised: 05/25/2020 Document Reviewed: 05/25/2020 °Elsevier Patient Education © 2022 [employer] ° °

## 2021-11-07 NOTE — Progress Notes (Signed)
Patient ID: Gail Lester, female    DOB: [birth year]  Age: 80 y.o. MRN: [id number]    Subjective:   Chief Complaint  Patient presents with   Knee Pain    Right   Please use [facility] today   Subjective   HPI [name] presents for office visit today for follow up on knee pain and type 2 diabetes. Denies CP/palp/SOB/HA/congestion/fevers/GI or GU c/o. Taking meds as prescribed.  Worsening right knee pain: She reports that onset of right knee pain was 3 weeks ago. Initially she was having trouble with her left knee that she got treated for, then right knee pain started. It has started at the lateral side then spread to  the medial meniscus. She noted heat and some swelling. She takes 3-4 tablets NSAIDs for pain relief.  Diabetes: She states that her average glucose reading is in the 70s, but never goes below 70. She also need a new insulin pen, since her current one broke.   Review of Systems  Constitutional:  Negative for chills, fatigue and fever.  HENT:  Negative for congestion, rhinorrhea, sinus pressure, sinus pain and sore throat.   Eyes:  Negative for pain.  Respiratory:  Negative for cough and shortness of breath.   Cardiovascular:  Negative for chest pain, palpitations and leg swelling.  Gastrointestinal:  Negative for abdominal pain, blood in stool, diarrhea, nausea and vomiting.  Genitourinary:  Negative for decreased urine volume, flank pain, frequency, vaginal bleeding and vaginal discharge.  Musculoskeletal:  Negative for back pain.  Neurological:  Negative for headaches.   History 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   Epidermal cyst 01/21/2017   Fibrocystic breast 02/10/2013   Left h/o FN biopsy    Ganglion cyst 11/14/2014   Right 2nd finger   GERD (gastroesophageal reflux disease) 10/09/2020   Hemangioma 09/28/2014   History of cataracts    Previous  surgery to remove bilaterally   History of chicken pox    as a child   History of colonic polyp 11/21/2017   History of measles    as a child   History of mumps    as a child   Hypercholesteremia    Hyperlipidemia, mixed 02/10/2013   Melanocytic nevi, unspecified 09/28/2014   Mild neurocognitive disorder, unclear etiology 11/21/2020   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 08/14/2015   Onychomycosis 02/24/2017   Osteopenia 05/23/2017   Seborrheic keratosis 09/28/2014   Type 2 diabetes mellitus 02/10/2013    She has a past surgical history that includes Appendectomy (1955); ORIF wrist fracture (12/09/2011); Breast biopsy ([id number]); Eye surgery (Right, 04/2019); and Eye surgery (Left, 03/2020).   Her family history includes Alcoholism in her mother; Alzheimer's disease in her maternal aunt; Cancer in her maternal aunt and maternal grandmother; Colon cancer in an other family member; Diabetes in her father; Kidney disease in her maternal grandmother and mother; Scoliosis in her son.She reports that she quit smoking about 31 years ago. Her smoking use included cigarettes. She has never used smokeless tobacco. She reports that she does not drink alcohol and does not use drugs.  Current Outpatient Medications on File Prior to Visit  Medication Sig Dispense Refill   ACCU-CHEK AVIVA PLUS test strip CHECK BLOOD SUGAR 4 TIMES  DAILY AND AS NEEDED FOR  LABILE BLOOD SUGARS 100 each 11   aspirin 81 MG tablet Take 81  mg by mouth daily.     Blood Glucose Monitoring Suppl (ACCU-CHEK AVIVA PLUS) W/DEVICE KIT Use as directed 1 kit 0   CALCIUM PO Take 630 mg by mouth daily. Taking 2 tablets of calcium citrate 315 each - 643m total     Cholecalciferol (VITAMIN D3) 50 MCG (2000 UT) TABS Take 1 tablet by mouth daily.     COVID-19 mRNA bivalent vaccine, Pfizer, injection Inject into the muscle. 0.3 mL 0   Krill Oil 500 MG CAPS Take 1 capsule by mouth daily.     losartan (COZAAR) 25 MG tablet TAKE 1 TABLET BY MOUTH   DAILY 90 tablet 1   LUTEIN PO Take 25 mg by mouth daily.     metFORMIN (GLUCOPHAGE-XR) 500 MG 24 hr tablet TAKE 1 TABLET BY MOUTH  DAILY 90 tablet 3   Multiple Vitamin (MULTIVITAMIN) tablet Take 1 tablet by mouth daily.      rosuvastatin (CRESTOR) 10 MG tablet Take 1 tablet (10 mg total) by mouth daily. 90 tablet 3   tiZANidine (ZANAFLEX) 2 MG tablet Take 0.5-2 tablets (1-4 mg total) by mouth every 6 (six) hours as needed for muscle spasms. 30 tablet 1   No current facility-administered medications on file prior to visit.     Objective:  Objective  Physical Exam Constitutional:      General: She is not in acute distress.    Appearance: Normal appearance. She is not ill-appearing or toxic-appearing.  HENT:     Head: Normocephalic and atraumatic.     Right Ear: Tympanic membrane, ear canal and external ear normal.     Left Ear: Tympanic membrane, ear canal and external ear normal.     Nose: No congestion or rhinorrhea.  Eyes:     Extraocular Movements: Extraocular movements intact.     Pupils: Pupils are equal, round, and reactive to light.  Cardiovascular:     Rate and Rhythm: Normal rate and regular rhythm.     Pulses: Normal pulses.     Heart sounds: Normal heart sounds. No murmur heard. Pulmonary:     Effort: Pulmonary effort is normal. No respiratory distress.     Breath sounds: Normal breath sounds. No wheezing, rhonchi or rales.  Abdominal:     General: Bowel sounds are normal.     Palpations: Abdomen is soft. There is no mass.     Tenderness: There is no abdominal tenderness. There is no guarding.     Hernia: No hernia is present.  Musculoskeletal:        General: Normal range of motion.     Cervical back: Normal range of motion and neck supple.  Skin:    General: Skin is warm and dry.  Neurological:     Mental Status: She is alert and oriented to person, place, and time.  Psychiatric:        Behavior: Behavior normal.   BP 132/80   Pulse 64   Temp 98.1 F (36.7  C) (Oral)   Ht _0  (1.626 m)   Wt 157 lb 6 oz (71.4 kg)   SpO2 97%   BMI 27.01 kg/m  Wt Readings from Last 3 Encounters:  11/07/21 157 lb 6 oz (71.4 kg)  04/10/21 153 lb 3.2 oz (69.5 kg)  04/06/21 153 lb 6.4 oz (69.6 kg)     Lab Results  Component Value Date   WBC 7.3 04/10/2021   HGB 12.9 04/10/2021   HCT 39.0 04/10/2021   PLT 303.0 04/10/2021  GLUCOSE 113 (H) 04/10/2021   CHOL 145 04/10/2021   TRIG 236.0 (H) 04/10/2021   HDL 50.10 04/10/2021   LDLDIRECT 83.0 04/10/2021   LDLCALC 71 10/06/2020   ALT 18 04/10/2021   AST 23 04/10/2021   NA 138 04/10/2021   K 4.6 04/10/2021   CL 102 04/10/2021   CREATININE 0.70 04/10/2021   BUN 12 04/10/2021   CO2 29 04/10/2021   TSH 2.92 04/10/2021   HGBA1C 7.5 (H) 04/10/2021   MICROALBUR 1.6 11/03/2015    MM 3D SCREEN BREAST BILATERAL  Result Date: 04/11/2020 CLINICAL DATA:  Screening. EXAM: DIGITAL SCREENING BILATERAL MAMMOGRAM WITH TOMO AND CAD COMPARISON:  Previous exam(s). ACR Breast Density Category b: There are scattered areas of fibroglandular density. FINDINGS: There are no findings suspicious for malignancy. Images were processed with CAD. IMPRESSION: No mammographic evidence of malignancy. A result letter of this screening mammogram will be mailed directly to the patient. RECOMMENDATION: Screening mammogram in one year. (Code:[id number]-B-01Y) BI-RADS CATEGORY  1: Negative. Electronically Signed   By: Lovey Newcomer M.D.   On: 04/11/2020 15:41     Assessment & Plan:  Plan    Meds ordered this encounter  Medications   glimepiride (AMARYL) 4 MG tablet    Sig: Take 1 tablet by mouth every morning and 1/2 tablet in the evening (4 mg each morning and 2 mg each evening)    Dispense:  135 tablet    Refill:  1    Requesting 1 year supply   Lancets Misc. (ACCU-CHEK FASTCLIX LANCET) KIT    Sig: Use to check sugar 4 times a day.  Dx code: E11.9    Dispense:  1 kit    Refill:  0    Problem List Items Addressed This Visit     Type  2 diabetes mellitus ([place])    hgba1c acceptable, minimize simple carbs. Increase exercise as tolerated. Continue current meds. She is doing well on the adjusted dose of Glimeperide to 4 mg in am and 2 mg in pm. Very infrequent numbers in the 46s and she does not feel badly when this occurs. Refill given today      Relevant Medications   glimepiride (AMARYL) 4 MG tablet   Other Relevant Orders   Hemoglobin A1c   Comprehensive metabolic panel   Uric acid   Hyperlipidemia, mixed    Encourage heart healthy diet such as MIND or DASH diet, increase exercise, avoid trans fats, simple carbohydrates and processed foods, consider a krill or fish or flaxseed oil cap daily. Tolerating Rosuvastatin      Relevant Orders   Lipid panel   Uric acid   Diarrhea   Relevant Orders   CBC   Uric acid   Acute pain of right knee - Primary    She denies any acute injury but has been struggling with roughly 3 weeks of knee pain and swelling. Pain started lateral and caudal and has migrated to medial and slightly distal to knee. Worse with ambulation and certain movements. Has seen Sports medicine in the past for her other knee and done well so she is referred for further consideration and treatment. Continue topical treatments and can alter Aspercreme, Lidocaine, Biofreeze.       Relevant Medications   glimepiride (AMARYL) 4 MG tablet   Other Relevant Orders   Ambulatory referral to Sports Medicine   Hemoglobin A1c   CBC   Comprehensive metabolic panel   Lipid panel   TSH   Uric acid  Osteopenia    Follow-up: No follow-ups on file.  I, Suezanne Jacquet, acting as a scribe for Penni Homans, MD, have documented all relevent documentation on behalf of Penni Homans, MD, as directed by Penni Homans, MD while in the presence of Penni Homans, MD. DO:11/08/21.  I, Mosie Lukes, MD personally performed the services described in this documentation. All medical record entries made by the scribe were at my direction  and in my presence. I have reviewed the chart and agree that the record reflects my personal performance and is accurate and complete

## 2021-11-08 ENCOUNTER — Ambulatory Visit: Payer: Medicare Other | Admitting: Family Medicine

## 2021-11-08 ENCOUNTER — Ambulatory Visit: Payer: Self-pay

## 2021-11-08 VITALS — BP 132/80 | HR 64 | Ht 64.0 in | Wt 157.6 lb

## 2021-11-08 DIAGNOSIS — M25561 Pain in right knee: Secondary | ICD-10-CM

## 2021-11-08 LAB — COMPREHENSIVE METABOLIC PANEL
ALT: 15 U/L (ref 0–35)
AST: 21 U/L (ref 0–37)
Albumin: 4.3 g/dL (ref 3.5–5.2)
Alkaline Phosphatase: 50 U/L (ref 39–117)
BUN: 14 mg/dL (ref 6–23)
CO2: 27 mEq/L (ref 19–32)
Calcium: 9.4 mg/dL (ref 8.4–10.5)
Chloride: 104 mEq/L (ref 96–112)
Creatinine, Ser: 0.66 mg/dL (ref 0.40–1.20)
GFR: 82.89 mL/min (ref >60.00)
Glucose, Bld: 67 mg/dL — ABNORMAL LOW (ref 70–99)
Potassium: 4 mEq/L (ref 3.5–5.1)
Sodium: 140 mEq/L (ref 135–145)
Total Bilirubin: 0.4 mg/dL (ref 0.2–1.2)
Total Protein: 6.9 g/dL (ref 6.0–8.3)

## 2021-11-08 LAB — CBC
HCT: 37.9 % (ref 36.0–46.0)
Hemoglobin: 12.3 g/dL (ref 12.0–15.0)
MCHC: 32.5 g/dL (ref 30.0–36.0)
MCV: 88.6 fl (ref 78.0–100.0)
Platelets: 290 10*3/uL (ref 150.0–400.0)
RBC: 4.28 Mil/uL (ref 3.87–5.11)
RDW: 13.4 % (ref 11.5–15.5)
WBC: 7.9 10*3/uL (ref 4.0–10.5)

## 2021-11-08 LAB — LIPID PANEL
Cholesterol: 162 mg/dL (ref 0–200)
HDL: 62.4 mg/dL (ref >39.00)
LDL Cholesterol: 72 mg/dL (ref 0–99)
NonHDL: 99.49
Total CHOL/HDL Ratio: 3
Triglycerides: 135 mg/dL (ref 0.0–149.0)
VLDL: 27 mg/dL (ref 0.0–40.0)

## 2021-11-08 LAB — TSH: TSH: 2.58 u[IU]/mL (ref 0.35–5.50)

## 2021-11-08 LAB — HEMOGLOBIN A1C: Hgb A1c MFr Bld: 7.9 % — ABNORMAL HIGH (ref 4.6–6.5)

## 2021-11-08 LAB — URIC ACID: Uric Acid, Serum: 4.6 mg/dL (ref 2.4–7.0)

## 2021-11-08 NOTE — Assessment & Plan Note (Addendum)
She denies any acute injury but has been struggling with roughly 3 weeks of knee pain and swelling. Pain started lateral and caudal and has migrated to medial and slightly distal to knee. Worse with ambulation and certain movements. Has seen Sports medicine in the past for her other knee and done well so she is referred for further consideration and treatment. Continue topical treatments and can alter Aspercreme, Lidocaine, Biofreeze.

## 2021-11-08 NOTE — Progress Notes (Signed)
I, Peterson Lombard, LAT, ATC acting as a scribe for Lynne Leader, MD.  Subjective:    CC: R knee pain  HPI: Pt is an 80 y/o female c/o R knee pain ongoing for about 3 weeks. MOI: Pt went to flip out of bed one morning and experienced severe R knee pain. Pt notes she had a similar feeling in her L knee about 4 years and was treated by Dr Paulla Fore. Pt locates pain to the anterior lateral aspect and pain has moved to more the anterior-medial aspect. Pt notes a "broken blood vessel" over the initial location of pain. Pt notes a warm feeling over knee.  R knee swelling: yes Mechanical symptoms: yes Aggravates: walking Treatments tried: NSAIDs, rest, aspercream  Pertinent review of Systems: No fevers or chills  Relevant historical information: Diabetes   Objective:    Vitals:   11/08/21 1331  BP: 132/80  Pulse: 64  SpO2: 97%   General: Well Developed, well nourished, and in no acute distress.   MSK: Right knee mild effusion normal-appearing otherwise. Range of motion 3-120 degrees with crepitation. Tender palpation medial joint line. Stable ligamentous exam. Positive medial McMurray's test. Intact strength.  Lab and Radiology Results  Procedure: Real-time Ultrasound Guided Injection of knee superior lateral patellar space Device: Philips Affiniti 50G Images permanently stored and available for review in PACS Ultrasound evaluation prior to injection reveals a mild joint effusion.  Narrowed medial joint line.  No Baker's cyst. Verbal informed consent obtained.  Discussed risks and benefits of procedure. Warned about infection bleeding damage to structures skin hypopigmentation and fat atrophy among others. Patient expresses understanding and agreement Time-out conducted.   Noted no overlying erythema, induration, or other signs of local infection.   Skin prepped in a sterile fashion.   Local anesthesia: Topical Ethyl chloride.   With sterile technique and under real time  ultrasound guidance: 40 mg of Kenalog and 2 mL of Marcaine injected into knee joint. Fluid seen entering the joint capsule.   Completed without difficulty   Pain immediately resolved suggesting accurate placement of the medication.   Advised to call if fevers/chills, erythema, induration, drainage, or persistent bleeding.   Images permanently stored and available for review in the ultrasound unit.  Impression: Technically successful ultrasound guided injection.    X-ray images right knee ordered at the time of the visit but not completed.   Impression and Recommendations:    Assessment and Plan: 80 y.o. 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  Plan for steroid injection, Voltaren gel, and compression.  Also recommend quad strengthening exercises.  Recheck in about 6 weeks.  PDMP not reviewed this encounter. Orders Placed This Encounter  Procedures   [place] LIMITED JOINT SPACE STRUCTURES LOW RIGHT(NO LINKED CHARGES)    Order Specific Question:   Reason for Exam (SYMPTOM  OR DIAGNOSIS REQUIRED)    Answer:   eval knee    Order Specific Question:   Preferred imaging location?    Answer:   [facility]   DG Knee AP/LAT W/Sun[place] Right    Standing Status:   Future    Standing Expiration Date:   11/08/2022    Order Specific Question:   Reason for Exam (SYMPTOM  OR DIAGNOSIS REQUIRED)    Answer:   right knee pain    Order Specific Question:   Preferred imaging location?    Answer:   [address]   No orders of the defined types were  placed in this encounter.   Discussed warning signs or symptoms. Please see discharge instructions. Patient expresses understanding.   The above documentation has been reviewed and is accurate and complete Lynne Leader, M.D.

## 2021-11-08 NOTE — Assessment & Plan Note (Signed)
hgba1c acceptable, minimize simple carbs. Increase exercise as tolerated. Continue current meds. She is doing well on the adjusted dose of Glimeperide to 4 mg in am and 2 mg in pm. Very infrequent numbers in the 70s and she does not feel badly when this occurs. Refill given today

## 2021-11-08 NOTE — Assessment & Plan Note (Signed)
Encourage heart healthy diet such as MIND or DASH diet, increase exercise, avoid trans fats, simple carbohydrates and processed foods, consider a krill or fish or flaxseed oil cap daily.  Tolerating Rosuvastatin 

## 2021-11-08 NOTE — Patient Instructions (Addendum)
Thank you for coming in today.   You received an injection today. Seek immediate medical attention if the joint becomes red, extremely painful, or is oozing fluid.   Please get an Xray today at the [facility] office today.   Please use Voltaren gel (Generic Diclofenac Gel) up to 4x daily for pain as needed.  This is available over-the-counter as both the name brand Voltaren gel and the generic diclofenac gel.   Recheck in about 6 weeks.

## 2021-11-09 ENCOUNTER — Ambulatory Visit (INDEPENDENT_AMBULATORY_CARE_PROVIDER_SITE_OTHER)
Admission: RE | Admit: 2021-11-09 | Discharge: 2021-11-09 | Disposition: A | Discharge status: Home / Self Care | Payer: Medicare Other | Source: Ambulatory Visit | Attending: Family Medicine | Admitting: Family Medicine

## 2021-11-09 ENCOUNTER — Other Ambulatory Visit: Payer: Self-pay

## 2021-11-09 DIAGNOSIS — M25561 Pain in right knee: Secondary | ICD-10-CM | POA: Diagnosis not present

## 2021-11-09 DIAGNOSIS — M7989 Other specified soft tissue disorders: Secondary | ICD-10-CM | POA: Diagnosis not present

## 2021-11-10 NOTE — Progress Notes (Signed)
Right knee xray does not show severe arthritis or a fracture.

## 2021-11-23 ENCOUNTER — Encounter: Payer: Medicare Other | Admitting: Family Medicine

## 2021-12-01 ENCOUNTER — Encounter: Payer: Self-pay | Admitting: Family Medicine

## 2021-12-01 ENCOUNTER — Ambulatory Visit (INDEPENDENT_AMBULATORY_CARE_PROVIDER_SITE_OTHER): Payer: Medicare Other | Admitting: Family Medicine

## 2021-12-01 VITALS — BP 124/62 | HR 69 | Temp 98.0°F | Resp 16 | Ht 64.0 in | Wt 152.6 lb

## 2021-12-01 DIAGNOSIS — M8580 Other specified disorders of bone density and structure, unspecified site: Secondary | ICD-10-CM

## 2021-12-01 DIAGNOSIS — R197 Diarrhea, unspecified: Secondary | ICD-10-CM

## 2021-12-01 DIAGNOSIS — Z0000 Encounter for general adult medical examination without abnormal findings: Secondary | ICD-10-CM | POA: Diagnosis not present

## 2021-12-01 DIAGNOSIS — M25561 Pain in right knee: Secondary | ICD-10-CM | POA: Diagnosis not present

## 2021-12-01 DIAGNOSIS — E782 Mixed hyperlipidemia: Secondary | ICD-10-CM | POA: Diagnosis not present

## 2021-12-01 DIAGNOSIS — R252 Cramp and spasm: Secondary | ICD-10-CM

## 2021-12-01 DIAGNOSIS — E119 Type 2 diabetes mellitus without complications: Secondary | ICD-10-CM

## 2021-12-01 DIAGNOSIS — H9190 Unspecified hearing loss, unspecified ear: Secondary | ICD-10-CM | POA: Diagnosis not present

## 2021-12-01 MED ORDER — ACCU-CHEK AVIVA PLUS VI STRP: ORAL_STRIP | 11 refills | Status: DC

## 2021-12-01 MED ORDER — ACCU-CHEK AVIVA PLUS W/DEVICE KIT: PACK | 0 refills | Status: DC

## 2021-12-01 MED ORDER — ACCU-CHEK SOFTCLIX LANCETS MISC: 12 refills | Status: DC

## 2021-12-01 NOTE — Assessment & Plan Note (Signed)
Right knee has improved to some degree with treatment from sports medicine and staying, not symptomatic in left knee but interested in imaging to assess arthritis in that knee

## 2021-12-01 NOTE — Assessment & Plan Note (Signed)
hgba1c acceptable, minimize simple carbs. Increase exercise as tolerated. Continue current meds 

## 2021-12-01 NOTE — Assessment & Plan Note (Signed)
Encourage heart healthy diet such as MIND or DASH diet, increase exercise, avoid trans fats, simple carbohydrates and processed foods, consider a krill or fish or flaxseed oil cap daily.  °

## 2021-12-01 NOTE — Patient Instructions (Addendum)
Yellow Split Powder Chia seeds Flaxseeds ground from whole seed Whey powder Hemp Seed Powder Collagen powder   Preventive Care 65 Years and Older, Female Preventive care refers to lifestyle choices and visits with your health care provider that can promote health and wellness. Preventive care visits are also called wellness exams. What can I expect for my preventive care visit? Counseling Your health care provider may ask you questions about your: Medical history, including: Past medical problems. Family medical history. Pregnancy and menstrual history. History of falls. Current health, including: Memory and ability to understand (cognition). Emotional well-being. Home life and relationship well-being. Sexual activity and sexual health. Lifestyle, including: Alcohol, nicotine or tobacco, and drug use. Access to firearms. Diet, exercise, and sleep habits. Work and work Statistician. Sunscreen use. Safety issues such as seatbelt and bike helmet use. Physical exam Your health care provider will check your: Height and weight. These may be used to calculate your BMI (body mass index). BMI is a measurement that tells if you are at a healthy weight. Waist circumference. This measures the distance around your waistline. This measurement also tells if you are at a healthy weight and may help predict your risk of certain diseases, such as type 2 diabetes and high blood pressure. Heart rate and blood pressure. Body temperature. Skin for abnormal spots. What immunizations do I need? Vaccines are usually given at various ages, according to a schedule. Your health care provider will recommend vaccines for you based on your age, medical history, and lifestyle or other factors, such as travel or where you work. What tests do I need? Screening Your health care provider may recommend screening tests for certain conditions. This may include: Lipid and cholesterol levels. Hepatitis C  test. Hepatitis B test. HIV (human immunodeficiency virus) test. STI (sexually transmitted infection) testing, if you are at risk. Lung cancer screening. Colorectal cancer screening. Diabetes screening. This is done by checking your blood sugar (glucose) after you have not eaten for a while (fasting). Mammogram. Talk with your health care provider about how often you should have regular mammograms. BRCA-related cancer screening. This may be done if you have a family history of breast, ovarian, tubal, or peritoneal cancers. Bone density scan. This is done to screen for osteoporosis. Talk with your health care provider about your test results, treatment options, and if necessary, the need for more tests. Follow these instructions at home: Eating and drinking  Eat a diet that includes fresh fruits and vegetables, whole grains, lean protein, and low-fat dairy products. Limit your intake of foods with high amounts of sugar, saturated fats, and salt. Take vitamin and mineral supplements as recommended by your health care provider. Do not drink alcohol if your health care provider tells you not to drink. If you drink alcohol: Limit how much you have to 0-1 drink a day. Know how much alcohol is in your drink. In the [place] one drink equals one 12 oz bottle of beer (355 mL), one 5 oz glass of wine (148 mL), or one 1 oz glass of hard liquor (44 mL). Lifestyle Brush your teeth every morning and night with fluoride toothpaste. Floss one time each day. Exercise for at least 30 minutes 5 or more days each week. Do not use any products that contain nicotine or tobacco. These products include cigarettes, chewing tobacco, and vaping devices, such as e-cigarettes. If you need help quitting, ask your health care provider. Do not use drugs. If you are sexually active, practice safe sex. Use  a condom or other form of protection in order to prevent STIs. Take aspirin only as told by your health care provider.  Make sure that you understand how much to take and what form to take. Work with your health care provider to find out whether it is safe and beneficial for you to take aspirin daily. Ask your health care provider if you need to take a cholesterol-lowering medicine (statin). Find healthy ways to manage stress, such as: Meditation, yoga, or listening to music. Journaling. Talking to a trusted person. Spending time with friends and family. Minimize exposure to UV radiation to reduce your risk of skin cancer. Safety Always wear your seat belt while driving or riding in a vehicle. Do not drive: If you have been drinking alcohol. Do not ride with someone who has been drinking. When you are tired or distracted. While texting. If you have been using any mind-altering substances or drugs. Wear a helmet and other protective equipment during sports activities. If you have firearms in your house, make sure you follow all gun safety procedures. What's next? Visit your health care provider once a year for an annual wellness visit. Ask your health care provider how often you should have your eyes and teeth checked. Stay up to date on all vaccines. This information is not intended to replace advice given to you by your health care provider. Make sure you discuss any questions you have with your health care provider. Document Revised: 06/07/2021 Document Reviewed: 06/07/2021 Elsevier Patient Education  [place].

## 2021-12-04 NOTE — Assessment & Plan Note (Signed)
Much better control since her medications were adjusted.

## 2021-12-04 NOTE — Progress Notes (Signed)
Subjective:    Patient ID: Gail Lester, female    DOB: [birth year], 80 y.o.   MRN: [id number]  Chief Complaint  Patient presents with   physical    HPI Patient is in today for annual preventative exam. No recent febrile illness or hospitalizations. No c/o polyuria or polydipsia. She continues to maintain a heart healthy diet and stays active. She is looking forward to the Holidays with her children who both live far away but who are coming home for the holidays. Denies CP/palp/SOB/HA/congestion/fevers/GI or GU c/o. Taking meds as prescribed   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   Epidermal cyst 01/21/2017   Fibrocystic breast 02/10/2013   Left h/o FN biopsy    Ganglion cyst 11/14/2014   Right 2nd finger   GERD (gastroesophageal reflux disease) 10/09/2020   Hemangioma 09/28/2014   History of cataracts    Previous surgery to remove bilaterally   History of chicken pox    as a child   History of colonic polyp 11/21/2017   History of measles    as a child   History of mumps    as a child   Hypercholesteremia    Hyperlipidemia, mixed 02/10/2013   Melanocytic nevi, unspecified 09/28/2014   Mild neurocognitive disorder, unclear etiology 11/21/2020   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 08/14/2015   Onychomycosis 02/24/2017   Osteopenia 05/23/2017   Seborrheic keratosis 09/28/2014   Type 2 diabetes mellitus 02/10/2013    Past Surgical History:  Procedure Laterality Date   APPENDECTOMY  1955   BREAST BIOPSY  1985-1990   benign   EYE SURGERY Right 04/2019   cataract removal   EYE SURGERY Left 03/2020   cataract removal   ORIF WRIST FRACTURE  12/09/2011   Procedure: OPEN REDUCTION INTERNAL FIXATION (ORIF) WRIST FRACTURE;  Surgeon: Linna Hoff;  Location: [facility];  Service: Orthopedics;  Laterality: Left;    Family History  Problem Relation Age of Onset   Kidney disease Mother     Alcoholism Mother    Diabetes Father    Kidney disease Maternal Grandmother    Cancer Maternal Grandmother        bladder   Cancer Maternal Aunt    Alzheimer's disease Maternal Aunt    Scoliosis Son    Colon cancer Other        grandparent    Social History   Socioeconomic History   Marital status: Divorced    Spouse name: Not on file   Number of children: Not on file   Years of education: 68   Highest education level: Some college, no degree  Occupational History   Not on file  Tobacco Use   Smoking status: Former    Types: Cigarettes    Quit date: 11/17/1989    Years since quitting: 32.0   Smokeless tobacco: Never  Substance and Sexual Activity   Alcohol use: No    Comment: rarely while in social settings   Drug use: No   Sexual activity: Not Currently  Other Topics Concern   Not on file  Social History Narrative   Not on file   Social Determinants of Health   Financial Resource Strain: Low Risk    Difficulty of Paying Living Expenses: Not hard at all  Food Insecurity: No Food Insecurity   Worried About [place] in the Last Year: Never  true   Ran Out of Food in the Last Year: Never true  Transportation Needs: No Transportation Needs   Lack of Transportation (Medical): No   Lack of Transportation (Non-Medical): No  Physical Activity: Sufficiently Active   Days of Exercise per Week: 7 days   Minutes of Exercise per Session: 30 min  Stress: No Stress Concern Present   Feeling of Stress : Not at all  Social Connections: Socially Isolated   Frequency of Communication with Friends and Family: More than three times a week   Frequency of Social Gatherings with Friends and Family: Once a week   Attends Religious Services: Never   Marine scientist or Organizations: No   Attends Music therapist: Never   Marital Status: Divorced  Human resources officer Violence: Not At Risk   Fear of Current or Ex-Partner: No   Emotionally Abused: No    Physically Abused: No   Sexually Abused: No    Outpatient Medications Prior to Visit  Medication Sig Dispense Refill   aspirin 81 MG tablet Take 81 mg by mouth daily.     CALCIUM PO Take 630 mg by mouth daily. Taking 2 tablets of calcium citrate 315 each - 636m total     Cholecalciferol (VITAMIN D3) 50 MCG (2000 UT) TABS Take 1 tablet by mouth daily.     glimepiride (AMARYL) 4 MG tablet Take 1 tablet by mouth every morning and 1/2 tablet in the evening (4 mg each morning and 2 mg each evening) 135 tablet 1   Iron-Vitamin C (IRON 100/C PO) Take by mouth.     Krill Oil 500 MG CAPS Take 1 capsule by mouth daily.     Lancets Misc. (ACCU-CHEK FASTCLIX LANCET) KIT Use to check sugar 4 times a day.  Dx code: E11.9 1 kit 0   losartan (COZAAR) 25 MG tablet TAKE 1 TABLET BY MOUTH  DAILY 90 tablet 1   LUTEIN PO Take 25 mg by mouth daily.     metFORMIN (GLUCOPHAGE-XR) 500 MG 24 hr tablet TAKE 1 TABLET BY MOUTH  DAILY 90 tablet 3   Multiple Vitamin (MULTIVITAMIN) tablet Take 1 tablet by mouth daily.      rosuvastatin (CRESTOR) 10 MG tablet Take 1 tablet (10 mg total) by mouth daily. 90 tablet 3   tiZANidine (ZANAFLEX) 2 MG tablet Take 0.5-2 tablets (1-4 mg total) by mouth every 6 (six) hours as needed for muscle spasms. 30 tablet 1   ACCU-CHEK AVIVA PLUS test strip CHECK BLOOD SUGAR 4 TIMES  DAILY AND AS NEEDED FOR  LABILE BLOOD SUGARS 100 each 11   Blood Glucose Monitoring Suppl (ACCU-CHEK AVIVA PLUS) W/DEVICE KIT Use as directed 1 kit 0   COVID-19 mRNA bivalent vaccine, Pfizer, injection Inject into the muscle. 0.3 mL 0   No facility-administered medications prior to visit.    Allergies  Allergen Reactions   Ramipril Diarrhea   Lipitor [Atorvastatin]     Diarrhea, muscle cramps   Lisinopril     myalgias    Review of Systems  Constitutional:  Negative for chills, fever and malaise/fatigue.  HENT:  Negative for congestion and hearing loss.   Eyes:  Negative for discharge.  Respiratory:   Negative for cough, sputum production and shortness of breath.   Cardiovascular:  Negative for chest pain, palpitations and leg swelling.  Gastrointestinal:  Negative for abdominal pain, blood in stool, constipation, diarrhea, heartburn, nausea and vomiting.  Genitourinary:  Negative for dysuria, frequency, hematuria and urgency.  Musculoskeletal:  Positive for joint pain and myalgias. Negative for back pain and falls.  Skin:  Negative for rash.  Neurological:  Negative for dizziness, sensory change, loss of consciousness, weakness and headaches.  Endo/Heme/Allergies:  Negative for environmental allergies. Does not bruise/bleed easily.  Psychiatric/Behavioral:  Negative for depression and suicidal ideas. The patient is not nervous/anxious and does not have insomnia.       Objective:    Physical Exam Constitutional:      General: She is not in acute distress.    Appearance: She is well-developed.  HENT:     Head: Normocephalic and atraumatic.  Eyes:     Conjunctiva/sclera: Conjunctivae normal.  Neck:     Thyroid: No thyromegaly.  Cardiovascular:     Rate and Rhythm: Normal rate and regular rhythm.     Heart sounds: Normal heart sounds. No murmur heard. Pulmonary:     Effort: Pulmonary effort is normal. No respiratory distress.     Breath sounds: Normal breath sounds.  Abdominal:     General: Bowel sounds are normal. There is no distension.     Palpations: Abdomen is soft. There is no mass.     Tenderness: There is no abdominal tenderness.  Musculoskeletal:     Cervical back: Neck supple.  Lymphadenopathy:     Cervical: No cervical adenopathy.  Skin:    General: Skin is warm and dry.  Neurological:     Mental Status: She is alert and oriented to person, place, and time.  Psychiatric:        Behavior: Behavior normal.    BP 124/62   Pulse 69   Temp 98 F (36.7 C)   Resp 16   Ht '5\' 4"'  (1.626 m)   Wt 152 lb 9.6 oz (69.2 kg)   SpO2 94%   BMI 26.19 kg/m  Wt Readings  from Last 3 Encounters:  12/01/21 152 lb 9.6 oz (69.2 kg)  11/08/21 157 lb 9.6 oz (71.5 kg)  11/07/21 157 lb 6 oz (71.4 kg)    Diabetic Foot Exam - Simple   Simple Foot Form Visual Inspection No deformities, no ulcerations, no other skin breakdown bilaterally: Yes Sensation Testing Intact to touch and monofilament testing bilaterally: Yes Pulse Check Posterior Tibialis and Dorsalis pulse intact bilaterally: Yes Comments    Lab Results  Component Value Date   WBC 7.9 11/07/2021   HGB 12.3 11/07/2021   HCT 37.9 11/07/2021   PLT 290.0 11/07/2021   GLUCOSE 67 (L) 11/07/2021   CHOL 162 11/07/2021   TRIG 135.0 11/07/2021   HDL 62.40 11/07/2021   LDLDIRECT 83.0 04/10/2021   LDLCALC 72 11/07/2021   ALT 15 11/07/2021   AST 21 11/07/2021   NA 140 11/07/2021   K 4.0 11/07/2021   CL 104 11/07/2021   CREATININE 0.66 11/07/2021   BUN 14 11/07/2021   CO2 27 11/07/2021   TSH 2.58 11/07/2021   HGBA1C 7.9 (H) 11/07/2021   MICROALBUR 1.6 11/03/2015    Lab Results  Component Value Date   TSH 2.58 11/07/2021   Lab Results  Component Value Date   WBC 7.9 11/07/2021   HGB 12.3 11/07/2021   HCT 37.9 11/07/2021   MCV 88.6 11/07/2021   PLT 290.0 11/07/2021   Lab Results  Component Value Date   NA 140 11/07/2021   K 4.0 11/07/2021   CO2 27 11/07/2021   GLUCOSE 67 (L) 11/07/2021   BUN 14 11/07/2021   CREATININE 0.66 11/07/2021   BILITOT 0.4 11/07/2021  ALKPHOS 50 11/07/2021   AST 21 11/07/2021   ALT 15 11/07/2021   PROT 6.9 11/07/2021   ALBUMIN 4.3 11/07/2021   CALCIUM 9.4 11/07/2021   GFR 82.89 11/07/2021   Lab Results  Component Value Date   CHOL 162 11/07/2021   Lab Results  Component Value Date   HDL 62.40 11/07/2021   Lab Results  Component Value Date   LDLCALC 72 11/07/2021   Lab Results  Component Value Date   TRIG 135.0 11/07/2021   Lab Results  Component Value Date   CHOLHDL 3 11/07/2021   Lab Results  Component Value Date   HGBA1C 7.9 (H)  11/07/2021       Assessment & Plan:   Problem List Items Addressed This Visit     Type 2 diabetes mellitus ([place])    hgba1c acceptable, minimize simple carbs. Increase exercise as tolerated. Continue current meds      Relevant Orders   Comprehensive metabolic panel   TSH   Hemoglobin A1c   Hyperlipidemia, mixed    Encourage heart healthy diet such as MIND or DASH diet, increase exercise, avoid trans fats, simple carbohydrates and processed foods, consider a krill or fish or flaxseed oil cap daily.       Relevant Orders   Lipid panel   TSH   Diarrhea    Much better control since her medications were adjusted.       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    Encouraged to consider an OTC hearing aide      Preventative health care    Patient encouraged to maintain heart healthy diet, regular exercise, adequate sleep. Consider daily probiotics. Take medications as prescribed. Labs ordered and reviewed. Has aged out of colonoscopies, paps and MGM was last done in April of 2021, can repeat every 1-2 years and her discretion and encouraged to continue Dexa scans every few years      Acute pain of right knee    Right knee has improved to some degree with treatment from sports medicine and staying, not symptomatic in left knee but interested in imaging to assess arthritis in that knee      Osteopenia   Muscle cramps - Primary    Hydrate and monitor      Relevant Orders   CBC   Magnesium    I have discontinued Gail Lester's COVID-19 mRNA bivalent vaccine [employer]). I am also having her maintain her aspirin, multivitamin, CALCIUM PO, Krill Oil, Vitamin D3, LUTEIN PO, tiZANidine, metFORMIN, rosuvastatin, losartan, glimepiride, Accu-Chek FastClix Lancet, Iron-Vitamin C (IRON 100/C PO), Accu-Chek Aviva Plus, Accu-Chek Softclix Lancets, and Accu-Chek Aviva Plus.  Meds ordered this encounter  Medications   DISCONTD: ACCU-CHEK AVIVA PLUS test strip    Sig: Use as instructed    Dispense:   100 each    Refill:  11   DISCONTD: Blood Glucose Monitoring Suppl (ACCU-CHEK AVIVA PLUS) w/Device KIT    Sig: Use as directed    Dispense:  1 kit    Refill:  0    DX Code E11.9   DISCONTD: Accu-Chek Softclix Lancets lancets    Sig: Use as instructed    Dispense:  100 each    Refill:  12   ACCU-CHEK AVIVA PLUS test strip    Sig: Use as instructed    Dispense:  100 each    Refill:  11   Accu-Chek Softclix Lancets lancets    Sig: Use as instructed    Dispense:  100 each  Refill:  12   Blood Glucose Monitoring Suppl (ACCU-CHEK AVIVA PLUS) w/Device KIT    Sig: Use as directed    Dispense:  1 kit    Refill:  0    DX Code E11.9     Penni Homans, MD

## 2021-12-04 NOTE — Assessment & Plan Note (Addendum)
Patient encouraged to maintain heart healthy diet, regular exercise, adequate sleep. Consider daily probiotics. Take medications as prescribed. Labs ordered and reviewed. Has aged out of colonoscopies, paps and MGM was last done in April of 2021, can repeat every 1-2 years and her discretion and encouraged to continue Dexa scans every few years

## 2021-12-04 NOTE — Assessment & Plan Note (Signed)
Hydrate and monitor 

## 2021-12-04 NOTE — Assessment & Plan Note (Signed)
Encouraged to consider an OTC hearing aide

## 2021-12-27 ENCOUNTER — Ambulatory Visit: Payer: Medicare Other | Admitting: Family Medicine

## 2021-12-27 ENCOUNTER — Other Ambulatory Visit: Payer: Self-pay

## 2021-12-27 VITALS — BP 130/82 | HR 71 | Ht 64.0 in | Wt 153.6 lb

## 2021-12-27 DIAGNOSIS — M25561 Pain in right knee: Secondary | ICD-10-CM | POA: Diagnosis not present

## 2021-12-27 NOTE — Patient Instructions (Addendum)
Thank you for coming in today.   Check back as needed 

## 2021-12-27 NOTE — Progress Notes (Signed)
° °  I, Peterson Lombard, LAT, ATC acting as a scribe for Lynne Leader, MD.  Gail Lester is a 81 y.o. female who presents to [place] at [facility] today for f/u R knee pain thought to be due to DJD exacerbation. Pt was last seen by Dr. Georgina Snell on 11/08/21 and was given a R knee steroid injection and was advised to use Voltaren gel and work on quad strengthening exercises. Today, pt reports R knee is doing remarkably well. Pt has been taking IBU, which she finds helpful. Pt has some questions about what type of arthritis she has. Pt notes she has a small focal area of the anterior-medial aspect of the R knee. Pt is also interested in how her L knee looks.  Her left knee does not bother her.  Dx imaging: 11/09/21 R knee XR   Pertinent review of systems: No fevers or chills  Relevant historical information: Diabetes   Exam:  BP 130/82    Pulse 71    Ht 5\' 4"  (1.626 m)    Wt 153 lb 9.6 oz (69.7 kg)    SpO2 97%    BMI 26.37 kg/m  General: Well Developed, well nourished, and in no acute distress.   MSK: Right knee normal-appearing Mildly tender palpation medial joint line normal motion    Lab and Radiology Results EXAM: RIGHT KNEE 3 VIEWS   COMPARISON:  None.   FINDINGS: No acute fracture or dislocation. Small joint effusion. Joint spaces are preserved. Bone mineralization is normal. Soft tissues are unremarkable.   IMPRESSION: 1. Small joint effusion. No acute osseous abnormality or significant degenerative changes.     Electronically Signed   By: Titus Dubin M.D.   On: 11/10/2021 10:03   I, Lynne Leader, personally (independently) visualized and performed the interpretation of the images attached in this note.     Assessment and Plan: 81 y.o. 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  Doing well with steroid injection and home exercise program.  Plan to continue conservative management  strategies and recheck as needed.  Can repeat steroid injection as needed in the future.  Discussed that certainly I could x-ray her left knee although she is asymptomatic and generally we recommend not x-raying body parts that do not hurt in situations like this.  She agrees watchful waiting for now.   Discussed warning signs or symptoms. Please see discharge instructions. Patient expresses understanding.   The above documentation has been reviewed and is accurate and complete Lynne Leader, M.D.  Total encounter time 20 minutes including face-to-face time with the patient and, reviewing past medical record, and charting on the date of service.   Discussed in treatment plan and options going forward.  Also discussed exercise plan.

## 2022-01-10 ENCOUNTER — Ambulatory Visit (INDEPENDENT_AMBULATORY_CARE_PROVIDER_SITE_OTHER): Payer: Medicare Other | Admitting: Pharmacist

## 2022-01-10 VITALS — BP 138/70 | HR 74

## 2022-01-10 DIAGNOSIS — M8580 Other specified disorders of bone density and structure, unspecified site: Secondary | ICD-10-CM

## 2022-01-10 DIAGNOSIS — E782 Mixed hyperlipidemia: Secondary | ICD-10-CM

## 2022-01-10 DIAGNOSIS — E119 Type 2 diabetes mellitus without complications: Secondary | ICD-10-CM

## 2022-01-10 NOTE — Patient Instructions (Signed)
Gail Lester  For 1 week - check blood glucose twice a day On January 25th - decrease dose to glimepiride to take just 1 4mg  tablet each morning. Continue to check blood glucose twice a day  If you have any questions or concerns, please feel free to contact me either at the phone number below or with a MyChart message.   Keep up the good work!  [name], PharmD Clinical Pharmacist [facility] [phone] (direct line)  [phone] (main office number)  CARE PLAN ENTRY (see longitudinal plan of care for additional care plan information)  Current Barriers:  Chronic Disease Management support, education, and care coordination needs related to Hyperlipidemia, Diabetes, Osteopenia   Hypertension Screening BP Readings from Last 3 Encounters:  12/27/21 130/82  12/01/21 124/62  11/08/21 132/80   Pharmacist Clinical Goal(s): Over the next 180 days, patient will work with PharmD and providers to maintain BP goal <140/90 Current regimen:  Losartan 25mg  daily Interventions: Discussed blood pressure goals Patient self care activities - Over the next 180 days, patient will: Maintain hypertension medication regimen.  Check blood pressure if you feel "swimmy headed"/ dizziness  Hyperlipidemia Lipid Panel     Component Value Date/Time   CHOL 145 04/10/2021 1412   TRIG 236.0 (H) 04/10/2021 1412   HDL 50.10 04/10/2021 1412   CHOLHDL 3 04/10/2021 1412   VLDL 47.2 (H) 04/10/2021 1412   LDLDIRECT 83.0 04/10/2021 1412   Pharmacist Clinical Goal(s): Over the next 180 days, patient will work with PharmD and providers to maintain LDL goal < 100 and attain Triglycerides <150 Current regimen:  Rosuvastatin 10mg  - take 1 tablet daily (helps to lower LDL / "bad cholesterol") Krill Oil 500mg  - take 1 tablet daily (helps to lower triglycerides) Interventions: Discussed recent lipid results and goals Dicussed diet and exercise Patient self care activities  - Over the next 180 days, patient will: Maintain cholesterol medication regimen.  Continue to follow diet rich in vegetables and low in saturated and trans fat (limiting animal fat / products and processed foods)   Diabetes Lab Results  Component Value Date/Time   HGBA1C 7.9 (H) 11/07/2021 04:28 PM   HGBA1C 7.5 (H) 04/10/2021 02:12 PM   Pharmacist Clinical Goal(s): Over the next 180 days, patient will work with PharmD and providers to achieve A1c goal 7.5% Current regimen:  Glimepiride 4mg  twice daily with morning and and 2mg  = (0.5 tablet) with evening meals Metformin XR 500mg  daily Interventions: Discussed goals for diabetes Monitor for low blood glucose - less than 70. Reviewed how to identify and treat hypoglycemia Patient self care activities - Over the next 180 days, patient will: Check blood sugar twice daily, document, and provide at future appointments (take each morning and each evening) Contact provider with increased frequency of hypoglycemia (more than once per week) Continue current regimen for diabetes. Consider using weekly pill container to help with remembering to take medications   Osteopenia Pharmacist Clinical Goal(s) Over the next 180 days, patient will work with PharmD and providers to reduce risk of fracture due to osteopenia Current regimen:  Calcium citrate 315mg  - take 2 tablets - 630mg  daily Vitamin D 2000 units daily Interventions: Discussed intake of 1200mg  of calcium daily through diet and/or supplementation Discussed intake of [phone] units of vitamin D through supplementation  Consider repeat DEXA Scan Patient self care activities - Over the next 180 days, patient will: Continue to take current calcium and vitamin D supplements Continue to walk daily -  helps to keep bones health Practice fall prevention techniques Consider repeat DEXA Scan  Medication management Pharmacist Clinical Goal(s): Over the next 180 days, patient will work with  PharmD and providers to achieve optimal medication adherence Current pharmacy: [facility] Interventions Comprehensive medication review performed. Discussed medication adherence strategies.  Patient self care activities - Over the next 180 days, patient will: Focus on medication adherence by filling and taking medications appropriately  Take medications as prescribed Report any questions or concerns to PharmD and/or provider(s)   The patient verbalized understanding of instructions, educational materials, and care plan provided today and agreed to receive a mailed copy of patient instructions, educational materials, and care plan.

## 2022-01-14 NOTE — Chronic Care Management (AMB) (Signed)
Chronic Care Management Pharmacy Note  01/14/2022 Name:  Gail Lester MRN:  [id number] DOB:  12/29/1940  Summary:  Still having some blood glucose readings in am around 70's; A1c increased to 7.9% which make me suspect she might be having higher blood glucose reading after meals or at night. Recommended she check blood glucose twice a day for the next 2 weeks. Will have her hold evening glimepiride the 2nd week to see if lows improve. Would like to try to maximize metformin dose before glimepiride if able due to less incidence of hypoglycemia with metformin but patient reports she tried higher doses of metformin and she dropped too low.   Plan: F/U in 2 weeks to review home blood glucose and assess for hypoglycemia/hyperglcemia  Subjective: Gail Lester is an 81 y.o. year old female who is a primary patient of Mosie Lukes, MD.  The CCM team was consulted for assistance with disease management and care coordination needs.    Engaged with patient face to face for  to discuss medication adherence  in response to provider referral for pharmacy case management and/or care coordination services.   Consent to Services:  The patient was given information about Chronic Care Management services, agreed to services, and gave verbal consent prior to initiation of services.  Please see initial visit note for detailed documentation.   Patient Care Team: Mosie Lukes, MD as PCP - General (Family Medicine) Sharyne Peach, MD as Consulting Physician (Ophthalmology) Martinique, Amy, MD as Consulting Physician (Dermatology) [name], [place] (Pharmacist)  Recent office visits: 12/01/2021 - Fam Med (Dr Charlett Blake) Preventative visit / physical.  11/07/21-Stacey A. Blyth,MD (PCP) Seen for knee pain. 04/10/2021 - PCP (Dr Charlett Blake) - f/u chronic conditions; changed rosuvastatin from 29m 1/2 tablet daily to 177mdaily - new Rx sent in; Prescribed Cortisporin Otic Soln - 3 drops in the right eat tid;  Osteopenia - Encouraged to get adequate exercise, calcium and vitamin d intake. Sacral pain - Notes persistent pain since a fall caused a sacral fracture 2 years ago. Likely arthritic, try Voltaren gel and lidocaine gel.  Recent consult visits: 12/27/2021 - Sports Med (Dr CoGeorgina SnellSeen for right knee pain. No med changes noted.  11/28/20 Neurology MeHazle CocaPHD. Mild Neurocognitive. No medications started / changed  Hospital visits: None in previous 6 months  Objective:  Lab Results  Component Value Date   CREATININE 0.66 11/07/2021   CREATININE 0.70 04/10/2021   CREATININE 0.62 10/06/2020    Lab Results  Component Value Date   HGBA1C 7.9 (H) 11/07/2021   Last diabetic Eye exam:  Lab Results  Component Value Date/Time   HMDIABEYEEXA No Retinopathy 08/22/2021 12:00 AM    Last diabetic Foot exam:  Lab Results  Component Value Date/Time   HMDIABFOOTEX done; normal 08/24/2012 12:00 AM        Component Value Date/Time   CHOL 162 11/07/2021 1628   TRIG 135.0 11/07/2021 1628   HDL 62.40 11/07/2021 1628   CHOLHDL 3 11/07/2021 1628   VLDL 27.0 11/07/2021 1628   LDLCALC 72 11/07/2021 1628   LDLCALC 71 10/06/2020 1423   LDLDIRECT 83.0 04/10/2021 1412    Hepatic Function Latest Ref Rng & Units 11/07/2021 04/10/2021 10/06/2020  Total Protein 6.0 - 8.3 g/dL 6.9 6.8 6.6  Albumin 3.5 - 5.2 g/dL 4.3 3.9 -  AST 0 - 37 U/L '21 23 24  ' ALT 0 - 35 U/L '15 18 17  ' Alk Phosphatase 39 - 117 U/L  50 53 -  Total Bilirubin 0.2 - 1.2 mg/dL 0.4 0.4 0.5  Bilirubin, Direct 0.0 - 0.3 mg/dL - - -    Lab Results  Component Value Date/Time   TSH 2.58 11/07/2021 04:28 PM   TSH 2.92 04/10/2021 02:12 PM    CBC Latest Ref Rng & Units 11/07/2021 04/10/2021 10/06/2020  WBC 4.0 - 10.5 K/uL 7.9 7.3 6.8  Hemoglobin 12.0 - 15.0 g/dL 12.3 12.9 12.9  Hematocrit 36.0 - 46.0 % 37.9 39.0 38.1  Platelets 150.0 - 400.0 K/uL 290.0 303.0 275    Lab Results  Component Value Date/Time   VD25OH 58.47  11/03/2015 08:32 AM   VD25OH 79 11/24/2012 10:38 AM    Clinical ASCVD: No  The ASCVD Risk score (Arnett DK, et al., 2019) failed to calculate for the following reasons:   The 2019 ASCVD risk score is only valid for ages 61 to 22     Social History   Tobacco Use  Smoking Status Former   Types: Cigarettes   Quit date: 11/17/1989   Years since quitting: 32.1  Smokeless Tobacco Never   BP Readings from Last 3 Encounters:  01/14/22 138/70  12/27/21 130/82  12/01/21 124/62   Pulse Readings from Last 3 Encounters:  01/14/22 74  12/27/21 71  12/01/21 69   Wt Readings from Last 3 Encounters:  12/27/21 153 lb 9.6 oz (69.7 kg)  12/01/21 152 lb 9.6 oz (69.2 kg)  11/08/21 157 lb 9.6 oz (71.5 kg)    Assessment: Review of patient past medical history, allergies, medications, health status, including review of consultants reports, laboratory and other test data, was performed as part of comprehensive evaluation and provision of chronic care management services.   SDOH:  (Social Determinants of Health) assessments and interventions performed:  SDOH Interventions    Flowsheet Row Most Recent Value  SDOH Interventions   Financial Strain Interventions Intervention Not Indicated  Physical Activity Interventions Intervention Not Indicated  Transportation Interventions Intervention Not Indicated        CCM Care Plan  Allergies  Allergen Reactions   Ramipril Diarrhea   Lipitor [Atorvastatin]     Diarrhea, muscle cramps   Lisinopril     myalgias    Medications Reviewed Today     Reviewed by [name], RPH-CPP (Pharmacist) on 01/10/22 at [place] List Status: <None>   Medication Order Taking? Sig Documenting Provider Last Dose Status Informant  ACCU-CHEK AVIVA PLUS test strip [id number] Yes Use as instructed Mosie Lukes, MD Taking Active   Accu-Chek Softclix Lancets lancets [id number] Yes Use as instructed Mosie Lukes, MD Taking Active   aspirin 81 MG tablet  [id number] Yes Take 81 mg by mouth daily. [provider] Taking Active   Blood Glucose Monitoring Suppl (ACCU-CHEK AVIVA PLUS) w/Device KIT [id number] Yes Use as directed Mosie Lukes, MD Taking Active   CALCIUM PO [id number] Yes Take 630 mg by mouth daily. Taking 2 tablets of calcium citrate 315 each - 631m total [provider] Taking Active   Cholecalciferol (VITAMIN D3) 50 MCG (2000 UT) TABS [id number] Take 1 tablet by mouth daily. [provider] Taking Active   Co-Enzyme Q10 100 MG CAPS [id number] Take 1 capsule by mouth daily. [provider] Taking Active   glimepiride (AMARYL) 4 MG tablet [id number] Take 1 tablet by mouth every morning and 1/2 tablet in the evening (4 mg each morning and 2 mg each evening) BMosie Lukes MD Taking Active  Iron-Vitamin C (IRON 100/C PO) [id number] Yes Take by mouth. [provider] Taking Active   Krill Oil 500 MG CAPS [id number] Yes Take 1 capsule by mouth daily. [provider] Taking Active   Lancets Misc. (ACCU-CHEK FASTCLIX LANCET) KIT [id number] Yes Use to check sugar 4 times a day.  Dx code: E11.9 Mosie Lukes, MD Taking Active   losartan (COZAAR) 25 MG tablet [id number] Yes TAKE 1 TABLET BY MOUTH  DAILY Mosie Lukes, MD Taking Active   LUTEIN PO [id number] Yes Take 25 mg by mouth daily. [provider] Taking Active            Med Note Norva Pavlov Nov 08, 2020  7:48 PM) For eye health, recommended by ophthalmologist  metFORMIN (GLUCOPHAGE-XR) 500 MG 24 hr tablet [id number] Yes TAKE 1 TABLET BY MOUTH  DAILY Mosie Lukes, MD Taking Active   Multiple Vitamin (MULTIVITAMIN) tablet [id number] Yes Take 1 tablet by mouth daily.  [provider] Taking Active   rosuvastatin (CRESTOR) 10 MG tablet [id number] Yes Take 1 tablet (10 mg total) by mouth daily. Mosie Lukes, MD Taking Active   tiZANidine (ZANAFLEX) 2 MG tablet [id number] No Take 0.5-2 tablets (1-4 mg  total) by mouth every 6 (six) hours as needed for muscle spasms.  Patient not taking: Reported on 01/10/2022   Mosie Lukes, MD Not Taking Active   vitamin C (ASCORBIC ACID) 500 MG tablet [id number] Yes Take 500 mg by mouth daily. [provider] Taking Active   Med List Note Sheldon Silvan, CPhT 12/08/11 1422): Patient uses [employer], [employer].             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 08/14/2015   Rotator cuff arthropathy 11/24/2014   Dysplastic nevus 10/11/2014   Seborrheic keratosis 09/28/2014   Melanocytic nevi, unspecified 09/28/2014   Hemangioma 09/28/2014   Overweight 06/14/2014   Diarrhea 02/10/2013   Type 2 diabetes mellitus ([place]) 02/10/2013   Hyperlipidemia, mixed 02/10/2013   Fibrocystic breast 02/10/2013   Dupuytren's contracture of both hands 02/10/2013    Immunization History  Administered Date(s) Administered   Fluad Quad(high Dose 65+) 08/28/2019, 10/04/2020, 09/14/2021   Influenza, High Dose Seasonal PF 09/11/2016, 10/03/2017, 08/26/2018   Influenza,inj,Quad PF,6+ Mos 09/02/2013, 08/31/2014, 09/21/2015   PFIZER Comirnaty(Gray Top)Covid-19 Tri-Sucrose Vaccine 04/06/2021   PFIZER(Purple Top)SARS-COV-2 Vaccination 01/28/2020, 02/22/2020, 09/18/2020   Pfizer Covid-19 Vaccine Bivalent Booster 33yr & up 09/15/2021   Pneumococcal Conjugate-13 02/04/2014   Pneumococcal Polysaccharide-23 01/12/2007   Tdap 01/13/2008, 10/06/2019   Zoster Recombinat  (Shingrix) 08/05/2018, 10/01/2018   Zoster, Live 01/12/2007    Conditions to be addressed/monitored: HLD, DMII and osteopenia; pain in sacral area with h/o fracure; mild congnitive impairment  Care Plan : General Pharmacy (Adult)  Updates made by ECherre Robins RPH-CPP since 01/14/2022 12:00 AM     Problem: Medication management and chronic disease management support, education and care coordination need related to HDL, T2DM, osteopenia, sacral pain  Priority: Medium  Onset Date: 04/11/2021  Note:   Current Barriers:  Unable to achieve control of elevated triglycerides   Chronic Disease Management support, education, and care coordination needs related to Hyperlipidemia, sacral pain; Diabetes, Osteopenia  Pharmacist Clinical Goal(s):  Over the next 180 days, patient will achieve adherence to monitoring guidelines and medication adherence to achieve therapeutic efficacy achieve control of Triglycerides and pain as evidenced by Tg <150 and reporting improved sacral pain maintain control of diabetes and LDL as evidenced by A1c 7.5% or less and LDL <100  through collaboration with PharmD and provider.   Interventions: 1:1 collaboration with Mosie Lukes, MD regarding development and update of comprehensive plan of care as evidenced by provider attestation and co-signature Inter-disciplinary care team collaboration (see longitudinal plan of care) Comprehensive medication review performed; medication list updated in electronic medical record  Hypertension Screening Controlled; BP goal <140/90 Current regimen:  Losartan 82m daily Interventions: Discussed blood pressure goals Maintain hypertension medication regimen.  Check blood pressure if you feel "swimmy headed"/ dizziness  Hyperlipidemia LDL at goal of <100; Tg now at goal of <150 Current regimen:  Rosuvastatin 142m- take 1 tablet daily (helps to lower LDL / "bad cholesterol") Krill Oil 50054m take 1 tablet daily (helps to  lower triglycerides) Interventions: Discussed recent lipid results and goals Dicussed diet and exercise Recommended maintain cholesterol medication regimen.  Continue to follow diet rich in vegetables and low in saturated and trans fat (limiting animal fat / products and processed foods)   Diabetes Uncontrolled; Last A1c increased;  A1c goal  < 7.5% Current regimen:  Glimepiride 4mg38mtake 1 tablet each morning and 0.5 tablet each evening (dose decreased from twice daily due to low blood glucose in morning) Metformin XR 500mg3mly Current glucose readings: 117, 74, 109, 99, 103, 125, 106 (only checks blood glucose in the morning / fasting) Per patient she tried higher doses of metformin in past but caused blood glucose to drop too low (suspect this was more likely due to combination of metformin and glimepiride)  Current meal patterns: reports that meal schedule is variable and sometimes eats small meal in evening.  Eating mostly vegetables Interventions: Reviewed home blood glucose readings and reviewed goals  Fasting blood glucose goal (before meals) = 80 to 130 Blood glucose goal after a meal = less than 180  Recommended checking blood glucose twice a day for the next week. On 01/17/2022 she will decrease glimepiride to take 1 tablet each morning. Plan to follow up in 2 weeks to review blood glucose readings. Would like to minimize glimepiride due to hypoglycemia risk and maximize metformin if needed.   Continue to monitor for low blood glucose - less than 70. Reviewed how to identify and treat hypoglycemia.   Osteopenia History of sacral fracture in 2020 Current regimen:  Calcium citrate 315mg 59mke 2 tablets - 630mg d74m Vitamin D 2000 units daily Last DEXA 11/27/2018 Forearm Radius 33% T-score =  -2.0. (was -1.6 on 11/13/2016) AP Spine L1-L2 T-Score = 1.8 (was 1.4 on 11/13/2016)  Dual Femur Total T-Score =  (was 0.3 on 11/13/2016) FRAX* 10-year Probability of Fracture Based  on femoral neck BMD: DualFemur (Right) Major Osteoporotic Fracture: 17.6% Hip Fracture:                3.3% Interventions:  Consider repeat DEXA Scan Continue to take current calcium and vitamin D supplements Continue to walk daily Discussed fall prevention  Medication management Pharmacist Clinical  Goal(s): Over the next 180 days, patient will work with PharmD and providers to achieve optimal medication adherence Current pharmacy: [facility] Interventions Comprehensive medication review performed. Continue current medication management strategy Reviewed refill history Discussed medication adherence strategies, especially for glimepiride.  Patient self care activities - Over the next 180 days, patient will: Focus on medication adherence by filling and taking medications appropriately  Take medications as prescribed Report any questions or concerns to PharmD and/or provider(s)  Patient Goals/Self-Care Activities Over the next 180 days, patient will:  take medications as prescribed, check glucose twice a day - morning and evening, document, and provide at future appointments, target a minimum of 150 minutes of moderate intensity exercise weekly, engage in dietary modifications by limiting intake of saturated and trans fat, and sugar / simple carbohydrates.   Follow Up : Telephone follow up appointment with care management team member scheduled for:  f/u with clinical pharmacist in 2 weeks.          Medication Assistance: None required.  Patient affirms current coverage meets needs.  Patient's preferred pharmacy is:  [employer] [facility] - [place], [place] [facility] [address] [place] [address] [place] [address] Phone: [phone] Fax: [place] [address] [place] [address] Phone: [phone] Fax: [phone]    Follow Up:  Patient agrees to  Care Plan and Follow-up.  Plan: Telephone follow up appointment with care management team member scheduled for:     2 weeks  [name], PharmD Clinical Pharmacist [facility] [place] [facility]

## 2022-01-23 DIAGNOSIS — E782 Mixed hyperlipidemia: Secondary | ICD-10-CM

## 2022-01-23 DIAGNOSIS — E119 Type 2 diabetes mellitus without complications: Secondary | ICD-10-CM | POA: Diagnosis not present

## 2022-01-24 ENCOUNTER — Ambulatory Visit (INDEPENDENT_AMBULATORY_CARE_PROVIDER_SITE_OTHER): Payer: Medicare Other | Admitting: Pharmacist

## 2022-01-24 VITALS — BP 124/68 | HR 67 | Wt 152.6 lb

## 2022-01-24 DIAGNOSIS — M25561 Pain in right knee: Secondary | ICD-10-CM

## 2022-01-24 DIAGNOSIS — E782 Mixed hyperlipidemia: Secondary | ICD-10-CM

## 2022-01-24 DIAGNOSIS — H9190 Unspecified hearing loss, unspecified ear: Secondary | ICD-10-CM

## 2022-01-24 DIAGNOSIS — E119 Type 2 diabetes mellitus without complications: Secondary | ICD-10-CM

## 2022-01-24 MED ORDER — GLIMEPIRIDE 4 MG PO TABS: 4.0000 mg | ORAL_TABLET | Freq: Every day | ORAL | 1 refills | Status: DC

## 2022-01-24 NOTE — Patient Instructions (Signed)
Mrs. Rommel It was a pleasure speaking with you today.  I have attached a summary of our visit today and information about your health goals.   If you have any questions or concerns, please feel free to contact me either at the phone number below or with a MyChart message.   Keep up the good work!  [name], PharmD Clinical Pharmacist [facility] [phone] (direct line)  [phone] (main office number)  CARE PLAN ENTRY (see longitudinal plan of care for additional care plan information)  Current Barriers:  Chronic Disease Management support, education, and care coordination needs related to Hyperlipidemia, Diabetes, Osteopenia   Hypertension Screening BP Readings from Last 3 Encounters:  01/14/22 138/70  12/27/21 130/82  12/01/21 124/62   Pharmacist Clinical Goal(s): Over the next 180 days, patient will work with PharmD and providers to maintain BP goal <140/90 Current regimen:  Losartan 25mg  daily Interventions: Discussed blood pressure goals Patient self care activities - Over the next 180 days, patient will: Maintain hypertension medication regimen.  Check blood pressure if you feel "swimmy headed"/ dizziness  Hyperlipidemia Lipid Panel     Component Value Date/Time   CHOL 145 04/10/2021 1412   TRIG 236.0 (H) 04/10/2021 1412   HDL 50.10 04/10/2021 1412   CHOLHDL 3 04/10/2021 1412   VLDL 47.2 (H) 04/10/2021 1412   LDLDIRECT 83.0 04/10/2021 1412   Pharmacist Clinical Goal(s): Over the next 180 days, patient will work with PharmD and providers to maintain LDL goal < 100 and attain Triglycerides <150 Current regimen:  Rosuvastatin 10mg  - take 1 tablet daily (helps to lower LDL / "bad cholesterol") Krill Oil 500mg  - take 1 tablet daily (helps to lower triglycerides) Interventions: Discussed recent lipid results and goals Dicussed diet and exercise Patient self care activities - Over the next 180 days, patient  will: Maintain cholesterol medication regimen.  Continue to follow diet rich in vegetables and low in saturated and trans fat (limiting animal fat / products and processed foods)   Diabetes Lab Results  Component Value Date/Time   HGBA1C 7.9 (H) 11/07/2021 04:28 PM   HGBA1C 7.5 (H) 04/10/2021 02:12 PM   Pharmacist Clinical Goal(s): Over the next 180 days, patient will work with PharmD and providers to achieve A1c goal 7.5% Current regimen:  Glimepiride 4mg  twice daily with morning  Metformin XR 500mg  daily Interventions: Discussed goals for diabetes Monitor for low blood glucose - less than 70. Reviewed how to identify and treat hypoglycemia Patient self care activities - Over the next 180 days, patient will: Check blood sugar twice daily at varying times of day; document, and provide at future appointments (take each morning and each evening) Contact provider with increased frequency of hypoglycemia (more than once per week) Continue current regimen for diabetes. Continue to take glimepiride 4mg  - 1 tablet with breakfast  Osteopenia Pharmacist Clinical Goal(s) Over the next 180 days, patient will work with PharmD and providers to reduce risk of fracture due to osteopenia Current regimen:  Calcium citrate 315mg  - take 2 tablets - 630mg  daily Vitamin D 2000 units daily Interventions: Discussed intake of 1200mg  of calcium daily through diet and/or supplementation Discussed intake of [phone] units of vitamin D through supplementation  Consider repeat DEXA Scan Patient self care activities - Over the next 180 days, patient will: Continue to take current calcium and vitamin D supplements Continue to walk daily - helps to keep bones health Practice fall prevention techniques Consider repeat DEXA Scan  Medication  management Pharmacist Clinical Goal(s): Over the next 180 days, patient will work with PharmD and providers to achieve optimal medication adherence Current pharmacy:  [facility] Interventions Comprehensive medication review performed. Discussed medication adherence strategies.  Patient self care activities - Over the next 180 days, patient will: Focus on medication adherence by filling and taking medications appropriately  Take medications as prescribed Report any questions or concerns to PharmD and/or provider(s)  The patient verbalized understanding of instructions, educational materials, and care plan provided today and declined offer to receive copy of patient instructions, educational materials, and care plan.

## 2022-01-28 NOTE — Chronic Care Management (AMB) (Signed)
Chronic Care Management Pharmacy Note  01/28/2022 Name:  Gail Lester MRN:  [id number] DOB:  03-01-1941  Summary:  Still having some blood glucose readings in am around 70's; A1c increased to 7.9% which make me suspect she might be having higher blood glucose reading after meals or at night. Recommended she check blood glucose twice a day for the next 2 weeks.  Consider increasing metformin, adding GLP1 or switch glimepiride to repaglinide or add acarbose.   Plan: F/U in 2 weeks to review home blood glucose and assess for hypoglycemia/hyperglcemia  Subjective: Gail Lester is an 81 y.o. year old female who is a primary patient of Mosie Lukes, MD.  The CCM team was consulted for assistance with disease management and care coordination needs.    Engaged with patient face to face for  to discuss medication adherence and type 2 DM  in response to provider referral for pharmacy case management and/or care coordination services.   Consent to Services:  The patient was given information about Chronic Care Management services, agreed to services, and gave verbal consent prior to initiation of services.  Please see initial visit note for detailed documentation.   Patient Care Team: Mosie Lukes, MD as PCP - General (Family Medicine) Sharyne Peach, MD as Consulting Physician (Ophthalmology) Martinique, Amy, MD as Consulting Physician (Dermatology) [name], [place] (Pharmacist)  Recent office visits: 12/01/2021 - Fam Med (Dr Charlett Blake) Preventative visit / physical.  11/07/21-Stacey A. Blyth,MD (PCP) Seen for knee pain. 04/10/2021 - PCP (Dr Charlett Blake) - f/u chronic conditions; changed rosuvastatin from 70m 1/2 tablet daily to 174mdaily - new Rx sent in; Prescribed Cortisporin Otic Soln - 3 drops in the right eat tid; Osteopenia - Encouraged to get adequate exercise, calcium and vitamin d intake. Sacral pain - Notes persistent pain since a fall caused a sacral fracture 2 years ago. Likely  arthritic, try Voltaren gel and lidocaine gel.  Recent consult visits: 12/27/2021 - Sports Med (Dr CoGeorgina SnellSeen for right knee pain. No med changes noted.  11/28/20 Neurology MeHazle CocaPHD. Mild Neurocognitive. No medications started / changed  Hospital visits: None in previous 6 months  Objective:  Lab Results  Component Value Date   CREATININE 0.66 11/07/2021   CREATININE 0.70 04/10/2021   CREATININE 0.62 10/06/2020    Lab Results  Component Value Date   HGBA1C 7.9 (H) 11/07/2021   Last diabetic Eye exam:  Lab Results  Component Value Date/Time   HMDIABEYEEXA No Retinopathy 08/22/2021 12:00 AM    Last diabetic Foot exam:  Lab Results  Component Value Date/Time   HMDIABFOOTEX done; normal 08/24/2012 12:00 AM        Component Value Date/Time   CHOL 162 11/07/2021 1628   TRIG 135.0 11/07/2021 1628   HDL 62.40 11/07/2021 1628   CHOLHDL 3 11/07/2021 1628   VLDL 27.0 11/07/2021 1628   LDLCALC 72 11/07/2021 1628   LDLCALC 71 10/06/2020 1423   LDLDIRECT 83.0 04/10/2021 1412    Hepatic Function Latest Ref Rng & Units 11/07/2021 04/10/2021 10/06/2020  Total Protein 6.0 - 8.3 g/dL 6.9 6.8 6.6  Albumin 3.5 - 5.2 g/dL 4.3 3.9 -  AST 0 - 37 U/L _0 ALT 0 - 35 U/L _1 Alk Phosphatase 39 - 117 U/L 50 53 -  Total Bilirubin 0.2 - 1.2 mg/dL 0.4 0.4 0.5  Bilirubin, Direct 0.0 - 0.3 mg/dL - - -    Lab Results  Component  Value Date/Time   TSH 2.58 11/07/2021 04:28 PM   TSH 2.92 04/10/2021 02:12 PM    CBC Latest Ref Rng & Units 11/07/2021 04/10/2021 10/06/2020  WBC 4.0 - 10.5 K/uL 7.9 7.3 6.8  Hemoglobin 12.0 - 15.0 g/dL 12.3 12.9 12.9  Hematocrit 36.0 - 46.0 % 37.9 39.0 38.1  Platelets 150.0 - 400.0 K/uL 290.0 303.0 275    Lab Results  Component Value Date/Time   VD25OH 58.47 11/03/2015 08:32 AM   VD25OH 79 11/24/2012 10:38 AM    Clinical ASCVD: No  The ASCVD Risk score (Arnett DK, et al., 2019) failed to calculate for the following reasons:   The  2019 ASCVD risk score is only valid for ages 61 to 89     Social History   Tobacco Use  Smoking Status Former   Types: Cigarettes   Quit date: 11/17/1989   Years since quitting: 32.2  Smokeless Tobacco Never   BP Readings from Last 3 Encounters:  01/24/22 124/68  01/14/22 138/70  12/27/21 130/82   Pulse Readings from Last 3 Encounters:  01/24/22 67  01/14/22 74  12/27/21 71   Wt Readings from Last 3 Encounters:  01/24/22 152 lb 9.6 oz (69.2 kg)  12/27/21 153 lb 9.6 oz (69.7 kg)  12/01/21 152 lb 9.6 oz (69.2 kg)    Assessment: Review of patient past medical history, allergies, medications, health status, including review of consultants reports, laboratory and other test data, was performed as part of comprehensive evaluation and provision of chronic care management services.   SDOH:  (Social Determinants of Health) assessments and interventions performed:      CCM Care Plan  Allergies  Allergen Reactions   Ramipril Diarrhea   Lipitor [Atorvastatin]     Diarrhea, muscle cramps   Lisinopril     myalgias    Medications Reviewed Today     Reviewed by [name], RPH-CPP (Pharmacist) on 01/28/22 at 1525  Med List Status: <None>   Medication Order Taking? Sig Documenting Provider Last Dose Status Informant  ACCU-CHEK AVIVA PLUS test strip [id number] Yes Use as instructed Mosie Lukes, MD Taking Active   Accu-Chek Softclix Lancets lancets [id number] Yes Use as instructed Mosie Lukes, MD Taking Active   aspirin 81 MG tablet [id number] Yes Take 81 mg by mouth daily. [provider] Taking Active   Blood Glucose Monitoring Suppl (ACCU-CHEK AVIVA PLUS) w/Device KIT [id number] Yes Use as directed Mosie Lukes, MD Taking Active   CALCIUM PO [id number] Yes Take 630 mg by mouth daily. Taking 2 tablets of calcium citrate 315 each - 669m total [provider] Taking Active   Cholecalciferol (VITAMIN D3) 50 MCG (2000 UT) TABS [id number] Take 1 tablet  by mouth daily. [provider] Taking Active   Co-Enzyme Q10 100 MG CAPS [id number] Take 1 capsule by mouth daily. [provider] Taking Active   glimepiride (AMARYL) 4 MG tablet [id number] Take 1 tablet (4 mg total) by mouth daily with breakfast. BMosie Lukes MD  Active   glucosamine-chondroitin 500-400 MG tablet [id number] Take 1 tablet by mouth daily. [provider] Taking Active   Iron-Vitamin C (IRON 100/C PO) [id number] Take 1 tablet by mouth daily. [provider] Taking Active   Krill Oil 500 MG CAPS [id number] Take 1 capsule by mouth daily. [provider] Taking Active   Lancets Misc. (ACCU-CHEK FASTCLIX LANCET) KIT [id number] Use to check sugar 4 times a day.  Dx  code: E11.9 Mosie Lukes, MD Taking Active   losartan (COZAAR) 25 MG tablet [id number] Yes TAKE 1 TABLET BY MOUTH  DAILY Mosie Lukes, MD Taking Active   LUTEIN PO [id number] Yes Take 25 mg by mouth daily. [provider] Taking Active            Med Note Norva Pavlov Nov 08, 2020  7:48 PM) For eye health, recommended by ophthalmologist  metFORMIN (GLUCOPHAGE-XR) 500 MG 24 hr tablet [id number] Yes TAKE 1 TABLET BY MOUTH  DAILY Mosie Lukes, MD Taking Active   Multiple Vitamin (MULTIVITAMIN) tablet [id number] Yes Take 1 tablet by mouth daily.  [provider] Taking Active   rosuvastatin (CRESTOR) 10 MG tablet [id number] Yes Take 1 tablet (10 mg total) by mouth daily. Mosie Lukes, MD Taking Active   tiZANidine (ZANAFLEX) 2 MG tablet [id number] Yes Take 0.5-2 tablets (1-4 mg total) by mouth every 6 (six) hours as needed for muscle spasms. Mosie Lukes, MD Taking Active   vitamin C (ASCORBIC ACID) 500 MG tablet [id number] Yes Take 500 mg by mouth daily. [provider] Taking Active   Med List Note Sheldon Silvan, CPhT 12/08/11 1422): Patient uses [employer], [employer].             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 08/14/2015   Rotator cuff arthropathy 11/24/2014   Dysplastic nevus 10/11/2014   Seborrheic keratosis 09/28/2014   Melanocytic nevi, unspecified 09/28/2014   Hemangioma 09/28/2014   Overweight 06/14/2014   Diarrhea 02/10/2013   Type 2 diabetes mellitus ([place]) 02/10/2013   Hyperlipidemia, mixed 02/10/2013   Fibrocystic breast 02/10/2013   Dupuytren's contracture of both hands 02/10/2013    Immunization History  Administered Date(s) Administered   Fluad Quad(high Dose 65+) 08/28/2019, 10/04/2020, 09/14/2021   Influenza, High Dose Seasonal PF 09/11/2016, 10/03/2017, 08/26/2018   Influenza,inj,Quad PF,6+ Mos 09/02/2013, 08/31/2014, 09/21/2015   PFIZER Comirnaty(Gray Top)Covid-19 Tri-Sucrose Vaccine 04/06/2021   PFIZER(Purple Top)SARS-COV-2 Vaccination 01/28/2020, 02/22/2020, 09/18/2020   Pfizer Covid-19 Vaccine Bivalent Booster 61yr & up 09/15/2021   Pneumococcal Conjugate-13 02/04/2014   Pneumococcal Polysaccharide-23 01/12/2007   Tdap 01/13/2008, 10/06/2019   Zoster Recombinat (Shingrix) 08/05/2018, 10/01/2018   Zoster, Live 01/12/2007    Conditions to be addressed/monitored: HLD, DMII and osteopenia; pain in sacral area with h/o fracure; mild congnitive impairment  Care Plan : General Pharmacy (Adult)  Updates made by ECherre Robins RPH-CPP since 01/28/2022 12:00 AM     Problem: Medication management and chronic disease management support, education and care coordination need related to  HDL, T2DM, osteopenia, sacral pain   Priority: Medium  Onset Date: 04/11/2021  Note:   Current Barriers:  Unable to achieve control of elevated triglycerides   Chronic Disease Management support, education, and care coordination needs related to Hyperlipidemia, sacral pain; Diabetes, Osteopenia  Pharmacist Clinical Goal(s):  Over the next 180 days, patient will achieve adherence to monitoring guidelines and medication adherence to achieve therapeutic efficacy achieve control of  Triglycerides and pain as evidenced by Tg <150 and reporting improved sacral pain maintain control of diabetes and LDL as evidenced by A1c 7.5% or less and LDL <100  through collaboration with PharmD and provider.   Interventions: 1:1 collaboration with Mosie Lukes, MD regarding development and update of comprehensive plan of care as evidenced by provider attestation and co-signature Inter-disciplinary care team collaboration (see longitudinal plan of care) Comprehensive medication review performed; medication list updated in electronic medical record  Hypertension Screening Controlled; BP goal <140/90 Current regimen:  Losartan 79m daily Interventions: Discussed blood pressure goals Maintain hypertension medication regimen.  Check blood pressure if you feel "swimmy headed"/ dizziness  Hyperlipidemia LDL at goal of <100; Tg now at goal of <150 Current regimen:  Rosuvastatin 180m- take 1 tablet daily (helps to lower LDL / "bad cholesterol") Krill Oil 50085m take 1 tablet daily (helps to lower triglycerides) Interventions: Discussed recent lipid results and goals Dicussed diet and exercise Recommended maintain cholesterol medication regimen.  Continue to follow diet rich in vegetables and low in saturated and trans fat (limiting animal fat / products and processed foods)   Diabetes Uncontrolled; Last A1c increased;  A1c goal  < 7.5% Current regimen:  Glimepiride 4mg93mtake 1 tablet each morning   Metformin XR 500mg69mly Current glucose readings:  Fasting in am:  81, 69, 84, 109, 112, 72, 115, 108, 70 (taking 4mg g63mepiride in am and 2mg wi53mevening meal) - avg = 91 Fasting in am after stopped evening dose of glimepiride): 118, 104, 100, 90, 96, 86 - avg = 99 After evening meal: 270 (ate late with dessert - came down to 157 after exercise), 132, 82, 252 (ate late), 83, 160, 82 - avg = 152 After evening meal after stopped evening dose of glimepiride): 210, 248, 146, 143, 101, 230 - avg = 180 Per patient she tried higher doses of metformin in past but caused blood glucose to drop too low (suspect this was more likely due to combination of metformin and glimepiride)  Current meal patterns: reports that meal schedule is variable and sometimes eats small meal in evening.  Eating mostly vegetables Interventions: Reviewed home blood glucose readings and reviewed goals  Fasting blood glucose goal (before meals) = 80 to 130 Blood glucose goal after a meal = less than 180  Recommended checking blood glucose twice a day at varying times of day Assessed for trial of Libre 3 (has sample in office) for 14 days only but patient's phone was not compatible with sensor.  Continue to monitor for low blood glucose - less than 70. Reviewed how to identify and treat hypoglycemia.   Osteopenia History of sacral fracture in 2020 Current regimen:  Calcium citrate 315mg - 51m 2 tablets - 630mg dai20mitamin D 2000 units daily Last DEXA 11/27/2018 Forearm Radius 33% T-score =  -2.0. (was -1.6 on 11/13/2016) AP Spine L1-L2 T-Score = 1.8 (was 1.4 on 11/13/2016)  Dual Femur Total T-Score =  (was 0.3 on 11/13/2016) FRAX* 10-year Probability of Fracture Based on femoral neck BMD: DualFemur (Right) Major Osteoporotic Fracture: 17.6% Hip Fracture:                3.3% Interventions:  Consider repeat DEXA Scan Continue to take current calcium and vitamin D supplements Continue to walk daily Discussed fall  prevention  Medication management Pharmacist Clinical Goal(s): Over the next 180 days, patient will work with PharmD and providers to achieve optimal medication adherence Current pharmacy:  [facility] Interventions Comprehensive medication review performed. Continue current medication management strategy Reviewed refill history Discussed medication adherence strategies, especially for glimepiride.  Patient self care activities - Over the next 180 days, patient will: Focus on medication adherence by filling and taking medications appropriately  Take medications as prescribed Report any questions or concerns to PharmD and/or provider(s)  Patient Goals/Self-Care Activities Over the next 180 days, patient will:  take medications as prescribed, check glucose twice a day - morning and evening, document, and provide at future appointments, target a minimum of 150 minutes of moderate intensity exercise weekly, engage in dietary modifications by limiting intake of saturated and trans fat, and sugar / simple carbohydrates.   Follow Up : Face to Face appointment with care management team member scheduled for: 2 weeks        Medication Assistance: None required.  Patient affirms current coverage meets needs.  Patient's preferred pharmacy is:  [facility] - [place], [place] [facility] [address] [place] [address] [place] [address] Phone: [phone] Fax: [place] [address] [place] [address] Phone: [phone] Fax: [phone]    Follow Up:  Patient agrees to Care Plan and Follow-up.  Plan: Face to Face appointment with care management team member scheduled for: 2 weeks   [name], PharmD Clinical Pharmacist [facility]

## 2022-02-07 ENCOUNTER — Ambulatory Visit: Payer: Medicare Other | Admitting: Pharmacist

## 2022-02-07 DIAGNOSIS — E119 Type 2 diabetes mellitus without complications: Secondary | ICD-10-CM

## 2022-02-07 DIAGNOSIS — E782 Mixed hyperlipidemia: Secondary | ICD-10-CM

## 2022-02-07 MED ORDER — LOSARTAN POTASSIUM 25 MG PO TABS: 25.0000 mg | ORAL_TABLET | Freq: Every day | ORAL | 3 refills | Status: DC

## 2022-02-07 NOTE — Patient Instructions (Addendum)
Gail Lester It was a pleasure speaking with you today.   If you have any questions or concerns, please feel free to contact me either at the phone number below or with a MyChart message.    [name], PharmD Clinical Pharmacist [place] [phone] (direct line)  [phone] (main office number)   The patient verbalized understanding of instructions, educational materials, and care plan provided today and declined offer to receive copy of patient instructions, educational materials, and care plan.    Goal Blood glucose:    Fasting (before meals) = 80 to 130   Within 2 hours of eating = less than 180   Goal is to have 20 grams or less of carbohydrates for snacks and 50 grams or less of carbohydrates per meal.  Try to have no more than 3 of these foods per meal:  Fruit:   1/2 cup or once piece (baseball size)- apples, pears, pineapple, peaches, oranges  1 cup - berries, melons  1/2 banana or grapefruit  Stachy Vegetables:   1/2 cup potatoes (white or sweet), corn, peas  Other starches:   1 piece of bread  1/2 cup rice or pasta  4 inch pancake    These foods you can eat more freely: Proteins:   Fish  Chicken or [place]  Beef or pork (1 or 2 servings per week)  Eggs  Nuts (peanuts, walnuts, almonds, pistachios)  Cheese  Non starchy vegetables:  Green beans  Broccoli or cauliflower  Lettuce, greens, cabbage  Brussel Sprout  Carrots  Onions and peppers  Celery  Tomatoes  Asparagus  Eggplant  Cucumbers  Squash and Zucchini

## 2022-02-08 NOTE — Chronic Care Management (AMB) (Signed)
Chronic Care Management Pharmacy Note  02/08/2022 Name:  Gail Lester MRN:  [id number] DOB:  10/01/1941  Summary:  Still having some blood glucose readings in am that are low. Most recent A1c increased to 7.9%.  Current glucose readings:  Fasting in am over the last 2 weeks: 86, 92, 78, 51, 90, 114, 109, 100, 82, 100, 94, 98, 84, 80 (average = 89.8) After evening meal: 270, 132, 82, 252 (ate late), 83, 160, 82 - (avg = 152) After evening meal over the last 2 weeks: 171, 125, 256, 286, 221, 265, 244, 278, 140, 173, 197 ,143, 262, 224 (average = 201) Discussed adding another agent or increasing metformin for post prandial highs (could consider pioglitazone, DPP4, SGLT2, GLP1 or Meglitinides). Patient declined additional therapy today. She would like to work on diet. Discussed limiting snack to 20 grams of carbohydrates or less and limiting meals to 45 grams of carbohydrates or less. Reviewed recommended serving sizes for starchy vegetable, fruits.  Continue to monitor for low blood glucose - less than 70. Reviewed how to identify and treat hypoglycemia. Recommended she check blood glucose twice a day for the next 2 weeks.    Plan: F/U in 2 months to review home blood glucose and assess for hypoglycemia/hyperglcemia  Subjective: Gail Lester is an 81 y.o. year old female who is a primary patient of Mosie Lukes, MD.  The CCM team was consulted for assistance with disease management and care coordination needs.    Engaged with patient face to face for  to discuss medication adherence and type 2 DM  in response to provider referral for pharmacy case management and/or care coordination services.   Consent to Services:  The patient was given information about Chronic Care Management services, agreed to services, and gave verbal consent prior to initiation of services.  Please see initial visit note for detailed documentation.   Patient Care Team: Mosie Lukes, MD as PCP - General  (Family Medicine) Sharyne Peach, MD as Consulting Physician (Ophthalmology) Martinique, Amy, MD as Consulting Physician (Dermatology) [name], [place] (Pharmacist)  Recent office visits: 12/01/2021 - Fam Med (Dr Charlett Blake) Preventative visit / physical.  11/07/21-Stacey A. Blyth,MD (PCP) Seen for knee pain. 04/10/2021 - PCP (Dr Charlett Blake) - f/u chronic conditions; changed rosuvastatin from 7m 1/2 tablet daily to 116mdaily - new Rx sent in; Prescribed Cortisporin Otic Soln - 3 drops in the right eat tid; Osteopenia - Encouraged to get adequate exercise, calcium and vitamin d intake. Sacral pain - Notes persistent pain since a fall caused a sacral fracture 2 years ago. Likely arthritic, try Voltaren gel and lidocaine gel.  Recent consult visits: 12/27/2021 - Sports Med (Dr CoGeorgina SnellSeen for right knee pain. No med changes noted.  11/28/20 Neurology MeHazle CocaPHD. Mild Neurocognitive. No medications started / changed  Hospital visits: None in previous 6 months  Objective:  Lab Results  Component Value Date   CREATININE 0.66 11/07/2021   CREATININE 0.70 04/10/2021   CREATININE 0.62 10/06/2020    Lab Results  Component Value Date   HGBA1C 7.9 (H) 11/07/2021   Last diabetic Eye exam:  Lab Results  Component Value Date/Time   HMDIABEYEEXA No Retinopathy 08/22/2021 12:00 AM    Last diabetic Foot exam:  Lab Results  Component Value Date/Time   HMDIABFOOTEX done; normal 08/24/2012 12:00 AM        Component Value Date/Time   CHOL 162 11/07/2021 1628   TRIG 135.0 11/07/2021 1628  HDL 62.40 11/07/2021 1628   CHOLHDL 3 11/07/2021 1628   VLDL 27.0 11/07/2021 1628   LDLCALC 72 11/07/2021 1628   LDLCALC 71 10/06/2020 1423   LDLDIRECT 83.0 04/10/2021 1412    Hepatic Function Latest Ref Rng & Units 11/07/2021 04/10/2021 10/06/2020  Total Protein 6.0 - 8.3 g/dL 6.9 6.8 6.6  Albumin 3.5 - 5.2 g/dL 4.3 3.9 -  AST 0 - 37 U/L _0 ALT 0 - 35 U/L _1 Alk Phosphatase 39 -  117 U/L 50 53 -  Total Bilirubin 0.2 - 1.2 mg/dL 0.4 0.4 0.5  Bilirubin, Direct 0.0 - 0.3 mg/dL - - -    Lab Results  Component Value Date/Time   TSH 2.58 11/07/2021 04:28 PM   TSH 2.92 04/10/2021 02:12 PM    CBC Latest Ref Rng & Units 11/07/2021 04/10/2021 10/06/2020  WBC 4.0 - 10.5 K/uL 7.9 7.3 6.8  Hemoglobin 12.0 - 15.0 g/dL 12.3 12.9 12.9  Hematocrit 36.0 - 46.0 % 37.9 39.0 38.1  Platelets 150.0 - 400.0 K/uL 290.0 303.0 275    Lab Results  Component Value Date/Time   VD25OH 58.47 11/03/2015 08:32 AM   VD25OH 79 11/24/2012 10:38 AM    Clinical ASCVD: No  The ASCVD Risk score (Arnett DK, et al., 2019) failed to calculate for the following reasons:   The 2019 ASCVD risk score is only valid for ages 34 to 48     Social History   Tobacco Use  Smoking Status Former   Types: Cigarettes   Quit date: 11/17/1989   Years since quitting: 32.2  Smokeless Tobacco Never   BP Readings from Last 3 Encounters:  01/24/22 124/68  01/14/22 138/70  12/27/21 130/82   Pulse Readings from Last 3 Encounters:  01/24/22 67  01/14/22 74  12/27/21 71   Wt Readings from Last 3 Encounters:  01/24/22 152 lb 9.6 oz (69.2 kg)  12/27/21 153 lb 9.6 oz (69.7 kg)  12/01/21 152 lb 9.6 oz (69.2 kg)    Assessment: Review of patient past medical history, allergies, medications, health status, including review of consultants reports, laboratory and other test data, was performed as part of comprehensive evaluation and provision of chronic care management services.   SDOH:  (Social Determinants of Health) assessments and interventions performed:      CCM Care Plan  Allergies  Allergen Reactions   Ramipril Diarrhea   Lipitor [Atorvastatin]     Diarrhea, muscle cramps   Lisinopril     myalgias    Medications Reviewed Today     Reviewed by [name], RPH-CPP (Pharmacist) on 02/07/22 at 104  Med List Status: <None>   Medication Order Taking? Sig Documenting Provider Last Dose  Status Informant  ACCU-CHEK AVIVA PLUS test strip [id number] Yes Use as instructed Mosie Lukes, MD Taking Active   Accu-Chek Softclix Lancets lancets [id number] Yes Use as instructed Mosie Lukes, MD Taking Active   aspirin 81 MG tablet [id number] Yes Take 81 mg by mouth daily. [provider] Taking Active   Blood Glucose Monitoring Suppl (ACCU-CHEK AVIVA PLUS) w/Device KIT [id number] Yes Use as directed Mosie Lukes, MD Taking Active   CALCIUM PO [id number] Yes Take 630 mg by mouth daily. Taking 2 tablets of calcium citrate 315 each - 640m total [provider] Taking Active   Cholecalciferol (VITAMIN D3) 50 MCG (2000 UT) TABS [id number] Take 1 tablet by mouth daily. [provider] Taking Active   Co-Enzyme Q10  100 MG CAPS [id number] Yes Take 1 capsule by mouth daily. [provider] Taking Active   glimepiride (AMARYL) 4 MG tablet [id number] Yes Take 1 tablet (4 mg total) by mouth daily with breakfast. Mosie Lukes, MD Taking Active   glucosamine-chondroitin 500-400 MG tablet [id number] Yes Take 1 tablet by mouth daily. [provider] Taking Active   Iron-Vitamin C (IRON 100/C PO) [id number] Yes Take 1 tablet by mouth daily. [provider] Taking Active   Krill Oil 500 MG CAPS [id number] Yes Take 1 capsule by mouth daily. [provider] Taking Active   Lancets Misc. (ACCU-CHEK FASTCLIX LANCET) KIT [id number] Yes Use to check sugar 4 times a day.  Dx code: E11.9 Mosie Lukes, MD Taking Active   losartan (COZAAR) 25 MG tablet [id number] Yes TAKE 1 TABLET BY MOUTH  DAILY Mosie Lukes, MD Taking Active   LUTEIN PO [id number] Yes Take 25 mg by mouth daily. [provider] Taking Active            Med Note Norva Pavlov Nov 08, 2020  7:48 PM) For eye health, recommended by ophthalmologist  metFORMIN (GLUCOPHAGE-XR) 500 MG 24 hr tablet [id number] Yes TAKE 1 TABLET BY MOUTH  DAILY Mosie Lukes, MD Taking Active    Multiple Vitamin (MULTIVITAMIN) tablet [id number] Yes Take 1 tablet by mouth daily.  [provider] Taking Active   rosuvastatin (CRESTOR) 10 MG tablet [id number] Yes Take 1 tablet (10 mg total) by mouth daily. Mosie Lukes, MD Taking Active   tiZANidine (ZANAFLEX) 2 MG tablet [id number] Yes Take 0.5-2 tablets (1-4 mg total) by mouth every 6 (six) hours as needed for muscle spasms. Mosie Lukes, MD Taking Active   vitamin C (ASCORBIC ACID) 500 MG tablet [id number] Yes Take 500 mg by mouth daily. [provider] Taking Active   Med List Note Sheldon Silvan, CPhT 12/08/11 1422): Patient uses [employer], [employer].             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 08/14/2015   Rotator cuff arthropathy 11/24/2014   Dysplastic nevus 10/11/2014   Seborrheic keratosis 09/28/2014   Melanocytic nevi, unspecified 09/28/2014   Hemangioma 09/28/2014   Overweight 06/14/2014   Diarrhea 02/10/2013   Type 2 diabetes mellitus ([place]) 02/10/2013   Hyperlipidemia, mixed 02/10/2013   Fibrocystic breast 02/10/2013   Dupuytren's contracture of both hands 02/10/2013    Immunization History  Administered Date(s) Administered   Fluad Quad(high Dose 65+) 08/28/2019, 10/04/2020, 09/14/2021   Influenza, High Dose Seasonal PF 09/11/2016, 10/03/2017, 08/26/2018   Influenza,inj,Quad PF,6+ Mos 09/02/2013, 08/31/2014, 09/21/2015   PFIZER Comirnaty(Gray Top)Covid-19 Tri-Sucrose Vaccine 04/06/2021    PFIZER(Purple Top)SARS-COV-2 Vaccination 01/28/2020, 02/22/2020, 09/18/2020   Pfizer Covid-19 Vaccine Bivalent Booster 44yr & up 09/15/2021   Pneumococcal Conjugate-13 02/04/2014   Pneumococcal Polysaccharide-23 01/12/2007   Tdap 01/13/2008, 10/06/2019   Zoster Recombinat (Shingrix) 08/05/2018, 10/01/2018   Zoster, Live 01/12/2007    Conditions to be addressed/monitored: HLD, DMII  and osteopenia; pain in sacral area with h/o fracure; mild congnitive impairment  Care Plan : General Pharmacy (Adult)  Updates made by [name], RPH-CPP since 02/08/2022 12:00 AM     Problem: Medication management and chronic disease management support, education and care coordination need related to HDL, T2DM, osteopenia, sacral pain   Priority: Medium  Onset Date: 04/11/2021  Note:   Current Barriers:  Unable to achieve control of elevated triglycerides   Chronic Disease Management support, education, and care coordination needs related to Hyperlipidemia, sacral pain; Diabetes, Osteopenia A1c not at goal  Pharmacist Clinical Goal(s):  Over the next 180 days, patient will achieve adherence to monitoring guidelines and medication adherence to achieve therapeutic efficacy achieve control of Triglycerides and diabetes as evidenced by Tg <150 and A1c < 7.5% maintain control of  LDL as evidenced by  LDL <100  through collaboration with PharmD and provider.   Interventions: 1:1 collaboration with Mosie Lukes, MD regarding development and update of comprehensive plan of care as evidenced by provider attestation and co-signature Inter-disciplinary care team collaboration (see longitudinal plan of care) Comprehensive medication review performed; medication list updated in electronic medical record  Hypertension Screening Controlled; BP goal <140/90 Current regimen:  Losartan 88m daily Interventions: Discussed blood pressure goals Maintain hypertension medication regimen.  Check blood pressure if  you feel "swimmy headed"/ dizziness  Hyperlipidemia LDL at goal of <100; Tg now at goal of <150 Current regimen:  Rosuvastatin 136m- take 1 tablet daily (helps to lower LDL / "bad cholesterol") Krill Oil 50051m take 1 tablet daily (helps to lower triglycerides) Interventions: Discussed recent lipid goals Dicussed diet and exercise Recommended maintain cholesterol medication regimen.  Continue to follow diet rich in vegetables and low in saturated and trans fat (limiting animal fat / products and processed foods)   Diabetes Uncontrolled; Last A1c increased;  A1c goal  < 7.5% Current regimen:  Glimepiride 4mg22mtake 1 tablet each morning  Metformin XR 500mg81mly Current glucose readings:  Fasting in am over the last 2 weeks: 86, 92, 78, 51, 90, 114, 109, 100, 82, 100, 94, 98, 84, 80 (average = 89.8) After evening meal: 270, 132, 82, 252 (ate late), 83, 160, 82 - (avg = 152) After evening meal over the last 2 weeks: 171, 125, 256, 286, 221, 265, 244, 278, 140, 173, 197 ,143, 262, 224 (average = 201) Per patient she tried higher doses of metformin in past but caused blood glucose to drop too low (suspect this was more likely due to combination of metformin and glimepiride)  Current meal patterns: reports that meal schedule is variable and sometimes eats small meal in evening.  Eating mostly vegetables Assessed for trial of Libre 3 (has sample in office) for 14 days only in January 2023 but patient's phone was not compatible with sensor.  Interventions: Reviewed home blood glucose readings and reviewed goals  Fasting blood glucose goal (before meals) = 80 to 130 Blood glucose goal after a meal = less than 180  Discussed adding another agent or increasing metformin for post prandial highs (could consider pioglitazone, DPP4, SGLT2, GLP1 or Meglitinides). However patient declined additional therapy today. She would like to work on diet. Discussed limiting snack to 20 grams of carbohydrates  or less and limiting meals to 45 grams of carbohydrates or less. Reviewed recommended serving sizes for starchy vegetable, fruits.  Continue to monitor for low blood glucose - less than 70. Reviewed how to identify and treat hypoglycemia.  Osteopenia History of sacral fracture in 2020 Current regimen:  Calcium citrate 38m - take 2 tablets - 6353mdaily Vitamin D 2000 units daily Last DEXA 11/27/2018 Forearm Radius 33% T-score =  -2.0. (was -1.6 on 11/13/2016) AP Spine L1-L2 T-Score = 1.8 (was 1.4 on 11/13/2016)  Dual Femur Total T-Score =  (was 0.3 on 11/13/2016) FRAX* 10-year Probability of Fracture Based on femoral neck BMD: DualFemur (Right) Major Osteoporotic Fracture: 17.6% Hip Fracture:                3.3% Interventions: (addressed at previous visit) Consider repeat DEXA Scan Continue to take current calcium and vitamin D supplements Continue to walk daily Discussed fall prevention  Medication management Pharmacist Clinical Goal(s): Over the next 180 days, patient will work with PharmD and providers to achieve optimal medication adherence Current pharmacy: [facility] Interventions Comprehensive medication review performed. Continue current medication management strategy Reviewed refill history Discussed medication adherence strategies, especially for glimepiride.  Patient self care activities - Over the next 180 days, patient will: Focus on medication adherence by filling and taking medications appropriately  Take medications as prescribed Report any questions or concerns to PharmD and/or provider(s)  Patient Goals/Self-Care Activities Over the next 180 days, patient will:  take medications as prescribed, check glucose twice a day - morning and evening, document, and provide at future appointments, target a minimum of 150 minutes of moderate intensity exercise weekly, engage in dietary modifications by limiting intake of saturated and trans fat, and sugar /  simple carbohydrates.   Follow Up : Face to Face appointment with care management team member scheduled for: 2 weeks        Medication Assistance: None required.  Patient affirms current coverage meets needs.  Patient's preferred pharmacy is:  [facility] [facility] Ca[place]hone: [phone]: 80[place] [address]NCE[address] HartshornRLake Mont[place]elloC[place]hone: [phone]: [phone]  Follow Up:  Patient agrees to Care Plan and Follow-up.  Plan: Face to Face appointment with care management team member scheduled for: 2 weeks   TaCherre RobinsPharmD Clinical Pharmacist [facility] CityiS[facility]

## 2022-02-09 ENCOUNTER — Other Ambulatory Visit: Payer: Self-pay | Admitting: Family Medicine

## 2022-02-20 DIAGNOSIS — E782 Mixed hyperlipidemia: Secondary | ICD-10-CM

## 2022-02-20 DIAGNOSIS — E119 Type 2 diabetes mellitus without complications: Secondary | ICD-10-CM

## 2022-04-11 ENCOUNTER — Ambulatory Visit (INDEPENDENT_AMBULATORY_CARE_PROVIDER_SITE_OTHER): Payer: Medicare Other | Admitting: Pharmacist

## 2022-04-11 VITALS — BP 130/60 | Wt 152.8 lb

## 2022-04-11 DIAGNOSIS — E119 Type 2 diabetes mellitus without complications: Secondary | ICD-10-CM

## 2022-04-11 DIAGNOSIS — E782 Mixed hyperlipidemia: Secondary | ICD-10-CM

## 2022-04-11 NOTE — Patient Instructions (Signed)
Gail Lester ?It was a pleasure speaking with you  ?Below is a summary of your health goals and care plan ? ?If you have any questions or concerns, please feel free to contact me either at the phone number below or with a MyChart message.  ? ?Keep up the good work! ? ?[name], PharmD ?Clinical Pharmacist ?[facility] ?[place] ?[phone] (direct line)  ?[phone] (main office number) ? ? ?Chronic Care Management Care Plan ?Hypertension Screening ?BP Readings from Last 3 Encounters:  ?04/11/22 130/60  ?01/24/22 124/68  ?01/14/22 138/70  ? ?Pharmacist Clinical Goal(s): ?Over the next 180 days, patient will work with PharmD and providers to maintain BP goal <140/90 ?Current regimen:  ?Losartan '25mg'$  daily ?Interventions: ?Discussed blood pressure goals ?Patient self care activities - Over the next 180 days, patient will: ?Maintain hypertension medication regimen.  ?Check blood pressure if you feel "swimmy headed"/ dizziness ? ?Hyperlipidemia ?Lipid Panel  ?   ?Component Value Date/Time  ? CHOL 145 04/10/2021 1412  ? TRIG 236.0 (H) 04/10/2021 1412  ? HDL 50.10 04/10/2021 1412  ? CHOLHDL 3 04/10/2021 1412  ? VLDL 47.2 (H) 04/10/2021 1412  ? LDLDIRECT 83.0 04/10/2021 1412  ? ?Pharmacist Clinical Goal(s): ?Over the next 180 days, patient will work with PharmD and providers to maintain LDL goal < 100 and attain Triglycerides <150 ?Current regimen:  ?Rosuvastatin '10mg'$  - take 1 tablet daily (helps to lower LDL / "bad cholesterol") ?Krill Oil '500mg'$  - take 1 tablet daily (helps to lower triglycerides) ?Interventions: ?Discussed recent lipid results and goals ?Dicussed diet and exercise ?Patient self care activities - Over the next 180 days, patient will: ?Maintain cholesterol medication regimen.  ?Continue to follow diet rich in vegetables and low in saturated and trans fat (limiting animal fat / products and processed foods)  ? ?Diabetes ?Lab Results  ?Component Value Date/Time  ? HGBA1C 7.9 (H)  11/07/2021 04:28 PM  ? HGBA1C 7.5 (H) 04/10/2021 02:12 PM  ? ?Pharmacist Clinical Goal(s): ?Over the next 180 days, patient will work with PharmD and providers to achieve A1c goal 7.5% ?Current regimen:  ?Glimepiride '4mg'$  twice daily with morning  ?Metformin XR '500mg'$  daily ?Interventions: ?Discussed goals for diabetes ?Monitor for low blood glucose - less than 70. Reviewed how to identify and treat hypoglycemia ?Reviewed diet recommendation and carbohydrate intake goals ?Patient self care activities - Over the next 180 days, patient will: ?Check blood sugar twice daily at varying times of day; document, and provide at future appointments (take each morning and each evening) ?Home blood glucose readings: ?Fasting blood glucose goal (before meals) = 80 to 130 ?Blood glucose goal after a meal = less than 180  ?Contact provider with increased frequency of hypoglycemia (more than once per week) ?Continue current regimen for diabetes. ?Continue to take glimepiride '4mg'$  - 1 tablet with breakfast and metformin ER '500mg'$  daily  ? ?Osteopenia ?Pharmacist Clinical Goal(s) ?Over the next 180 days, patient will work with PharmD and providers to reduce risk of fracture due to osteopenia ?Current regimen:  ?Calcium citrate '315mg'$  - take 2 tablets - '630mg'$  daily ?Vitamin D 2000 units daily ?Interventions: ?Discussed intake of '1200mg'$  of calcium daily through diet and/or supplementation ?Discussed intake of [phone] units of vitamin D through supplementation  ?Consider repeat DEXA Scan ?Patient self care activities - Over the next 180 days, patient will: ?Continue to take current calcium and vitamin D supplements ?Continue to walk daily - helps to keep bones health ?Practice fall prevention techniques ?Consider  repeat DEXA Scan ? ?Medication management ?Pharmacist Clinical Goal(s): ?Over the next 180 days, patient will work with PharmD and providers to achieve optimal medication adherence ?Current pharmacy: [facility] ?Interventions ?Comprehensive medication review performed. ?Discussed medication adherence strategies.  ?Patient self care activities - Over the next 180 days, patient will: ?Focus on medication adherence by filling and taking medications appropriately  ?Take medications as prescribed ?Report any questions or concerns to PharmD and/or provider(s) ? ?The patient verbalized understanding of instructions, educational materials, and care plan provided today and agreed to receive a mailed copy of patient instructions, educational materials, and care plan.   ?

## 2022-04-11 NOTE — Chronic Care Management (AMB) (Signed)
? ? ?Chronic Care Management ?Pharmacy Note ? ?04/11/2022 ?Name:  Gail Lester MRN:  [id number] DOB:  01-28-1941 ? ?Summary:  ?Still having some blood glucose readings in pm that are above goal but mostly related to dietary indiscretions. Incidence of hypoglycemia has improved since Fall of 2022 when dose of glimepiride was lowered. Most recent A1c increased to 7.9% (11/07/2021) ?Current glucose readings:  ?Fasting in am: 96, 99, 92, 90, 83, 84, 99, 92, 132, 82, 99, 83 (Avg = 95) ?Evening: 162, 174, 117, 329 (drank soda), 172, 164, 207 (ice cream), 279 (ice cream), 67, 186, 308 (ate late), 127, 153, 133, 141, 205, 75, 233, 138, 196, 278 (cookie), 213, 221 (bread and dessert) (Avg = 186) ?Discussed adding another agent or increasing metformin for post prandial highs (could consider pioglitazone, DPP4, SGLT2, GLP1 or Meglitinides). Patient declined additional therapy at last visit. Patient wants to wait until next A1c checked and she would like to work on diet. Reviewed diet - limiting snack to 20 grams of carbohydrates or less and limiting meals to 45 grams of carbohydrates or less. Reviewed recommended serving sizes for starchy vegetable, fruits.  ?Continue to monitor for low blood glucose - less than 70. Reviewed how to identify and treat hypoglycemia. Recommended she check blood glucose twice a day for the next 2 weeks.   ? ?Plan: F/U in 2 months with PCP - will check in after June appointment with Dr Charlett Blake. ? ?Subjective: ?Gail Lester is an 81 y.o. year old female who is a primary patient of Mosie Lukes, MD.  The CCM team was consulted for assistance with disease management and care coordination needs.   ? ?Engaged with patient face to face for  to discuss medication adherence and type 2 DM  in response to provider referral for pharmacy case management and/or care coordination services.  ? ?Consent to Services:  ?The patient was given information about Chronic Care Management services, agreed to services,  and gave verbal consent prior to initiation of services.  Please see initial visit note for detailed documentation.  ? ?Patient Care Team: ?Mosie Lukes, MD as PCP - General (Family Medicine) ?Sharyne Peach, MD as Consulting Physician (Ophthalmology) ?[place], Amy, MD as Consulting Physician (Dermatology) ?[name], RPH-CPP (Pharmacist) ? ?Recent office visits: ?12/01/2021 - Fam Med (Dr Charlett Blake) Preventative visit / physical.  ?11/07/21-Stacey A. Blyth,MD (PCP) Seen for knee pain. ?04/10/2021 - PCP (Dr Charlett Blake) - f/u chronic conditions; changed rosuvastatin from 42m 1/2 tablet daily to 130mdaily - new Rx sent in; Prescribed Cortisporin Otic Soln - 3 drops in the right eat tid; Osteopenia - Encouraged to get adequate exercise, calcium and vitamin d intake. Sacral pain - Notes persistent pain since a fall caused a sacral fracture 2 years ago. Likely arthritic, try Voltaren gel and lidocaine gel. ? ?Recent consult visits: ?12/27/2021 - Sports Med (Dr CoGeorgina SnellSeen for right knee pain. No med changes noted.  ?11/28/20 Neurology MeHazle CocaPHD. Mild Neurocognitive. No medications started / changed ? ?Hospital visits: ?None in previous 6 months ? ?Objective: ? ?Lab Results  ?Component Value Date  ? CREATININE 0.66 11/07/2021  ? CREATININE 0.70 04/10/2021  ? CREATININE 0.62 10/06/2020  ? ? ?Lab Results  ?Component Value Date  ? HGBA1C 7.9 (H) 11/07/2021  ? ?Last diabetic Eye exam:  ?Lab Results  ?Component Value Date/Time  ? HMDIABEYEEXA No Retinopathy 08/22/2021 12:00 AM  ?  ?Last diabetic Foot exam:  ?Lab Results  ?Component Value Date/Time  ?  HMDIABFOOTEX done; normal 08/24/2012 12:00 AM  ?  ? ?   ?Component Value Date/Time  ? CHOL 162 11/07/2021 1628  ? TRIG 135.0 11/07/2021 1628  ? HDL 62.40 11/07/2021 1628  ? CHOLHDL 3 11/07/2021 1628  ? VLDL 27.0 11/07/2021 1628  ? [place] 72 11/07/2021 1628  ? [place] 71 10/06/2020 1423  ? LDLDIRECT 83.0 04/10/2021 1412  ? ? ? ?  Latest Ref Rng & Units 11/07/2021  ?  4:28 PM  04/10/2021  ?  2:12 PM 10/06/2020  ?  2:23 PM  ?Hepatic Function  ?Total Protein 6.0 - 8.3 g/dL 6.9   6.8   6.6    ?Albumin 3.5 - 5.2 g/dL 4.3   3.9     ?AST 0 - 37 U/L '21   23   24    ' ?ALT 0 - 35 U/L '15   18   17    ' ?Alk Phosphatase 39 - 117 U/L 50   53     ?Total Bilirubin 0.2 - 1.2 mg/dL 0.4   0.4   0.5    ? ? ?Lab Results  ?Component Value Date/Time  ? TSH 2.58 11/07/2021 04:28 PM  ? TSH 2.92 04/10/2021 02:12 PM  ? ? ? ?  Latest Ref Rng & Units 11/07/2021  ?  4:28 PM 04/10/2021  ?  2:12 PM 10/06/2020  ?  2:23 PM  ?CBC  ?WBC 4.0 - 10.5 K/uL 7.9   7.3   6.8    ?Hemoglobin 12.0 - 15.0 g/dL 12.3   12.9   12.9    ?Hematocrit 36.0 - 46.0 % 37.9   39.0   38.1    ?Platelets 150.0 - 400.0 K/uL 290.0   303.0   275    ? ? ?Lab Results  ?Component Value Date/Time  ? VD25OH 58.47 11/03/2015 08:32 AM  ? VD25OH 79 11/24/2012 10:38 AM  ? ? ?Clinical ASCVD: No  ?The ASCVD Risk score (Arnett DK, et al., 2019) failed to calculate for the following reasons: ?  The 2019 ASCVD risk score is only valid for ages 25 to 64   ? ? ?Social History  ? ?Tobacco Use  ?Smoking Status Former  ? Types: Cigarettes  ? Quit date: 11/17/1989  ? Years since quitting: 32.4  ?Smokeless Tobacco Never  ? ?BP Readings from Last 3 Encounters:  ?04/11/22 130/60  ?01/24/22 124/68  ?01/14/22 138/70  ? ?Pulse Readings from Last 3 Encounters:  ?01/24/22 67  ?01/14/22 74  ?12/27/21 71  ? ?Wt Readings from Last 3 Encounters:  ?04/11/22 152 lb 12.8 oz (69.3 kg)  ?01/24/22 152 lb 9.6 oz (69.2 kg)  ?12/27/21 153 lb 9.6 oz (69.7 kg)  ? ? ?Assessment: Review of patient past medical history, allergies, medications, health status, including review of consultants reports, laboratory and other test data, was performed as part of comprehensive evaluation and provision of chronic care management services.  ? ?SDOH:  (Social Determinants of Health) assessments and interventions performed:  ? ? ? ? ?[place] ? ?Allergies  ?Allergen Reactions  ? Ramipril Diarrhea  ? Lipitor  [Atorvastatin]   ?  Diarrhea, muscle cramps  ? Lisinopril   ?  myalgias  ? ? ?Medications Reviewed Today   ? ? Reviewed by [name], RPH-CPP (Pharmacist) on 04/11/22 at 61  Med List Status: <None>  ? ?Medication Order Taking? Sig Documenting Provider Last Dose Status Informant  ?ACCU-CHEK AVIVA PLUS test strip [id number] Yes Use as instructed Mosie Lukes, MD Taking  Active   ?Accu-Chek Softclix Lancets lancets [id number] Yes Use as instructed Mosie Lukes, MD Taking Active   ?Apoaequorin (PREVAGEN PO) [id number] Yes Take 1 tablet by mouth daily. [provider] Taking Active   ?aspirin 81 MG tablet [id number] Yes Take 81 mg by mouth daily. [provider] Taking Active   ?Blood Glucose Monitoring Suppl (ACCU-CHEK AVIVA PLUS) w/Device KIT [id number] Yes Use as directed Mosie Lukes, MD Taking Active   ?CALCIUM PO [id number] Yes Take 630 mg by mouth daily. Taking 2 tablets of calcium citrate 315 each - 646m total [provider] Taking Active   ?Cholecalciferol (VITAMIN D3) 50 MCG (2000 UT) TABS [id number] Take 1 tablet by mouth daily. [provider] Taking Active   ?Co-Enzyme Q10 100 MG CAPS [id number] Take 1 capsule by mouth daily. [provider] Taking Active   ?glimepiride (AMARYL) 4 MG tablet [id number] Take 1 tablet (4 mg total) by mouth daily with breakfast. BMosie Lukes MD Taking Active   ?glucosamine-chondroitin 500-400 MG tablet [id number] Take 1 tablet by mouth daily. [provider] Taking Active   ?Iron-Vitamin C (IRON 100/C PO) [id number] Take 1 tablet by mouth daily. [provider] Taking Active   ?Krill Oil 500 MG CAPS [id number] Take 1 capsule by mouth daily. [provider] Taking Active   ?Lancets Misc. (ACCU-CHEK FASTCLIX LANCET) KIT [id number] Use to check sugar 4 times a day.  Dx code: E11.9 BMosie Lukes MD Taking Active   ?losartan (COZAAR) 25 MG tablet [id number] Take 1 tablet (25  mg total) by mouth daily. BMosie Lukes MD Taking Active   ?LUTEIN PO [id number] Take 25 mg by mouth daily. [provider] Taking Active   ?         ?Med Note (Norva PavlovNov 1

## 2022-04-12 ENCOUNTER — Ambulatory Visit: Payer: Medicare Other

## 2022-04-13 ENCOUNTER — Ambulatory Visit: Payer: Medicare Other

## 2022-04-16 ENCOUNTER — Ambulatory Visit (INDEPENDENT_AMBULATORY_CARE_PROVIDER_SITE_OTHER): Payer: Medicare Other

## 2022-04-16 DIAGNOSIS — Z780 Asymptomatic menopausal state: Secondary | ICD-10-CM

## 2022-04-16 DIAGNOSIS — Z0000 Encounter for general adult medical examination without abnormal findings: Secondary | ICD-10-CM | POA: Diagnosis not present

## 2022-04-16 NOTE — Patient Instructions (Signed)
Gail Lester , ?Thank you for taking time to come for your Medicare Wellness Visit. I appreciate your ongoing commitment to your health goals. Please review the following plan we discussed and let me know if I can assist you in the future.  ? ?Screening recommendations/referrals: ?Colonoscopy: no longer needed ?Mammogram: no longer needed ?Bone Density: ordered 04/16/22 ?Recommended yearly ophthalmology/optometry visit for glaucoma screening and checkup ?Recommended yearly dental visit for hygiene and checkup ? ?Vaccinations: ?Influenza vaccine: up to date ?Pneumococcal vaccine: up to date ?Tdap vaccine: up to date ?Shingles vaccine: up to date   ?Covid-19:completed ? ?Advanced directives: yes, on file ? ?Conditions/risks identified: see problem list ? ?Next appointment: Follow up in one year for your annual wellness visit  ? ? ?Preventive Care 81 Years and Older, Female ?Preventive care refers to lifestyle choices and visits with your health care provider that can promote health and wellness. ?What does preventive care include? ?A yearly physical exam. This is also called an annual well check. ?Dental exams once or twice a year. ?Routine eye exams. Ask your health care provider how often you should have your eyes checked. ?Personal lifestyle choices, including: ?Daily care of your teeth and gums. ?Regular physical activity. ?Eating a healthy diet. ?Avoiding tobacco and drug use. ?Limiting alcohol use. ?Practicing safe sex. ?Taking low-dose aspirin every day. ?Taking vitamin and mineral supplements as recommended by your health care provider. ?What happens during an annual well check? ?The services and screenings done by your health care provider during your annual well check will depend on your age, overall health, lifestyle risk factors, and family history of disease. ?Counseling  ?Your health care provider may ask you questions about your: ?Alcohol use. ?Tobacco use. ?Drug use. ?Emotional well-being. ?Home and  relationship well-being. ?Sexual activity. ?Eating habits. ?History of falls. ?Memory and ability to understand (cognition). ?Work and work Statistician. ?Reproductive health. ?Screening  ?You may have the following tests or measurements: ?Height, weight, and BMI. ?Blood pressure. ?Lipid and cholesterol levels. These may be checked every 5 years, or more frequently if you are over 69 years old. ?Skin check. ?Lung cancer screening. You may have this screening every year starting at age 31 if you have a 30-pack-year history of smoking and currently smoke or have quit within the past 15 years. ?Fecal occult blood test (FOBT) of the stool. You may have this test every year starting at age 29. ?Flexible sigmoidoscopy or colonoscopy. You may have a sigmoidoscopy every 5 years or a colonoscopy every 10 years starting at age 18. ?Hepatitis C blood test. ?Hepatitis B blood test. ?Sexually transmitted disease (STD) testing. ?Diabetes screening. This is done by checking your blood sugar (glucose) after you have not eaten for a while (fasting). You may have this done every 1-3 years. ?Bone density scan. This is done to screen for osteoporosis. You may have this done starting at age 65. ?Mammogram. This may be done every 1-2 years. Talk to your health care provider about how often you should have regular mammograms. ?Talk with your health care provider about your test results, treatment options, and if necessary, the need for more tests. ?Vaccines  ?Your health care provider may recommend certain vaccines, such as: ?Influenza vaccine. This is recommended every year. ?Tetanus, diphtheria, and acellular pertussis (Tdap, Td) vaccine. You may need a Td booster every 10 years. ?Zoster vaccine. You may need this after age 68. ?Pneumococcal 13-valent conjugate (PCV13) vaccine. One dose is recommended after age 67. ?Pneumococcal polysaccharide (PPSV23) vaccine. One dose  is recommended after age 81. ?Talk to your health care provider  about which screenings and vaccines you need and how often you need them. ?This information is not intended to replace advice given to you by your health care provider. Make sure you discuss any questions you have with your health care provider. ?Document Released: 01/06/2016 Document Revised: 08/29/2016 Document Reviewed: 10/11/2015 ?Elsevier Interactive Patient Education ? 2017 [place]. ? ?Fall Prevention in the Home ?Falls can cause injuries. They can happen to people of all ages. There are many things you can do to make your home safe and to help prevent falls. ?What can I do on the outside of my home? ?Regularly fix the edges of walkways and driveways and fix any cracks. ?Remove anything that might make you trip as you walk through a door, such as a raised step or threshold. ?Trim any bushes or trees on the path to your home. ?Use bright outdoor lighting. ?Clear any walking paths of anything that might make someone trip, such as rocks or tools. ?Regularly check to see if handrails are loose or broken. Make sure that both sides of any steps have handrails. ?Any raised decks and porches should have guardrails on the edges. ?Have any leaves, snow, or ice cleared regularly. ?Use sand or salt on walking paths during winter. ?Clean up any spills in your garage right away. This includes oil or grease spills. ?What can I do in the bathroom? ?Use night lights. ?Install grab bars by the toilet and in the tub and shower. Do not use towel bars as grab bars. ?Use non-skid mats or decals in the tub or shower. ?If you need to sit down in the shower, use a plastic, non-slip stool. ?Keep the floor dry. Clean up any water that spills on the floor as soon as it happens. ?Remove soap buildup in the tub or shower regularly. ?Attach bath mats securely with double-sided non-slip rug tape. ?Do not have throw rugs and other things on the floor that can make you trip. ?What can I do in the bedroom? ?Use night lights. ?Make sure  that you have a light by your bed that is easy to reach. ?Do not use any sheets or blankets that are too big for your bed. They should not hang down onto the floor. ?Have a firm chair that has side arms. You can use this for support while you get dressed. ?Do not have throw rugs and other things on the floor that can make you trip. ?What can I do in the kitchen? ?Clean up any spills right away. ?Avoid walking on wet floors. ?Keep items that you use a lot in easy-to-reach places. ?If you need to reach something above you, use a strong step stool that has a grab bar. ?Keep electrical cords out of the way. ?Do not use floor polish or wax that makes floors slippery. If you must use wax, use non-skid floor wax. ?Do not have throw rugs and other things on the floor that can make you trip. ?What can I do with my stairs? ?Do not leave any items on the stairs. ?Make sure that there are handrails on both sides of the stairs and use them. Fix handrails that are broken or loose. Make sure that handrails are as long as the stairways. ?Check any carpeting to make sure that it is firmly attached to the stairs. Fix any carpet that is loose or worn. ?Avoid having throw rugs at the top or bottom of the stairs.  If you do have throw rugs, attach them to the floor with carpet tape. ?Make sure that you have a light switch at the top of the stairs and the bottom of the stairs. If you do not have them, ask someone to add them for you. ?What else can I do to help prevent falls? ?Wear shoes that: ?Do not have high heels. ?Have rubber bottoms. ?Are comfortable and fit you well. ?Are closed at the toe. Do not wear sandals. ?If you use a stepladder: ?Make sure that it is fully opened. Do not climb a closed stepladder. ?Make sure that both sides of the stepladder are locked into place. ?Ask someone to hold it for you, if possible. ?Clearly mark and make sure that you can see: ?Any grab bars or handrails. ?First and last steps. ?Where the edge of  each step is. ?Use tools that help you move around (mobility aids) if they are needed. These include: ?Canes. ?Walkers. ?Scooters. ?Crutches. ?Turn on the lights when you go into a dark area. Replace any light bu

## 2022-04-16 NOTE — Progress Notes (Signed)
? ?Subjective:  ? Gail Lester is a 81 y.o. female who presents for Medicare Annual (Subsequent) preventive examination. ? ?I connected with  [name] on 04/16/22 by a audio enabled telemedicine application and verified that I am speaking with the correct person using two identifiers. ? ?Patient Location: Home ? ?Provider Location: Office/Clinic ? ?I discussed the limitations of evaluation and management by telemedicine. The patient expressed understanding and agreed to proceed.  ? ?Review of Systems    ? ?Cardiac Risk Factors include: advanced age (>59mn, >>84women);diabetes mellitus;dyslipidemia ? ?   ?Objective:  ?  ?There were no vitals filed for this visit. ?There is no height or weight on file to calculate BMI. ? ? ?  04/16/2022  ? 11:03 AM 04/06/2021  ?  9:47 AM 04/05/2020  ? 10:21 AM 11/24/2018  ?  8:34 AM 11/21/2017  ?  8:58 AM 10/24/2015  ? 12:07 PM 11/08/2014  ? 10:31 AM  ?Advanced Directives  ?Does Patient Have a Medical Advance Directive? _0  Yes No  ?Type of AP[place]of [place] of facility DNR (pink MOST or yellow form);Living will [place]Living will [place]St. JoeLiving will [place]Living will [place]ampasasLiving will [place]Living will   ?Does patient want to make changes to medical advance directive?   No - Patient declined No - Patient declined  No - Patient declined   ?Copy of [place]? Yes - validated most recent copy scanned in chart (See row information) Yes - validated most recent copy scanned in chart (See row information) No - copy requested Yes - validated most recent copy scanned in chart (See row information) Yes No - copy requested   ?Would patient like information on creating a medical advance directive?       Yes - Educational materials given  ? ? ?Current Medications (verified) ?Outpatient Encounter Medications  as of 04/16/2022  ?Medication Sig  ? ACCU-CHEK AVIVA PLUS test strip Use as instructed  ? Accu-Chek Softclix Lancets lancets Use as instructed  ? Apoaequorin (PREVAGEN PO) Take 1 tablet by mouth daily.  ? aspirin 81 MG tablet Take 81 mg by mouth daily.  ? Blood Glucose Monitoring Suppl (ACCU-CHEK AVIVA PLUS) w/Device KIT Use as directed  ? CALCIUM PO Take 630 mg by mouth daily. Taking 2 tablets of calcium citrate 315 each - 6320mtotal  ? Cholecalciferol (VITAMIN D3) 50 MCG (2000 UT) TABS Take 1 tablet by mouth daily.  ? Co-Enzyme Q10 100 MG CAPS Take 1 capsule by mouth daily.  ? glimepiride (AMARYL) 4 MG tablet Take 1 tablet (4 mg total) by mouth daily with breakfast.  ? glucosamine-chondroitin 500-400 MG tablet Take 1 tablet by mouth daily.  ? Iron-Vitamin C (IRON 100/C PO) Take 1 tablet by mouth daily.  ? Krill Oil 500 MG CAPS Take 1 capsule by mouth daily.  ? Lancets Misc. (ACCU-CHEK FASTCLIX LANCET) KIT Use to check sugar 4 times a day.  Dx code: E11.9  ? losartan (COZAAR) 25 MG tablet Take 1 tablet (25 mg total) by mouth daily.  ? LUTEIN PO Take 25 mg by mouth daily.  ? metFORMIN (GLUCOPHAGE-XR) 500 MG 24 hr tablet TAKE 1 TABLET BY MOUTH  DAILY  ? Multiple Vitamin (MULTIVITAMIN) tablet Take 1 tablet by mouth daily.   ? rosuvastatin (CRESTOR) 10 MG tablet TAKE 1 TABLET BY MOUTH  DAILY  ? tiZANidine (  ZANAFLEX) 2 MG tablet Take 0.5-2 tablets (1-4 mg total) by mouth every 6 (six) hours as needed for muscle spasms.  ? vitamin C (ASCORBIC ACID) 500 MG tablet Take 500 mg by mouth daily.  ? ?No facility-administered encounter medications on file as of 04/16/2022.  ? ? ?Allergies (verified) ?Ramipril, Lipitor [atorvastatin], and Lisinopril  ? ?History: ?Past Medical History:  ?Diagnosis Date  ? Arthritis   ? Constipation   ? Diarrhea   ? Dupuytren's contracture of both hands 02/10/2013  ? Epidermal cyst 01/21/2017  ? Fibrocystic breast 02/10/2013  ? Left h/o FN biopsy   ? Ganglion cyst 11/14/2014  ? Right 2nd finger  ? GERD  (gastroesophageal reflux disease) 10/09/2020  ? Hemangioma 09/28/2014  ? History of cataracts   ? Previous surgery to remove bilaterally  ? History of chicken pox   ? as a child  ? History of colonic polyp 11/21/2017  ? History of measles   ? as a child  ? History of mumps   ? as a child  ? Hypercholesteremia   ? Hyperlipidemia, mixed 02/10/2013  ? Melanocytic nevi, unspecified 09/28/2014  ? Mild neurocognitive disorder, unclear etiology 11/21/2020  ? 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 08/14/2015  ? Onychomycosis 02/24/2017  ? Osteopenia 05/23/2017  ? Seborrheic keratosis 09/28/2014  ? Type 2 diabetes mellitus 02/10/2013  ? ?Past Surgical History:  ?Procedure Laterality Date  ? APPENDECTOMY  1955  ? BREAST BIOPSY  1985-1990  ? benign  ? EYE SURGERY Right 04/2019  ? cataract removal  ? EYE SURGERY Left 03/2020  ? cataract removal  ? ORIF WRIST FRACTURE  12/09/2011  ? Procedure: OPEN REDUCTION INTERNAL FIXATION (ORIF) WRIST FRACTURE;  Surgeon: Linna Hoff;  Location: [facility];  Service: Orthopedics;  Laterality: Left;  ? ?Family History  ?Problem Relation Age of Onset  ? Kidney disease Mother   ? Alcoholism Mother   ? Diabetes Father   ? Kidney disease Maternal Grandmother   ? Cancer Maternal Grandmother   ?     bladder  ? Cancer Maternal Aunt   ? Alzheimer's disease Maternal Aunt   ? Scoliosis Son   ? Colon cancer Other   ?     grandparent  ? ?Social History  ? ?Socioeconomic History  ? Marital status: Divorced  ?  Spouse name: Not on file  ? Number of children: Not on file  ? Years of education: 42  ? Highest education level: Some college, no degree  ?Occupational History  ? Not on file  ?Tobacco Use  ? Smoking status: Former  ?  Types: Cigarettes  ?  Quit date: 11/17/1989  ?  Years since quitting: 32.4  ? Smokeless tobacco: Never  ?Substance and Sexual Activity  ? Alcohol use: No  ?  Comment: rarely while in social settings  ? Drug use: No  ? Sexual activity: Not Currently  ?Other Topics Concern  ? Not on file  ?Social History  Narrative  ? Not on file  ? ?Social Determinants of Health  ? ?Financial Resource Strain: Low Risk   ? Difficulty of Paying Living Expenses: Not hard at all  ?Food Insecurity: No Food Insecurity  ? Worried About Charity fundraiser in the Last Year: Never true  ? Ran Out of Food in the Last Year: Never true  ?Transportation Needs: No Transportation Needs  ? Lack of Transportation (Medical): No  ? Lack of Transportation (Non-Medical): No  ?Physical Activity: Sufficiently Active  ? Days of  Exercise per Week: 7 days  ? Minutes of Exercise per Session: 30 min  ?Stress: No Stress Concern Present  ? Feeling of Stress : Not at all  ?Social Connections: Moderately Isolated  ? Frequency of Communication with Friends and Family: More than three times a week  ? Frequency of Social Gatherings with Friends and Family: Never  ? Attends Religious Services: Never  ? Active Member of Clubs or Organizations: Yes  ? Attends Archivist Meetings: Never  ? Marital Status: Divorced  ? ? ?Tobacco Counseling ?Counseling given: Not Answered ? ? ?Clinical Intake: ? ?Pre-visit preparation completed: Yes ? ?Pain : No/denies pain ? ?  ? ?Nutritional Risks: None ?Diabetes: Yes ?CBG done?: No ?Did pt. bring in CBG monitor from home?: No ? ?How often do you need to have someone help you when you read instructions, pamphlets, or other written materials from your doctor or pharmacy?: 1 - Never ? ?Diabetic?Yes ?Nutrition Risk Assessment: ? ?Has the patient had any N/V/D within the last 2 months?  No  ?Does the patient have any non-healing wounds?  No  ?Has the patient had any unintentional weight loss or weight gain?  No  ? ?Diabetes: ? ?Is the patient diabetic?  Yes  ?If diabetic, was a CBG obtained today?  No  ?Did the patient bring in their glucometer from home?  No  ?How often do you monitor your CBG's? Daily.  ? ?Financial Strains and Diabetes Management: ? ?Are you having any financial strains with the device, your supplies or your  medication? No .  ?Does the patient want to be seen by Chronic Care Management for management of their diabetes?  Yes  ?Would the patient like to be referred to a Nutritionist or for Diabetic Managemen

## 2022-04-19 ENCOUNTER — Ambulatory Visit (HOSPITAL_BASED_OUTPATIENT_CLINIC_OR_DEPARTMENT_OTHER)
Admission: RE | Admit: 2022-04-19 | Discharge: 2022-04-19 | Disposition: A | Discharge status: Home / Self Care | Payer: Medicare Other | Source: Ambulatory Visit | Attending: Family Medicine | Admitting: Family Medicine

## 2022-04-19 DIAGNOSIS — Z780 Asymptomatic menopausal state: Secondary | ICD-10-CM | POA: Diagnosis not present

## 2022-04-19 DIAGNOSIS — M85831 Other specified disorders of bone density and structure, right forearm: Secondary | ICD-10-CM | POA: Diagnosis not present

## 2022-04-22 DIAGNOSIS — E782 Mixed hyperlipidemia: Secondary | ICD-10-CM

## 2022-04-22 DIAGNOSIS — I10 Essential (primary) hypertension: Secondary | ICD-10-CM

## 2022-04-22 DIAGNOSIS — Z87891 Personal history of nicotine dependence: Secondary | ICD-10-CM | POA: Diagnosis not present

## 2022-04-22 DIAGNOSIS — Z7984 Long term (current) use of oral hypoglycemic drugs: Secondary | ICD-10-CM | POA: Diagnosis not present

## 2022-04-22 DIAGNOSIS — E119 Type 2 diabetes mellitus without complications: Secondary | ICD-10-CM

## 2022-06-04 NOTE — Progress Notes (Signed)
Subjective:    Patient ID: Gail Lester, female    DOB: 1941/12/19, 81 y.o.   MRN: [id number]  Chief Complaint  Patient presents with   Follow-up    HPI Patient is in today for a follow up on chronic medical concerns.  No recent febrile illness or hospitalizations.  She does endorse roughly 3-day.  A week or 2 ago of some intermittent left-sided chest pain.  She describes it is under the left breast and intermittent.  No associated symptoms such as palpitation, diaphoresis, nausea or vomiting.  It was not exertional in nature and had no specific pattern.  No complaints of polyuria or polydipsia and her blood sugars have been well controlled. Denies palp/SOB/HA/congestion/fevers/GI or GU c/o. Taking meds as prescribed   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   Epidermal cyst 01/21/2017   Fibrocystic breast 02/10/2013   Left h/o FN biopsy    Ganglion cyst 11/14/2014   Right 2nd finger   GERD (gastroesophageal reflux disease) 10/09/2020   Hemangioma 09/28/2014   History of cataracts    Previous surgery to remove bilaterally   History of chicken pox    as a child   History of colonic polyp 11/21/2017   History of measles    as a child   History of mumps    as a child   Hypercholesteremia    Hyperlipidemia, mixed 02/10/2013   Melanocytic nevi, unspecified 09/28/2014   Mild neurocognitive disorder, unclear etiology 11/21/2020   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 08/14/2015   Onychomycosis 02/24/2017   Osteopenia 05/23/2017   Seborrheic keratosis 09/28/2014   Type 2 diabetes mellitus 02/10/2013    Past Surgical History:  Procedure Laterality Date   APPENDECTOMY  1955   BREAST BIOPSY  1985-1990   benign   EYE SURGERY Right 04/2019   cataract removal   EYE SURGERY Left 03/2020   cataract removal   ORIF WRIST FRACTURE  12/09/2011   Procedure: OPEN REDUCTION INTERNAL FIXATION (ORIF) WRIST  FRACTURE;  Surgeon: Linna Hoff;  Location: [facility];  Service: Orthopedics;  Laterality: Left;    Family History  Problem Relation Age of Onset   Kidney disease Mother    Alcoholism Mother    Diabetes Father    Kidney disease Maternal Grandmother    Cancer Maternal Grandmother        bladder   Cancer Maternal Aunt    Alzheimer's disease Maternal Aunt    Scoliosis Son    Colon cancer Other        grandparent    Social History   Socioeconomic History   Marital status: Divorced    Spouse name: Not on file   Number of children: Not on file   Years of education: 13   Highest education level: Some college, no degree  Occupational History   Not on file  Tobacco Use   Smoking status: Former    Types: Cigarettes    Quit date: 11/17/1989    Years since quitting: 32.5   Smokeless tobacco: Never  Substance and Sexual Activity   Alcohol use: No    Comment: rarely while in social settings   Drug use: No   Sexual activity: Not Currently  Other Topics Concern   Not on file  Social History Narrative   Not on file   Social Determinants of Health   Financial Resource Strain:  Low Risk  (01/10/2022)   Overall Financial Resource Strain (CARDIA)    Difficulty of Paying Living Expenses: Not hard at all  Food Insecurity: No Food Insecurity (04/16/2022)   Hunger Vital Sign    Worried About Running Out of Food in the Last Year: Never true    Ran Out of Food in the Last Year: Never true  Transportation Needs: No Transportation Needs (01/10/2022)   PRAPARE - Hydrologist (Medical): No    Lack of Transportation (Non-Medical): No  Physical Activity: Sufficiently Active (01/10/2022)   Exercise Vital Sign    Days of Exercise per Week: 7 days    Minutes of Exercise per Session: 30 min  Stress: No Stress Concern Present (04/16/2022)   [employer]    Feeling of Stress : Not at all  Social Connections:  Moderately Isolated (04/16/2022)   Social Connection and Isolation Panel [NHANES]    Frequency of Communication with Friends and Family: More than three times a week    Frequency of Social Gatherings with Friends and Family: Never    Attends Religious Services: Never    Marine scientist or Organizations: Yes    Attends Archivist Meetings: Never    Marital Status: Divorced  Human resources officer Violence: Not At Risk (04/16/2022)   Humiliation, Afraid, Rape, and Kick questionnaire    Fear of Current or Ex-Partner: No    Emotionally Abused: No    Physically Abused: No    Sexually Abused: No    Outpatient Medications Prior to Visit  Medication Sig Dispense Refill   ACCU-CHEK AVIVA PLUS test strip Use as instructed 100 each 11   Accu-Chek Softclix Lancets lancets Use as instructed 100 each 12   Apoaequorin (PREVAGEN PO) Take 1 tablet by mouth daily.     aspirin 81 MG tablet Take 81 mg by mouth daily.     Blood Glucose Monitoring Suppl (ACCU-CHEK AVIVA PLUS) w/Device KIT Use as directed 1 kit 0   CALCIUM PO Take 630 mg by mouth daily. Taking 2 tablets of calcium citrate 315 each - 6101m total     Cholecalciferol (VITAMIN D3) 50 MCG (2000 UT) TABS Take 1 tablet by mouth daily.     Co-Enzyme Q10 100 MG CAPS Take 1 capsule by mouth daily.     glucosamine-chondroitin 500-400 MG tablet Take 1 tablet by mouth daily.     Iron-Vitamin C (IRON 100/C PO) Take 1 tablet by mouth daily.     Krill Oil 500 MG CAPS Take 1 capsule by mouth daily.     Lancets Misc. (ACCU-CHEK FASTCLIX LANCET) KIT Use to check sugar 4 times a day.  Dx code: E11.9 1 kit 0   losartan (COZAAR) 25 MG tablet Take 1 tablet (25 mg total) by mouth daily. 90 tablet 3   LUTEIN PO Take 25 mg by mouth daily.     metFORMIN (GLUCOPHAGE-XR) 500 MG 24 hr tablet TAKE 1 TABLET BY MOUTH  DAILY 90 tablet 3   Multiple Vitamin (MULTIVITAMIN) tablet Take 1 tablet by mouth daily.      rosuvastatin (CRESTOR) 10 MG tablet TAKE 1 TABLET BY  MOUTH  DAILY 90 tablet 3   tiZANidine (ZANAFLEX) 2 MG tablet Take 0.5-2 tablets (1-4 mg total) by mouth every 6 (six) hours as needed for muscle spasms. 30 tablet 1   vitamin C (ASCORBIC ACID) 500 MG tablet Take 500 mg by mouth daily.  glimepiride (AMARYL) 4 MG tablet Take 1 tablet (4 mg total) by mouth daily with breakfast. 90 tablet 1   No facility-administered medications prior to visit.    No Known Allergies   Review of Systems  Constitutional:  Negative for fever and malaise/fatigue.  HENT:  Negative for congestion.   Eyes:  Negative for blurred vision.  Respiratory:  Negative for shortness of breath.   Cardiovascular:  Positive for chest pain. Negative for palpitations, orthopnea, leg swelling and PND.  Gastrointestinal:  Negative for abdominal pain, blood in stool and nausea.  Genitourinary:  Negative for dysuria and frequency.  Musculoskeletal:  Negative for falls.  Skin:  Negative for rash.  Neurological:  Negative for dizziness, loss of consciousness and headaches.  Endo/Heme/Allergies:  Negative for environmental allergies.  Psychiatric/Behavioral:  Negative for depression. The patient is not nervous/anxious.        Objective:    Physical Exam Constitutional:      General: She is not in acute distress.    Appearance: She is well-developed.  HENT:     Head: Normocephalic and atraumatic.  Eyes:     Conjunctiva/sclera: Conjunctivae normal.  Neck:     Thyroid: No thyromegaly.  Cardiovascular:     Rate and Rhythm: Normal rate and regular rhythm.     Heart sounds: Normal heart sounds. No murmur heard. Pulmonary:     Effort: Pulmonary effort is normal. No respiratory distress.     Breath sounds: Normal breath sounds.  Abdominal:     General: Bowel sounds are normal. There is no distension.     Palpations: Abdomen is soft. There is no mass.     Tenderness: There is no abdominal tenderness.  Musculoskeletal:     Cervical back: Neck supple.  Lymphadenopathy:      Cervical: No cervical adenopathy.  Skin:    General: Skin is warm and dry.  Neurological:     Mental Status: She is alert and oriented to person, place, and time.  Psychiatric:        Behavior: Behavior normal.     BP 118/78 (BP Location: Left Arm, Patient Position: Sitting, Cuff Size: Normal)   Pulse 76   Resp 20   Ht '5\' 4"'  (1.626 m)   Wt 153 lb (69.4 kg)   SpO2 98%   BMI 26.26 kg/m  Wt Readings from Last 3 Encounters:  06/05/22 153 lb (69.4 kg)  04/11/22 152 lb 12.8 oz (69.3 kg)  01/24/22 152 lb 9.6 oz (69.2 kg)    Diabetic Foot Exam - Simple   No data filed    Lab Results  Component Value Date   WBC 7.9 11/07/2021   HGB 12.3 11/07/2021   HCT 37.9 11/07/2021   PLT 290.0 11/07/2021   GLUCOSE 67 (L) 11/07/2021   CHOL 162 11/07/2021   TRIG 135.0 11/07/2021   HDL 62.40 11/07/2021   LDLDIRECT 83.0 04/10/2021   LDLCALC 72 11/07/2021   ALT 15 11/07/2021   AST 21 11/07/2021   NA 140 11/07/2021   K 4.0 11/07/2021   CL 104 11/07/2021   CREATININE 0.66 11/07/2021   BUN 14 11/07/2021   CO2 27 11/07/2021   TSH 2.58 11/07/2021   HGBA1C 7.9 (H) 11/07/2021   MICROALBUR 1.6 11/03/2015    Lab Results  Component Value Date   TSH 2.58 11/07/2021   Lab Results  Component Value Date   WBC 7.9 11/07/2021   HGB 12.3 11/07/2021   HCT 37.9 11/07/2021   MCV 88.6 11/07/2021  PLT 290.0 11/07/2021   Lab Results  Component Value Date   NA 140 11/07/2021   K 4.0 11/07/2021   CO2 27 11/07/2021   GLUCOSE 67 (L) 11/07/2021   BUN 14 11/07/2021   CREATININE 0.66 11/07/2021   BILITOT 0.4 11/07/2021   ALKPHOS 50 11/07/2021   AST 21 11/07/2021   ALT 15 11/07/2021   PROT 6.9 11/07/2021   ALBUMIN 4.3 11/07/2021   CALCIUM 9.4 11/07/2021   GFR 82.89 11/07/2021   Lab Results  Component Value Date   CHOL 162 11/07/2021   Lab Results  Component Value Date   HDL 62.40 11/07/2021   Lab Results  Component Value Date   LDLCALC 72 11/07/2021   Lab Results  Component  Value Date   TRIG 135.0 11/07/2021   Lab Results  Component Value Date   CHOLHDL 3 11/07/2021   Lab Results  Component Value Date   HGBA1C 7.9 (H) 11/07/2021       Assessment & Plan:   COLONOSCOPY: PAP: PSA: DEXA:   Problem List Items Addressed This Visit     Type 2 diabetes mellitus ([place]) - Primary    hgba1c acceptable, minimize simple carbs. Increase exercise as tolerated. Continue current meds, has been pharmacist as well      Relevant Medications   glimepiride (AMARYL) 4 MG tablet   Other Relevant Orders   TSH   Comprehensive metabolic panel   CBC   Hemoglobin A1c   Microalbumin / creatinine urine ratio   Hyperlipidemia, mixed    Tolerating statin, encouraged heart healthy diet, avoid trans fats, minimize simple carbs and saturated fats. Increase exercise as tolerated      Relevant Orders   Lipid panel   Acute pain of right knee   Relevant Medications   glimepiride (AMARYL) 4 MG tablet   Osteopenia    Encouraged to get adequate exercise, calcium and vitamin d intake      Muscle cramps    Hydrate and monitor      Relevant Orders   CBC   Magnesium   Mild neurocognitive disorder, unclear etiology    She lives alone and was very worried about this for several months.  She started Prevagen and she does oxyphilic better.  She was getting lost upon driving places etc. but now feels that has resolved.  She will let us know if she has further concerns.      Atypical chest pain    Had a 3 day run of pain under left breast coming and going. Nothing for past 1-2 weeks but given risk factors and advanced age will refer to cariology for further testing and evaluation. EKG today shows sinus rhythm but consideration for previous anteroseptal infarct. Seek care if symptoms if symptoms return while waiting on cardiology appt.      Relevant Orders   Ambulatory referral to Cardiology   EKG 12-Lead (Completed)    I am having Gabrelle T. Escue maintain her aspirin,  multivitamin, CALCIUM PO, Krill Oil, Vitamin D3, LUTEIN PO, tiZANidine, Accu-Chek FastClix Lancet, Iron-Vitamin C (IRON 100/C PO), Accu-Chek Aviva Plus, Accu-Chek Softclix Lancets, Accu-Chek Aviva Plus, vitamin C, Co-Enzyme Q10, glucosamine-chondroitin, losartan, rosuvastatin, metFORMIN, Apoaequorin (PREVAGEN PO), and glimepiride.  Meds ordered this encounter  Medications   glimepiride (AMARYL) 4 MG tablet    Sig: Take 1 tablet (4 mg total) by mouth daily with breakfast.    Dispense:  90 tablet    Refill:  1    Requesting 1 year supply

## 2022-06-04 NOTE — Addendum Note (Signed)
Addended by: Lynnea Ferrier R on: 06/04/2022 12:08 PM   Modules accepted: Orders

## 2022-06-05 ENCOUNTER — Encounter: Payer: Self-pay | Admitting: Family Medicine

## 2022-06-05 ENCOUNTER — Ambulatory Visit (INDEPENDENT_AMBULATORY_CARE_PROVIDER_SITE_OTHER): Payer: Medicare Other | Admitting: Family Medicine

## 2022-06-05 VITALS — BP 118/78 | HR 76 | Resp 20 | Ht 64.0 in | Wt 153.0 lb

## 2022-06-05 DIAGNOSIS — R0789 Other chest pain: Secondary | ICD-10-CM

## 2022-06-05 DIAGNOSIS — E782 Mixed hyperlipidemia: Secondary | ICD-10-CM | POA: Diagnosis not present

## 2022-06-05 DIAGNOSIS — E119 Type 2 diabetes mellitus without complications: Secondary | ICD-10-CM

## 2022-06-05 DIAGNOSIS — M8580 Other specified disorders of bone density and structure, unspecified site: Secondary | ICD-10-CM | POA: Diagnosis not present

## 2022-06-05 DIAGNOSIS — G3184 Mild cognitive impairment, so stated: Secondary | ICD-10-CM | POA: Diagnosis not present

## 2022-06-05 DIAGNOSIS — R252 Cramp and spasm: Secondary | ICD-10-CM

## 2022-06-05 DIAGNOSIS — M25561 Pain in right knee: Secondary | ICD-10-CM

## 2022-06-05 HISTORY — DX: Other chest pain: R07.89

## 2022-06-05 LAB — LIPID PANEL
Cholesterol: 157 mg/dL (ref 0–200)
HDL: 55 mg/dL (ref >39.00)
LDL Cholesterol: 73 mg/dL (ref 0–99)
NonHDL: 101.79
Total CHOL/HDL Ratio: 3
Triglycerides: 146 mg/dL (ref 0.0–149.0)
VLDL: 29.2 mg/dL (ref 0.0–40.0)

## 2022-06-05 LAB — COMPREHENSIVE METABOLIC PANEL
ALT: 17 U/L (ref 0–35)
AST: 21 U/L (ref 0–37)
Albumin: 4 g/dL (ref 3.5–5.2)
Alkaline Phosphatase: 50 U/L (ref 39–117)
BUN: 13 mg/dL (ref 6–23)
CO2: 27 mEq/L (ref 19–32)
Calcium: 9.6 mg/dL (ref 8.4–10.5)
Chloride: 104 mEq/L (ref 96–112)
Creatinine, Ser: 0.68 mg/dL (ref 0.40–1.20)
GFR: 81.96 mL/min (ref >60.00)
Glucose, Bld: 91 mg/dL (ref 70–99)
Potassium: 4.3 mEq/L (ref 3.5–5.1)
Sodium: 139 mEq/L (ref 135–145)
Total Bilirubin: 0.5 mg/dL (ref 0.2–1.2)
Total Protein: 6.5 g/dL (ref 6.0–8.3)

## 2022-06-05 LAB — MICROALBUMIN / CREATININE URINE RATIO
Creatinine,U: 114.5 mg/dL
Microalb Creat Ratio: 1.4 mg/g (ref 0.0–30.0)
Microalb, Ur: 1.6 mg/dL (ref 0.0–1.9)

## 2022-06-05 LAB — CBC
HCT: 38.6 % (ref 36.0–46.0)
Hemoglobin: 12.9 g/dL (ref 12.0–15.0)
MCHC: 33.6 g/dL (ref 30.0–36.0)
MCV: 90.6 fl (ref 78.0–100.0)
Platelets: 285 10*3/uL (ref 150.0–400.0)
RBC: 4.26 Mil/uL (ref 3.87–5.11)
RDW: 12.8 % (ref 11.5–15.5)
WBC: 5.8 10*3/uL (ref 4.0–10.5)

## 2022-06-05 LAB — TSH: TSH: 2.01 u[IU]/mL (ref 0.35–5.50)

## 2022-06-05 LAB — MAGNESIUM: Magnesium: 1.7 mg/dL (ref 1.5–2.5)

## 2022-06-05 LAB — HEMOGLOBIN A1C: Hgb A1c MFr Bld: 7.8 % — ABNORMAL HIGH (ref 4.6–6.5)

## 2022-06-05 MED ORDER — GLIMEPIRIDE 4 MG PO TABS: 4.0000 mg | ORAL_TABLET | Freq: Every day | ORAL | 1 refills | Status: DC

## 2022-06-05 NOTE — Assessment & Plan Note (Signed)
hgba1c acceptable, minimize simple carbs. Increase exercise as tolerated. Continue current meds, has been pharmacist as well

## 2022-06-05 NOTE — Assessment & Plan Note (Signed)
Had a 3 day run of pain under left breast coming and going. Nothing for past 1-2 weeks but given risk factors and advanced age will refer to cariology for further testing and evaluation. EKG today shows sinus rhythm but consideration for previous anteroseptal infarct. Seek care if symptoms if symptoms return while waiting on cardiology appt.

## 2022-06-05 NOTE — Assessment & Plan Note (Signed)
Hydrate and monitor 

## 2022-06-05 NOTE — Assessment & Plan Note (Signed)
She lives alone and was very worried about this for several months.  She started Prevagen and she does oxyphilic better.  She was getting lost upon driving places etc. but now feels that has resolved.  She will let us know if she has further concerns.

## 2022-06-05 NOTE — Assessment & Plan Note (Signed)
Tolerating statin, encouraged heart healthy diet, avoid trans fats, minimize simple carbs and saturated fats. Increase exercise as tolerated 

## 2022-06-05 NOTE — Patient Instructions (Addendum)
Resveratrol (grape seed extract)   Benefiber powder once to twice daily and 60 ounces of water daily  Dementia Dementia is a condition that affects the way the brain functions. It often affects memory and thinking. Usually, dementia gets worse with time and cannot be reversed (progressive dementia). There are many types of dementia, including: Alzheimer's disease. This type is the most common. Vascular dementia. This type may happen as the result of a stroke. Lewy body dementia. This type may happen to people who have Parkinson's disease. Frontotemporal dementia. This type is caused by damage to nerve cells (neurons) in certain parts of the brain. Some people may be affected by more than one type of dementia. This is called mixed dementia. What are the causes? Dementia is caused by damage to cells in the brain. The area of the brain and the types of cells damaged determine the type of dementia. Usually, this damage is irreversible or cannot be undone. Some examples of irreversible causes include: Conditions that affect the blood vessels of the brain, such as diabetes, heart disease, or blood vessel disease. Genetic mutations. In some cases, changes in the brain may be caused by another condition and can be reversed or slowed. Some examples of reversible causes include: Injury to the brain. Certain medicines. Infection, such as meningitis. Metabolic problems, such as vitamin B12 deficiency or thyroid disease. Pressure on the brain, such as from a tumor, blood clot, or too much fluid in the brain (hydrocephalus). Autoimmune diseases that affect the brain or arteries, such as limbic encephalitis or vasculitis. What are the signs or symptoms? Symptoms of dementia depend on the type of dementia. Common signs of dementia include problems with remembering, thinking, problem solving, decision making, and communicating. These signs develop slowly or get worse with time. This may include: Problems  remembering events or people. Having trouble taking a bath or putting clothes on. Forgetting appointments or forgetting to pay bills. Difficulty planning and preparing meals. Having trouble speaking. Getting lost easily. Changes in behavior or mood. How is this diagnosed? This condition is diagnosed by a specialist (neurologist). It is diagnosed based on the history of your symptoms, your medical history, a physical exam, and tests. Tests may include: Tests to evaluate brain function, such as memory tests, cognitive tests, and other tests. Lab tests, such as blood or urine tests. Imaging tests, such as a CT scan, a PET scan, or an MRI. Genetic testing. This may be done if other family members have a diagnosis of certain types of dementia. Your health care provider will talk with you and your family, friends, or caregivers about your history and symptoms. How is this treated? Treatment for this condition depends on the cause of the dementia. Progressive dementias, such as Alzheimer's disease, cannot be cured, but there may be treatments that help to manage symptoms. Treatment might involve taking medicines that may help to: Control the dementia. Slow down the progression of the dementia. Manage symptoms. In some cases, treating the cause of your dementia can improve symptoms, reverse symptoms, or slow down how quickly your dementia becomes worse. Your health care provider can direct you to support groups, organizations, and other health care providers who can help with decisions about your care. Follow these instructions at home: Medicines Take over-the-counter and prescription medicines only as told by your health care provider. Use a pill organizer or pill reminder to help you manage your medicines. Avoid taking medicines that can affect thinking, such as pain medicines or sleeping medicines.  Lifestyle Make healthy lifestyle choices. Be physically active as told by your health care  provider. Do not use any products that contain nicotine or tobacco, such as cigarettes, e-cigarettes, and chewing tobacco. If you need help quitting, ask your health care provider. Do not drink alcohol. Practice stress-management techniques when you get stressed. Spend time with other people. Make sure to get quality sleep. These tips can help you get a good night's rest: Avoid napping during the day. Keep your sleeping area dark and cool. Avoid exercising during the few hours before you go to bed. Avoid caffeine products in the evening. Eating and drinking Drink enough fluid to keep your urine pale yellow. Eat a healthy diet. General instructions  Work with your health care provider to determine what you need help with and what your safety needs are. Talk with your health care provider about whether it is safe for you to drive. If you were given a bracelet that identifies you as a person with memory loss or tracks your location, make sure to wear it at all times. Work with your family to make important decisions, such as advance directives, medical power of attorney, or a living will. Keep all follow-up visits. This is important. Where to find more information Alzheimer's Association: [employer] National Institute on Aging: DVDEnthusiasts.nl World Health Organization: RoleLink.com.br Contact a health care provider if: You have any new or worsening symptoms. You have problems with choking or swallowing. Get help right away if: You feel depressed or sad, or feel that you want to harm yourself. Your family members become concerned for your safety. If you ever feel like you may hurt yourself or others, or have thoughts about taking your own life, get help right away. Go to your nearest emergency department or: Call your local emergency services (911 in the [place] Call a suicide crisis helpline, such as the [facility] at (770)145-9420 or 988 in the [place].  This is open 24 hours a day in the [place] Text the Crisis Text Line at 850 247 3330 (in the [place].). Summary Dementia is a condition that affects the way the brain functions. Dementia often affects memory and thinking. Usually, dementia gets worse with time and cannot be reversed (progressive dementia). Treatment for this condition depends on the cause of the dementia. Work with your health care provider to determine what you need help with and what your safety needs are. Your health care provider can direct you to support groups, organizations, and other health care providers who can help with decisions about your care. This information is not intended to replace advice given to you by your health care provider. Make sure you discuss any questions you have with your health care provider. Document Revised: 07/05/2021 Document Reviewed: 04/25/2020 Elsevier Patient Education  [place].

## 2022-06-05 NOTE — Assessment & Plan Note (Signed)
Encouraged to get adequate exercise, calcium and vitamin d intake 

## 2022-06-05 NOTE — Progress Notes (Unsigned)
Cardiology Office Note:   Date:  06/06/2022  NAME:  Gail Lester    MRN: [id number] DOB:  April 22, 1941   PCP:  Mosie Lukes, MD  Cardiologist:  None  Electrophysiologist:  None   Referring MD: Mosie Lukes, MD   Chief Complaint  Patient presents with   Chest Pain    History of Present Illness:   Gail Lester is a 81 y.o. female with a hx of DM, HTN, HLD who is being seen today for the evaluation of chest pain at the request of Mosie Lukes, MD. she reports for the last 3 days she has had a discomfort in her chest.  Described as left-sided chest discomfort.  Sharp.  Can last 10 to 15 minutes.  She reports it is occurred in the afternoon hours.  She reports it is occur while sitting.  No trigger.  No alleviating factor.  She reports it simply goes away.  Does not appear to be worsened by exertion.  No shortness of breath.  No fevers or chills.  No other symptoms with this.  She has been a bit bothered by this.  She has no history of acid reflux.  No association with food.  Recent lab work is largely unremarkable.  EKG from primary care office demonstrates sinus rhythm with possible anteroseptal infarct.  No history of heart attack or stroke.  She does have a 20-year history of diabetes.  She has hypertension that is well controlled.  She is a former smoker of 15 years.  No alcohol or drug use.  She is a retired Network engineer.  She has 2 children.  She has several grandchildren.  Most recent serum creatinine 0.68.  Total cholesterol 157, HDL 55, LDL 73, triglycerides 146.  No strong family history of heart disease.  Age and diabetes are her biggest risk factors.  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   Epidermal cyst 01/21/2017   Fibrocystic breast 02/10/2013   Left h/o FN biopsy    Ganglion cyst  11/14/2014   Right 2nd finger   GERD (gastroesophageal reflux disease) 10/09/2020   Hemangioma 09/28/2014   History of cataracts    Previous surgery to remove bilaterally   History of chicken pox    as a child   History of colonic polyp 11/21/2017   History of measles    as a child   History of mumps    as a child   Hypercholesteremia    Hyperlipidemia, mixed 02/10/2013   Melanocytic nevi, unspecified 09/28/2014   Mild neurocognitive disorder, unclear etiology 11/21/2020   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 08/14/2015   Onychomycosis 02/24/2017   Osteopenia 05/23/2017   Seborrheic keratosis 09/28/2014   Type 2 diabetes mellitus 02/10/2013    Past Surgical History: Past Surgical History:  Procedure Laterality Date   APPENDECTOMY  1955   BREAST BIOPSY  1985-1990   benign   EYE SURGERY Right 04/2019   cataract removal   EYE SURGERY Left 03/2020   cataract removal   ORIF WRIST FRACTURE  12/09/2011   Procedure: OPEN REDUCTION INTERNAL FIXATION (ORIF) WRIST FRACTURE;  Surgeon: Linna Hoff;  Location: [facility];  Service: Orthopedics;  Laterality: Left;    Current Medications: Current Meds  Medication Sig  ACCU-CHEK AVIVA PLUS test strip Use as instructed   Accu-Chek Softclix Lancets lancets Use as instructed   Apoaequorin (PREVAGEN PO) Take 1 tablet by mouth daily.   aspirin 81 MG tablet Take 81 mg by mouth daily.   Blood Glucose Monitoring Suppl (ACCU-CHEK AVIVA PLUS) w/Device KIT Use as directed   CALCIUM PO Take 630 mg by mouth daily. Taking 2 tablets of calcium citrate 315 each - 690m total   Cholecalciferol (VITAMIN D3) 50 MCG (2000 UT) TABS Take 1 tablet by mouth daily.   Co-Enzyme Q10 100 MG CAPS Take 1 capsule by mouth daily.   glimepiride (AMARYL) 4 MG tablet Take 1 tablet (4 mg total) by mouth daily with breakfast.   glucosamine-chondroitin 500-400 MG tablet Take 1 tablet by mouth daily.   Iron-Vitamin C (IRON 100/C PO) Take 1 tablet by mouth daily.   Krill Oil 500 MG CAPS  Take 1 capsule by mouth daily.   Lancets Misc. (ACCU-CHEK FASTCLIX LANCET) KIT Use to check sugar 4 times a day.  Dx code: E11.9   losartan (COZAAR) 25 MG tablet Take 1 tablet (25 mg total) by mouth daily.   LUTEIN PO Take 25 mg by mouth daily.   metFORMIN (GLUCOPHAGE-XR) 500 MG 24 hr tablet TAKE 1 TABLET BY MOUTH  DAILY   metoprolol tartrate (LOPRESSOR) 100 MG tablet Take 1 tablet by mouth once for procedure.   Multiple Vitamin (MULTIVITAMIN) tablet Take 1 tablet by mouth daily.    rosuvastatin (CRESTOR) 10 MG tablet TAKE 1 TABLET BY MOUTH  DAILY   tiZANidine (ZANAFLEX) 2 MG tablet Take 0.5-2 tablets (1-4 mg total) by mouth every 6 (six) hours as needed for muscle spasms.   vitamin C (ASCORBIC ACID) 500 MG tablet Take 500 mg by mouth daily.     Allergies:    Patient has no known allergies.   Social History: Social History   Socioeconomic History   Marital status: Divorced    Spouse name: Not on file   Number of children: 2   Years of education: 13   Highest education level: Some college, no degree  Occupational History   Occupation: Retired -Nurse, learning disability Tobacco Use   Smoking status: Former    Years: 15.00    Types: Cigarettes    Quit date: 11/17/1989    Years since quitting: 32.5   Smokeless tobacco: Never  Substance and Sexual Activity   Alcohol use: No    Comment: rarely while in social settings   Drug use: No   Sexual activity: Not Currently  Other Topics Concern   Not on file  Social History Narrative   Not on file   Social Determinants of Health   Financial Resource Strain: Low Risk  (01/10/2022)   Overall Financial Resource Strain (CARDIA)    Difficulty of Paying Living Expenses: Not hard at all  Food Insecurity: No Food Insecurity (04/16/2022)   Hunger Vital Sign    Worried About Running Out of Food in the Last Year: Never true    [place] the Last Year: Never true  Transportation Needs: No Transportation Needs (01/10/2022)   PRAPARE -  THydrologist(Medical): No    Lack of Transportation (Non-Medical): No  Physical Activity: Sufficiently Active (01/10/2022)   Exercise Vital Sign    Days of Exercise per Week: 7 days    Minutes of Exercise per Session: 30 min  Stress: No Stress Concern Present (04/16/2022)   [employer]  [employer]ational [employer] - Occupational Stress Questionnaire    Feeling of Stress : Not at all  Social Connections: Moderately Isolated (04/16/2022)   Social Connection and Isolation Panel [NHANES]    Frequency of Communication with Friends and Family: More than three times a week    Frequency of Social Gatherings with Friends and Family: Never    Attends Religious Services: Never    Marine scientist or Organizations: Yes    Attends Archivist Meetings: Never    Marital Status: Divorced     Family History: The patient's family history includes Alcoholism in her mother; Alzheimer's disease in her maternal aunt; Cancer in her maternal aunt and maternal grandmother; Colon cancer in an other family member; Diabetes in her father; Kidney disease in her maternal grandmother and mother; Scoliosis in her son.  ROS:   All other ROS reviewed and negative. Pertinent positives noted in the HPI.     EKGs/Labs/Other Studies Reviewed:   The following studies were personally reviewed by me today:  EKG:  EKG reviewed from primary care physician office demonstrates sinus rhythm heart rate 65, old anteroseptal infarct, no acute ischemic changes  Recent Labs: 06/05/2022: ALT 17; BUN 13; Creatinine, Ser 0.68; Hemoglobin 12.9; Magnesium 1.7; Platelets 285.0; Potassium 4.3; Sodium 139; TSH 2.01   Recent Lipid Panel    Component Value Date/Time   CHOL 157 06/05/2022 1142   TRIG 146.0 06/05/2022 1142   HDL 55.00 06/05/2022 1142   CHOLHDL 3 06/05/2022 1142   VLDL 29.2 06/05/2022 1142   LDLCALC 73 06/05/2022 1142   LDLCALC 71 10/06/2020 1423   LDLDIRECT 83.0 04/10/2021 1412     Physical Exam:   VS:  BP 126/60   Pulse 92   Ht '5\' 4"'  (1.626 m)   Wt 151 lb 3.2 oz (68.6 kg)   SpO2 94%   BMI 25.95 kg/m    Wt Readings from Last 3 Encounters:  06/06/22 151 lb 3.2 oz (68.6 kg)  06/05/22 153 lb (69.4 kg)  04/11/22 152 lb 12.8 oz (69.3 kg)    General: Well nourished, well developed, in no acute distress Head: Atraumatic, normal size  Eyes: PEERLA, EOMI  Neck: Supple, no JVD Endocrine: No thryomegaly Cardiac: Normal S1, S2; RRR; no murmurs, rubs, or gallops Lungs: Clear to auscultation bilaterally, no wheezing, rhonchi or rales  Abd: Soft, nontender, no hepatomegaly  Ext: No edema, pulses 2+ Musculoskeletal: No deformities, BUE and BLE strength normal and equal Skin: Warm and dry, no rashes   Neuro: Alert and oriented to person, place, time, and situation, CNII-XII grossly intact, no focal deficits  Psych: Normal mood and affect   ASSESSMENT:   Gail Lester is a 81 y.o. female who presents for the following: 1. Precordial pain    PLAN:   1. Precordial pain -She presents with possible cardiac chest pain.  Symptoms have occurred at rest.  They lasted 10 to 15 minutes.  Described as sharp.  Could be acid reflux if not cardiac.  Because CV risk factors include longstanding diabetes and former tobacco abuse.  EKG from primary care office with possible old anteroseptal infarct.  Therefore I recommended an echocardiogram to help elucidate this.  We also will obtain a coronary CTA.  She will take 100 mg of metoprolol tartrate 2 hours before the scan.  She will see me back based on the results of the scan.  Again really unclear if this represents cardiac symptoms or not.  Disposition: Return if symptoms  worsen or fail to improve.  Medication Adjustments/Labs and Tests Ordered: Current medicines are reviewed at length with the patient today.  Concerns regarding medicines are outlined above.  Orders Placed This Encounter  Procedures   CT CORONARY MORPH W/CTA COR  W/SCORE W/CA W/CM &/OR WO/CM   ECHOCARDIOGRAM COMPLETE   Meds ordered this encounter  Medications   metoprolol tartrate (LOPRESSOR) 100 MG tablet    Sig: Take 1 tablet by mouth once for procedure.    Dispense:  1 tablet    Refill:  0    Patient Instructions  Medication Instructions:  Take Metoprolol 100 mg two hours before CT when scheduled.   *If you need a refill on your cardiac medications before your next appointment, please call your pharmacy*   Testing/Procedures: Echocardiogram - Your physician has requested that you have an echocardiogram. Echocardiography is a painless test that uses sound waves to create images of your heart. It provides your doctor with information about the size and shape of your heart and how well your heart's chambers and valves are working. This procedure takes approximately one hour. There are no restrictions for this procedure.   Your physician has requested that you have cardiac CT. Cardiac computed tomography (CT) is a painless test that uses an x-ray machine to take clear, detailed pictures of your heart. For further information please visit [email or web address].[email or web address]. Please follow instruction sheet as given.   Follow-Up: At [facility], you and your health needs are our priority.  As part of our continuing mission to provide you with exceptional heart care, we have created designated Provider Care Teams.  These Care Teams include your primary Cardiologist (physician) and Advanced Practice Providers (APPs -  Physician Assistants and Nurse Practitioners) who all work together to provide you with the care you need, when you need it.  We recommend signing up for the patient portal called "MyChart".  Sign up information is provided on this After Visit Summary.  MyChart is used to connect with patients for Virtual Visits (Telemedicine).  Patients are able to view lab/test results, encounter notes, upcoming appointments, etc.  Non-urgent messages can be sent to  your provider as well.   To learn more about what you can do with MyChart, go to [email or web address].    Your next appointment:   As needed  The format for your next appointment:   In Person  Provider:   Eleonore Chiquito, MD    Other Instructions   Your cardiac CT will be scheduled at one of the below locations:   [facility] [address] [place], [place] [address] [phone]  If scheduled at [facility], please arrive at the [facility] and Children's Entrance (Entrance C2) of [facility] 30 minutes prior to test start time. You can use the FREE valet parking offered at entrance C (encouraged to control the heart rate for the test)  Proceed to the [facility] Radiology Department (first floor) to check-in and test prep.  All radiology patients and guests should use entrance C2 at [facility], accessed from [facility], even though the hospital's physical address listed is [address].     Please follow these instructions carefully (unless otherwise directed):   On the Night Before the Test: Be sure to Drink plenty of water. Do not consume any caffeinated/decaffeinated beverages or chocolate 12 hours prior to your test. Do not take any antihistamines 12 hours prior to your test.  On the Day of the  Test: Drink plenty of water until 1 hour prior to the test. Do not eat any food 4 hours prior to the test. You may take your regular medications prior to the test.  Take metoprolol (Lopressor) two hours prior to test. HOLD Furosemide/Hydrochlorothiazide morning of the test. FEMALES- please wear underwire-free bra if available, avoid dresses & tight clothing   After the Test: Drink plenty of water. After receiving IV contrast, you may experience a mild flushed feeling. This is normal. On occasion, you may experience a mild rash up to 24 hours after the test. This is not dangerous. If this occurs, you can take  Benadryl 25 mg and increase your fluid intake. If you experience trouble breathing, this can be serious. If it is severe call 911 IMMEDIATELY. If it is mild, please call our office. If you take any of these medications: Glipizide/Metformin, Avandament, Glucavance, please do not take 48 hours after completing test unless otherwise instructed.  We will call to schedule your test 2-4 weeks out understanding that some insurance companies will need an authorization prior to the service being performed.   For non-scheduling related questions, please contact the cardiac imaging nurse navigator should you have any questions/concerns: Marchia Bond, Cardiac Imaging Nurse Navigator Gordy Clement, Cardiac Imaging Nurse Navigator [facility] Direct Office Dial: [phone]   For scheduling needs, including cancellations and rescheduling, please call [place], [phone].            Signed, Addison Naegeli. Audie Box, MD, [place]  [address], [place], [place] [address] [phone]  06/06/2022 9:24 AM

## 2022-06-06 ENCOUNTER — Ambulatory Visit: Payer: Medicare Other | Admitting: Cardiovascular Disease

## 2022-06-06 ENCOUNTER — Encounter: Payer: Self-pay | Admitting: Cardiovascular Disease

## 2022-06-06 VITALS — BP 126/60 | HR 92 | Ht 64.0 in | Wt 151.2 lb

## 2022-06-06 DIAGNOSIS — R072 Precordial pain: Secondary | ICD-10-CM | POA: Diagnosis not present

## 2022-06-06 MED ORDER — METOPROLOL TARTRATE 100 MG PO TABS: ORAL_TABLET | ORAL | 0 refills | Status: DC

## 2022-06-06 NOTE — Patient Instructions (Addendum)
Medication Instructions:  Take Metoprolol 100 mg two hours before CT when scheduled.   *If you need a refill on your cardiac medications before your next appointment, please call your pharmacy*   Testing/Procedures: Echocardiogram - Your physician has requested that you have an echocardiogram. Echocardiography is a painless test that uses sound waves to create images of your heart. It provides your doctor with information about the size and shape of your heart and how well your heart's chambers and valves are working. This procedure takes approximately one hour. There are no restrictions for this procedure.   Your physician has requested that you have cardiac CT. Cardiac computed tomography (CT) is a painless test that uses an x-ray machine to take clear, detailed pictures of your heart. For further information please visit [email or web address].[email or web address]. Please follow instruction sheet as given.   Follow-Up: At [facility], you and your health needs are our priority.  As part of our continuing mission to provide you with exceptional heart care, we have created designated Provider Care Teams.  These Care Teams include your primary Cardiologist (physician) and Advanced Practice Providers (APPs -  Physician Assistants and Nurse Practitioners) who all work together to provide you with the care you need, when you need it.  We recommend signing up for the patient portal called "MyChart".  Sign up information is provided on this After Visit Summary.  MyChart is used to connect with patients for Virtual Visits (Telemedicine).  Patients are able to view lab/test results, encounter notes, upcoming appointments, etc.  Non-urgent messages can be sent to your provider as well.   To learn more about what you can do with MyChart, go to [email or web address].    Your next appointment:   As needed  The format for your next appointment:   In Person  Provider:   Eleonore Chiquito, MD    Other Instructions   Your  cardiac CT will be scheduled at one of the below locations:   [facility] [address] [place], [place] [address] [phone]  If scheduled at [facility], please arrive at the [facility] and Children's Entrance (Entrance C2) of [facility] 30 minutes prior to test start time. You can use the FREE valet parking offered at entrance C (encouraged to control the heart rate for the test)  Proceed to the [facility] Radiology Department (first floor) to check-in and test prep.  All radiology patients and guests should use entrance C2 at [facility], accessed from [facility], even though the hospital's physical address listed is [address]     Please follow these instructions carefully (unless otherwise directed):   On the Night Before the Test: Be sure to Drink plenty of water. Do not consume any caffeinated/decaffeinated beverages or chocolate 12 hours prior to your test. Do not take any antihistamines 12 hours prior to your test.  On the Day of the Test: Drink plenty of water until 1 hour prior to the test. Do not eat any food 4 hours prior to the test. You may take your regular medications prior to the test.  Take metoprolol (Lopressor) two hours prior to test. HOLD Furosemide/Hydrochlorothiazide morning of the test. FEMALES- please wear underwire-free bra if available, avoid dresses & tight clothing   After the Test: Drink plenty of water. After receiving IV contrast, you may experience a mild flushed feeling. This is normal. On occasion, you may experience a mild rash up to 24 hours after the test. This  is not dangerous. If this occurs, you can take Benadryl 25 mg and increase your fluid intake. If you experience trouble breathing, this can be serious. If it is severe call 911 IMMEDIATELY. If it is mild, please call our office. If you take any of these medications: Glipizide/Metformin, Avandament, Glucavance, please  do not take 48 hours after completing test unless otherwise instructed.  We will call to schedule your test 2-4 weeks out understanding that some insurance companies will need an authorization prior to the service being performed.   For non-scheduling related questions, please contact the cardiac imaging nurse navigator should you have any questions/concerns: Marchia Bond, Cardiac Imaging Nurse Navigator Gordy Clement, Cardiac Imaging Nurse Navigator [facility] Direct Office Dial: [phone]   For scheduling needs, including cancellations and rescheduling, please call [place], [phone].

## 2022-06-18 ENCOUNTER — Ambulatory Visit (HOSPITAL_COMMUNITY): Payer: Medicare Other | Attending: Internal Medicine

## 2022-06-18 DIAGNOSIS — R072 Precordial pain: Secondary | ICD-10-CM | POA: Diagnosis not present

## 2022-06-18 LAB — ECHOCARDIOGRAM COMPLETE
Area-P 1/2: 3.62 cm2
S' Lateral: 2.5 cm

## 2022-06-21 ENCOUNTER — Encounter: Payer: Self-pay | Admitting: *Deleted

## 2022-06-25 ENCOUNTER — Telehealth (HOSPITAL_COMMUNITY): Payer: Self-pay | Admitting: *Deleted

## 2022-06-25 ENCOUNTER — Other Ambulatory Visit (HOSPITAL_COMMUNITY): Payer: Self-pay | Admitting: *Deleted

## 2022-06-25 MED ORDER — METOPROLOL TARTRATE 100 MG PO TABS: ORAL_TABLET | ORAL | 0 refills | Status: DC

## 2022-06-25 NOTE — Telephone Encounter (Signed)
Reaching out to patient to offer assistance regarding upcoming cardiac imaging study; pt verbalizes understanding of appt date/time, parking situation and where to check in, pre-test NPO status and medications ordered, and verified current allergies; name and call back number provided for further questions should they arise  Gordy Clement RN Navigator Cardiac Imaging [facility] [phone] office [phone] cell  Patient to take '100mg'$  metoprolol tartrate two hours prior to her cardiac CT scan. She is aware to arrive 9:30am.

## 2022-06-28 ENCOUNTER — Ambulatory Visit (HOSPITAL_COMMUNITY)
Admission: RE | Admit: 2022-06-28 | Discharge: 2022-06-28 | Disposition: A | Discharge status: Home / Self Care | Payer: Medicare Other | Source: Ambulatory Visit | Attending: Cardiovascular Disease | Admitting: Cardiovascular Disease

## 2022-06-28 ENCOUNTER — Encounter (HOSPITAL_COMMUNITY): Payer: Self-pay

## 2022-06-28 DIAGNOSIS — R072 Precordial pain: Secondary | ICD-10-CM | POA: Diagnosis not present

## 2022-06-28 MED ORDER — NITROGLYCERIN 0.4 MG SL SUBL
0.8000 mg | SUBLINGUAL_TABLET | Freq: Once | SUBLINGUAL | Status: AC
  Administered 2022-06-28: 0.8 mg via SUBLINGUAL

## 2022-06-28 MED ORDER — NITROGLYCERIN 0.4 MG SL SUBL
SUBLINGUAL_TABLET | SUBLINGUAL | Status: AC
  Filled 2022-06-28: qty 2

## 2022-06-28 MED ORDER — IOHEXOL 350 MG/ML SOLN
100.0000 mL | Freq: Once | INTRAVENOUS | Status: AC | PRN
Start: 2022-06-28 — End: 2022-06-28
  Administered 2022-06-28: 100 mL via INTRAVENOUS

## 2022-06-28 NOTE — Progress Notes (Signed)
CT scan completed. Tolerated well. D/C home ambulatory, awake and alert. In no distress. 

## 2022-06-29 ENCOUNTER — Telehealth: Payer: Self-pay | Admitting: Cardiovascular Disease

## 2022-06-29 ENCOUNTER — Encounter: Payer: Self-pay | Admitting: Family Medicine

## 2022-06-29 ENCOUNTER — Other Ambulatory Visit: Payer: Self-pay

## 2022-06-29 MED ORDER — ROSUVASTATIN CALCIUM 20 MG PO TABS: 20.0000 mg | ORAL_TABLET | Freq: Every day | ORAL | 1 refills | Status: DC

## 2022-06-29 NOTE — Telephone Encounter (Signed)
Thank you :)

## 2022-06-29 NOTE — Telephone Encounter (Signed)
New Message:      Patient said she noticed on My Chart, they had her listed as a 81 yr old Female. She was concerned, and wonder if this would have made a difference with her CT results .

## 2022-06-29 NOTE — Telephone Encounter (Signed)
Patient concerned that she was listed as a female on her CCTA results. Contacted Judson Roch at [facility] [phone] to see about correcting the patient's status. She stated she would contact Dr. Debara Pickett for him to make the correction. Patient advised and will monitor for correction.

## 2022-09-02 ENCOUNTER — Other Ambulatory Visit: Payer: Self-pay | Admitting: Cardiovascular Disease

## 2022-09-11 DIAGNOSIS — H26491 Other secondary cataract, right eye: Secondary | ICD-10-CM | POA: Diagnosis not present

## 2022-09-11 DIAGNOSIS — E119 Type 2 diabetes mellitus without complications: Secondary | ICD-10-CM | POA: Diagnosis not present

## 2022-09-11 DIAGNOSIS — H43813 Vitreous degeneration, bilateral: Secondary | ICD-10-CM | POA: Diagnosis not present

## 2022-09-11 LAB — HM DIABETES EYE EXAM

## 2022-09-12 ENCOUNTER — Encounter: Payer: Self-pay | Admitting: Family Medicine

## 2022-09-13 ENCOUNTER — Other Ambulatory Visit: Payer: Self-pay | Admitting: Family Medicine

## 2022-09-13 DIAGNOSIS — M25561 Pain in right knee: Secondary | ICD-10-CM

## 2022-09-19 ENCOUNTER — Ambulatory Visit: Payer: Medicare Other | Admitting: Family Medicine

## 2022-09-19 VITALS — BP 120/78 | HR 72 | Ht 64.0 in | Wt 153.0 lb

## 2022-09-19 DIAGNOSIS — M545 Low back pain, unspecified: Secondary | ICD-10-CM | POA: Diagnosis not present

## 2022-09-19 MED ORDER — TIZANIDINE HCL 2 MG PO TABS: 2.0000 mg | ORAL_TABLET | Freq: Every day | ORAL | 0 refills | Status: DC

## 2022-09-19 NOTE — Patient Instructions (Addendum)
Thank you for coming in today.   I've sent a prescription for tizanidine to your pharmacy.   Try using a heading pad  I've referred you to Physical Therapy.  Let us know if you don't hear from them in one week.   Check back in 6 weeks  TENS UNIT: This is helpful for muscle pain and spasm.   Search and Purchase a TENS 7000 2nd edition at  www.tenspros.com or www.[employer].com It should be less than $30.     TENS unit instructions: Do not shower or bathe with the unit on Turn the unit off before removing electrodes or batteries If the electrodes lose stickiness add a drop of water to the electrodes after they are disconnected from the unit and place on plastic sheet. If you continued to have difficulty, call the TENS unit company to purchase more electrodes. Do not apply lotion on the skin area prior to use. Make sure the skin is clean and dry as this will help prolong the life of the electrodes. After use, always check skin for unusual red areas, rash or other skin difficulties. If there are any skin problems, does not apply electrodes to the same area. Never remove the electrodes from the unit by pulling the wires. Do not use the TENS unit or electrodes other than as directed. Do not change electrode placement without consultating your therapist or physician. Keep 2 fingers with between each electrode. Wear time ratio is 2:1, on to off times.    For example on for 30 minutes off for 15 minutes and then on for 30 minutes off for 15 minutes

## 2022-09-19 NOTE — Progress Notes (Signed)
   I, Peterson Lombard, LAT, ATC acting as a scribe for Lynne Leader, MD.  Charlott Rakes AMIAYAH GIEBEL is a 81 y.o. female who presents to [place] [facility] today for L buttock pain. Pt was previously seen by Dr. Georgina Snell on 11/08/21 for an injury to her R knee from a fall. Today, pt c/o L buttock pain ongoing since Monday evening. Pt locates pain to the L-side of her buttock, below the belt line. Pt rates pain as severe and sharp.     Radiating pain: no LE numbness/tingling: no LE weakness: no Aggravates: everything, sitting, transiting to stand Treatments tried: IBU  Dx imaging: 09/08/19 Sacrum/coccyx XR   Pertinent review of systems: No fevers or chills  Relevant historical information: History of a sacral fracture after a fall September 2020.   Exam:  BP 120/78   Pulse 72   Ht '5\' 4"'$  (1.626 m)   Wt 153 lb (69.4 kg)   SpO2 98%   BMI 26.26 kg/m  General: Well Developed, well nourished, and in no acute distress.   MSK: L-spine: Nontender midline.  Tender palpation left lumbar paraspinal musculature. Decreased lumbar motion pain with lateral flexion and extension. Lower extremity strength is intact. Reflexes are intact. Mild antalgic gait.    Lab and Radiology Results EXAM: SACRUM AND COCCYX - 2+ VIEW   COMPARISON:  None.   FINDINGS: There is buckling of the anterior cortex of the distal sacrum with 3 mm anterior displacement. SI joints are intact with mild-moderate degenerative changes.   IMPRESSION: Minimally displaced distal sacral fracture.     Electronically Signed   By: Davina Poke M.D.   On: 09/08/2019 12:59 I, Lynne Leader, personally (independently) visualized and performed the interpretation of the images attached in this note.      Assessment and Plan: 81 y.o. female with left low back pain thought to be due to muscle spasm and dysfunction.  Pain occurred about 2 days ago without injury.  Most likely muscle group to be affected is the  quadratus lumborum.  She is a good candidate for conservative management.  Treat with physical therapy referral heating pad TENS unit and low-dose tizanidine at bedtime.  Check back in 6 weeks especially if not improved.  We talked about x-rays we can hold off on x-rays for now but if not improved would certainly like to get x-rays.   PDMP not reviewed this encounter. Orders Placed This Encounter  Procedures   Ambulatory referral to Physical Therapy    Referral Priority:   Routine    Referral Type:   Physical Medicine    Referral Reason:   Specialty Services Required    Requested Specialty:   Physical Therapy    Number of Visits Requested:   1   Meds ordered this encounter  Medications   tiZANidine (ZANAFLEX) 2 MG tablet    Sig: Take 1 tablet (2 mg total) by mouth at bedtime.    Dispense:  30 tablet    Refill:  0     Discussed warning signs or symptoms. Please see discharge instructions. Patient expresses understanding.   The above documentation has been reviewed and is accurate and complete Lynne Leader, M.D.

## 2022-09-26 ENCOUNTER — Ambulatory Visit (INDEPENDENT_AMBULATORY_CARE_PROVIDER_SITE_OTHER): Payer: Medicare Other

## 2022-09-26 ENCOUNTER — Encounter: Payer: Self-pay | Admitting: Family Medicine

## 2022-09-26 DIAGNOSIS — Z23 Encounter for immunization: Secondary | ICD-10-CM | POA: Diagnosis not present

## 2022-09-26 NOTE — Progress Notes (Signed)
Gail Lester is a 81 y.o. female presents to the office today for Influenza injections, per physician's orders. Original order:  Flu Shot (med),  (dose), left deltoid (route) was administered  (location) today. Patient tolerated injection.   [name] [name]

## 2022-10-30 NOTE — Progress Notes (Unsigned)
I, Peterson Lombard, LAT, ATC acting as a scribe for Lynne Leader, MD.  Gail Lester is a 81 y.o. female who presents to [place] at [facility] today for her 6-week follow-up of left-sided low back pain.  Patient was last seen by Dr. Georgina Snell on 09/19/2022 and was prescribed tizanidine, and was advised to use a heating pad and TENS unit.  Patient was also referred to PT, but did not complete any visits.  Today, patient reports her LBP is feeling pretty good. Pt did not want pursue PT as her past experience was "awkward."  Pt also c/o R knee that returned over the last few months. Pt was last seen for this issue on 12/27/21 and had a R knee steroid injection on 11/08/21.  Additionally she notes some numbness and tingling occurring in her right hand.  She notes this will wake her from sleep occasionally.  She denies any weakness.  She notes a remote history of carpal tunnel syndrome that was worse when she was working.   Dx imaging: 11/09/21 R knee XR 09/08/19 Sacrum/coccyx XR   Pertinent review of systems: No fevers or chills  Relevant historical information: Diabetes   Exam:  BP 118/72   Pulse 69   Ht '5\' 4"'$  (1.626 m)   Wt 153 lb (69.4 kg)   SpO2 97%   BMI 26.26 kg/m  General: Well Developed, well nourished, and in no acute distress.   MSK: Right knee mild effusion otherwise normal.  Normal motion with crepitation.  Tender palpation medial joint line.  Right hand normal. Nontender. Normal motion and grip strength.  Positive Tinel's and Phalen's test right carpal tunnel. C-spine normal cervical motion.   Lab and Radiology Results  Procedure: Real-time Ultrasound Guided Injection of right knee superior lateral patellar space Device: Philips Affiniti 50G Images permanently stored and available for review in PACS Verbal informed consent obtained.  Discussed risks and benefits of procedure. Warned about infection, bleeding, hyperglycemia damage to structures among  others. Patient expresses understanding and agreement Time-out conducted.   Noted no overlying erythema, induration, or other signs of local infection.   Skin prepped in a sterile fashion.   Local anesthesia: Topical Ethyl chloride.   With sterile technique and under real time ultrasound guidance: 40 milligrams of Kenalog and 2 ml of Marcaine injected into knee joint. Fluid seen entering the joint capsule.   Completed without difficulty   Pain immediately resolved suggesting accurate placement of the medication.   Advised to call if fevers/chills, erythema, induration, drainage, or persistent bleeding.   Images permanently stored and available for review in the ultrasound unit.  Impression: Technically successful ultrasound guided injection.    EXAM: RIGHT KNEE 3 VIEWS   COMPARISON:  None.   FINDINGS: No acute fracture or dislocation. Small joint effusion. Joint spaces are preserved. Bone mineralization is normal. Soft tissues are unremarkable.   IMPRESSION: 1. Small joint effusion. No acute osseous abnormality or significant degenerative changes.     Electronically Signed   By: Titus Dubin M.D.   On: 11/10/2021 10:03  I, Lynne Leader, personally (independently) visualized and performed the interpretation of the images attached in this note.      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  Chronic pain with acute exacerbation today.  Plan for steroid injection.  Last injection was about a year ago.  Additionally she has right hand paresthesias consistent with carpal tunnel syndrome.  Plan for carpal  tunnel wrist brace.  Recheck back as needed.    PDMP not reviewed this encounter. Orders Placed This Encounter  Procedures   [place] LIMITED JOINT SPACE STRUCTURES LOW RIGHT(NO LINKED CHARGES)    Order Specific Question:   Reason for Exam (SYMPTOM  OR DIAGNOSIS REQUIRED)    Answer:   right knee pain    Order Specific Question:   Preferred  imaging location?    Answer:   [facility]   No orders of the defined types were placed in this encounter.    Discussed warning signs or symptoms. Please see discharge instructions. Patient expresses understanding.   The above documentation has been reviewed and is accurate and complete Lynne Leader, M.D.

## 2022-10-31 ENCOUNTER — Ambulatory Visit: Payer: Self-pay

## 2022-10-31 ENCOUNTER — Ambulatory Visit: Payer: Medicare Other | Admitting: Family Medicine

## 2022-10-31 VITALS — BP 118/72 | HR 69 | Ht 64.0 in | Wt 153.0 lb

## 2022-10-31 DIAGNOSIS — G5601 Carpal tunnel syndrome, right upper limb: Secondary | ICD-10-CM

## 2022-10-31 DIAGNOSIS — M25561 Pain in right knee: Secondary | ICD-10-CM | POA: Diagnosis not present

## 2022-10-31 NOTE — Patient Instructions (Addendum)
Thank you for coming in today.   You received an injection today. Seek immediate medical attention if the joint becomes red, extremely painful, or is oozing fluid.   Wear a carpal tunnel wrist brace  Check back as needed

## 2022-12-11 ENCOUNTER — Other Ambulatory Visit (HOSPITAL_BASED_OUTPATIENT_CLINIC_OR_DEPARTMENT_OTHER): Payer: Self-pay

## 2022-12-11 ENCOUNTER — Ambulatory Visit (INDEPENDENT_AMBULATORY_CARE_PROVIDER_SITE_OTHER): Payer: Medicare Other | Admitting: Family Medicine

## 2022-12-11 VITALS — BP 122/70 | HR 77 | Temp 97.5°F | Resp 16 | Ht 63.0 in | Wt 153.2 lb

## 2022-12-11 DIAGNOSIS — R252 Cramp and spasm: Secondary | ICD-10-CM

## 2022-12-11 DIAGNOSIS — M25561 Pain in right knee: Secondary | ICD-10-CM | POA: Diagnosis not present

## 2022-12-11 DIAGNOSIS — E782 Mixed hyperlipidemia: Secondary | ICD-10-CM

## 2022-12-11 DIAGNOSIS — M8580 Other specified disorders of bone density and structure, unspecified site: Secondary | ICD-10-CM

## 2022-12-11 DIAGNOSIS — E119 Type 2 diabetes mellitus without complications: Secondary | ICD-10-CM

## 2022-12-11 DIAGNOSIS — M810 Age-related osteoporosis without current pathological fracture: Secondary | ICD-10-CM | POA: Diagnosis not present

## 2022-12-11 DIAGNOSIS — Z0000 Encounter for general adult medical examination without abnormal findings: Secondary | ICD-10-CM

## 2022-12-11 DIAGNOSIS — G3184 Mild cognitive impairment, so stated: Secondary | ICD-10-CM | POA: Diagnosis not present

## 2022-12-11 MED ORDER — COMIRNATY 30 MCG/0.3ML IM SUSY
PREFILLED_SYRINGE | INTRAMUSCULAR | 0 refills | Status: DC
  Filled 2022-12-11: qty 0.3, 1d supply, fill #0

## 2022-12-11 MED ORDER — AREXVY 120 MCG/0.5ML IM SUSR
INTRAMUSCULAR | 0 refills | Status: DC
  Filled 2022-12-11: qty 1, 1d supply, fill #0

## 2022-12-11 MED ORDER — ALENDRONATE SODIUM 70 MG PO TABS: 70.0000 mg | ORAL_TABLET | ORAL | 11 refills | Status: DC

## 2022-12-11 NOTE — Patient Instructions (Addendum)
Hyland's leg cramp medicine under tongue Tonic water Hydrate  Learn something new   Recommend calcium intake of 1200 to 1500 mg daily, divided into roughly 3 doses. Best source is the diet and a single dairy serving is about 500 mg, a supplement of calcium citrate once or twice daily to balance diet is fine if not getting enough in diet. Also need Vitamin D 2000 IU caps, 1 cap daily if not already taking vitamin D. Also recommend weight baring exercise on hips and upper body to keep bones strong   RSV, Respiratory Syncitial Virus, Arexvy at [place] booster  Preventive Care 65 Years and Older, Female Preventive care refers to lifestyle choices and visits with your health care provider that can promote health and wellness. Preventive care visits are also called wellness exams. What can I expect for my preventive care visit? Counseling Your health care provider may ask you questions about your: Medical history, including: Past medical problems. Family medical history. Pregnancy and menstrual history. History of falls. Current health, including: Memory and ability to understand (cognition). Emotional well-being. Home life and relationship well-being. Sexual activity and sexual health. Lifestyle, including: Alcohol, nicotine or tobacco, and drug use. Access to firearms. Diet, exercise, and sleep habits. Work and work Statistician. Sunscreen use. Safety issues such as seatbelt and bike helmet use. Physical exam Your health care provider will check your: Height and weight. These may be used to calculate your BMI (body mass index). BMI is a measurement that tells if you are at a healthy weight. Waist circumference. This measures the distance around your waistline. This measurement also tells if you are at a healthy weight and may help predict your risk of certain diseases, such as type 2 diabetes and high blood pressure. Heart rate and blood pressure. Body temperature. Skin for  abnormal spots. What immunizations do I need?  Vaccines are usually given at various ages, according to a schedule. Your health care provider will recommend vaccines for you based on your age, medical history, and lifestyle or other factors, such as travel or where you work. What tests do I need? Screening Your health care provider may recommend screening tests for certain conditions. This may include: Lipid and cholesterol levels. Hepatitis C test. Hepatitis B test. HIV (human immunodeficiency virus) test. STI (sexually transmitted infection) testing, if you are at risk. Lung cancer screening. Colorectal cancer screening. Diabetes screening. This is done by checking your blood sugar (glucose) after you have not eaten for a while (fasting). Mammogram. Talk with your health care provider about how often you should have regular mammograms. BRCA-related cancer screening. This may be done if you have a family history of breast, ovarian, tubal, or peritoneal cancers. Bone density scan. This is done to screen for osteoporosis. Talk with your health care provider about your test results, treatment options, and if necessary, the need for more tests. Follow these instructions at home: Eating and drinking  Eat a diet that includes fresh fruits and vegetables, whole grains, lean protein, and low-fat dairy products. Limit your intake of foods with high amounts of sugar, saturated fats, and salt. Take vitamin and mineral supplements as recommended by your health care provider. Do not drink alcohol if your health care provider tells you not to drink. If you drink alcohol: Limit how much you have to 0-1 drink a day. Know how much alcohol is in your drink. In the [place] one drink equals one 12 oz bottle of beer (355 mL), one 5 oz  glass of wine (148 mL), or one 1 oz glass of hard liquor (44 mL). Lifestyle Brush your teeth every morning and night with fluoride toothpaste. Floss one time each  day. Exercise for at least 30 minutes 5 or more days each week. Do not use any products that contain nicotine or tobacco. These products include cigarettes, chewing tobacco, and vaping devices, such as e-cigarettes. If you need help quitting, ask your health care provider. Do not use drugs. If you are sexually active, practice safe sex. Use a condom or other form of protection in order to prevent STIs. Take aspirin only as told by your health care provider. Make sure that you understand how much to take and what form to take. Work with your health care provider to find out whether it is safe and beneficial for you to take aspirin daily. Ask your health care provider if you need to take a cholesterol-lowering medicine (statin). Find healthy ways to manage stress, such as: Meditation, yoga, or listening to music. Journaling. Talking to a trusted person. Spending time with friends and family. Minimize exposure to UV radiation to reduce your risk of skin cancer. Safety Always wear your seat belt while driving or riding in a vehicle. Do not drive: If you have been drinking alcohol. Do not ride with someone who has been drinking. When you are tired or distracted. While texting. If you have been using any mind-altering substances or drugs. Wear a helmet and other protective equipment during sports activities. If you have firearms in your house, make sure you follow all gun safety procedures. What's next? Visit your health care provider once a year for an annual wellness visit. Ask your health care provider how often you should have your eyes and teeth checked. Stay up to date on all vaccines. This information is not intended to replace advice given to you by your health care provider. Make sure you discuss any questions you have with your health care provider. Document Revised: 06/07/2021 Document Reviewed: 06/07/2021 Elsevier Patient Education  [place].

## 2022-12-11 NOTE — Progress Notes (Signed)
Subjective:   By signing my name below, I, Kellie Simmering, attest that this documentation has been prepared under the direction and in the presence of Mosie Lukes, MD., 12/11/2022.   Patient ID: Gail Lester, female    DOB: 1941/11/25, 81 y.o.   MRN: [id number]  No chief complaint on file.  HPI Patient is in today for a comprehensive physical exam and follow up on chronic medical concerns. She denies CP/palpitations/SOB/HA/ congestion/fevers/GI or GU c/o.  Arthritis (Right Knee) Patient reports that her right knee has been hurting again due to arthritis. She describes the pain as acute, sharp, but not constant. She was seen by Dr. Georgina Snell on 10/31/2022 who reported a small joint effusion and administered a steroid injection. She has not experienced pain in the knee since the injection and reports that her last steroid injection was on 11/08/2021.  Blood Glucose She does not check her blood glucose regularly. Her diet is not balanced and she does drink coffee and soda often. Lab Results  Component Value Date   HGBA1C 7.8 (H) 06/05/2022   Family History No changes to the FHx.  Memory Loss She complains of memory loss and confusion. 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 She states that she saw a neuropsychologist about this several years ago.  Muscle Cramps She struggles to remain hydrated and often experiences muscle cramps due to dehydration. She states that these cramps are causing her to cry from intense pain. She drinks coffee frequently and is not willing to reduce this. She lives alone and remains active by doing household tasks and walking her dog.   Osteopenia She is interested in taking Fosamax to manage her osteopenia.  Supplements She currently takes calcium and vitamin D 2000 IU daily.  Vision Patient reports that her night vision has recently become much worse. She is not currently managing this with an ophthalmologist.   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   Epidermal cyst 01/21/2017   Fibrocystic breast 02/10/2013   Left h/o FN biopsy    Ganglion cyst 11/14/2014   Right 2nd finger   GERD (gastroesophageal reflux disease) 10/09/2020   Hemangioma 09/28/2014   History of cataracts    Previous surgery to remove bilaterally   History of chicken pox    as a child   History of colonic polyp 11/21/2017   History of measles    as a child   History of mumps    as a child   Hypercholesteremia    Hyperlipidemia, mixed 02/10/2013   Melanocytic nevi, unspecified 09/28/2014   Mild neurocognitive disorder, unclear etiology 11/21/2020   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 08/14/2015   Onychomycosis 02/24/2017   Osteopenia 05/23/2017   Seborrheic keratosis 09/28/2014   Type 2 diabetes mellitus 02/10/2013   Past Surgical History:  Procedure Laterality Date   APPENDECTOMY  1955   BREAST BIOPSY  1985-1990   benign   EYE SURGERY Right 04/2019   cataract removal   EYE SURGERY Left 03/2020   cataract removal   ORIF WRIST FRACTURE  12/09/2011   Procedure: OPEN REDUCTION INTERNAL FIXATION (ORIF) WRIST FRACTURE;  Surgeon: Linna Hoff;  Location: [facility];  Service: Orthopedics;  Laterality: Left;   Family History  Problem Relation Age of Onset   Kidney disease Mother    Alcoholism Mother    Diabetes Father  Kidney disease Maternal Grandmother    Cancer Maternal Grandmother        bladder   Cancer Maternal Aunt    Alzheimer's disease Maternal Aunt    Scoliosis Son    Colon cancer Other        grandparent   Social History   Socioeconomic History   Marital status: Divorced    Spouse name: Not on file   Number of children: 2   Years of education: 13   Highest education level: Some college, no degree  Occupational History   Occupation: Retired Nurse, learning disability  Tobacco Use   Smoking status: Former    Years: 15.00     Types: Cigarettes    Quit date: 11/17/1989    Years since quitting: 33.0   Smokeless tobacco: Never  Substance and Sexual Activity   Alcohol use: No    Comment: rarely while in social settings   Drug use: No   Sexual activity: Not Currently  Other Topics Concern   Not on file  Social History Narrative   Not on file   Social Determinants of Health   Financial Resource Strain: Low Risk  (01/10/2022)   Overall Financial Resource Strain (CARDIA)    Difficulty of Paying Living Expenses: Not hard at all  Food Insecurity: No [place] (04/16/2022)   Hunger Vital Sign    Worried About Running Out of Food in the Last Year: Never true    [place] in the Last Year: Never true  Transportation Needs: No Transportation Needs (01/10/2022)   PRAPARE - Hydrologist (Medical): No    Lack of Transportation (Non-Medical): No  Physical Activity: Sufficiently Active (01/10/2022)   Exercise Vital Sign    Days of Exercise per Week: 7 days    Minutes of Exercise per Session: 30 min  Stress: No Stress Concern Present (04/16/2022)   Kay    Feeling of Stress : Not at all  Social Connections: Moderately Isolated (04/16/2022)   Social Connection and Isolation Panel [NHANES]    Frequency of Communication with Friends and Family: More than three times a week    Frequency of Social Gatherings with Friends and Family: Never    Attends Religious Services: Never    Marine scientist or Organizations: Yes    Attends Archivist Meetings: Never    Marital Status: Divorced  Human resources officer Violence: Not At Risk (04/16/2022)   Humiliation, Afraid, Rape, and Kick questionnaire    Fear of Current or Ex-Partner: No    Emotionally Abused: No    Physically Abused: No    Sexually Abused: No   Outpatient Medications Prior to Visit  Medication Sig Dispense Refill   ACCU-CHEK AVIVA PLUS test  strip Use as instructed 100 each 11   Accu-Chek Softclix Lancets lancets Use as instructed 100 each 12   Apoaequorin (PREVAGEN PO) Take 1 tablet by mouth daily.     aspirin 81 MG tablet Take 81 mg by mouth daily.     Blood Glucose Monitoring Suppl (ACCU-CHEK AVIVA PLUS) w/Device KIT Use as directed 1 kit 0   CALCIUM PO Take 630 mg by mouth daily. Taking 2 tablets of calcium citrate 315 each - 675m total     Cholecalciferol (VITAMIN D3) 50 MCG (2000 UT) TABS Take 1 tablet by mouth daily.     Co-Enzyme Q10 100 MG CAPS Take 1 capsule by mouth daily.  glimepiride (AMARYL) 4 MG tablet Take 1 tablet (4 mg total) by mouth daily with breakfast. 90 tablet 1   glucosamine-chondroitin 500-400 MG tablet Take 1 tablet by mouth daily.     Iron-Vitamin C (IRON 100/C PO) Take 1 tablet by mouth daily.     Krill Oil 500 MG CAPS Take 1 capsule by mouth daily.     Lancets Misc. (ACCU-CHEK FASTCLIX LANCET) KIT Use to check sugar 4 times a day.  Dx code: E11.9 1 kit 0   losartan (COZAAR) 25 MG tablet Take 1 tablet (25 mg total) by mouth daily. 90 tablet 3   LUTEIN PO Take 25 mg by mouth daily.     metFORMIN (GLUCOPHAGE-XR) 500 MG 24 hr tablet TAKE 1 TABLET BY MOUTH  DAILY 90 tablet 3   metoprolol tartrate (LOPRESSOR) 100 MG tablet Take tablet TWO hours prior to your cardiac CT scan. 1 tablet 0   Multiple Vitamin (MULTIVITAMIN) tablet Take 1 tablet by mouth daily.      rosuvastatin (CRESTOR) 20 MG tablet TAKE 1 TABLET BY MOUTH DAILY 90 tablet 3   tiZANidine (ZANAFLEX) 2 MG tablet Take 1 tablet (2 mg total) by mouth at bedtime. 30 tablet 0   vitamin C (ASCORBIC ACID) 500 MG tablet Take 500 mg by mouth daily.     No facility-administered medications prior to visit.   No Known Allergies  Review of Systems  Constitutional:  Negative for chills and fever.  HENT:  Negative for congestion.   Eyes:        (+) worsening night vision  Respiratory:  Negative for shortness of breath.   Cardiovascular:  Negative for  chest pain and palpitations.  Gastrointestinal:  Negative for abdominal pain, blood in stool, constipation, diarrhea, nausea and vomiting.  Genitourinary:  Negative for dysuria, frequency, hematuria and urgency.  Skin:           Neurological:  Negative for headaches.       (+) memory loss (+) confusion      Objective:    Physical Exam Constitutional:      General: She is not in acute distress.    Appearance: Normal appearance. She is normal weight. She is not ill-appearing.  HENT:     Head: Normocephalic and atraumatic.     Right Ear: Tympanic membrane, ear canal and external ear normal.     Left Ear: Tympanic membrane, ear canal and external ear normal.     Nose: Nose normal.     Mouth/Throat:     Mouth: Mucous membranes are moist.     Pharynx: Oropharynx is clear.  Eyes:     General:        Right eye: No discharge.        Left eye: No discharge.     Extraocular Movements: Extraocular movements intact.     Right eye: No nystagmus.     Left eye: No nystagmus.     Pupils: Pupils are equal, round, and reactive to light.  Neck:     Vascular: No carotid bruit.  Cardiovascular:     Rate and Rhythm: Normal rate and regular rhythm.     Pulses: Normal pulses.     Heart sounds: Normal heart sounds. No murmur heard.    No gallop.  Pulmonary:     Effort: Pulmonary effort is normal. No respiratory distress.     Breath sounds: Normal breath sounds. No wheezing or rales.  Abdominal:     General: Bowel sounds are normal.  Palpations: Abdomen is soft.     Tenderness: There is no abdominal tenderness. There is no guarding.  Musculoskeletal:        General: Normal range of motion.     Cervical back: Normal range of motion.     Right lower leg: No edema.     Left lower leg: No edema.     Comments: Muscle strength 5/5 on upper and lower extremities.   Lymphadenopathy:     Cervical: No cervical adenopathy.  Skin:    General: Skin is warm and dry.  Neurological:     Mental  Status: She is alert and oriented to person, place, and time.     Sensory: Sensation is intact.     Motor: Motor function is intact.     Coordination: Coordination is intact.     Deep Tendon Reflexes:     Reflex Scores:      Patellar reflexes are 2+ on the right side and 2+ on the left side. Psychiatric:        Mood and Affect: Mood normal.        Behavior: Behavior normal.        Judgment: Judgment normal.    There were no vitals taken for this visit. Wt Readings from Last 3 Encounters:  10/31/22 153 lb (69.4 kg)  09/19/22 153 lb (69.4 kg)  06/06/22 151 lb 3.2 oz (68.6 kg)   Diabetic Foot Exam - Simple   No data filed    Lab Results  Component Value Date   WBC 5.8 06/05/2022   HGB 12.9 06/05/2022   HCT 38.6 06/05/2022   PLT 285.0 06/05/2022   GLUCOSE 91 06/05/2022   CHOL 157 06/05/2022   TRIG 146.0 06/05/2022   HDL 55.00 06/05/2022   LDLDIRECT 83.0 04/10/2021   LDLCALC 73 06/05/2022   ALT 17 06/05/2022   AST 21 06/05/2022   NA 139 06/05/2022   K 4.3 06/05/2022   CL 104 06/05/2022   CREATININE 0.68 06/05/2022   BUN 13 06/05/2022   CO2 27 06/05/2022   TSH 2.01 06/05/2022   HGBA1C 7.8 (H) 06/05/2022   MICROALBUR 1.6 06/05/2022   Lab Results  Component Value Date   TSH 2.01 06/05/2022   Lab Results  Component Value Date   WBC 5.8 06/05/2022   HGB 12.9 06/05/2022   HCT 38.6 06/05/2022   MCV 90.6 06/05/2022   PLT 285.0 06/05/2022   Lab Results  Component Value Date   NA 139 06/05/2022   K 4.3 06/05/2022   CO2 27 06/05/2022   GLUCOSE 91 06/05/2022   BUN 13 06/05/2022   CREATININE 0.68 06/05/2022   BILITOT 0.5 06/05/2022   ALKPHOS 50 06/05/2022   AST 21 06/05/2022   ALT 17 06/05/2022   PROT 6.5 06/05/2022   ALBUMIN 4.0 06/05/2022   CALCIUM 9.6 06/05/2022   GFR 81.96 06/05/2022   Lab Results  Component Value Date   CHOL 157 06/05/2022   Lab Results  Component Value Date   HDL 55.00 06/05/2022   Lab Results  Component Value Date   LDLCALC  73 06/05/2022   Lab Results  Component Value Date   TRIG 146.0 06/05/2022   Lab Results  Component Value Date   CHOLHDL 3 06/05/2022   Lab Results  Component Value Date   HGBA1C 7.8 (H) 06/05/2022      Assessment & Plan:  Colonoscopy: Last completed on 07/18/2010.  DEXA: Last completed on 04/19/2022. This patient is considered osteopenic according to World  Health Organization [facility]) criteria. Repeat in 5 years.  Mammogram: Last completed on 04/11/2020 with no mammographic evidence of malignancy.  Cramps: Recommended Hyland's leg cramp otc medication and tonic water.  Healthy Lifestyle: Encouraged exercise, heart healthy diet and hydration.  Immunizations: Patient will receive a COVID-19 booster after today's visit. Encouraged RSV immunizations.  Labs: Routine blood work will be completed today.  Osteopenia: Fosamax prescribed.  Supplements: Encouraged 1500 mg of calcium daily.  Problem List Items Addressed This Visit   None  No orders of the defined types were placed in this encounter.  I, Kellie Simmering, personally preformed the services described in this documentation.  All medical record entries made by the scribe were at my direction and in my presence.  I have reviewed the chart and discharge instructions (if applicable) and agree that the record reflects my personal performance and is accurate and complete. 12/11/2022  I,Mohammed Iqbal,acting as a scribe for Penni Homans, MD.,have documented all relevant documentation on the behalf of Penni Homans, MD,as directed by  Penni Homans, MD while in the presence of Penni Homans, MD.  Kellie Simmering

## 2022-12-12 LAB — COMPREHENSIVE METABOLIC PANEL
ALT: 19 U/L (ref 0–35)
AST: 22 U/L (ref 0–37)
Albumin: 4.2 g/dL (ref 3.5–5.2)
Alkaline Phosphatase: 51 U/L (ref 39–117)
BUN: 15 mg/dL (ref 6–23)
CO2: 28 mEq/L (ref 19–32)
Calcium: 10.5 mg/dL (ref 8.4–10.5)
Chloride: 102 mEq/L (ref 96–112)
Creatinine, Ser: 0.74 mg/dL (ref 0.40–1.20)
GFR: 75.86 mL/min (ref >60.00)
Glucose, Bld: 55 mg/dL — ABNORMAL LOW (ref 70–99)
Potassium: 3.8 mEq/L (ref 3.5–5.1)
Sodium: 141 mEq/L (ref 135–145)
Total Bilirubin: 0.5 mg/dL (ref 0.2–1.2)
Total Protein: 6.7 g/dL (ref 6.0–8.3)

## 2022-12-12 LAB — LIPID PANEL
Cholesterol: 147 mg/dL (ref 0–200)
HDL: 56.5 mg/dL (ref >39.00)
LDL Cholesterol: 62 mg/dL (ref 0–99)
NonHDL: 90.98
Total CHOL/HDL Ratio: 3
Triglycerides: 145 mg/dL (ref 0.0–149.0)
VLDL: 29 mg/dL (ref 0.0–40.0)

## 2022-12-12 LAB — CBC WITH DIFFERENTIAL/PLATELET
Basophils Absolute: 0.1 10*3/uL (ref 0.0–0.1)
Basophils Relative: 0.7 % (ref 0.0–3.0)
Eosinophils Absolute: 0.2 10*3/uL (ref 0.0–0.7)
Eosinophils Relative: 1.8 % (ref 0.0–5.0)
HCT: 39.3 % (ref 36.0–46.0)
Hemoglobin: 13.1 g/dL (ref 12.0–15.0)
Lymphocytes Relative: 26.8 % (ref 12.0–46.0)
Lymphs Abs: 2.3 10*3/uL (ref 0.7–4.0)
MCHC: 33.4 g/dL (ref 30.0–36.0)
MCV: 92.1 fl (ref 78.0–100.0)
Monocytes Absolute: 0.6 10*3/uL (ref 0.1–1.0)
Monocytes Relative: 7.2 % (ref 3.0–12.0)
Neutro Abs: 5.5 10*3/uL (ref 1.4–7.7)
Neutrophils Relative %: 63.5 % (ref 43.0–77.0)
Platelets: 302 10*3/uL (ref 150.0–400.0)
RBC: 4.27 Mil/uL (ref 3.87–5.11)
RDW: 13.1 % (ref 11.5–15.5)
WBC: 8.6 10*3/uL (ref 4.0–10.5)

## 2022-12-12 LAB — MICROALBUMIN / CREATININE URINE RATIO
Creatinine,U: 116.6 mg/dL
Microalb Creat Ratio: 1.5 mg/g (ref 0.0–30.0)
Microalb, Ur: 1.7 mg/dL (ref 0.0–1.9)

## 2022-12-12 LAB — VITAMIN D 25 HYDROXY (VIT D DEFICIENCY, FRACTURES): VITD: 73.17 ng/mL (ref 30.00–100.00)

## 2022-12-12 LAB — TSH: TSH: 1.98 u[IU]/mL (ref 0.35–5.50)

## 2022-12-12 LAB — HEMOGLOBIN A1C: Hgb A1c MFr Bld: 8.2 % — ABNORMAL HIGH (ref 4.6–6.5)

## 2022-12-12 LAB — MAGNESIUM: Magnesium: 1.8 mg/dL (ref 1.5–2.5)

## 2022-12-12 NOTE — Assessment & Plan Note (Signed)
Tolerating statin, encouraged heart healthy diet, avoid trans fats, minimize simple carbs and saturated fats. Increase exercise as tolerated 

## 2022-12-12 NOTE — Assessment & Plan Note (Signed)
hgba1c acceptable, minimize simple carbs. Increase exercise as tolerated. Continue current meds 

## 2022-12-12 NOTE — Assessment & Plan Note (Signed)
Hydrate and monitor 

## 2022-12-12 NOTE — Addendum Note (Signed)
Addended by: Penni Homans A on: 12/12/2022 11:00 AM   Modules accepted: Level of Service

## 2022-12-12 NOTE — Assessment & Plan Note (Signed)
Start on Fosamax 70 mg po q week. Recommend calcium intake of 1200 to 1500 mg daily, divided into roughly 3 doses. Best source is the diet and a single dairy serving is about 500 mg, a supplement of calcium citrate once or twice daily to balance diet is fine if not getting enough in diet. Also need Vitamin D 2000 IU caps, 1 cap daily if not already taking vitamin D. Also recommend weight baring exercise on hips and upper body to keep bones strong

## 2022-12-12 NOTE — Assessment & Plan Note (Signed)
Can alternate ice and moist heat and gentle stretching as tolerated. May try NSAIDs and prescription meds as directed and report if symptoms worsen or seek immediate care.

## 2022-12-12 NOTE — Assessment & Plan Note (Signed)
Patient encouraged to maintain heart healthy diet, regular exercise, adequate sleep. Consider daily probiotics. Take medications as prescribed. Labs ordered and reviewed. Has aged out of colonoscopies, paps and MGM was last done in  April 2021 can repeat every 2 years at her discretion and encouraged to continue Dexa scans every few years. Colonoscopy aged out Paps aged out

## 2022-12-20 ENCOUNTER — Other Ambulatory Visit: Payer: Self-pay | Admitting: Family Medicine

## 2022-12-20 DIAGNOSIS — M25561 Pain in right knee: Secondary | ICD-10-CM

## 2023-01-17 ENCOUNTER — Other Ambulatory Visit: Payer: Self-pay | Admitting: Family Medicine

## 2023-03-24 ENCOUNTER — Other Ambulatory Visit: Payer: Self-pay | Admitting: Family Medicine

## 2023-04-05 ENCOUNTER — Telehealth: Payer: Self-pay | Admitting: Family Medicine

## 2023-04-05 NOTE — Telephone Encounter (Signed)
Copied from CRM [id number]. Topic: Medicare AWV >> Apr 05, 2023  2:53 PM Payton Doughty wrote: Reason for CRM: Called patient to schedule Medicare Annual Wellness Visit (AWV). Left message for patient to call back and schedule Medicare Annual Wellness Visit (AWV).  Last date of AWV: 04/07/22  Please schedule an appointment at any time with Donne Anon, CMA  .  If any questions, please contact me.  Thank you ,  Verlee Rossetti; Care Guide Ambulatory Clinical Support [place] [facility] Direct Dial: [phone]

## 2023-04-13 ENCOUNTER — Encounter: Payer: Self-pay | Admitting: Family Medicine

## 2023-04-15 ENCOUNTER — Other Ambulatory Visit: Payer: Self-pay | Admitting: Family Medicine

## 2023-04-15 MED ORDER — METFORMIN HCL ER 500 MG PO TB24: 500.0000 mg | ORAL_TABLET | Freq: Three times a day (TID) | ORAL | 1 refills | Status: DC

## 2023-05-07 ENCOUNTER — Other Ambulatory Visit: Payer: Self-pay

## 2023-05-07 MED ORDER — METFORMIN HCL ER 500 MG PO TB24: 500.0000 mg | ORAL_TABLET | Freq: Three times a day (TID) | ORAL | 2 refills | Status: DC

## 2023-05-21 ENCOUNTER — Other Ambulatory Visit: Payer: Self-pay

## 2023-05-21 ENCOUNTER — Telehealth: Payer: Self-pay | Admitting: Family Medicine

## 2023-05-21 MED ORDER — GLUCOSAMINE-CHONDROITIN 500-400 MG PO TABS: 1.0000 | ORAL_TABLET | Freq: Every day | ORAL | 2 refills | Status: DC

## 2023-05-21 MED ORDER — LOSARTAN POTASSIUM 25 MG PO TABS: 25.0000 mg | ORAL_TABLET | Freq: Every day | ORAL | 2 refills | Status: DC

## 2023-05-21 MED ORDER — METFORMIN HCL 500 MG PO TABS: 500.0000 mg | ORAL_TABLET | Freq: Three times a day (TID) | ORAL | 2 refills | Status: DC

## 2023-05-21 NOTE — Telephone Encounter (Signed)
Pt states there was not enough information for ins to approve her metformin. So she had dropped off some information to be completed.

## 2023-05-21 NOTE — Telephone Encounter (Signed)
Medication was resent

## 2023-05-29 ENCOUNTER — Other Ambulatory Visit: Payer: Self-pay

## 2023-05-29 ENCOUNTER — Telehealth: Payer: Self-pay | Admitting: Family Medicine

## 2023-05-29 MED ORDER — METFORMIN HCL ER 500 MG PO TB24: 500.0000 mg | ORAL_TABLET | Freq: Three times a day (TID) | ORAL | 2 refills | Status: AC

## 2023-05-29 MED ORDER — METFORMIN HCL 500 MG PO TABS: 500.0000 mg | ORAL_TABLET | Freq: Three times a day (TID) | ORAL | 2 refills | Status: DC

## 2023-05-29 NOTE — Telephone Encounter (Signed)
Medication printed and pt picked up script

## 2023-05-29 NOTE — Addendum Note (Signed)
Addended by: Maximino Sarin on: 05/29/2023 11:21 AM   Modules accepted: Orders

## 2023-05-29 NOTE — Telephone Encounter (Signed)
Called [facility] pharmacy clarification it was Metformin xl 500 mg

## 2023-05-29 NOTE — Telephone Encounter (Signed)
[facility] pharmacy called to get clarification on the metformin prescription. There are two different prescriptions for same meds, diff. Qty. Please call to advise [phone]

## 2023-07-01 ENCOUNTER — Ambulatory Visit: Payer: Medicare Other

## 2023-07-01 NOTE — Assessment & Plan Note (Signed)
Tolerating statin, encouraged heart healthy diet, avoid trans fats, minimize simple carbs and saturated fats. Increase exercise as tolerated 

## 2023-07-01 NOTE — Assessment & Plan Note (Signed)
hgba1c acceptable, minimize simple carbs. Increase exercise as tolerated. Continue current meds 

## 2023-07-01 NOTE — Progress Notes (Unsigned)
Subjective:    Patient ID: Gail Lester, female    DOB: Jan 20, 1941, 82 y.o.   MRN: [id number]  Chief Complaint  Patient presents with   Follow-up    Follow up    HPI Discussed the use of AI scribe software for clinical note transcription with the patient, who gave verbal consent to proceed.  History of Present Illness   The patient, with a history of diabetes, presents with unexplained bruises on their back and shoulders, memory loss, and unexplained weight loss. They report a fall that they do not remember and feeling unsteady. The patient also mentions waking up in an unusual position on their bed, suggesting a possible fall or loss of consciousness. They report no headache, nausea, or vision changes. The patient also mentions an incident of wandering to their neighbor's house at an unusual hour and not remembering the event. They report feeling a little unsteady and having achy ankles. The patient also reports a significant weight loss of 15 pounds without trying, which they attribute to a change in diet to mostly vegetables. They report no changes in bowel movements, nausea, or vomiting. The patient also mentions feeling energetic and lucid.     Patient is an 82 yo female in for follow up on chronic medical concerns. No recent febrile illness or hospitalizations. Denies CP/palp/SOB/HA/congestion/fevers/GI or GU c/o. Taking meds as prescribed    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   Epidermal cyst 01/21/2017   Fibrocystic breast 02/10/2013   Left h/o FN biopsy    Ganglion cyst 11/14/2014   Right 2nd finger   GERD (gastroesophageal reflux disease) 10/09/2020   Hemangioma 09/28/2014   History of cataracts    Previous surgery to remove bilaterally   History of chicken pox    as a child   History of colonic polyp 11/21/2017   History of measles    as a child   History of mumps    as a  child   Hypercholesteremia    Hyperlipidemia, mixed 02/10/2013   Melanocytic nevi, unspecified 09/28/2014   Mild neurocognitive disorder, unclear etiology 11/21/2020   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 08/14/2015   Onychomycosis 02/24/2017   Osteopenia 05/23/2017   Seborrheic keratosis 09/28/2014   Type 2 diabetes mellitus 02/10/2013    Past Surgical History:  Procedure Laterality Date   APPENDECTOMY  1955   BREAST BIOPSY  1985-1990   benign   EYE SURGERY Right 04/2019   cataract removal   EYE SURGERY Left 03/2020   cataract removal   ORIF WRIST FRACTURE  12/09/2011   Procedure: OPEN REDUCTION INTERNAL FIXATION (ORIF) WRIST FRACTURE;  Surgeon: Sharma Covert;  Location: [facility] OR;  Service: Orthopedics;  Laterality: Left;    Family History  Problem Relation Age of Onset   Kidney disease Mother    Alcoholism Mother    Diabetes Father    Kidney disease Maternal Grandmother    Cancer Maternal Grandmother        bladder   Cancer Maternal Aunt    Alzheimer's disease Maternal Aunt    Scoliosis Son    Colon cancer Other        grandparent    Social History   Socioeconomic History   Marital status: Divorced    Spouse name: Not on file   Number of children: 2   Years of education: 54  Highest education level: Associate degree: academic program  Occupational History   Occupation: Retired Therapist, nutritional  Tobacco Use   Smoking status: Former    Years: 15    Types: Cigarettes    Quit date: 11/17/1989    Years since quitting: 33.6   Smokeless tobacco: Never  Substance and Sexual Activity   Alcohol use: No    Comment: rarely while in social settings   Drug use: No   Sexual activity: Not Currently  Other Topics Concern   Not on file  Social History Narrative   Not on file   Social Determinants of Health   Financial Resource Strain: Low Risk  (06/25/2023)   Overall Financial Resource Strain (CARDIA)    Difficulty of Paying Living Expenses: Not very hard  Food Insecurity: No Food  Insecurity (06/25/2023)   Hunger Vital Sign    Worried About Running Out of Food in the Last Year: Never true    Ran Out of Food in the Last Year: Never true  Transportation Needs: No Transportation Needs (06/25/2023)   PRAPARE - Administrator, Civil Service (Medical): No    Lack of Transportation (Non-Medical): No  Physical Activity: Sufficiently Active (06/25/2023)   Exercise Vital Sign    Days of Exercise per Week: 7 days    Minutes of Exercise per Session: 30 min  Stress: Stress Concern Present (06/25/2023)   [employer] of Occupational Health - Occupational Stress Questionnaire    Feeling of Stress : To some extent  Social Connections: Socially Isolated (06/25/2023)   Social Connection and Isolation Panel [NHANES]    Frequency of Communication with Friends and Family: Once a week    Frequency of Social Gatherings with Friends and Family: Never    Attends Religious Services: Never    Database administrator or Organizations: No    Attends Engineer, structural: Not on file    Marital Status: Divorced  Intimate Partner Violence: Not At Risk (04/16/2022)   Humiliation, Afraid, Rape, and Kick questionnaire    Fear of Current or Ex-Partner: No    Emotionally Abused: No    Physically Abused: No    Sexually Abused: No    Outpatient Medications Prior to Visit  Medication Sig Dispense Refill   ACCU-CHEK GUIDE test strip USE AS DIRECTED 300 strip 2   Accu-Chek Softclix Lancets lancets USE AS DIRECTED 300 each 2   Apoaequorin (PREVAGEN PO) Take 1 tablet by mouth daily.     aspirin 81 MG tablet Take 81 mg by mouth daily.     Blood Glucose Monitoring Suppl (ACCU-CHEK AVIVA PLUS) w/Device KIT Use as directed 1 kit 0   CALCIUM PO Take 630 mg by mouth daily. Taking 2 tablets of calcium citrate 315 each - 630mg  total     Cholecalciferol (VITAMIN D3) 50 MCG (2000 UT) TABS Take 1 tablet by mouth daily.     Co-Enzyme Q10 100 MG CAPS Take 1 capsule by mouth daily.     COVID-19  mRNA vaccine 2023-2024 (COMIRNATY) syringe Inject into the muscle. 0.3 mL 0   glimepiride (AMARYL) 4 MG tablet TAKE 1 TABLET BY MOUTH DAILY  WITH BREAKFAST 100 tablet 2   glucosamine-chondroitin 500-400 MG tablet Take 1 tablet by mouth daily. 90 tablet 2   Iron-Vitamin C (IRON 100/C PO) Take 1 tablet by mouth daily.     Krill Oil 500 MG CAPS Take 1 capsule by mouth daily.     Lancets Misc. (ACCU-CHEK FASTCLIX LANCET)  KIT Use to check sugar 4 times a day.  Dx code: E11.9 1 kit 0   losartan (COZAAR) 25 MG tablet Take 1 tablet (25 mg total) by mouth daily. 100 tablet 2   LUTEIN PO Take 25 mg by mouth daily.     metFORMIN (GLUCOPHAGE-XR) 500 MG 24 hr tablet Take 1 tablet (500 mg total) by mouth in the morning, at noon, and at bedtime. 270 tablet 2   Multiple Vitamin (MULTIVITAMIN) tablet Take 1 tablet by mouth daily.      rosuvastatin (CRESTOR) 20 MG tablet TAKE 1 TABLET BY MOUTH DAILY 90 tablet 3   vitamin C (ASCORBIC ACID) 500 MG tablet Take 500 mg by mouth daily.     alendronate (FOSAMAX) 70 MG tablet Take 1 tablet (70 mg total) by mouth every 7 (seven) days. Take with a full glass of water on an empty stomach. 4 tablet 11   No facility-administered medications prior to visit.    No Known Allergies  Review of Systems  Constitutional:  Negative for fever and malaise/fatigue.  HENT:  Negative for congestion.   Eyes:  Negative for blurred vision.  Respiratory:  Negative for shortness of breath.   Cardiovascular:  Negative for chest pain, palpitations and leg swelling.  Gastrointestinal:  Negative for abdominal pain, blood in stool and nausea.  Genitourinary:  Negative for dysuria and frequency.  Musculoskeletal:  Negative for falls.  Skin:  Negative for rash.  Neurological:  Positive for loss of consciousness and weakness. Negative for dizziness and headaches.  Endo/Heme/Allergies:  Negative for environmental allergies.  Psychiatric/Behavioral:  Positive for memory loss. Negative for  depression. The patient is nervous/anxious.        Objective:    Physical Exam Constitutional:      General: She is not in acute distress.    Appearance: Normal appearance. She is well-developed. She is not toxic-appearing.  HENT:     Head: Normocephalic and atraumatic.     Right Ear: External ear normal.     Left Ear: External ear normal.     Nose: Nose normal.  Eyes:     General:        Right eye: No discharge.        Left eye: No discharge.     Conjunctiva/sclera: Conjunctivae normal.  Neck:     Thyroid: No thyromegaly.  Cardiovascular:     Rate and Rhythm: Normal rate and regular rhythm.     Heart sounds: Normal heart sounds. No murmur heard. Pulmonary:     Effort: Pulmonary effort is normal. No respiratory distress.     Breath sounds: Normal breath sounds.  Abdominal:     General: Bowel sounds are normal.     Palpations: Abdomen is soft.     Tenderness: There is no abdominal tenderness. There is no guarding.  Musculoskeletal:        General: Normal range of motion.     Cervical back: Neck supple.  Lymphadenopathy:     Cervical: No cervical adenopathy.  Skin:    General: Skin is warm and dry.  Neurological:     General: No focal deficit present.     Mental Status: She is alert and oriented to person, place, and time.     Cranial Nerves: No cranial nerve deficit.     Motor: No weakness.     Gait: Gait normal.  Psychiatric:        Mood and Affect: Mood normal.  Behavior: Behavior normal.        Thought Content: Thought content normal.        Judgment: Judgment normal.     BP 120/70 (BP Location: Left Arm, Patient Position: Sitting, Cuff Size: Normal)   Pulse 71   Temp 98 F (36.7 C) (Oral)   Resp 16   Ht 5\' 3"  (1.6 m)   Wt 134 lb (60.8 kg)   SpO2 96%   BMI 23.74 kg/m  Wt Readings from Last 3 Encounters:  07/02/23 134 lb (60.8 kg)  12/11/22 153 lb 3.2 oz (69.5 kg)  10/31/22 153 lb (69.4 kg)    Diabetic Foot Exam - Simple   No data filed     Lab Results  Component Value Date   WBC 8.6 12/11/2022   HGB 13.1 12/11/2022   HCT 39.3 12/11/2022   PLT 302.0 12/11/2022   GLUCOSE 55 (L) 12/11/2022   CHOL 147 12/11/2022   TRIG 145.0 12/11/2022   HDL 56.50 12/11/2022   LDLDIRECT 83.0 04/10/2021   LDLCALC 62 12/11/2022   ALT 19 12/11/2022   AST 22 12/11/2022   NA 141 12/11/2022   K 3.8 12/11/2022   CL 102 12/11/2022   CREATININE 0.74 12/11/2022   BUN 15 12/11/2022   CO2 28 12/11/2022   TSH 1.98 12/11/2022   HGBA1C 8.2 (H) 12/11/2022   MICROALBUR 1.7 12/11/2022    Lab Results  Component Value Date   TSH 1.98 12/11/2022   Lab Results  Component Value Date   WBC 8.6 12/11/2022   HGB 13.1 12/11/2022   HCT 39.3 12/11/2022   MCV 92.1 12/11/2022   PLT 302.0 12/11/2022   Lab Results  Component Value Date   NA 141 12/11/2022   K 3.8 12/11/2022   CO2 28 12/11/2022   GLUCOSE 55 (L) 12/11/2022   BUN 15 12/11/2022   CREATININE 0.74 12/11/2022   BILITOT 0.5 12/11/2022   ALKPHOS 51 12/11/2022   AST 22 12/11/2022   ALT 19 12/11/2022   PROT 6.7 12/11/2022   ALBUMIN 4.2 12/11/2022   CALCIUM 10.5 12/11/2022   GFR 75.86 12/11/2022   Lab Results  Component Value Date   CHOL 147 12/11/2022   Lab Results  Component Value Date   HDL 56.50 12/11/2022   Lab Results  Component Value Date   LDLCALC 62 12/11/2022   Lab Results  Component Value Date   TRIG 145.0 12/11/2022   Lab Results  Component Value Date   CHOLHDL 3 12/11/2022   Lab Results  Component Value Date   HGBA1C 8.2 (H) 12/11/2022       Assessment & Plan:  Type 2 diabetes mellitus without complication, unspecified whether long term insulin use (HCC) Assessment & Plan: hgba1c acceptable, minimize simple carbs. Increase exercise as tolerated. Continue current meds  Orders: -     Hemoglobin A1c -     TSH -     Ambulatory referral to Cardiology  Hyperlipidemia, mixed Assessment & Plan: Tolerating statin, encouraged heart healthy diet, avoid  trans fats, minimize simple carbs and saturated fats. Increase exercise as tolerated  Orders: -     Lipid panel -     Comprehensive metabolic panel -     TSH -     Ambulatory referral to Cardiology  Osteoporosis, unspecified osteoporosis type, unspecified pathological fracture presence Assessment & Plan: Encouraged to get adequate exercise, calcium and vitamin d intake   Orders: -     VITAMIN D 25 Hydroxy (Vit-D Deficiency, Fractures)  Muscle cramps Assessment & Plan: Hydrate and monitor   Orders: -     Magnesium  Fall, initial encounter Assessment & Plan: Had a fall "around the fourth of July" but she has no memory of falling or injuring herself and she thinks she has forgotten roughly a whole day. She thinks maybe she fell in her bathroom and then got herself to her bed. She was confused for a good day afterward. Check blood work and urinalysis and culture. If no answers consider ct head and further work up.   Orders: -     US Carotid Bilateral; Future  Syncope, unspecified syncope type -     CBC with Differential/Platelet -     Urinalysis, Routine w reflex microscopic -     Urine Culture -     CT HEAD WO CONTRAST ( ); Future -     US Carotid Bilateral; Future -     Ambulatory referral to Cardiology  Transient alteration of awareness Assessment & Plan: Will order CT head and carotid dopplers  Orders: -     CT HEAD WO CONTRAST ( ); Future -     US Carotid Bilateral; Future  Weight loss Assessment & Plan: She has dropped her protein intake and lost weight, she will up her protein intake and monitor  Orders: -     CT HEAD WO CONTRAST ( ); Future    Assessment and Plan    Unexplained Fall with Memory Loss: Patient reports a fall with significant bruising and memory loss around the time of the event. No recollection of the fall. No associated headache, nausea, or vision changes. No urinary symptoms, constipation, chest pain, or sickness. Neurological exam  normal. -Order CT scan of the head and ultrasound of carotid arteries to rule out stroke or TIA. -Order blood work and urinalysis to rule out infection or hypoglycemia as cause of fall. -Consider referral to neurology if imaging and lab work do not reveal a cause.  Unintentional Weight Loss: Patient reports unintentional weight loss of 15 pounds. No changes in diet or exercise reported. Bowel movements normal. No nausea or vomiting. -Increase protein intake to maintain muscle mass and strength. -Monitor weight closely.  Cardiac Risk Factors: Patient has diabetes, a risk factor for cardiac disease. No classic cardiac symptoms reported but with probable recent syncope have ordered the carotid dopplers and request cardiology evaluation -Refer to cardiology for consultation.  General Health Maintenance: -Encourage increased water intake, especially in heat, to prevent dehydration. -Consider physical therapy if unsteadiness continues. -Schedule follow-up in 8-10 weeks.         Danise Edge, MD

## 2023-07-01 NOTE — Assessment & Plan Note (Signed)
Hydrate and monitor 

## 2023-07-01 NOTE — Assessment & Plan Note (Signed)
Encouraged to get adequate exercise, calcium and vitamin d intake 

## 2023-07-02 ENCOUNTER — Ambulatory Visit (INDEPENDENT_AMBULATORY_CARE_PROVIDER_SITE_OTHER): Payer: Medicare Other | Admitting: Family Medicine

## 2023-07-02 VITALS — BP 120/70 | HR 71 | Temp 98.0°F | Resp 16 | Ht 63.0 in | Wt 134.0 lb

## 2023-07-02 DIAGNOSIS — E782 Mixed hyperlipidemia: Secondary | ICD-10-CM

## 2023-07-02 DIAGNOSIS — Z7984 Long term (current) use of oral hypoglycemic drugs: Secondary | ICD-10-CM | POA: Diagnosis not present

## 2023-07-02 DIAGNOSIS — R634 Abnormal weight loss: Secondary | ICD-10-CM | POA: Diagnosis not present

## 2023-07-02 DIAGNOSIS — R55 Syncope and collapse: Secondary | ICD-10-CM

## 2023-07-02 DIAGNOSIS — R252 Cramp and spasm: Secondary | ICD-10-CM | POA: Diagnosis not present

## 2023-07-02 DIAGNOSIS — R404 Transient alteration of awareness: Secondary | ICD-10-CM | POA: Diagnosis not present

## 2023-07-02 DIAGNOSIS — W19XXXA Unspecified fall, initial encounter: Secondary | ICD-10-CM | POA: Insufficient documentation

## 2023-07-02 DIAGNOSIS — E119 Type 2 diabetes mellitus without complications: Secondary | ICD-10-CM | POA: Diagnosis not present

## 2023-07-02 DIAGNOSIS — M810 Age-related osteoporosis without current pathological fracture: Secondary | ICD-10-CM

## 2023-07-02 DIAGNOSIS — R413 Other amnesia: Secondary | ICD-10-CM | POA: Diagnosis not present

## 2023-07-02 HISTORY — DX: Transient alteration of awareness: R40.4

## 2023-07-02 HISTORY — DX: Unspecified fall, initial encounter: W19.XXXA

## 2023-07-02 NOTE — Assessment & Plan Note (Signed)
Will order CT head and carotid dopplers

## 2023-07-02 NOTE — Assessment & Plan Note (Signed)
She has dropped her protein intake and lost weight, she will up her protein intake and monitor

## 2023-07-02 NOTE — Patient Instructions (Addendum)
Drink 60-80 ounces of water throughout the day Increase protein intake  Rest and stay out the heat   Put your neighbor in your chart as someone we can talk to her if you want her to talk to [place]    It is important to avoid accidents which may result in broken bones.  Here are a few ideas on how to make your home safer so you will be less likely to trip or fall.  Use nonskid mats or non slip strips in your shower or tub, on your bathroom floor and around sinks.  If you know that you have spilled water, wipe it up! In the bathroom, it is important to have properly installed grab bars on the walls or on the edge of the tub.  Towel racks are NOT strong enough for you to hold onto or to pull on for support. Stairs and hallways should have enough light.  Add lamps or night lights if you need ore light. It is good to have handrails on both sides of the stairs if possible.  Always fix broken handrails right away. It is important to see the edges of steps.  Paint the edges of outdoor steps white so you can see them better.  Put colored tape on the edge of inside steps. Throw-rugs are dangerous because they can slide.  Removing the rugs is the best idea, but if they must stay, add adhesive carpet tape to prevent slipping. Do not keep things on stairs or in the halls.  Remove small furniture that blocks the halls as it may cause you to trip.  Keep telephone and electrical cords out of the way where you walk. Always were sturdy, rubber-soled shoes for good support.  Never wear just socks, especially on the stairs.  Socks may cause you to slip or fall.  Do not wear full-length housecoats as you can easily trip on the bottom.  Place the things you use the most on the shelves that are the easiest to reach.  If you use a stepstool, make sure it is in good condition.  If you feel unsteady, DO NOT climb, ask for help. If a health professional advises you to use a cane or walker, do not be ashamed.  These items can  keep you from falling and breaking your bones.

## 2023-07-02 NOTE — Assessment & Plan Note (Signed)
Had a fall "around the fourth of July" but she has no memory of falling or injuring herself and she thinks she has forgotten roughly a whole day. She thinks maybe she fell in her bathroom and then got herself to her bed. She was confused for a good day afterward. Check blood work and urinalysis and culture. If no answers consider ct head and further work up.

## 2023-07-03 ENCOUNTER — Telehealth (HOSPITAL_BASED_OUTPATIENT_CLINIC_OR_DEPARTMENT_OTHER): Payer: Self-pay

## 2023-07-03 LAB — URINALYSIS, ROUTINE W REFLEX MICROSCOPIC
Bilirubin Urine: NEGATIVE
Hgb urine dipstick: NEGATIVE
Ketones, ur: NEGATIVE
Leukocytes,Ua: NEGATIVE
Nitrite: NEGATIVE
RBC / HPF: NONE SEEN (ref 0–?)
Specific Gravity, Urine: 1.02 (ref 1.000–1.030)
Total Protein, Urine: NEGATIVE
Urine Glucose: NEGATIVE
Urobilinogen, UA: 1 (ref 0.0–1.0)
pH: 7.5 (ref 5.0–8.0)

## 2023-07-03 LAB — CBC WITH DIFFERENTIAL/PLATELET
Basophils Absolute: 0.1 10*3/uL (ref 0.0–0.1)
Basophils Relative: 0.9 % (ref 0.0–3.0)
Eosinophils Absolute: 0.3 10*3/uL (ref 0.0–0.7)
Eosinophils Relative: 3.4 % (ref 0.0–5.0)
HCT: 37.3 % (ref 36.0–46.0)
Hemoglobin: 12.3 g/dL (ref 12.0–15.0)
Lymphocytes Relative: 22.9 % (ref 12.0–46.0)
Lymphs Abs: 1.8 10*3/uL (ref 0.7–4.0)
MCHC: 32.9 g/dL (ref 30.0–36.0)
MCV: 89.9 fl (ref 78.0–100.0)
Monocytes Absolute: 0.6 10*3/uL (ref 0.1–1.0)
Monocytes Relative: 7.3 % (ref 3.0–12.0)
Neutro Abs: 5.2 10*3/uL (ref 1.4–7.7)
Neutrophils Relative %: 65.5 % (ref 43.0–77.0)
Platelets: 295 10*3/uL (ref 150.0–400.0)
RBC: 4.14 Mil/uL (ref 3.87–5.11)
RDW: 13.1 % (ref 11.5–15.5)
WBC: 8 10*3/uL (ref 4.0–10.5)

## 2023-07-03 LAB — COMPREHENSIVE METABOLIC PANEL
ALT: 17 U/L (ref 0–35)
AST: 23 U/L (ref 0–37)
Albumin: 4.1 g/dL (ref 3.5–5.2)
Alkaline Phosphatase: 50 U/L (ref 39–117)
BUN: 19 mg/dL (ref 6–23)
CO2: 29 mEq/L (ref 19–32)
Calcium: 9.9 mg/dL (ref 8.4–10.5)
Chloride: 103 mEq/L (ref 96–112)
Creatinine, Ser: 0.65 mg/dL (ref 0.40–1.20)
GFR: 82.24 mL/min (ref >60.00)
Glucose, Bld: 77 mg/dL (ref 70–99)
Potassium: 4.2 mEq/L (ref 3.5–5.1)
Sodium: 143 mEq/L (ref 135–145)
Total Bilirubin: 0.6 mg/dL (ref 0.2–1.2)
Total Protein: 6.7 g/dL (ref 6.0–8.3)

## 2023-07-03 LAB — HEMOGLOBIN A1C: Hgb A1c MFr Bld: 6.3 % (ref 4.6–6.5)

## 2023-07-03 LAB — LIPID PANEL
Cholesterol: 130 mg/dL (ref 0–200)
HDL: 52.1 mg/dL (ref >39.00)
LDL Cholesterol: 57 mg/dL (ref 0–99)
NonHDL: 77.85
Total CHOL/HDL Ratio: 2
Triglycerides: 105 mg/dL (ref 0.0–149.0)
VLDL: 21 mg/dL (ref 0.0–40.0)

## 2023-07-03 LAB — VITAMIN D 25 HYDROXY (VIT D DEFICIENCY, FRACTURES): VITD: 72.06 ng/mL (ref 30.00–100.00)

## 2023-07-03 LAB — TSH: TSH: 2.38 u[IU]/mL (ref 0.35–5.50)

## 2023-07-03 LAB — URINE CULTURE
MICRO NUMBER:: 15176675
Result:: NO GROWTH
SPECIMEN QUALITY:: ADEQUATE

## 2023-07-03 LAB — MAGNESIUM: Magnesium: 1.8 mg/dL (ref 1.5–2.5)

## 2023-07-09 ENCOUNTER — Telehealth (HOSPITAL_BASED_OUTPATIENT_CLINIC_OR_DEPARTMENT_OTHER): Payer: Self-pay

## 2023-07-13 ENCOUNTER — Ambulatory Visit (HOSPITAL_BASED_OUTPATIENT_CLINIC_OR_DEPARTMENT_OTHER): Payer: Medicare Other | Attending: Family Medicine

## 2023-07-13 ENCOUNTER — Ambulatory Visit (HOSPITAL_BASED_OUTPATIENT_CLINIC_OR_DEPARTMENT_OTHER): Admission: RE | Admit: 2023-07-13 | Payer: Medicare Other | Source: Ambulatory Visit

## 2023-07-13 ENCOUNTER — Encounter (HOSPITAL_BASED_OUTPATIENT_CLINIC_OR_DEPARTMENT_OTHER): Payer: Self-pay

## 2023-07-14 ENCOUNTER — Encounter: Payer: Self-pay | Admitting: Family Medicine

## 2023-07-16 ENCOUNTER — Telehealth (HOSPITAL_BASED_OUTPATIENT_CLINIC_OR_DEPARTMENT_OTHER): Payer: Self-pay

## 2023-07-16 NOTE — Telephone Encounter (Signed)
Spoke with pt this morning she stated no recent falls , last fall was 3 days ago ... Today she is up and moving around but feels like her mind is not right , stated she can't really explain what's going on but feels her mind isnt right and she has been" lost ". She stated she does have a neighbor who tries to help her out and she does believe she hit her head during her first fall on the 4th of July weekend.

## 2023-07-17 ENCOUNTER — Other Ambulatory Visit: Payer: Self-pay

## 2023-07-17 DIAGNOSIS — R350 Frequency of micturition: Secondary | ICD-10-CM

## 2023-07-17 NOTE — Telephone Encounter (Signed)
Called pt was advised stated understand UA and culture labs ordered. Pt stated she  has appt for CT head and Carotid dopplers. Pt scheduled for labs appt next week due not having a car.

## 2023-07-19 ENCOUNTER — Ambulatory Visit (HOSPITAL_BASED_OUTPATIENT_CLINIC_OR_DEPARTMENT_OTHER)
Admission: RE | Admit: 2023-07-19 | Discharge: 2023-07-19 | Disposition: A | Discharge status: Home / Self Care | Payer: Medicare Other | Source: Ambulatory Visit | Attending: Family Medicine | Admitting: Family Medicine

## 2023-07-19 ENCOUNTER — Ambulatory Visit (HOSPITAL_BASED_OUTPATIENT_CLINIC_OR_DEPARTMENT_OTHER): Admission: RE | Admit: 2023-07-19 | Payer: Medicare Other | Source: Ambulatory Visit

## 2023-07-19 DIAGNOSIS — R413 Other amnesia: Secondary | ICD-10-CM | POA: Diagnosis not present

## 2023-07-19 DIAGNOSIS — I6523 Occlusion and stenosis of bilateral carotid arteries: Secondary | ICD-10-CM | POA: Diagnosis not present

## 2023-07-19 DIAGNOSIS — R55 Syncope and collapse: Secondary | ICD-10-CM

## 2023-07-19 DIAGNOSIS — R404 Transient alteration of awareness: Secondary | ICD-10-CM

## 2023-07-19 DIAGNOSIS — E119 Type 2 diabetes mellitus without complications: Secondary | ICD-10-CM | POA: Diagnosis not present

## 2023-07-19 DIAGNOSIS — H538 Other visual disturbances: Secondary | ICD-10-CM | POA: Diagnosis not present

## 2023-07-19 DIAGNOSIS — R634 Abnormal weight loss: Secondary | ICD-10-CM | POA: Diagnosis not present

## 2023-07-19 DIAGNOSIS — W19XXXA Unspecified fall, initial encounter: Secondary | ICD-10-CM | POA: Diagnosis not present

## 2023-07-19 DIAGNOSIS — I6521 Occlusion and stenosis of right carotid artery: Secondary | ICD-10-CM | POA: Diagnosis not present

## 2023-07-23 ENCOUNTER — Telehealth: Payer: Self-pay | Admitting: Family Medicine

## 2023-07-23 ENCOUNTER — Other Ambulatory Visit (INDEPENDENT_AMBULATORY_CARE_PROVIDER_SITE_OTHER): Payer: Medicare Other

## 2023-07-23 DIAGNOSIS — R350 Frequency of micturition: Secondary | ICD-10-CM | POA: Diagnosis not present

## 2023-07-23 LAB — URINALYSIS
Bilirubin Urine: NEGATIVE
Hgb urine dipstick: NEGATIVE
Ketones, ur: NEGATIVE
Leukocytes,Ua: NEGATIVE
Nitrite: NEGATIVE
Specific Gravity, Urine: <=1.005 — AB (ref 1.000–1.030)
Total Protein, Urine: NEGATIVE
Urine Glucose: NEGATIVE
Urobilinogen, UA: 0.2 (ref 0.0–1.0)
pH: 6 (ref 5.0–8.0)

## 2023-07-23 NOTE — Telephone Encounter (Signed)
Pt called stating that she is still having issues with confusion and wants to have the MRI done. Pt does not have an active order at this time.

## 2023-07-24 ENCOUNTER — Other Ambulatory Visit: Payer: Self-pay | Admitting: Family Medicine

## 2023-07-24 ENCOUNTER — Encounter: Payer: Self-pay | Admitting: Physician Assistant

## 2023-07-24 DIAGNOSIS — W19XXXD Unspecified fall, subsequent encounter: Secondary | ICD-10-CM

## 2023-07-24 DIAGNOSIS — R4182 Altered mental status, unspecified: Secondary | ICD-10-CM

## 2023-07-24 DIAGNOSIS — E119 Type 2 diabetes mellitus without complications: Secondary | ICD-10-CM

## 2023-07-24 DIAGNOSIS — R410 Disorientation, unspecified: Secondary | ICD-10-CM

## 2023-07-24 DIAGNOSIS — G3184 Mild cognitive impairment, so stated: Secondary | ICD-10-CM

## 2023-07-25 NOTE — Telephone Encounter (Signed)
Pt was advised.

## 2023-08-01 ENCOUNTER — Other Ambulatory Visit: Payer: Self-pay

## 2023-08-01 ENCOUNTER — Encounter (HOSPITAL_BASED_OUTPATIENT_CLINIC_OR_DEPARTMENT_OTHER): Payer: Self-pay | Admitting: Emergency Medicine

## 2023-08-01 ENCOUNTER — Emergency Department (HOSPITAL_BASED_OUTPATIENT_CLINIC_OR_DEPARTMENT_OTHER): Payer: Medicare Other

## 2023-08-01 ENCOUNTER — Ambulatory Visit: Payer: Medicare Other

## 2023-08-01 ENCOUNTER — Inpatient Hospital Stay (HOSPITAL_BASED_OUTPATIENT_CLINIC_OR_DEPARTMENT_OTHER)
Admission: EM | Admit: 2023-08-01 | Discharge: 2023-08-04 | DRG: 639 | Disposition: A | Discharge status: Home Health | Payer: Medicare Other | Attending: Internal Medicine | Admitting: Internal Medicine

## 2023-08-01 ENCOUNTER — Ambulatory Visit: Payer: Medicare Other | Admitting: Physician Assistant

## 2023-08-01 DIAGNOSIS — Z7982 Long term (current) use of aspirin: Secondary | ICD-10-CM | POA: Diagnosis not present

## 2023-08-01 DIAGNOSIS — Z841 Family history of disorders of kidney and ureter: Secondary | ICD-10-CM

## 2023-08-01 DIAGNOSIS — E11649 Type 2 diabetes mellitus with hypoglycemia without coma: Secondary | ICD-10-CM | POA: Diagnosis not present

## 2023-08-01 DIAGNOSIS — W19XXXA Unspecified fall, initial encounter: Secondary | ICD-10-CM | POA: Diagnosis present

## 2023-08-01 DIAGNOSIS — I10 Essential (primary) hypertension: Secondary | ICD-10-CM | POA: Diagnosis not present

## 2023-08-01 DIAGNOSIS — Z87891 Personal history of nicotine dependence: Secondary | ICD-10-CM

## 2023-08-01 DIAGNOSIS — E876 Hypokalemia: Secondary | ICD-10-CM | POA: Diagnosis not present

## 2023-08-01 DIAGNOSIS — Y929 Unspecified place or not applicable: Secondary | ICD-10-CM

## 2023-08-01 DIAGNOSIS — S0101XA Laceration without foreign body of scalp, initial encounter: Secondary | ICD-10-CM | POA: Diagnosis not present

## 2023-08-01 DIAGNOSIS — R569 Unspecified convulsions: Secondary | ICD-10-CM | POA: Diagnosis not present

## 2023-08-01 DIAGNOSIS — S0003XA Contusion of scalp, initial encounter: Secondary | ICD-10-CM | POA: Diagnosis not present

## 2023-08-01 DIAGNOSIS — J984 Other disorders of lung: Secondary | ICD-10-CM | POA: Diagnosis not present

## 2023-08-01 DIAGNOSIS — R001 Bradycardia, unspecified: Secondary | ICD-10-CM | POA: Diagnosis present

## 2023-08-01 DIAGNOSIS — R410 Disorientation, unspecified: Secondary | ICD-10-CM | POA: Diagnosis not present

## 2023-08-01 DIAGNOSIS — F0390 Unspecified dementia without behavioral disturbance: Secondary | ICD-10-CM | POA: Diagnosis present

## 2023-08-01 DIAGNOSIS — Z79899 Other long term (current) drug therapy: Secondary | ICD-10-CM

## 2023-08-01 DIAGNOSIS — Z9841 Cataract extraction status, right eye: Secondary | ICD-10-CM | POA: Diagnosis not present

## 2023-08-01 DIAGNOSIS — Z9842 Cataract extraction status, left eye: Secondary | ICD-10-CM | POA: Diagnosis not present

## 2023-08-01 DIAGNOSIS — R55 Syncope and collapse: Secondary | ICD-10-CM | POA: Diagnosis not present

## 2023-08-01 DIAGNOSIS — K219 Gastro-esophageal reflux disease without esophagitis: Secondary | ICD-10-CM | POA: Diagnosis not present

## 2023-08-01 DIAGNOSIS — Z833 Family history of diabetes mellitus: Secondary | ICD-10-CM

## 2023-08-01 DIAGNOSIS — R404 Transient alteration of awareness: Secondary | ICD-10-CM | POA: Diagnosis not present

## 2023-08-01 DIAGNOSIS — R296 Repeated falls: Secondary | ICD-10-CM | POA: Diagnosis present

## 2023-08-01 DIAGNOSIS — Z820 Family history of epilepsy and other diseases of the nervous system: Secondary | ICD-10-CM

## 2023-08-01 DIAGNOSIS — Z7984 Long term (current) use of oral hypoglycemic drugs: Secondary | ICD-10-CM | POA: Diagnosis not present

## 2023-08-01 DIAGNOSIS — E782 Mixed hyperlipidemia: Secondary | ICD-10-CM | POA: Diagnosis present

## 2023-08-01 DIAGNOSIS — M25561 Pain in right knee: Secondary | ICD-10-CM

## 2023-08-01 DIAGNOSIS — R0989 Other specified symptoms and signs involving the circulatory and respiratory systems: Secondary | ICD-10-CM | POA: Diagnosis not present

## 2023-08-01 HISTORY — DX: Disorientation, unspecified: R41.0

## 2023-08-01 LAB — COMPREHENSIVE METABOLIC PANEL
ALT: 11 U/L (ref 0–44)
AST: 20 U/L (ref 15–41)
Albumin: 4 g/dL (ref 3.5–5.0)
Alkaline Phosphatase: 36 U/L — ABNORMAL LOW (ref 38–126)
Anion gap: 8 (ref 5–15)
BUN: 16 mg/dL (ref 8–23)
CO2: 28 mmol/L (ref 22–32)
Calcium: 10 mg/dL (ref 8.9–10.3)
Chloride: 106 mmol/L (ref 98–111)
Creatinine, Ser: 0.51 mg/dL (ref 0.44–1.00)
GFR, Estimated: >60 mL/min (ref >60)
Glucose, Bld: 127 mg/dL — ABNORMAL HIGH (ref 70–99)
Potassium: 3.6 mmol/L (ref 3.5–5.1)
Sodium: 142 mmol/L (ref 135–145)
Total Bilirubin: 0.4 mg/dL (ref 0.3–1.2)
Total Protein: 6.5 g/dL (ref 6.5–8.1)

## 2023-08-01 LAB — CBC WITH DIFFERENTIAL/PLATELET
Abs Immature Granulocytes: 0.04 10*3/uL (ref 0.00–0.07)
Basophils Absolute: 0 10*3/uL (ref 0.0–0.1)
Basophils Relative: 0 %
Eosinophils Absolute: 0.1 10*3/uL (ref 0.0–0.5)
Eosinophils Relative: 1 %
HCT: 37.2 % (ref 36.0–46.0)
Hemoglobin: 12.3 g/dL (ref 12.0–15.0)
Immature Granulocytes: 0 %
Lymphocytes Relative: 9 %
Lymphs Abs: 0.9 10*3/uL (ref 0.7–4.0)
MCH: 29.8 pg (ref 26.0–34.0)
MCHC: 33.1 g/dL (ref 30.0–36.0)
MCV: 90.1 fL (ref 80.0–100.0)
Monocytes Absolute: 0.6 10*3/uL (ref 0.1–1.0)
Monocytes Relative: 7 %
Neutro Abs: 7.6 10*3/uL (ref 1.7–7.7)
Neutrophils Relative %: 83 %
Platelets: 270 10*3/uL (ref 150–400)
RBC: 4.13 MIL/uL (ref 3.87–5.11)
RDW: 13.2 % (ref 11.5–15.5)
WBC: 9.2 10*3/uL (ref 4.0–10.5)
nRBC: 0 % (ref 0.0–0.2)

## 2023-08-01 LAB — TROPONIN I (HIGH SENSITIVITY): Troponin I (High Sensitivity): 4 ng/L (ref <18)

## 2023-08-01 MED ORDER — LORAZEPAM 2 MG/ML IJ SOLN
1.0000 mg | Freq: Once | INTRAMUSCULAR | Status: AC
  Administered 2023-08-01: 1 mg via INTRAVENOUS
  Filled 2023-08-01: qty 1

## 2023-08-01 NOTE — ED Triage Notes (Signed)
Pt arrives to ED with c/o fall. Pt notes she has fallen at least three times over the past month. Pt denies remembering any of her falls. Neighbor notes some confusion. Pt notes possible head injury.

## 2023-08-01 NOTE — ED Notes (Signed)
Pt becoming more and more agitated, yelling, and pulling out IV. Pt disoriented x4, not following commands. Dr. Wallace Cullens notified. Placing orders to help pt calm

## 2023-08-01 NOTE — Plan of Care (Signed)
On-call neurology note  Received call from Dr. Wallace Cullens at [facility]. Patient presenting with complaints of recurrent episodes of confusion and falls with no recollection of those events.  Has had multiple events.  Has had Dopplers of the carotids and CT of the head as well as had an MRI of the brain today that did not show any intracranial abnormality.  She is 82 years old, lives alone and has had these episodes without any clear explanation warranting further workup. I would recommend admitting her to hospital at [facility], and hooked up to LTM EEG to make sure that these are not epileptogenic events.  I would also recommend keeping her on telemetry to look for any cardiac arrhythmias.  Please call inpatient neurology team once patient arrives at [facility] for formal consultation.  -- Milon Dikes, MD Neurologist [facility] Pager: [phone]

## 2023-08-01 NOTE — ED Provider Notes (Signed)
  Provider Note MRN:  [id number]  Arrival date & time: 08/01/23    ED Course and Medical Decision Making  Assumed care from Dr Anitra Lauth at shift change.  See note from prior team for complete details, in brief:  82 yo female Lives alone Hx T2DM, HLD Here with recurrent falls over the past month a/w ams She was seen 7/9 by her PCP; carotid US was ordered, she was to follow up with neurology today but office was closed She has multiple unwitnessed episodes where she has had an apparent fall w/o recollection Friend/neighbor witnessed episode of this today and she was able to answer questions and seemed coherent but pt had no recollection  Intermittently confused/disoriented  Not a/w focal weakness or numbness Back to baseline currently Labs stable MRI/cxr stable   Plan per prior physician f/u MRI, d/w neuro  MRI reviewed, no CVA, stable imaging  Clinical Course as of 08/01/23 1654  Thu Aug 01, 2023  1639 Spoke w/ Dr Wilford Corner, recommend EEG, will admit to hospitalist [SG]    Clinical Course User Index [SG] Sloan Leiter, DO    Pt/friend agreeable to plan Will d/w hospitalist Plan for admission, EEG, neuro eval      Procedures  Final Clinical Impressions(s) / ED Diagnoses     ICD-10-CM   1. Transient alteration of awareness  R40.4       ED Discharge Orders     None       Discharge Instructions   None        Sloan Leiter, DO 08/01/23 1654

## 2023-08-01 NOTE — ED Provider Notes (Signed)
[facility]ERGENCY DE[facility]MENT AT [facility] Provider Note   CSN: [id number] Arrival date & time: 08/01/23  1307     History  Chief Complaint  Patient presents with   Gail Lester    CHALYCE Lester is a 82 y.o. female.  Patient is an 82 year old female with a history of hypercholesterolemia, diabetes, GERD who is presenting today with her neighbor due to episodes of confusion and falls she cannot remember.  Patient reports as far she knows the first episode was in July on the fourth.  She remembers noticing that she was bruised on her arms and down her back with a lump on her head.  Shortly after she followed up with her PCP and at that time they ordered studies to evaluate for evidence of stroke, infection and thyroid issues.  Patient did have a CT of her head 2 weeks ago and a Doppler ultrasound of her carotids.  Ultimately those scans looked okay and she was supposed to follow-up with neurology today but their office was closed.  Patient reports that she had another episode sometime after the fourth that was similar but she cannot remember it and then her neighbor who she is with today in the room states that they saw her last night and she seemed to be her normal self she went home that evening and she was planning on taking her to the neurologist in the morning but at midnight she walked over to her house and seemed confused and thought it was time to go to her appointment.  This morning when she tried to take her to her appointment she reports the patient seemed disoriented and confused and seemed a little off balance when trying to walk.  Patient also had noticed she had fallen again but she does not remember any of this.  She does not remember being in the car going to the neurologist office either.  However the neighbor reports that she did seem coherent and would respond to questions but when they got back to the how she could not recall who his house it was or why they were there.  On  exam currently patient does not remember any of this.  Right now patient states she feels her normal self and neighbor states that this is her baseline.  She denies unilateral numbness, weakness.  She does have some tenderness behind her right ear where she apparently fell and a bruise on her right arm.  She is not having a headache or vision changes.  She denies alcohol and states she does not take any mind altering medications.  She has had no changes in her medications in the last few years.  sHe does take an 81 mg aspirin but is on no other anticoagulation.  She denies any palpitations, feeling like she is skipping beats, chest pain, shortness of breath, abdominal pain, nausea or vomiting.  At her doctor's appointment she had noted that she had lost 15 pounds without trying but did report eating more vegetables.  The history is provided by the patient and a friend.  Fall       Home Medications Prior to Admission medications   Medication Sig Start Date End Date Taking? Authorizing Provider  ACCU-CHEK GUIDE test strip USE AS DIRECTED 01/18/23   Bradd Canary, MD  Accu-Chek Softclix Lancets lancets USE AS DIRECTED 12/20/22   Bradd Canary, MD  Apoaequorin (PREVAGEN PO) Take 1 tablet by mouth daily.    [provider]  aspirin  81 MG tablet Take 81 mg by mouth daily.    [provider]  Blood Glucose Monitoring Suppl (ACCU-CHEK AVIVA PLUS) w/Device KIT Use as directed 12/01/21   Bradd Canary, MD  CALCIUM PO Take 630 mg by mouth daily. Taking 2 tablets of calcium citrate 315 each - 630mg  total    [provider]  Cholecalciferol (VITAMIN D3) 50 MCG (2000 UT) TABS Take 1 tablet by mouth daily.    [provider]  Co-Enzyme Q10 100 MG CAPS Take 1 capsule by mouth daily.    [provider]  COVID-19 mRNA vaccine [phone] (COMIRNATY) syringe Inject into the muscle. 12/11/22   Judyann Munson, MD  glimepiride (AMARYL) 4 MG tablet TAKE 1 TABLET BY MOUTH  DAILY  WITH BREAKFAST 12/20/22   Bradd Canary, MD  glucosamine-chondroitin 500-400 MG tablet Take 1 tablet by mouth daily. 05/21/23   Bradd Canary, MD  Iron-Vitamin C (IRON 100/C PO) Take 1 tablet by mouth daily.    [provider]  [place]idence Lanius 500 MG CAPS Take 1 capsule by mouth daily.    [provider]  Lancets Misc. (ACCU-CHEK FASTCLIX LANCET) KIT Use to check sugar 4 times a day.  Dx code: E11.9 11/07/21   Bradd Canary, MD  losartan (COZAAR) 25 MG tablet Take 1 tablet (25 mg total) by mouth daily. 05/21/23   Bradd Canary, MD  LUTEIN PO Take 25 mg by mouth daily.    [provider]  metFORMIN (GLUCOPHAGE-XR) 500 MG 24 hr tablet Take 1 tablet (500 mg total) by mouth in the morning, at noon, and at bedtime. 05/29/23   Bradd Canary, MD  Multiple Vitamin (MULTIVITAMIN) tablet Take 1 tablet by mouth daily.     [provider]  rosuvastatin (CRESTOR) 20 MG tablet TAKE 1 TABLET BY MOUTH DAILY 09/03/22   O'Neal, Ronnald Ramp, MD  vitamin C (ASCORBIC ACID) 500 MG tablet Take 500 mg by mouth daily.    [provider]      Allergies    Patient has no known allergies.    Review of Systems   Review of Systems  Physical Exam Updated Vital Signs BP (!) 125/53   Pulse 85   Temp 98 F (36.7 C) (Oral)   Resp 20   SpO2 95%  Physical Exam Vitals and nursing note reviewed.  Constitutional:      General: She is not in acute distress.    Appearance: She is well-developed.  HENT:     Head: Normocephalic and atraumatic.   Eyes:     Pupils: Pupils are equal, round, and reactive to light.  Cardiovascular:     Rate and Rhythm: Normal rate and regular rhythm.     Heart sounds: Normal heart sounds. No murmur heard.    No friction rub.  Pulmonary:     Effort: Pulmonary effort is normal.     Breath sounds: Normal breath sounds. No wheezing or rales.  Abdominal:     General: Bowel sounds are normal. There is no distension.     Palpations: Abdomen  is soft.     Tenderness: There is no abdominal tenderness. There is no guarding or rebound.  Musculoskeletal:        General: No tenderness. Normal range of motion.     Cervical back: No tenderness.     Comments: No edema  Skin:    General: Skin is warm and dry.     Findings: No rash.  Neurological:     Mental Status: She is alert and oriented to person, place, and time.     Cranial Nerves: No cranial nerve deficit.     Sensory: No sensory deficit.     Motor: No weakness or pronator drift.     Coordination: Coordination normal. Finger-Nose-Finger Test and Heel to [facility] Test normal.     Gait: Gait normal.     Comments: Patient is alert and oriented x 3.  No notable confusion at this time  Psychiatric:        Behavior: Behavior normal.     ED Results / Procedures / Treatments   Labs (all labs ordered are listed, but only abnormal results are displayed) Labs Reviewed  CBC WITH DIFFERENTIAL/PLATELET  COMPREHENSIVE METABOLIC PANEL    EKG EKG Interpretation Date/Time:  Thursday August 01 2023 13:23:53 EDT Ventricular Rate:  74 PR Interval:  168 QRS Duration:  109 QT Interval:  404 QTC Calculation: 449 R Axis:   0  Text Interpretation: Sinus rhythm Low voltage, precordial leads Anteroseptal infarct, old No significant change since last tracing Confirmed by Gwyneth Sprout ([id number]) on 08/01/2023 1:48:48 PM  Radiology DG Chest Port 1 View  Result Date: 08/01/2023 CLINICAL DATA:  Confusion, falls EXAM: PORTABLE CHEST 1 VIEW COMPARISON:  06/05/2012 FINDINGS: Transverse diameter of heart is increased. There are no signs of pulmonary edema. Linear densities are seen in the lower lung fields. Low lung volumes. There is no pleural effusion or pneumothorax. IMPRESSION: There are no signs of pulmonary edema or focal pulmonary consolidation. Linear densities in the lower lung fields may suggest crowding of markings due to low lung volumes are subsegmental atelectasis or scarring.  Electronically Signed   By: Ernie Avena M.D.   On: 08/01/2023 14:18    Procedures Procedures    Medications Ordered in ED Medications - No data to display  ED Course/ Medical Decision Making/ A&P                                 Medical Decision Making Amount and/or Complexity of Data Reviewed Labs: ordered. Radiology: ordered.   Pt with multiple medical problems and comorbidities and presenting today with a complaint that caries a high risk for morbidity and mortality.  Here today due to episodes of confusion and falls that patient cannot remember.  This is now been 3 episodes since July 4.  Patient does have fresh superficial skin tear behind the right ear and some ecchymosis on her arm.  No obvious signs for fracture however unclear what is causing these events.  Patient cannot remember anything but based on her neighbors report she seems confused after these events occur.  Concern for possible seizure activity versus syncope.  Patient is having no symptoms at this time and has no findings concerning for stroke on exam.  She did see her doctor after the initial episode and had blood work which showed normal renal function, hemoglobin, normal TSH.  Patient denies any alcohol or drug use.  She did have a CT scan that was unremarkable and a carotid ultrasound that showed some mild plaque in the right bulb but no stenosis.  She was opposed to follow-up with neurology today but the office was closed and when she went to the office today she was having an episode of confusion per her neighbor but it is now resolved.  Will get an MRI, check basic labs and consult neurology.  Final Clinical Impression(s) / ED Diagnoses Final diagnoses:  None    Rx / DC Orders ED Discharge Orders     None         Gwyneth Sprout, MD 08/01/23 1516

## 2023-08-01 NOTE — ED Notes (Addendum)
Pt's friend would like to be notified when patient gets room assignment. [phone]

## 2023-08-02 ENCOUNTER — Inpatient Hospital Stay (HOSPITAL_COMMUNITY): Payer: Medicare Other

## 2023-08-02 DIAGNOSIS — R55 Syncope and collapse: Secondary | ICD-10-CM | POA: Diagnosis not present

## 2023-08-02 DIAGNOSIS — K219 Gastro-esophageal reflux disease without esophagitis: Secondary | ICD-10-CM | POA: Diagnosis present

## 2023-08-02 DIAGNOSIS — R410 Disorientation, unspecified: Secondary | ICD-10-CM

## 2023-08-02 DIAGNOSIS — R296 Repeated falls: Secondary | ICD-10-CM | POA: Diagnosis present

## 2023-08-02 DIAGNOSIS — R404 Transient alteration of awareness: Secondary | ICD-10-CM

## 2023-08-02 DIAGNOSIS — Z9842 Cataract extraction status, left eye: Secondary | ICD-10-CM | POA: Diagnosis not present

## 2023-08-02 DIAGNOSIS — Z79899 Other long term (current) drug therapy: Secondary | ICD-10-CM | POA: Diagnosis not present

## 2023-08-02 DIAGNOSIS — I10 Essential (primary) hypertension: Secondary | ICD-10-CM | POA: Diagnosis present

## 2023-08-02 DIAGNOSIS — Z87891 Personal history of nicotine dependence: Secondary | ICD-10-CM | POA: Diagnosis not present

## 2023-08-02 DIAGNOSIS — Z9841 Cataract extraction status, right eye: Secondary | ICD-10-CM | POA: Diagnosis not present

## 2023-08-02 DIAGNOSIS — E782 Mixed hyperlipidemia: Secondary | ICD-10-CM | POA: Diagnosis present

## 2023-08-02 DIAGNOSIS — F0390 Unspecified dementia without behavioral disturbance: Secondary | ICD-10-CM | POA: Diagnosis present

## 2023-08-02 DIAGNOSIS — S0101XA Laceration without foreign body of scalp, initial encounter: Secondary | ICD-10-CM | POA: Diagnosis present

## 2023-08-02 DIAGNOSIS — Y929 Unspecified place or not applicable: Secondary | ICD-10-CM | POA: Diagnosis not present

## 2023-08-02 DIAGNOSIS — Z7984 Long term (current) use of oral hypoglycemic drugs: Secondary | ICD-10-CM | POA: Diagnosis not present

## 2023-08-02 DIAGNOSIS — Z833 Family history of diabetes mellitus: Secondary | ICD-10-CM | POA: Diagnosis not present

## 2023-08-02 DIAGNOSIS — R569 Unspecified convulsions: Secondary | ICD-10-CM

## 2023-08-02 DIAGNOSIS — Z7982 Long term (current) use of aspirin: Secondary | ICD-10-CM | POA: Diagnosis not present

## 2023-08-02 DIAGNOSIS — E11649 Type 2 diabetes mellitus with hypoglycemia without coma: Secondary | ICD-10-CM | POA: Diagnosis present

## 2023-08-02 DIAGNOSIS — E876 Hypokalemia: Secondary | ICD-10-CM | POA: Diagnosis not present

## 2023-08-02 DIAGNOSIS — R001 Bradycardia, unspecified: Secondary | ICD-10-CM | POA: Diagnosis present

## 2023-08-02 DIAGNOSIS — Z841 Family history of disorders of kidney and ureter: Secondary | ICD-10-CM | POA: Diagnosis not present

## 2023-08-02 DIAGNOSIS — Z820 Family history of epilepsy and other diseases of the nervous system: Secondary | ICD-10-CM | POA: Diagnosis not present

## 2023-08-02 DIAGNOSIS — W19XXXA Unspecified fall, initial encounter: Secondary | ICD-10-CM | POA: Diagnosis present

## 2023-08-02 HISTORY — DX: Syncope and collapse: R55

## 2023-08-02 LAB — URINALYSIS, ROUTINE W REFLEX MICROSCOPIC
Bilirubin Urine: NEGATIVE
Glucose, UA: NEGATIVE mg/dL
Hgb urine dipstick: NEGATIVE
Ketones, ur: 5 mg/dL — AB
Leukocytes,Ua: NEGATIVE
Nitrite: NEGATIVE
Protein, ur: NEGATIVE mg/dL
Specific Gravity, Urine: 1.012 (ref 1.005–1.030)
pH: 5 (ref 5.0–8.0)

## 2023-08-02 LAB — GLUCOSE, CAPILLARY
Glucose-Capillary: 153 mg/dL — ABNORMAL HIGH (ref 70–99)
Glucose-Capillary: 136 mg/dL — ABNORMAL HIGH (ref 70–99)
Glucose-Capillary: 113 mg/dL — ABNORMAL HIGH (ref 70–99)
Glucose-Capillary: 103 mg/dL — ABNORMAL HIGH (ref 70–99)

## 2023-08-02 LAB — CK: Total CK: 704 U/L — ABNORMAL HIGH (ref 38–234)

## 2023-08-02 LAB — ECHOCARDIOGRAM COMPLETE
Area-P 1/2: 3.42 cm2
S' Lateral: 2.8 cm

## 2023-08-02 LAB — MAGNESIUM: Magnesium: 1.7 mg/dL (ref 1.7–2.4)

## 2023-08-02 MED ORDER — ONDANSETRON HCL 4 MG PO TABS: 4.0000 mg | ORAL_TABLET | Freq: Four times a day (QID) | ORAL | Status: DC | PRN

## 2023-08-02 MED ORDER — ONDANSETRON HCL 4 MG/2ML IJ SOLN: 4.0000 mg | Freq: Four times a day (QID) | INTRAMUSCULAR | Status: DC | PRN

## 2023-08-02 MED ORDER — ENSURE ENLIVE PO LIQD
237.0000 mL | Freq: Two times a day (BID) | ORAL | Status: DC
  Administered 2023-08-03 (×2): 237 mL via ORAL

## 2023-08-02 MED ORDER — INSULIN ASPART 100 UNIT/ML IJ SOLN
0.0000 [IU] | Freq: Three times a day (TID) | INTRAMUSCULAR | Status: DC
  Administered 2023-08-02: 1 [IU] via SUBCUTANEOUS
  Administered 2023-08-03: 2 [IU] via SUBCUTANEOUS
  Administered 2023-08-03: 1 [IU] via SUBCUTANEOUS

## 2023-08-02 MED ORDER — ACETAMINOPHEN 325 MG PO TABS: 650.0000 mg | ORAL_TABLET | Freq: Four times a day (QID) | ORAL | Status: DC | PRN

## 2023-08-02 MED ORDER — SODIUM CHLORIDE 0.9 % IV SOLN: INTRAVENOUS | Status: DC

## 2023-08-02 MED ORDER — ENOXAPARIN SODIUM 40 MG/0.4ML IJ SOSY
40.0000 mg | PREFILLED_SYRINGE | INTRAMUSCULAR | Status: DC
  Administered 2023-08-02 – 2023-08-03 (×2): 40 mg via SUBCUTANEOUS
  Filled 2023-08-02 (×2): qty 0.4

## 2023-08-02 MED ORDER — ACETAMINOPHEN 650 MG RE SUPP: 650.0000 mg | Freq: Four times a day (QID) | RECTAL | Status: DC | PRN

## 2023-08-02 NOTE — H&P (Signed)
History and Physical    Patient: Gail Lester:[id number] DOB: [birth year] DOA: 08/01/2023 DOS: the patient was seen and examined on 08/02/2023 PCP: Bradd Canary, MD  Patient coming from:  [facility]  - lives with alone. Ambulates independently   Chief Complaint: recurrent episodes of confusion and falls   HPI: Gail Lester is a 82 y.o. female with medical history significant of T2DM, HLD, GERD who presented to ED with complaints of recurrent episodes of confusion and falls with no recollection of these events. She is unsure what led her to the hospital. Her neighbor gives history.  She had a fall an unwitnessed fall on July 4th and they think she fell backwards and hit her head on the floor. Unsure if she lost consciousness, but did not go to ED. She does not recall anything.  She saw bruises about 1 day later. She Saw her PCP and a CT  head and US carotids were ordered. She had a second fall about 2 weeks ago and she states she didn't lose consciousness.  She felt like she was tripping and turned and sat down. Doesn't recall much of this either. She fell again yesterday morning and had a neurology appointment but office was closed due to weather.  She was confused again after this fall. Her neighbor states after these falls she is confused. When they got in the car she was confused and when they got to her house she was confused, didn't think it was her house so she took her to the ED. She states she was talking normal in Ed, but around 5pm became confused. No history of seizures. She did not bite her tongue and no urinary incontinence.   She denies any recent illness, fever/chills. Memory seems to have drastically been affected since July 4th. Poor PO intake. She is driving, doesn't cook. Her neighbor will take her meals. She is able to cook breakfast. She does her own finances. No family here.    She did have to put her dog down recently. Very stressful event.  She has lost about 20 pounds in last  few months. She has no desire to eat.   She does not smoke or drink alcohol.   ER Course:  vitals: afebrile, bp: 111/98, HR: 74, RR: 17, oxygen; 94% on RA Pertinent labs: none  CXR: no acute finding MRI brain: small right parietal scalp contusion. Otherwise normal brain MRI  In ED: neurology consulted. R/o seizures, TRH asked to admit    Review of Systems: As mentioned in the history of present illness. All other systems reviewed and are negative. 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   Epidermal cyst 01/21/2017   Fibrocystic breast 02/10/2013   Left h/o FN biopsy    Ganglion cyst 11/14/2014   Right 2nd finger   GERD (gastroesophageal reflux disease) 10/09/2020   Hemangioma 09/28/2014   History of cataracts    Previous surgery to remove bilaterally   History of chicken pox    as a child   History of colonic polyp 11/21/2017   History of measles    as a child   History of mumps    as a child   Hypercholesteremia    Hyperlipidemia, mixed 02/10/2013   Melanocytic nevi, unspecified 09/28/2014   Mild neurocognitive disorder, unclear etiology 11/21/2020   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 08/14/2015   Onychomycosis  02/24/2017   Osteopenia 05/23/2017   Seborrheic keratosis 09/28/2014   Type 2 diabetes mellitus 02/10/2013   Past Surgical History:  Procedure Laterality Date   APPENDECTOMY  1955   BREAST BIOPSY  1985-1990   benign   EYE SURGERY Right 04/2019   cataract removal   EYE SURGERY Left 03/2020   cataract removal   ORIF WRIST FRACTURE  12/09/2011   Procedure: OPEN REDUCTION INTERNAL FIXATION (ORIF) WRIST FRACTURE;  Surgeon: Sharma Covert;  Location: [facility] OR;  Service: Orthopedics;  Laterality: Left;   Social History:  reports that she quit smoking about 33 years ago. Her smoking use included cigarettes. She started smoking about 48 years ago. She has never used smokeless tobacco. She  reports that she does not drink alcohol and does not use drugs.  No Known Allergies  Family History  Problem Relation Age of Onset   Kidney disease Mother    Alcoholism Mother    Diabetes Father    Kidney disease Maternal Grandmother    Cancer Maternal Grandmother        bladder   Cancer Maternal Aunt    Alzheimer's disease Maternal Aunt    Scoliosis Son    Colon cancer Other        grandparent    Prior to Admission medications   Medication Sig Start Date End Date Taking? Authorizing Provider  Apoaequorin (PREVAGEN PO) Take 1 tablet by mouth daily.   Yes [provider]  aspirin 81 MG tablet Take 81 mg by mouth daily.   Yes [provider]  CALCIUM PO Take 630 mg by mouth daily. Taking 2 tablets of calcium citrate 315 each - 630mg  total   Yes [provider]  Cholecalciferol (VITAMIN D3) 50 MCG (2000 UT) TABS Take 1 tablet by mouth daily.   Yes [provider]  Co-Enzyme Q10 100 MG CAPS Take 1 capsule by mouth daily.   Yes [provider]  glimepiride (AMARYL) 4 MG tablet TAKE 1 TABLET BY MOUTH DAILY  WITH BREAKFAST 12/20/22  Yes Bradd Canary, MD  glucosamine-chondroitin 500-400 MG tablet Take 1 tablet by mouth daily. 05/21/23  Yes Bradd Canary, MD  Iron-Vitamin C (IRON 100/C PO) Take 1 tablet by mouth daily.   Yes [provider]  Boris Lown Oil 500 MG CAPS Take 1 capsule by mouth daily.   Yes [provider]  losartan (COZAAR) 25 MG tablet Take 1 tablet (25 mg total) by mouth daily. 05/21/23  Yes Bradd Canary, MD  LUTEIN PO Take 25 mg by mouth daily.   Yes [provider]  metFORMIN (GLUCOPHAGE-XR) 500 MG 24 hr tablet Take 1 tablet (500 mg total) by mouth in the morning, at noon, and at bedtime. 05/29/23  Yes Bradd Canary, MD  Multiple Vitamin (MULTIVITAMIN) tablet Take 1 tablet by mouth daily.    Yes [provider]  rosuvastatin (CRESTOR) 20 MG tablet TAKE 1 TABLET BY MOUTH DAILY 09/03/22  Yes  O'Neal, Ronnald Ramp, MD  vitamin C (ASCORBIC ACID) 500 MG tablet Take 500 mg by mouth daily.   Yes [provider]  ACCU-CHEK GUIDE test strip USE AS DIRECTED 01/18/23   Bradd Canary, MD  Accu-Chek Softclix Lancets lancets USE AS DIRECTED 12/20/22   Bradd Canary, MD  Blood Glucose Monitoring Suppl (ACCU-CHEK AVIVA PLUS) w/Device KIT Use as directed 12/01/21   Bradd Canary, MD  COVID-19 mRNA vaccine [phone] (COMIRNATY) syringe Inject into the muscle. 12/11/22  Judyann Munson, MD  Lancets Misc. (ACCU-CHEK FASTCLIX LANCET) KIT Use to check sugar 4 times a day.  Dx code: E11.9 11/07/21   Bradd Canary, MD    Physical Exam: Vitals:   08/02/23 0745 08/02/23 0815 08/02/23 0830 08/02/23 1146  BP: (!) 119/53 (!) 151/79 (!) 111/98 135/65  Pulse:    73  Resp: 15 17  19   Temp:    98.3 F (36.8 C)  TempSrc:    Oral  SpO2:    93%   General:  Appears calm and comfortable and is in NAD Eyes:  PERRL, EOMI, normal lids, iris ENT:  grossly normal hearing, lips & tongue, mmm; appropriate dentition Neck:  no LAD, masses or thyromegaly; no carotid bruits Cardiovascular:  RRR, no m/r/g. No LE edema.  Respiratory:   CTA bilaterally with no wheezes/rales/rhonchi.  Normal respiratory effort. Abdomen:  soft, NT, ND, NABS Back:   normal alignment, no CVAT Skin:  no rash or induration seen on limited exam Musculoskeletal:  grossly normal tone BUE/BLE, good ROM, no bony abnormality Lower extremity:  No LE edema.  Limited foot exam with no ulcerations.  2+ distal pulses. Psychiatric:  grossly normal mood and affect, speech fluent and appropriate, AOx3 Neurologic:  CN 2-12 grossly intact, moves all extremities in coordinated fashion, sensation intact. FTK intact bilaterally. DTR 2+.    Radiological Exams on Admission: Independently reviewed - see discussion in A/P where applicable  MR BRAIN WO CONTRAST  Result Date: 08/01/2023 CLINICAL DATA:  Altered mental status.  Fall. EXAM: MRI  HEAD WITHOUT CONTRAST TECHNIQUE: Multiplanar, multiecho pulse sequences of the brain and surrounding structures were obtained without intravenous contrast. COMPARISON:  Head CT 07/19/2023. FINDINGS: Brain: No acute infarct or hemorrhage. No mass or midline shift. No hydrocephalus or extra-axial collection. No abnormal susceptibility. Vascular: Normal flow voids. Skull and upper cervical spine: Normal marrow signal. Sinuses/Orbits: Unremarkable. Other: Small right parietal scalp contusion. IMPRESSION: Small right parietal scalp contusion.  Otherwise normal brain MRI. Electronically Signed   By: Orvan Falconer M.D.   On: 08/01/2023 15:46   DG Chest Port 1 View  Result Date: 08/01/2023 CLINICAL DATA:  Confusion, falls EXAM: PORTABLE CHEST 1 VIEW COMPARISON:  06/05/2012 FINDINGS: Transverse diameter of heart is increased. There are no signs of pulmonary edema. Linear densities are seen in the lower lung fields. Low lung volumes. There is no pleural effusion or pneumothorax. IMPRESSION: There are no signs of pulmonary edema or focal pulmonary consolidation. Linear densities in the lower lung fields may suggest crowding of markings due to low lung volumes are subsegmental atelectasis or scarring. Electronically Signed   By: Ernie Avena M.D.   On: 08/01/2023 14:18    EKG: Independently reviewed.  NSR with rate 74; nonspecific ST changes with no evidence of acute ischemia   Labs on Admission: I have personally reviewed the available labs and imaging studies at the time of the admission.  Pertinent labs:   None   Assessment and Plan: Principal Problem:   Confusion and disorientation Active Problems:   possible Syncope and collapse   Type 2 diabetes mellitus with hypoglycemia (HCC)   Hyperlipidemia, mixed    Assessment and Plan: * Confusion and disorientation 82 year old presenting to ED with fall and confusion and history of 3 falls since July 4th with no recollection of events, worsening  memory and confusion, poor PO intake and weight loss -admit to tele -MRI brain with no acute findings -concern for possible seizure-neurology consulted. I have  ordered EEG and labs -UA pending for infectious w/u, but no signs/symptoms of infectious cause -check magnesium, TSH, CK -check echo with unwitnessed falls and syncopal work up  -concern hypoglycemia could be also contributing. Would stop amaryl with weight loss and A1C of 6.3 -other differentials include post concussion syndrome, dementia, seizure -seizure precautions -fall risk precautions -PT eval since lives alone   possible Syncope and collapse Unwittnessed falls x 3. First fall unsure how long on ground, can't recall any events Tele  Check echo Check orthostatics MRI brain with no acute findings EEG/follow up with neuro recs  TED hose Hold ARB Gentle IVF ? Hypoglycemia. Bs of 20 with EMS on sulfonyurease with poor PO intake    Type 2 diabetes mellitus with hypoglycemia (HCC) Recent A1C of 6.3  Had hypoglycemic event in transit from [facility] with CBG to 20 ? Any hypoglycemic related issue to fall/confusion. Not eating much at all with weight loss  Would stop amaryl Holding metformin Given D50, stable CBG. Continue q1 hour x2 Diet started, given glucerna SSI and accuchecks QAC/HS  Hold ARB with soft bp  Hyperlipidemia, mixed Continue crestor 20mg  daily     Advance Care Planning:   Code Status: Full Code   Consults: neurology   DVT Prophylaxis: lovenox   Family Communication: neighbors at bedside   Severity of Illness: The appropriate patient status for this patient is INPATIENT. Inpatient status is judged to be reasonable and necessary in order to provide the required intensity of service to ensure the patient's safety. The patient's presenting symptoms, physical exam findings, and initial radiographic and laboratory data in the context of their chronic comorbidities is felt to place them at high risk for  further clinical deterioration. Furthermore, it is not anticipated that the patient will be medically stable for discharge from the hospital within 2 midnights of admission.   * I certify that at the point of admission it is my clinical judgment that the patient will require inpatient hospital care spanning beyond 2 midnights from the point of admission due to high intensity of service, high risk for further deterioration and high frequency of surveillance required.*  Author: Orland Mustard, MD 08/02/2023 11:57 AM  For on call review [email or web address].

## 2023-08-02 NOTE — Progress Notes (Signed)
PM maint complete. No break down seen.

## 2023-08-02 NOTE — Assessment & Plan Note (Addendum)
82 year old presenting to ED with fall and confusion and history of 3 falls since July 4th with no recollection of events, worsening memory and confusion, poor PO intake and weight loss -admit to tele -MRI brain with no acute findings -concern for possible seizure-neurology consulted. I have ordered EEG and labs -UA pending for infectious w/u, but no signs/symptoms of infectious cause -check magnesium, TSH, CK -check echo with unwitnessed falls and syncopal work up  -concern hypoglycemia could be also contributing. Would stop amaryl with weight loss and A1C of 6.3 -other differentials include post concussion syndrome, dementia, seizure -seizure precautions -fall risk precautions -PT eval since lives alone

## 2023-08-02 NOTE — Assessment & Plan Note (Signed)
Unwittnessed falls x 3. First fall unsure how long on ground, can't recall any events Tele  Check echo Check orthostatics MRI brain with no acute findings EEG/follow up with neuro recs  TED hose Hold ARB Gentle IVF ? Hypoglycemia. Bs of 20 with EMS on sulfonyurease with poor PO intake

## 2023-08-02 NOTE — TOC Progression Note (Signed)
Transition of Care [facility]) - Progression Note    Patient Details  Name: Gail Lester MRN: [id number] Date of Birth: 03-Sep-1941  Transition of [facility]) CM/SW Contact  Gordy Clement, RN Phone Number: 08/02/2023, 3:57 PM  Clinical Narrative:     Patient from home. Lives alone but neighbor assists .  Home Health PT recommended and will be provided buy [employer] . A rolling walker will be provided by [employer] and delivered to room.   TOC will continue to follow patient for any additional discharge needs    Family to transport at DC .            Expected Discharge Plan and Services                                               Social Determinants of Health (SDOH) Interventions SDOH Screenings   Food Insecurity: No Food Insecurity (08/02/2023)  Housing: Low Risk  (08/02/2023)  Transportation Needs: No Transportation Needs (08/02/2023)  Utilities: Not At Risk (08/02/2023)  Alcohol Screen: Low Risk  (06/25/2023)  Depression (PHQ2-9): Low Risk  (07/02/2023)  Financial Resource Strain: Low Risk  (06/25/2023)  Physical Activity: Sufficiently Active (06/25/2023)  Social Connections: Socially Isolated (06/25/2023)  Stress: Stress Concern Present (06/25/2023)  Tobacco Use: Medium Risk (08/01/2023)    Readmission Risk Interventions     No data to display

## 2023-08-02 NOTE — Assessment & Plan Note (Signed)
Continue crestor 20mg daily  

## 2023-08-02 NOTE — Plan of Care (Signed)
  Problem: Health Behavior/Discharge Planning: Goal: Ability to manage health-related needs will improve Outcome: Progressing   Problem: Clinical Measurements: Goal: Ability to maintain clinical measurements within normal limits will improve Outcome: Progressing Goal: Will remain free from infection Outcome: Progressing Goal: Diagnostic test results will improve Outcome: Progressing Goal: Respiratory complications will improve Outcome: Progressing Goal: Cardiovascular complication will be avoided Outcome: Progressing   Problem: Activity: Goal: Risk for activity intolerance will decrease Outcome: Progressing   Problem: Coping: Goal: Level of anxiety will decrease Outcome: Progressing

## 2023-08-02 NOTE — ED Notes (Signed)
Gail Lester at [facility] will send transport for Bed Ready at [facility] 5W RM# [id number](NS)

## 2023-08-02 NOTE — ED Notes (Signed)
Attempted to call report to 5W 

## 2023-08-02 NOTE — Consult Note (Signed)
NEURO HOSPITALIST CONSULT NOTE   Requestig physician: Dr. Artis Flock  Reason for Consult: Recurrent episodes of confusion and LOC  History obtained from:  Patient and Chart     HPI:                                                                                                                                          Gail Lester is an 82 y.o. female with an extensive PMHx as listed below, who initially presented to the [facility] ED on Thursday after a fall. She stated that she had fallen at least three times over the past month. She cannot recall how she falls, stating that essentially she has a black out then finds herself on the floor. A neighbor noted that she had been confused just prior to her presenting to [facility]. Her falls have not been witnessed and therefore it is unknown what causes the falls, but seizures are suspected given loss of memory for the events. She states that she has no history of seizures. She has been transferred to [facility] for LTM EEG.   History obtained by EDP at [facility] has been reviewed: "Patient reports as far she knows the first episode was in July on the fourth. She remembers noticing that she was bruised on her arms and down her back with a lump on her head. Shortly after she followed up with her PCP and at that time they ordered studies to evaluate for evidence of stroke, infection and thyroid issues. Patient did have a CT of her head 2 weeks ago and a Doppler ultrasound of her carotids. Ultimately those scans looked okay and she was supposed to follow-up with neurology today but their office was closed. Patient reports that she had another episode sometime after the fourth that was similar but she cannot remember it and then her neighbor who she is with today in the room states that they saw her last night and she seemed to be her normal self she went home that evening and she was planning on taking her to the neurologist in the morning but at midnight she walked  over to her house and seemed confused and thought it was time to go to her appointment. This morning when she tried to take her to her appointment she reports the patient seemed disoriented and confused and seemed a little off balance when trying to walk. Patient also had noticed she had fallen again but she does not remember any of this. She does not remember being in the car going to the neurologist office either. However the neighbor reports that she did seem coherent and would respond to questions but when they got back to the how she could not recall who his house it was or why they were there.  On exam currently patient does not remember any of this. Right now patient states she feels her normal self and neighbor states that this is her baseline. She denies unilateral numbness, weakness. She does have some tenderness behind her right ear where she apparently fell and a bruise on her right arm. She is not having a headache or vision changes. She denies alcohol and states she does not take any mind altering medications. She has had no changes in her medications in the last few years. sHe does take an 81 mg aspirin but is on no other anticoagulation. She denies any palpitations, feeling like she is skipping beats, chest pain, shortness of breath, abdominal pain, nausea or vomiting. At her doctor's appointment she had noted that she had lost 15 pounds without trying but did report eating more vegetables."  Spot EEG this afternoon was normal. She has been hooked up to LTM.   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   Epidermal cyst 01/21/2017   Fibrocystic breast 02/10/2013   Left h/o FN biopsy    Ganglion cyst 11/14/2014   Right 2nd finger   GERD (gastroesophageal reflux disease) 10/09/2020   Hemangioma 09/28/2014   History of cataracts    Previous surgery to remove bilaterally   History of chicken pox    as a  child   History of colonic polyp 11/21/2017   History of measles    as a child   History of mumps    as a child   Hypercholesteremia    Hyperlipidemia, mixed 02/10/2013   Melanocytic nevi, unspecified 09/28/2014   Mild neurocognitive disorder, unclear etiology 11/21/2020   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 08/14/2015   Onychomycosis 02/24/2017   Osteopenia 05/23/2017   Seborrheic keratosis 09/28/2014   Type 2 diabetes mellitus 02/10/2013    Past Surgical History:  Procedure Laterality Date   APPENDECTOMY  1955   BREAST BIOPSY  1985-1990   benign   EYE SURGERY Right 04/2019   cataract removal   EYE SURGERY Left 03/2020   cataract removal   ORIF WRIST FRACTURE  12/09/2011   Procedure: OPEN REDUCTION INTERNAL FIXATION (ORIF) WRIST FRACTURE;  Surgeon: Sharma Covert;  Location: [facility] OR;  Service: Orthopedics;  Laterality: Left;    Family History  Problem Relation Age of Onset   Kidney disease Mother    Alcoholism Mother    Diabetes Father    Kidney disease Maternal Grandmother    Cancer Maternal Grandmother        bladder   Cancer Maternal Aunt    Alzheimer's disease Maternal Aunt    Scoliosis Son    Colon cancer Other        grandparent             Social History:  reports that she quit smoking about 33 years ago. Her smoking use included cigarettes. She started smoking about 48 years ago. She has never used smokeless tobacco. She reports that she does not drink alcohol and does not use drugs.  No Known Allergies  MEDICATIONS:  Prior to Admission:  Medications Prior to Admission  Medication Sig Dispense Refill Last Dose   Apoaequorin (PREVAGEN PO) Take 1 tablet by mouth daily.   08/01/2023   aspirin 81 MG tablet Take 81 mg by mouth daily.   08/01/2023   CALCIUM PO Take 630 mg by mouth daily. Taking 2 tablets of calcium citrate 315 each - 630mg  total   08/01/2023    Cholecalciferol (VITAMIN D3) 50 MCG (2000 UT) TABS Take 1 tablet by mouth daily.   08/01/2023   Co-Enzyme Q10 100 MG CAPS Take 1 capsule by mouth daily.   08/01/2023   glimepiride (AMARYL) 4 MG tablet TAKE 1 TABLET BY MOUTH DAILY  WITH BREAKFAST 100 tablet 2 08/01/2023   glucosamine-chondroitin 500-400 MG tablet Take 1 tablet by mouth daily. 90 tablet 2 08/01/2023   Iron-Vitamin C (IRON 100/C PO) Take 1 tablet by mouth daily.   08/01/2023   Krill Oil 500 MG CAPS Take 1 capsule by mouth daily.   08/01/2023   losartan (COZAAR) 25 MG tablet Take 1 tablet (25 mg total) by mouth daily. 100 tablet 2 08/01/2023   LUTEIN PO Take 25 mg by mouth daily.   08/01/2023   metFORMIN (GLUCOPHAGE-XR) 500 MG 24 hr tablet Take 1 tablet (500 mg total) by mouth in the morning, at noon, and at bedtime. 270 tablet 2 08/01/2023   Multiple Vitamin (MULTIVITAMIN) tablet Take 1 tablet by mouth daily.    08/01/2023   rosuvastatin (CRESTOR) 20 MG tablet TAKE 1 TABLET BY MOUTH DAILY 90 tablet 3 08/01/2023   vitamin C (ASCORBIC ACID) 500 MG tablet Take 500 mg by mouth daily.   08/01/2023   ACCU-CHEK GUIDE test strip USE AS DIRECTED 300 strip 2    Accu-Chek Softclix Lancets lancets USE AS DIRECTED 300 each 2    Blood Glucose Monitoring Suppl (ACCU-CHEK AVIVA PLUS) w/Device KIT Use as directed 1 kit 0    Lancets Misc. (ACCU-CHEK FASTCLIX LANCET) KIT Use to check sugar 4 times a day.  Dx code: E11.9 1 kit 0    Scheduled:  enoxaparin (LOVENOX) injection  40 mg Subcutaneous Q24H   [START ON 08/03/2023] feeding supplement  237 mL Oral BID BM   insulin aspart  0-9 Units Subcutaneous TID WC   Continuous:  sodium chloride 75 mL/hr at 08/02/23 2239     ROS:                                                                                                                                       As per HPI.    Blood pressure 136/66, pulse 70, temperature 98.3 F (36.8 C), temperature source Oral, resp. rate 19, SpO2 92%.   General Examination:  Physical Exam  HEENT-  [place]/AT. EEG leads in place.     Lungs- Respirations unlabored Extremities- No edema  Neurological Examination Mental Status: Alert. Pleasant and cooperative. Oriented to city, state, year, month but not the day of the week. Thought content appropriate.  Speech is fluent with intact naming and comprehension. Able to follow all commands without difficulty. Cranial Nerves: II: Temporal visual fields intact with no extinction to DSS. PERRL  III,IV, VI: No ptosis. EOMI but with exotropia on upgaze as right eye deviates slightly outwards.  V: Temp sensation equal bilaterally  VII: Smile symmetric VIII: Hearing intact to voice IX,X: No hoarseness XI: Symmetric shoulder shrug XII: Midline tongue extension Motor: BUE 5/5 proximally and distally BLE 5/5 proximally and distally  No pronator drift.  Sensory: Temp and light touch intact throughout, bilaterally. No extinction to DSS.  Deep Tendon Reflexes: 2+ and symmetric throughout Cerebellar: No ataxia with FNF bilaterally  Gait: Deferred   Lab Results: Basic Metabolic Panel: Recent Labs  Lab 08/01/23 1356 08/02/23 1222  NA 142  --   K 3.6  --   CL 106  --   CO2 28  --   GLUCOSE 127*  --   BUN 16  --   CREATININE 0.51  --   CALCIUM 10.0  --   MG  --  1.7    CBC: Recent Labs  Lab 08/01/23 1356  WBC 9.2  NEUTROABS 7.6  HGB 12.3  HCT 37.2  MCV 90.1  PLT 270    Cardiac Enzymes: Recent Labs  Lab 08/02/23 1222  CKTOTAL 704*    Lipid Panel: No results for input(s): "CHOL", "TRIG", "HDL", "CHOLHDL", "VLDL", "LDLCALC" in the last 168 hours.  Imaging: ECHOCARDIOGRAM COMPLETE  Result Date: 08/02/2023    ECHOCARDIOGRAM REPORT   Patient Name:   YOSHIYE MOFFO Date of Exam: 08/02/2023 Medical Rec #:  [id number]     Height:       63.0 in Accession #:    [id number]    Weight:       134.0 lb Date of Birth:  06-15-1941      BSA:          1.631 m Patient Age:    82 years      BP:           135/65 mmHg Patient Gender: F             HR:           66 bpm. Exam Location:  Inpatient Procedure: 2D Echo Indications:    syncope  History:        Patient has prior history of Echocardiogram examinations, most                 recent 06/18/2022. Risk Factors:Diabetes and Dyslipidemia.  Sonographer:    Delcie Roch RDCS Referring Phys: [id number] ALLISON WOLFE IMPRESSIONS  1. Left ventricular ejection fraction, by estimation, is 55 to 60%. The left ventricle has normal function. The left ventricle has no regional wall motion abnormalities. Left ventricular diastolic parameters are consistent with Grade I diastolic dysfunction (impaired relaxation).  2. Right ventricular systolic function is normal. The right ventricular size is normal. There is normal pulmonary artery systolic pressure. The estimated right ventricular systolic pressure is 24.3 mmHg.  3. The mitral valve is grossly normal. No evidence of mitral valve regurgitation. No evidence of mitral stenosis.  4. The aortic valve is tricuspid. Aortic valve regurgitation is not visualized. No aortic stenosis  is present.  5. The inferior vena cava is normal in size with greater than 50% respiratory variability, suggesting right atrial pressure of 3 mmHg. Comparison(s): No significant change from prior study. FINDINGS  Left Ventricle: Left ventricular ejection fraction, by estimation, is 55 to 60%. The left ventricle has normal function. The left ventricle has no regional wall motion abnormalities. The left ventricular internal cavity size was normal in size. There is  no left ventricular hypertrophy. Left ventricular diastolic parameters are consistent with Grade I diastolic dysfunction (impaired relaxation). Right Ventricle: The right ventricular size is normal. No increase in right ventricular wall thickness. Right ventricular systolic function is normal. There is normal pulmonary artery  systolic pressure. The tricuspid regurgitant velocity is 2.31 m/s, and  with an assumed right atrial pressure of 3 mmHg, the estimated right ventricular systolic pressure is 24.3 mmHg. Left Atrium: Left atrial size was normal in size. Right Atrium: Right atrial size was normal in size. Pericardium: There is no evidence of pericardial effusion. Presence of epicardial fat layer. Mitral Valve: The mitral valve is grossly normal. No evidence of mitral valve regurgitation. No evidence of mitral valve stenosis. Tricuspid Valve: The tricuspid valve is grossly normal. Tricuspid valve regurgitation is mild . No evidence of tricuspid stenosis. Aortic Valve: The aortic valve is tricuspid. Aortic valve regurgitation is not visualized. No aortic stenosis is present. Pulmonic Valve: The pulmonic valve was grossly normal. Pulmonic valve regurgitation is not visualized. No evidence of pulmonic stenosis. Aorta: The aortic root and ascending aorta are structurally normal, with no evidence of dilitation. Venous: The inferior vena cava is normal in size with greater than 50% respiratory variability, suggesting right atrial pressure of 3 mmHg. IAS/Shunts: The atrial septum is grossly normal.  LEFT VENTRICLE PLAX 2D LVIDd:         4.60 cm   Diastology LVIDs:         2.80 cm   LV e' medial:    6.20 cm/s LV PW:         1.00 cm   LV E/e' medial:  9.6 LV IVS:        0.90 cm   LV e' lateral:   6.74 cm/s LVOT diam:     1.70 cm   LV E/e' lateral: 8.8 LV SV:         55 LV SV Index:   34 LVOT Area:     2.27 cm  RIGHT VENTRICLE             IVC RV Basal diam:  2.60 cm     IVC diam: 1.80 cm RV S prime:     11.60 cm/s TAPSE (M-mode): 2.4 cm LEFT ATRIUM             Index        RIGHT ATRIUM           Index LA diam:        3.10 cm 1.90 cm/m   RA Area:     12.40 cm LA Vol (A2C):   46.9 ml 28.76 ml/m  RA Volume:   27.90 ml  17.11 ml/m LA Vol (A4C):   35.5 ml 21.77 ml/m LA Biplane Vol: 40.5 ml 24.83 ml/m  AORTIC VALVE LVOT Vmax:   108.00 cm/s LVOT  Vmean:  70.400 cm/s LVOT VTI:    0.242 m  AORTA Ao Root diam: 2.80 cm Ao Asc diam:  2.90 cm MITRAL VALVE  TRICUSPID VALVE MV Area (PHT): 3.42 cm    TR Peak grad:   21.3 mmHg MV Decel Time: 222 msec    TR Vmax:        231.00 cm/s MV E velocity: 59.50 cm/s MV A velocity: 88.60 cm/s  SHUNTS MV E/A ratio:  0.67        Systemic VTI:  0.24 m                            Systemic Diam: 1.70 cm Lennie Odor MD Electronically signed by Lennie Odor MD Signature Date/Time: 08/02/2023/4:12:59 PM    Final    EEG adult  Result Date: 08/02/2023 Charlsie Quest, MD     08/02/2023  2:08 PM Patient Name: FARDOSA STAUTER MRN: [id number] Epilepsy Attending: Charlsie Quest Referring Physician/Provider: Orland Mustard, MD Date: 08/02/2023 Duration: 23.36 mins Patient history: 82yo F with recurrent episodes of confusion and falls with no recollection of those events. EEG to evaluate for seizure. Level of alertness: Awake, asleep AEDs during EEG study: None Technical aspects: This EEG study was done with scalp electrodes positioned according to the 10-20 International system of electrode placement. Electrical activity was reviewed with band pass filter of 1-70Hz , sensitivity of 7 uV/mm, display speed of 92mm/sec with a 60Hz  notched filter applied as appropriate. EEG data were recorded continuously and digitally stored.  Video monitoring was available and reviewed as appropriate. Description: The posterior dominant rhythm consists of 8-9 Hz activity of moderate voltage (25-35 uV) seen predominantly in posterior head regions, symmetric and reactive to eye opening and eye closing. Sleep was characterized by vertex waves, sleep spindles (12 to 14 Hz), maximal frontocentral region. Sharp transients were noted in left temporal region. Hyperventilation and photic stimulation were not performed.   IMPRESSION: This study is within normal limits. No seizures or epileptiform discharges were seen throughout the recording. A normal  interictal EEG does not exclude the diagnosis of epilepsy. Charlsie Quest   MR BRAIN WO CONTRAST  Result Date: 08/01/2023 CLINICAL DATA:  Altered mental status.  Fall. EXAM: MRI HEAD WITHOUT CONTRAST TECHNIQUE: Multiplanar, multiecho pulse sequences of the brain and surrounding structures were obtained without intravenous contrast. COMPARISON:  Head CT 07/19/2023. FINDINGS: Brain: No acute infarct or hemorrhage. No mass or midline shift. No hydrocephalus or extra-axial collection. No abnormal susceptibility. Vascular: Normal flow voids. Skull and upper cervical spine: Normal marrow signal. Sinuses/Orbits: Unremarkable. Other: Small right parietal scalp contusion. IMPRESSION: Small right parietal scalp contusion.  Otherwise normal brain MRI. Electronically Signed   By: Orvan Falconer M.D.   On: 08/01/2023 15:46   DG Chest Port 1 View  Result Date: 08/01/2023 CLINICAL DATA:  Confusion, falls EXAM: PORTABLE CHEST 1 VIEW COMPARISON:  06/05/2012 FINDINGS: Transverse diameter of heart is increased. There are no signs of pulmonary edema. Linear densities are seen in the lower lung fields. Low lung volumes. There is no pleural effusion or pneumothorax. IMPRESSION: There are no signs of pulmonary edema or focal pulmonary consolidation. Linear densities in the lower lung fields may suggest crowding of markings due to low lung volumes are subsegmental atelectasis or scarring. Electronically Signed   By: Ernie Avena M.D.   On: 08/01/2023 14:18     Assessment: 82 year old female with recurrent episodes of confusion as well as LOC with falls. She is amnestic for the events.  - Neurological exam is unremarkable.  - MRI brain: Small right parietal scalp contusion.  Otherwise normal brain MRI. - Spot EEG: Normal - TTE: No abnormalities suggestive of a strong predisposition to syncope, but will await final opinion from Hospitalist team.   Recommendations: - LTM EEG has been started to make sure that these  are not epileptic events.  - We would also recommend keeping her on telemetry to look for any cardiac arrhythmias.  - No AED indicated at this time.  - Orthostatics   Electronically signed: Dr. Caryl Pina 08/02/2023, 7:14 PM

## 2023-08-02 NOTE — Progress Notes (Signed)
   08/02/23 1553  TOC Brief Assessment  Insurance and Status Reviewed [facility] Medicare)  Patient has primary care physician Yes  Home environment has been reviewed Bradd Canary, MD  Prior level of function: Independent  Social Determinants of Health Reivew SDOH reviewed no interventions necessary  Readmission risk has been reviewed Yes (8%)  Transition of care needs transition of care needs identified, TOC will continue to follow   Patient lives alone but has a neighbor that looks out for her.  Recs Pending

## 2023-08-02 NOTE — Evaluation (Signed)
Physical Therapy Evaluation Patient Details Name: Gail Lester MRN: [id number] DOB: [birth year] Today's Date: 08/02/2023  History of Present Illness  Pt is an 82 y/o F admitted on 08/01/23 after presenting with c/o recurrent episodes of confusion & falls with no recollection of these events. Pt is being treated for confusion & disorientation. MRI was negative for acute findings. PMH: DM2, HLD, GERD, arthritis, measles, HLD, mild neurocognitive disorder of unclear etiology  Clinical Impression  Pt seen for PT evaluation with pt agreeable to tx. Pt is AxOx4, very pleasant. Pt reports she was independent without AD, driving, until July 4th when pt had a fall & has had 2 more since then. Pt has a very involved neighbor that helps her PRN. On this date, pt is able to complete bed mobility with reliance on bed rails, HOB slightly elevated. Pt attempts STS without AD but is unable, is able to transfer STS with RW & min assist, cuing for hand placement. Pt ambulates in hallway with RW & CGA<>min assist without overt LOB, pt noting it feels good to move. Will continue to follow pt acutely to address strengthening, balance, gait with LRAD & safety with mobility.  BP checked in RUE  BP  Supine 128/70 mmHg MAP 89  Sitting 137/80 mmHg MAP 98 - pt c/o lightheadedness  Standing at 0 141/81 mmHg MAP 99   Standing at 3 minutes 139/81 mmHg MAP 99 - pt reports symptoms have resolved  Sitting EOB after gait 146/67 mmHg MAP 90 - pt noted slight lightheadedness again           If plan is discharge home, recommend the following: A little help with walking and/or transfers;A little help with bathing/dressing/bathroom;Assistance with cooking/housework;Assist for transportation;Help with stairs or ramp for entrance;Direct supervision/assist for financial management   Can travel by private vehicle        Equipment Recommendations Rolling walker (2 wheels);BSC/3in1  Recommendations for Other Services        Functional Status Assessment Patient has had a recent decline in their functional status and demonstrates the ability to make significant improvements in function in a reasonable and predictable amount of time.     Precautions / Restrictions Precautions Precautions: Fall Restrictions Weight Bearing Restrictions: No      Mobility  Bed Mobility Overal bed mobility: Needs Assistance Bed Mobility: Supine to Sit, Sit to Supine     Supine to sit: Supervision, Used rails, HOB elevated (reliant on bed rails for supine>sit) Sit to supine: Supervision, HOB elevated        Transfers Overall transfer level: Needs assistance Equipment used: None, Rolling walker (2 wheels) Transfers: Sit to/from Stand Sit to Stand: Min assist           General transfer comment: Pt attempted STS from EOB without AD but unable, provided pt with RW & pt able to transfer STS from slightly elevated EOB with RW, cuing for hand placement and min assist.    Ambulation/Gait Ambulation/Gait assistance: Contact guard assist, Min assist Gait Distance (Feet): 150 Feet Assistive device: Rolling walker (2 wheels) Gait Pattern/deviations: Decreased step length - right, Decreased stride length, Step-through pattern, Decreased step length - left Gait velocity: slightly decreased     General Gait Details: Slight deviation to L with RW but pt able to correct without cuing  Stairs            Wheelchair Mobility     Tilt Bed    Modified Rankin (Stroke Patients Only)  Balance Overall balance assessment: Needs assistance, History of Falls Sitting-balance support: Feet supported Sitting balance-Leahy Scale: Fair Sitting balance - Comments: supervision static sitting   Standing balance support: During functional activity, Bilateral upper extremity supported, Reliant on assistive device for balance Standing balance-Leahy Scale: Fair                               Pertinent  Vitals/Pain Pain Assessment Pain Assessment: No/denies pain    Home Living Family/patient expects to be discharged to:: Private residence Living Arrangements: Alone Available Help at Discharge: Neighbor Type of Home: House (townhouse) Home Access: Level entry       Home Layout: One level Home Equipment: None      Prior Function Prior Level of Function : Independent/Modified Independent;Driving             Mobility Comments: 3 falls since July 4th, Pt is very active & continues walking daily, independent without AD       Extremity/Trunk Assessment   Upper Extremity Assessment Upper Extremity Assessment: Generalized weakness    Lower Extremity Assessment Lower Extremity Assessment: Generalized weakness       Communication   Communication Communication: No apparent difficulties  Cognition Arousal: Alert Behavior During Therapy: WFL for tasks assessed/performed Overall Cognitive Status: Within Functional Limits for tasks assessed                                          General Comments      Exercises     Assessment/Plan    PT Assessment Patient needs continued PT services  PT Problem List Decreased strength;Decreased activity tolerance;Decreased balance;Decreased mobility;Decreased knowledge of precautions;Decreased safety awareness;Decreased knowledge of use of DME       PT Treatment Interventions DME instruction;Balance training;Gait training;Neuromuscular re-education;Modalities;Stair training;Functional mobility training;Patient/family education;Therapeutic activities;Therapeutic exercise;Manual techniques    PT Goals (Current goals can be found in the Care Plan section)  Acute Rehab PT Goals Patient Stated Goal: return home with HHPT, get better, figure out what's going on PT Goal Formulation: With patient Time For Goal Achievement: 08/16/23 Potential to Achieve Goals: Good    Frequency Min 1X/week     Co-evaluation                AM-PAC PT "6 Clicks" Mobility  Outcome Measure Help needed turning from your back to your side while in a flat bed without using bedrails?: None Help needed moving from lying on your back to sitting on the side of a flat bed without using bedrails?: A Little Help needed moving to and from a bed to a chair (including a wheelchair)?: A Little Help needed standing up from a chair using your arms (e.g., wheelchair or bedside chair)?: A Little Help needed to walk in hospital room?: A Little Help needed climbing 3-5 steps with a railing? : A Little 6 Click Score: 19    End of Session Equipment Utilized During Treatment: Gait belt Activity Tolerance: Patient tolerated treatment well Patient left: in bed;with call bell/phone within reach;with bed alarm set Nurse Communication: Mobility status PT Visit Diagnosis: History of falling (Z91.81);Unsteadiness on feet (R26.81);Muscle weakness (generalized) (M62.81)    Time: 1610-9604 PT Time Calculation (min) (ACUTE ONLY): 20 min   Charges:   PT Evaluation $PT Eval Low Complexity: 1 Low   PT General Charges $$ ACUTE PT  VISIT: 1 Visit         Aleda Grana, PT, DPT 08/02/23, 2:58 PM   Sandi Mariscal 08/02/2023, 2:54 PM

## 2023-08-02 NOTE — Progress Notes (Signed)
  Echocardiogram 2D Echocardiogram has been performed.  Delcie Roch 08/02/2023, 3:23 PM

## 2023-08-02 NOTE — Progress Notes (Signed)
EEG complete - results pending 

## 2023-08-02 NOTE — Assessment & Plan Note (Addendum)
Recent A1C of 6.3  Had hypoglycemic event in transit from [facility] with CBG to 20 ? Any hypoglycemic related issue to fall/confusion. Not eating much at all with weight loss  Would stop amaryl Holding metformin Given D50, stable CBG. Continue q1 hour x2 Diet started, given glucerna SSI and accuchecks QAC/HS  Hold ARB with soft bp

## 2023-08-02 NOTE — Procedures (Signed)
Patient Name: Gail Lester  MRN: [id number]  Epilepsy Attending: Charlsie Quest  Referring Physician/Provider: Orland Mustard, MD  Date: 08/02/2023 Duration: 23.36 mins  Patient history: 82yo F with recurrent episodes of confusion and falls with no recollection of those events. EEG to evaluate for seizure.  Level of alertness: Awake, asleep  AEDs during EEG study: None  Technical aspects: This EEG study was done with scalp electrodes positioned according to the 10-20 International system of electrode placement. Electrical activity was reviewed with band pass filter of 1-70Hz , sensitivity of 7 uV/mm, display speed of 39mm/sec with a 60Hz  notched filter applied as appropriate. EEG data were recorded continuously and digitally stored.  Video monitoring was available and reviewed as appropriate.  Description: The posterior dominant rhythm consists of 8-9 Hz activity of moderate voltage (25-35 uV) seen predominantly in posterior head regions, symmetric and reactive to eye opening and eye closing. Sleep was characterized by vertex waves, sleep spindles (12 to 14 Hz), maximal frontocentral region. Sharp transients were noted in left temporal region. Hyperventilation and photic stimulation were not performed.     IMPRESSION: This study is within normal limits. No seizures or epileptiform discharges were seen throughout the recording.  A normal interictal EEG does not exclude the diagnosis of epilepsy.  Gail Lester

## 2023-08-02 NOTE — Progress Notes (Signed)
LTM EEG hooked up and running - no initial skin breakdown - push button tested - [facility] monitoring.  

## 2023-08-03 ENCOUNTER — Other Ambulatory Visit (HOSPITAL_COMMUNITY): Payer: Self-pay

## 2023-08-03 DIAGNOSIS — R410 Disorientation, unspecified: Secondary | ICD-10-CM | POA: Diagnosis not present

## 2023-08-03 DIAGNOSIS — R569 Unspecified convulsions: Secondary | ICD-10-CM | POA: Diagnosis not present

## 2023-08-03 LAB — SEDIMENTATION RATE: Sed Rate: 14 mm/hr (ref 0–22)

## 2023-08-03 LAB — FOLATE: Folate: 30 ng/mL (ref >5.9)

## 2023-08-03 LAB — GLUCOSE, CAPILLARY
Glucose-Capillary: 156 mg/dL — ABNORMAL HIGH (ref 70–99)
Glucose-Capillary: 137 mg/dL — ABNORMAL HIGH (ref 70–99)
Glucose-Capillary: 180 mg/dL — ABNORMAL HIGH (ref 70–99)
Glucose-Capillary: 137 mg/dL — ABNORMAL HIGH (ref 70–99)

## 2023-08-03 LAB — CORTISOL: Cortisol, Plasma: 8.8 ug/dL

## 2023-08-03 LAB — HIV ANTIBODY (ROUTINE TESTING W REFLEX): HIV Screen 4th Generation wRfx: NONREACTIVE

## 2023-08-03 LAB — TSH: TSH: 2.594 u[IU]/mL (ref 0.350–4.500)

## 2023-08-03 LAB — VITAMIN B12: Vitamin B-12: 425 pg/mL (ref 180–914)

## 2023-08-03 MED ORDER — POTASSIUM CHLORIDE CRYS ER 20 MEQ PO TBCR
40.0000 meq | EXTENDED_RELEASE_TABLET | Freq: Once | ORAL | Status: AC
  Administered 2023-08-03: 40 meq via ORAL
  Filled 2023-08-03: qty 2

## 2023-08-03 MED ORDER — LUTEIN 20 MG PO TABS: 25.0000 mg | ORAL_TABLET | Freq: Every day | ORAL | Status: DC

## 2023-08-03 MED ORDER — INSULIN LISPRO (1 UNIT DIAL) 100 UNIT/ML (KWIKPEN)
PEN_INJECTOR | SUBCUTANEOUS | 0 refills | Status: DC
  Filled 2023-08-03: qty 9, 30d supply, fill #0

## 2023-08-03 MED ORDER — BLOOD GLUCOSE MONITOR SYSTEM W/DEVICE KIT
1.0000 | PACK | Freq: Three times a day (TID) | 0 refills | Status: AC
  Filled 2023-08-03: qty 1, 30d supply, fill #0

## 2023-08-03 MED ORDER — ASPIRIN 81 MG PO TBEC
81.0000 mg | DELAYED_RELEASE_TABLET | Freq: Every day | ORAL | Status: DC
  Administered 2023-08-03 – 2023-08-04 (×2): 81 mg via ORAL
  Filled 2023-08-03 (×2): qty 1

## 2023-08-03 MED ORDER — LANCET DEVICE MISC
1.0000 | Freq: Three times a day (TID) | 0 refills | Status: AC
  Filled 2023-08-03: qty 1, 30d supply, fill #0

## 2023-08-03 MED ORDER — INSULIN PEN NEEDLE 32G X 4 MM MISC
0 refills | Status: DC
  Filled 2023-08-03: qty 100, 25d supply, fill #0

## 2023-08-03 MED ORDER — TRUEPLUS LANCETS 28G MISC
1.0000 | Freq: Three times a day (TID) | 0 refills | Status: DC
  Filled 2023-08-03: qty 100, 30d supply, fill #0

## 2023-08-03 MED ORDER — ROSUVASTATIN CALCIUM 20 MG PO TABS
20.0000 mg | ORAL_TABLET | Freq: Every day | ORAL | Status: DC
  Administered 2023-08-03: 20 mg via ORAL
  Filled 2023-08-03: qty 1

## 2023-08-03 MED ORDER — BLOOD GLUCOSE TEST VI STRP
1.0000 | ORAL_STRIP | Freq: Three times a day (TID) | 0 refills | Status: AC
  Filled 2023-08-03: qty 50, 17d supply, fill #0

## 2023-08-03 NOTE — Progress Notes (Signed)
Neurology Progress Note  Brief HPI: 82 y.o. female with medical history significant of T2DM, HLD, GERD who presented to ED with complaints of recurrent episodes of confusion and falls with no recollection of these events.  She states that over the last 6 months she has been having a change in her cognition and her appetite.  She states that she has lost about 25 pounds which is abnormal for her.  The only stressful event that she can recall is having to put her dog down however the changes that she noticed started prior to that.  She states that she feels more timid lately and more unsure of her decision making.   Of note she did have outpatient neurocognitive evaluation in December 2021 at which time she was noted to have isolated impairment across encoding and retrieval for verbal and visual memory of unclear etiology and repeat testing in 18 to 24 months or sooner if functional decline is noted was recommended due to diagnostic uncertainty  Subjective: Patient seen in room. LTM connected. States she is feeling well today, but has noticed that her cognition as been slower for the last 3-6 months  Exam: Vitals:   08/03/23 0400 08/03/23 0806  BP: (!) 114/58 (!) 144/76  Pulse:  75  Resp: 14 15  Temp:  98.2 F (36.8 C)  SpO2: 98%    Gen: In bed, NAD Resp: non-labored breathing, no acute distress Abd: soft, nt  Neuro: Mental Status: AAox4. Naming, repetition, comprehension intact. Names 14 animals that walk on 4 legs. Recalls 3/3. Adds change, subtracts by 7 appropriately. Clock drawing correct Speech is clear. Patient is able to give a clear and coherent history. No signs of aphasia or neglect Cranial Nerves: II: Visual Fields are full. PERRL.   III,IV, VI: EOMI without ptosis or diploplia.  V: Facial sensation is symmetric to temperature VII: Facial movement is symmetric resting and smiling VIII: Hearing is intact to voice X: Palate elevates symmetrically XI: Shoulder shrug is  symmetric. XII: Tongue protrudes midline without atrophy or fasciculations.  Motor: Tone is normal. Bulk is normal. 5/5 strength was present in all four extremities.  Sensory: Sensation is symmetric to light touch in the arms and legs. No extinction to DSS present.  Cerebellar: FNF and HKS are intact bilaterally, rapid alternating movements Gait: steady or deferred for safety    Pertinent Labs: B12 425 Folate 30 TSH - 2.894 RPR -pending HIV -negative Cortisol - 8.8 ESR 14  CK elevated at 704  Imaging Reviewed: MRI Brain 08/01/2023 Small right parietal scalp contusion. Otherwise normal brain MRI.   EEG 08/02/2023 1546 to 08/03/2023 0800  This study is within normal limits. No seizures or epileptiform discharges were seen throughout the recording.   Orthostatic vitals- pending   Assessment:   82 y.o. female with medical history significant of T2DM, HLD, GERD who presented to ED with complaints of recurrent episodes of confusion and falls with no recollection of these events.  She states that over the last 6 months she has been having a change in her cognition and her appetite.  She states that she has lost about 25 pounds which is abnormal for her.  The only stressful event that she can recall is having to put her dog down however the changes in her mental status started prior to that.   Her CK elevation is interesting, but does not explain her cognitive symptoms.  Will trend to see if this was a transient elevation in the setting of  having had a fall  Impression:  Seizure vs cognitive decline due to nutritional deficit in the setting of her weight loss  Recommendations: - Follow up with ambulatory neurology for further cognitive testing - Orthostatic vitals have not yet been completed, reordered for 8 AM tomorrow - B12, thiamine, folate, RPR, TSH, HIV, ESR to assess for reversible causes of cognitive dysfunction - Trend CK, check aldolase - Continue LTM  Patient seen and  examined by NP/APP with MD. MD to update note as needed.   Elmer Picker, DNP, FNP-BC [facility] Pager: [phone]  Attending Neurologist's note:  Patient resting comfortably at the time I stopped by her room and therefore full examination was deferred.  24 to 48 hours of LTM EEG monitoring to rule out underlying epilepsy is reasonable.  If this is negative further workup should be completed on an outpatient basis  Brooke Dare MD-PhD [facility] [phone]

## 2023-08-03 NOTE — Evaluation (Signed)
Speech Language Pathology Evaluation Patient Details Name: Gail Lester MRN: [id number] DOB: 1941-03-18 Today's Date: 08/03/2023 Time: 4696-2952 SLP Time Calculation (min) (ACUTE ONLY): 20 min  Problem List:  Patient Active Problem List   Diagnosis Date Noted   possible Syncope and collapse 08/02/2023   Confusion and disorientation 08/01/2023   Fall 07/02/2023   Weight loss 07/02/2023   Transient alteration of awareness 07/02/2023   Atypical chest pain 06/05/2022   Sacral pain 04/10/2021   Neck pain 04/10/2021   Otitis externa 04/10/2021   Mild neurocognitive disorder, unclear etiology 11/21/2020   Muscle cramps 04/24/2018   Osteoporosis 05/23/2017   Right knee pain 02/15/2017   Epidermal cyst 01/21/2017   Preventative health care 11/09/2016   Disorder of capillaries 01/16/2016   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 08/14/2015   Rotator cuff arthropathy 11/24/2014   Dysplastic nevus 10/11/2014   Seborrheic keratosis 09/28/2014   Melanocytic nevi, unspecified 09/28/2014   Hemangioma 09/28/2014   Overweight 06/14/2014   Type 2 diabetes mellitus with hypoglycemia (HCC) 02/10/2013   Hyperlipidemia, mixed 02/10/2013   Fibrocystic breast 02/10/2013   Dupuytren's contracture of both hands 02/10/2013   Past Medical History:  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   Epidermal cyst 01/21/2017   Fibrocystic breast 02/10/2013   Left h/o FN biopsy    Ganglion cyst 11/14/2014   Right 2nd finger   GERD (gastroesophageal reflux disease) 10/09/2020   Hemangioma 09/28/2014   History of cataracts    Previous surgery to remove bilaterally   History of chicken pox    as a child   History of colonic polyp 11/21/2017   History of measles    as a child   History of mumps    as a child   Hypercholesteremia    Hyperlipidemia, mixed 02/10/2013   Melanocytic nevi, unspecified 09/28/2014   Mild  neurocognitive disorder, unclear etiology 11/21/2020   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 08/14/2015   Onychomycosis 02/24/2017   Osteopenia 05/23/2017   Seborrheic keratosis 09/28/2014   Type 2 diabetes mellitus 02/10/2013   Past Surgical History:  Past Surgical History:  Procedure Laterality Date   APPENDECTOMY  1955   BREAST BIOPSY  1985-1990   benign   EYE SURGERY Right 04/2019   cataract removal   EYE SURGERY Left 03/2020   cataract removal   ORIF WRIST FRACTURE  12/09/2011   Procedure: OPEN REDUCTION INTERNAL FIXATION (ORIF) WRIST FRACTURE;  Surgeon: Sharma Covert;  Location: [facility] OR;  Service: Orthopedics;  Laterality: Left;   HPI:  Patient is an 82 y.o. female with PMH: DM-2, GERD, HLD. She was assessed by OP Neurology in 2021 for subjective cognitive decline and met criteria for Mild Neurocognitive Disorder with memory being the primary area of impairment.  She presented to the hospital on 08/01/2023 with c/o recurrent episodes of confusion with falls and no recollection of these events. She was brought to the hospital by her neighbor who provided some history: patient had an unwitnessed fall 06/27/23, went to her PCP and had CT head; she had a second fall two weeks prior to this admission without LOC; she fell again day prior to admission; neighbor reported that after falls, patient becomes confused. In ER, CXR did not show any acute finding; MRI brain showed Small right parietal scalp contusion but otherwise normal MRI.   Assessment / Plan / Recommendation Clinical Impression  Patient presents with cognitive impairment, however difficult to determine if she has had any decline from her baseline (diagnosed 2021 by OP Neurology) 'Mild Neurocognitive Disorder'. Patient reported that she has a close relationship with her neighbors but aside from that, she lives alone and does not do much socializing. She had a dog which unfortunately had to be euthinized a couple weeks ago from stomach cancer.  Patient participated in the SLUMS examination and received a score of 17 out of 30, placing her in the scoring category for 'Dementia'. (SLUMS does not diagnose dementia but indicates further testing warranted) She only recalled one of the five words after two minutes and recalled two of four delayed recall story questions and this is consistent with Neurology findings from testing in 2021. Surprisingly, she also had errors on clock drawing task with errors being on left side of clock and including writing a number twice, and writing numbers out of sequence. She demonstrated awaress to the fact that she had not performed correctly but was not able to identify specific errors or how to correct them. SLP is recommending continued skilled treatment while at the hospital as well as skilled SLP services at next venue of care.    SLP Assessment  SLP Recommendation/Assessment: Patient needs continued Speech Lanaguage Pathology Services SLP Visit Diagnosis: Cognitive communication deficit (R41.841)    Recommendations for follow up therapy are one component of a multi-disciplinary discharge planning process, led by the attending physician.  Recommendations may be updated based on patient status, additional functional criteria and insurance authorization.    Follow Up Recommendations  Other (comment) (OP SLP preferred, otherwise HH)    Assistance Recommended at Discharge  Intermittent Supervision/Assistance  Functional Status Assessment Patient has had a recent decline in their functional status and demonstrates the ability to make significant improvements in function in a reasonable and predictable amount of time.  Frequency and Duration min 1 x/week  1 week      SLP Evaluation Cognition  Overall Cognitive Status: Difficult to assess Arousal/Alertness: Awake/alert Orientation Level: Oriented X4 Attention: Sustained Sustained Attention: Appears intact Memory: Impaired Memory Impairment: Retrieval  deficit;Decreased recall of new information Awareness: Appears intact Problem Solving: Impaired Problem Solving Impairment: Functional complex Executive Function: Self Correcting Self Correcting: Impaired Self Correcting Impairment: Functional complex Safety/Judgment: Appears intact       Comprehension  Auditory Comprehension Overall Auditory Comprehension: Appears within functional limits for tasks assessed    Expression Expression Primary Mode of Expression: Verbal Verbal Expression Overall Verbal Expression: Appears within functional limits for tasks assessed   Oral / Motor  Oral Motor/Sensory Function Overall Oral Motor/Sensory Function: Within functional limits Motor Speech Overall Motor Speech: Appears within functional limits for tasks assessed           Angela Nevin, MA, CCC-SLP Speech Therapy

## 2023-08-03 NOTE — Progress Notes (Addendum)
PROGRESS NOTE                                                                                                                                                                                                             Patient Demographics:    Gail Lester, is a 82 y.o. female, DOB - [birth year], EXB:[id number]  Outpatient Primary MD for the patient is Bradd Canary, MD    LOS - 1  Admit date - 08/01/2023    Chief Complaint  Patient presents with   Fall       Brief Narrative (HPI from H&P)   82 y.o. female with medical history significant of T2DM, HLD, GERD who presented to ED with complaints of recurrent episodes of confusion and falls with no recollection of these events.  No seizure-like activity, tongue bite or incontinence.  Sugars were low after this latest event.   Subjective:    Gail Lester today has, No headache, No chest pain, No abdominal pain - No Nausea, No new weakness tingling or numbness, no SOB   Assessment  & Plan :    Episodes of hypoglycemia causing confusion and disorientation - ?  Suspicion for seizures, seen by neurology, thorough neurological workup including overnight EEG, MRI brain unremarkable, no headache or focal deficits, CBGs during the last episode were noted to be extremely low, she does take high-dose Amaryl and A1c is 6.3 I think hypoglycemia is causing her neurological events.  Offending medication has been discontinued, advance activity PT, OT and speech evaluation.  Monitor sugars and mentation today if stable likely discharge tomorrow on sliding scale insulin.    Possible Syncope and collapse   Kindly see above.  Hyperlipidemia, mixed  Continue crestor 20mg  daily   Hypokalemia.  Replaced.  Type 2 diabetes mellitus with hypoglycemia (HCC)   See #1 above on sliding scale insulin will be discharged on insulin sliding scale, testing supplies will be provided.  Insulin and diabetic  education today.  Able TSH and random cortisol.  Lab Results  Component Value Date   HGBA1C 6.3 07/02/2023   CBG (last 3)  Recent Labs    08/02/23 2159 08/03/23 0806 08/03/23 1208  GLUCAP 103* 156* 137*   Lab Results  Component Value Date   TSH 2.594 08/03/2023          Condition -  Fair  Family Communication  :  None  Code Status :  Full  Consults  :  Neuro  PUD Prophylaxis :    Procedures  :     Echoardiogram, MRI brain, overnight EEG.  All nonacute      Disposition Plan  :    Status is: Inpatient   DVT Prophylaxis  :    enoxaparin (LOVENOX) injection 40 mg Start: 08/02/23 1245 Place TED hose Start: 08/02/23 1147    Lab Results  Component Value Date   PLT 238 08/03/2023    Diet :  Diet Order             Diet Carb Modified Fluid consistency: Thin; Room service appropriate? Yes  Diet effective now                    Inpatient Medications  Scheduled Meds:  enoxaparin (LOVENOX) injection  40 mg Subcutaneous Q24H   feeding supplement  237 mL Oral BID BM   insulin aspart  0-9 Units Subcutaneous TID WC   Continuous Infusions: PRN Meds:.acetaminophen **OR** acetaminophen, ondansetron **OR** ondansetron (ZOFRAN) IV  Antibiotics  :    Anti-infectives (From admission, onward)    None         Objective:   Vitals:   08/02/23 2000 08/03/23 0000 08/03/23 0400 08/03/23 0806  BP: 130/71 (!) 109/54 (!) 114/58 (!) 144/76  Pulse: 67 60  75  Resp: 19 16 14 15   Temp: 98 F (36.7 C) 98.3 F (36.8 C)  98.2 F (36.8 C)  TempSrc: Oral Oral  Oral  SpO2: 93% 94% 98%     Wt Readings from Last 3 Encounters:  07/02/23 60.8 kg  12/11/22 69.5 kg  10/31/22 69.4 kg     Intake/Output Summary (Last 24 hours) at 08/03/2023 1208 Last data filed at 08/03/2023 0400 Gross per 24 hour  Intake 1009.19 ml  Output --  Net 1009.19 ml     Physical Exam  Awake Alert, No new F.N deficits, Normal affect [place].AT,PERRAL Supple Neck, No JVD,    Symmetrical Chest wall movement, Good air movement bilaterally, CTAB RRR,No Gallops,Rubs or new Murmurs,  +ve B.Sounds, Abd Soft, No tenderness,   No Cyanosis, Clubbing or edema      Data Review:    Recent Labs  Lab 08/01/23 1356 08/03/23 0303  WBC 9.2 6.1  HGB 12.3 11.4*  HCT 37.2 36.5  PLT 270 238  MCV 90.1 91.9  MCH 29.8 28.7  MCHC 33.1 31.2  RDW 13.2 13.2  LYMPHSABS 0.9  --   MONOABS 0.6  --   EOSABS 0.1  --   BASOSABS 0.0  --     Recent Labs  Lab 08/01/23 1356 08/02/23 1222 08/03/23 0303  NA 142  --  139  K 3.6  --  3.4*  CL 106  --  108  CO2 28  --  22  ANIONGAP 8  --  9  GLUCOSE 127*  --  96  BUN 16  --  9  CREATININE 0.51  --  0.63  AST 20  --   --   ALT 11  --   --   ALKPHOS 36*  --   --   BILITOT 0.4  --   --   ALBUMIN 4.0  --   --   TSH  --   --  2.594  MG  --  1.7  --   CALCIUM 10.0  --  8.6*  Recent Labs  Lab 08/01/23 1356 08/02/23 1222 08/03/23 0303  TSH  --   --  2.594  MG  --  1.7  --   CALCIUM 10.0  --  8.6*    Recent Labs  Lab 08/01/23 1356 08/03/23 0303  WBC 9.2 6.1  PLT 270 238  CREATININE 0.51 0.63    ------------------------------------------------------------------------------------------------------------------ Lab Results  Component Value Date   CHOL 130 07/02/2023   HDL 52.10 07/02/2023   LDLCALC 57 07/02/2023   LDLDIRECT 83.0 04/10/2021   TRIG 105.0 07/02/2023   CHOLHDL 2 07/02/2023    Lab Results  Component Value Date   HGBA1C 6.3 07/02/2023    Recent Labs    08/03/23 0303  TSH 2.594   ------------------------------------------------------------------------------------------------------------------ Cardiac Enzymes No results for input(s): "CKMB", "TROPONINI", "MYOGLOBIN" in the last 168 hours.  Invalid input(s): "CK"  Micro Results No results found for this or any previous visit (from the past 240 hour(s)).  Radiology Reports Overnight EEG with video  Result Date: 08/03/2023 Charlsie Quest, MD     08/03/2023  8:28 AM Patient Name: TAMIKE SEGGERMAN MRN: [id number] Epilepsy Attending: Charlsie Quest Referring Physician/Provider: Harolyn Rutherford, MD Duration: 08/02/2023 1546 to 08/03/2023 0800  Patient history: 82yo F with recurrent episodes of confusion and falls with no recollection of those events. EEG to evaluate for seizure.  Level of alertness: Awake, asleep  AEDs during EEG study: None  Technical aspects: This EEG study was done with scalp electrodes positioned according to the 10-20 International system of electrode placement. Electrical activity was reviewed with band pass filter of 1-70Hz , sensitivity of 7 uV/mm, display speed of 81mm/sec with a 60Hz  notched filter applied as appropriate. EEG data were recorded continuously and digitally stored.  Video monitoring was available and reviewed as appropriate.  Description: The posterior dominant rhythm consists of 8-9 Hz activity of moderate voltage (25-35 uV) seen predominantly in posterior head regions, symmetric and reactive to eye opening and eye closing. Sleep was characterized by vertex waves, sleep spindles (12 to 14 Hz), maximal frontocentral region. Sharp transients were noted in left temporal region. Hyperventilation and photic stimulation were not performed.   Of note, parts of the study were difficult to interpret due to significant electrode artifact.  IMPRESSION: This study is within normal limits. No seizures or epileptiform discharges were seen throughout the recording.  A normal interictal EEG does not exclude the diagnosis of epilepsy.  Charlsie Quest   ECHOCARDIOGRAM COMPLETE  Result Date: 08/02/2023    ECHOCARDIOGRAM REPORT   Patient Name:   LYNSEY SCHIESSER Date of Exam: 08/02/2023 Medical Rec #:  [id number]     Height:       63.0 in Accession #:    [id number]    Weight:       134.0 lb Date of Birth:  [birth year]     BSA:          1.631 m Patient Age:    82 years      BP:           135/65 mmHg Patient Gender: F              HR:           66 bpm. Exam Location:  Inpatient Procedure: 2D Echo Indications:    syncope  History:        Patient has prior history of Echocardiogram examinations, most  recent 06/18/2022. Risk Factors:Diabetes and Dyslipidemia.  Sonographer:    Delcie Roch RDCS Referring Phys: [id number] ALLISON WOLFE IMPRESSIONS  1. Left ventricular ejection fraction, by estimation, is 55 to 60%. The left ventricle has normal function. The left ventricle has no regional wall motion abnormalities. Left ventricular diastolic parameters are consistent with Grade I diastolic dysfunction (impaired relaxation).  2. Right ventricular systolic function is normal. The right ventricular size is normal. There is normal pulmonary artery systolic pressure. The estimated right ventricular systolic pressure is 24.3 mmHg.  3. The mitral valve is grossly normal. No evidence of mitral valve regurgitation. No evidence of mitral stenosis.  4. The aortic valve is tricuspid. Aortic valve regurgitation is not visualized. No aortic stenosis is present.  5. The inferior vena cava is normal in size with greater than 50% respiratory variability, suggesting right atrial pressure of 3 mmHg. Comparison(s): No significant change from prior study. FINDINGS  Left Ventricle: Left ventricular ejection fraction, by estimation, is 55 to 60%. The left ventricle has normal function. The left ventricle has no regional wall motion abnormalities. The left ventricular internal cavity size was normal in size. There is  no left ventricular hypertrophy. Left ventricular diastolic parameters are consistent with Grade I diastolic dysfunction (impaired relaxation). Right Ventricle: The right ventricular size is normal. No increase in right ventricular wall thickness. Right ventricular systolic function is normal. There is normal pulmonary artery systolic pressure. The tricuspid regurgitant velocity is 2.31 m/s, and  with an assumed right atrial pressure of 3  mmHg, the estimated right ventricular systolic pressure is 24.3 mmHg. Left Atrium: Left atrial size was normal in size. Right Atrium: Right atrial size was normal in size. Pericardium: There is no evidence of pericardial effusion. Presence of epicardial fat layer. Mitral Valve: The mitral valve is grossly normal. No evidence of mitral valve regurgitation. No evidence of mitral valve stenosis. Tricuspid Valve: The tricuspid valve is grossly normal. Tricuspid valve regurgitation is mild . No evidence of tricuspid stenosis. Aortic Valve: The aortic valve is tricuspid. Aortic valve regurgitation is not visualized. No aortic stenosis is present. Pulmonic Valve: The pulmonic valve was grossly normal. Pulmonic valve regurgitation is not visualized. No evidence of pulmonic stenosis. Aorta: The aortic root and ascending aorta are structurally normal, with no evidence of dilitation. Venous: The inferior vena cava is normal in size with greater than 50% respiratory variability, suggesting right atrial pressure of 3 mmHg. IAS/Shunts: The atrial septum is grossly normal.  LEFT VENTRICLE PLAX 2D LVIDd:         4.60 cm   Diastology LVIDs:         2.80 cm   LV e' medial:    6.20 cm/s LV PW:         1.00 cm   LV E/e' medial:  9.6 LV IVS:        0.90 cm   LV e' lateral:   6.74 cm/s LVOT diam:     1.70 cm   LV E/e' lateral: 8.8 LV SV:         55 LV SV Index:   34 LVOT Area:     2.27 cm  RIGHT VENTRICLE             IVC RV Basal diam:  2.60 cm     IVC diam: 1.80 cm RV S prime:     11.60 cm/s TAPSE (M-mode): 2.4 cm LEFT ATRIUM             Index  RIGHT ATRIUM           Index LA diam:        3.10 cm 1.90 cm/m   RA Area:     12.40 cm LA Vol (A2C):   46.9 ml 28.76 ml/m  RA Volume:   27.90 ml  17.11 ml/m LA Vol (A4C):   35.5 ml 21.77 ml/m LA Biplane Vol: 40.5 ml 24.83 ml/m  AORTIC VALVE LVOT Vmax:   108.00 cm/s LVOT Vmean:  70.400 cm/s LVOT VTI:    0.242 m  AORTA Ao Root diam: 2.80 cm Ao Asc diam:  2.90 cm MITRAL VALVE                TRICUSPID VALVE MV Area (PHT): 3.42 cm    TR Peak grad:   21.3 mmHg MV Decel Time: 222 msec    TR Vmax:        231.00 cm/s MV E velocity: 59.50 cm/s MV A velocity: 88.60 cm/s  SHUNTS MV E/A ratio:  0.67        Systemic VTI:  0.24 m                            Systemic Diam: 1.70 cm Lennie Odor MD Electronically signed by Lennie Odor MD Signature Date/Time: 08/02/2023/4:12:59 PM    Final    EEG adult  Result Date: 08/02/2023 Charlsie Quest, MD     08/02/2023  2:08 PM Patient Name: SUESAN FOLZ MRN: [id number] Epilepsy Attending: Charlsie Quest Referring Physician/Provider: Orland Mustard, MD Date: 08/02/2023 Duration: 23.36 mins Patient history: 82yo F with recurrent episodes of confusion and falls with no recollection of those events. EEG to evaluate for seizure. Level of alertness: Awake, asleep AEDs during EEG study: None Technical aspects: This EEG study was done with scalp electrodes positioned according to the 10-20 International system of electrode placement. Electrical activity was reviewed with band pass filter of 1-70Hz , sensitivity of 7 uV/mm, display speed of 27mm/sec with a 60Hz  notched filter applied as appropriate. EEG data were recorded continuously and digitally stored.  Video monitoring was available and reviewed as appropriate. Description: The posterior dominant rhythm consists of 8-9 Hz activity of moderate voltage (25-35 uV) seen predominantly in posterior head regions, symmetric and reactive to eye opening and eye closing. Sleep was characterized by vertex waves, sleep spindles (12 to 14 Hz), maximal frontocentral region. Sharp transients were noted in left temporal region. Hyperventilation and photic stimulation were not performed.   IMPRESSION: This study is within normal limits. No seizures or epileptiform discharges were seen throughout the recording. A normal interictal EEG does not exclude the diagnosis of epilepsy. Charlsie Quest   MR BRAIN WO CONTRAST  Result Date:  08/01/2023 CLINICAL DATA:  Altered mental status.  Fall. EXAM: MRI HEAD WITHOUT CONTRAST TECHNIQUE: Multiplanar, multiecho pulse sequences of the brain and surrounding structures were obtained without intravenous contrast. COMPARISON:  Head CT 07/19/2023. FINDINGS: Brain: No acute infarct or hemorrhage. No mass or midline shift. No hydrocephalus or extra-axial collection. No abnormal susceptibility. Vascular: Normal flow voids. Skull and upper cervical spine: Normal marrow signal. Sinuses/Orbits: Unremarkable. Other: Small right parietal scalp contusion. IMPRESSION: Small right parietal scalp contusion.  Otherwise normal brain MRI. Electronically Signed   By: Orvan Falconer M.D.   On: 08/01/2023 15:46   DG Chest Port 1 View  Result Date: 08/01/2023 CLINICAL DATA:  Confusion, falls EXAM: PORTABLE CHEST 1 VIEW COMPARISON:  06/05/2012  FINDINGS: Transverse diameter of heart is increased. There are no signs of pulmonary edema. Linear densities are seen in the lower lung fields. Low lung volumes. There is no pleural effusion or pneumothorax. IMPRESSION: There are no signs of pulmonary edema or focal pulmonary consolidation. Linear densities in the lower lung fields may suggest crowding of markings due to low lung volumes are subsegmental atelectasis or scarring. Electronically Signed   By: Ernie Avena M.D.   On: 08/01/2023 14:18      Signature  -   Susa Raring M.D on 08/03/2023 at 12:08 PM   -  To page go to [email or web address]

## 2023-08-03 NOTE — Progress Notes (Signed)
EEG maint complete. Had to replace t4 t6 o2 fp1 fp2 pz a1. No breakdown noted

## 2023-08-03 NOTE — Evaluation (Signed)
Clinical/Bedside Swallow Evaluation Patient Details  Name: Gail Lester MRN: [id number] Date of Birth: 20-Feb-1941  Today's Date: 08/03/2023 Time: SLP Start Time (ACUTE ONLY): 6433 SLP Stop Time (ACUTE ONLY): 0935 SLP Time Calculation (min) (ACUTE ONLY): 10 min  Past Medical History:  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   Epidermal cyst 01/21/2017   Fibrocystic breast 02/10/2013   Left h/o FN biopsy    Ganglion cyst 11/14/2014   Right 2nd finger   GERD (gastroesophageal reflux disease) 10/09/2020   Hemangioma 09/28/2014   History of cataracts    Previous surgery to remove bilaterally   History of chicken pox    as a child   History of colonic polyp 11/21/2017   History of measles    as a child   History of mumps    as a child   Hypercholesteremia    Hyperlipidemia, mixed 02/10/2013   Melanocytic nevi, unspecified 09/28/2014   Mild neurocognitive disorder, unclear etiology 11/21/2020   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 08/14/2015   Onychomycosis 02/24/2017   Osteopenia 05/23/2017   Seborrheic keratosis 09/28/2014   Type 2 diabetes mellitus 02/10/2013   Past Surgical History:  Past Surgical History:  Procedure Laterality Date   APPENDECTOMY  1955   BREAST BIOPSY  1985-1990   benign   EYE SURGERY Right 04/2019   cataract removal   EYE SURGERY Left 03/2020   cataract removal   ORIF WRIST FRACTURE  12/09/2011   Procedure: OPEN REDUCTION INTERNAL FIXATION (ORIF) WRIST FRACTURE;  Surgeon: Sharma Covert;  Location: [facility] OR;  Service: Orthopedics;  Laterality: Left;   HPI:  Patient is an 82 y.o. female with PMH: DM-2, GERD, HLD. She was assessed by OP Neurology in 2021 for subjective cognitive decline and met criteria for Mild Neurocognitive Disorder with memory being the primary area of impairment.  She presented to the hospital on 08/01/2023 with c/o recurrent episodes of confusion with falls  and no recollection of these events. She was brought to the hospital by her neighbor who provided some history: patient had an unwitnessed fall 06/27/23, went to her PCP and had CT head; she had a second fall two weeks prior to this admission without LOC; she fell again day prior to admission; neighbor reported that after falls, patient becomes confused. In ER, CXR did not show any acute finding; MRI brain showed Small right parietal scalp contusion but otherwise normal MRI.    Assessment / Plan / Recommendation  Clinical Impression  Patient is not currently presenting with clinical s/s of dysphagia as per this bedside swallow evaluation. She endorses 30 lb unintentional weight loss in the past 5-6 months and during this time she reports her health has not been good. She denies that her poor PO intake is due to GERD or any swallowing impairment. When patient drinking thin liquids (water) via straw sips, swallow initiation appeared timely and no overt s/s aspiration. No further skilled SLP intervention warranted in regards to her swallow function. SLP Visit Diagnosis: Dysphagia, unspecified (R13.10)    Aspiration Risk  No limitations    Diet Recommendation Regular;Thin liquid    Liquid Administration via: Cup;Straw Medication Administration: Whole meds with liquid Supervision: Patient able to self feed Postural Changes: Seated upright at 90 degrees    Other  Recommendations Oral Care Recommendations: Oral care BID;Patient independent with oral care    Recommendations for  follow up therapy are one component of a multi-disciplinary discharge planning process, led by the attending physician.  Recommendations may be updated based on patient status, additional functional criteria and insurance authorization.  Follow up Recommendations No SLP follow up      Assistance Recommended at Discharge Intermittent Supervision/Assistance  Functional Status Assessment Patient has not had a recent decline in  their functional status  Frequency and Duration min 1 x/week          Prognosis        Swallow Study   General Date of Onset: 08/01/23 HPI: Patient is an 82 y.o. female with PMH: DM-2, GERD, HLD. She was assessed by OP Neurology in 2021 for subjective cognitive decline and met criteria for Mild Neurocognitive Disorder with memory being the primary area of impairment.  She presented to the hospital on 08/01/2023 with c/o recurrent episodes of confusion with falls and no recollection of these events. She was brought to the hospital by her neighbor who provided some history: patient had an unwitnessed fall 06/27/23, went to her PCP and had CT head; she had a second fall two weeks prior to this admission without LOC; she fell again day prior to admission; neighbor reported that after falls, patient becomes confused. In ER, CXR did not show any acute finding; MRI brain showed Small right parietal scalp contusion but otherwise normal MRI. Type of Study: Bedside Swallow Evaluation Previous Swallow Assessment: none found Diet Prior to this Study: Regular;Thin liquids (Level 0) Temperature Spikes Noted: No Respiratory Status: Room air History of Recent Intubation: No Behavior/Cognition: Alert;Cooperative;Pleasant mood Oral Cavity Assessment: Within Functional Limits Oral Care Completed by SLP: No Oral Cavity - Dentition: Adequate natural dentition Vision: Functional for self-feeding Self-Feeding Abilities: Able to feed self Patient Positioning: Upright in bed Baseline Vocal Quality: Normal Volitional Cough: Strong Volitional Swallow: Able to elicit    Oral/Motor/Sensory Function Overall Oral Motor/Sensory Function: Within functional limits   Ice Chips     Thin Liquid Thin Liquid: Within functional limits Presentation: Self Fed;Straw    Nectar Thick     Honey Thick     Puree Puree: Not tested   Solid     Solid: Not tested     Angela Nevin, MA, CCC-SLP Speech Therapy

## 2023-08-03 NOTE — Plan of Care (Signed)

## 2023-08-03 NOTE — Evaluation (Signed)
Occupational Therapy Evaluation Patient Details Name: Gail Lester MRN: [id number] DOB: Jun 14, 1941 Today's Date: 08/03/2023   History of Present Illness Pt is an 82 y/o F admitted on 08/01/23 after presenting with c/o recurrent episodes of confusion & falls with no recollection of these events. Pt is being treated for confusion & disorientation. MRI was negative for acute findings. PMH: DM2, HLD, GERD, arthritis, measles, HLD, mild neurocognitive disorder of unclear etiology   Clinical Impression   Pt admitted for above dx, PTA pt reports being active and independent in bADLs/iADLs but has a history of falls. Pt currently completing transfers and ambulating in room with CGA, she does report that her balance feels off still. Pt is very pleasant and blames unmanaged DM for the frequent bouts of falls and blacking out. Assured patient that her diet has better than most diabetics but educated her on a proper balanced diet and ways to continue to eat fruits but intake less sugars. Pt declined wanting to speak to dietician. Pt would benefit from continued acute skilled OT services to address deficits and ensure balance is safe for her to DC. Patient would benefit from post acute Home OT services to help maximize functional independence in natural environment        If plan is discharge home, recommend the following: Assistance with cooking/housework    Functional Status Assessment  Patient has had a recent decline in their functional status and demonstrates the ability to make significant improvements in function in a reasonable and predictable amount of time.  Equipment Recommendations  None recommended by OT    Recommendations for Other Services       Precautions / Restrictions Precautions Precautions: Fall Precaution Comments: EEG Restrictions Weight Bearing Restrictions: No      Mobility Bed Mobility Overal bed mobility: Needs Assistance Bed Mobility: Supine to Sit, Sit to Supine      Supine to sit: Supervision, Used rails, HOB elevated Sit to supine: Supervision, HOB elevated        Transfers Overall transfer level: Needs assistance Equipment used: None Transfers: Sit to/from Stand Sit to Stand: Contact guard assist                  Balance Overall balance assessment: Needs assistance, History of Falls Sitting-balance support: Feet supported Sitting balance-Leahy Scale: Fair     Standing balance support: During functional activity, Bilateral upper extremity supported, Reliant on assistive device for balance Standing balance-Leahy Scale: Fair                             ADL either performed or assessed with clinical judgement   ADL Overall ADL's : Needs assistance/impaired Eating/Feeding: Independent;Sitting   Grooming: Standing;Oral care;Contact guard assist   Upper Body Bathing: Modified independent;Sitting   Lower Body Bathing: Set up;Sitting/lateral leans;Supervison/ safety   Upper Body Dressing : Modified independent;Sitting   Lower Body Dressing: Supervision/safety;Sitting/lateral leans   Toilet Transfer: Contact guard assist;Stand-pivot;BSC/3in1   Toileting- Clothing Manipulation and Hygiene: Sit to/from stand;Contact guard assist       Functional mobility during ADLs: Contact guard assist General ADL Comments: Pt very concerned about her diet, thinking that her Diabetes has caused the fall. Discussed with patient their particular diet and educated pt on typical concerns with DM but her diet overall seems very healthy.     Vision         Perception         Praxis  Pertinent Vitals/Pain Pain Assessment Pain Assessment: No/denies pain     Extremity/Trunk Assessment Upper Extremity Assessment Upper Extremity Assessment: Generalized weakness   Lower Extremity Assessment Lower Extremity Assessment: Generalized weakness   Cervical / Trunk Assessment Cervical / Trunk Assessment: Normal    Communication Communication Communication: No apparent difficulties   Cognition Arousal: Alert Behavior During Therapy: WFL for tasks assessed/performed Overall Cognitive Status: Within Functional Limits for tasks assessed                                       General Comments  HR up to 117 with MMT testing    Exercises     Shoulder Instructions      Home Living Family/patient expects to be discharged to:: Private residence Living Arrangements: Alone Available Help at Discharge: Neighbor Type of Home: House Home Access: Level entry     Home Layout: One level     Bathroom Shower/Tub: Chief Strategy Officer: Standard     Home Equipment: None      Lives With: Alone    Prior Functioning/Environment Prior Level of Function : Independent/Modified Independent;Driving             Mobility Comments: 3 falls since July 4th, Pt is very active & continues walking daily, independent without AD ADLs Comments: ind        OT Problem List: Impaired balance (sitting and/or standing)      OT Treatment/Interventions: Self-care/ADL training;Balance training;Therapeutic exercise;Therapeutic activities;Patient/family education    OT Goals(Current goals can be found in the care plan section) Acute Rehab OT Goals Patient Stated Goal: To go home OT Goal Formulation: With patient Time For Goal Achievement: 08/17/23 Potential to Achieve Goals: Good ADL Goals Pt Will Perform Grooming: with modified independence;standing Pt Will Perform Lower Body Dressing: with modified independence;sitting/lateral leans Pt Will Transfer to Toilet: with modified independence;ambulating Additional ADL Goal #1: Pt will demonstrate ability to complete home management iADL at Mod I level.  OT Frequency: Min 1X/week    Co-evaluation              AM-PAC OT "6 Clicks" Daily Activity     Outcome Measure Help from another person eating meals?: None Help from  another person taking care of personal grooming?: A Little Help from another person toileting, which includes using toliet, bedpan, or urinal?: A Little Help from another person bathing (including washing, rinsing, drying)?: A Little Help from another person to put on and taking off regular upper body clothing?: A Little Help from another person to put on and taking off regular lower body clothing?: A Little 6 Click Score: 19   End of Session Equipment Utilized During Treatment: Gait belt Nurse Communication: Mobility status  Activity Tolerance: Patient tolerated treatment well Patient left: in bed;with call bell/phone within reach;with bed alarm set  OT Visit Diagnosis: Unsteadiness on feet (R26.81);History of falling (Z91.81);Other symptoms and signs involving cognitive function                Time: 1354-1437 OT Time Calculation (min): 43 min Charges:  OT General Charges $OT Visit: 1 Visit OT Evaluation $OT Eval Moderate Complexity: 1 Mod OT Treatments $Self Care/Home Management : 8-22 mins $Therapeutic Activity: 8-22 mins  08/03/2023  AB, OTR/L  Acute Rehabilitation Services  Office: [phone]   Tristan Schroeder 08/03/2023, 4:30 PM

## 2023-08-03 NOTE — Procedures (Signed)
Patient Name: Gail Lester  MRN: [id number]  Epilepsy Attending: Charlsie Quest  Referring Physician/Provider: Harolyn Rutherford, MD  Duration: 08/02/2023 1546 to 08/03/2023 1546   Patient history: 82yo F with recurrent episodes of confusion and falls with no recollection of those events. EEG to evaluate for seizure.   Level of alertness: Awake, asleep   AEDs during EEG study: None   Technical aspects: This EEG study was done with scalp electrodes positioned according to the 10-20 International system of electrode placement. Electrical activity was reviewed with band pass filter of 1-70Hz , sensitivity of 7 uV/mm, display speed of 69mm/sec with a 60Hz  notched filter applied as appropriate. EEG data were recorded continuously and digitally stored.  Video monitoring was available and reviewed as appropriate.   Description: The posterior dominant rhythm consists of 8-9 Hz activity of moderate voltage (25-35 uV) seen predominantly in posterior head regions, symmetric and reactive to eye opening and eye closing. Sleep was characterized by vertex waves, sleep spindles (12 to 14 Hz), maximal frontocentral region. Sharp transients were noted in left temporal region. Hyperventilation and photic stimulation were not performed.     Of note, parts of the study were difficult to interpret due to significant electrode artifact.   IMPRESSION: This study is within normal limits. No seizures or epileptiform discharges were seen throughout the recording.   A normal interictal EEG does not exclude the diagnosis of epilepsy.   Caycee Wanat Annabelle Harman

## 2023-08-03 NOTE — Progress Notes (Signed)
PHARMACIST - PHYSICIAN ORDER COMMUNICATION  CONCERNING: P&T Medication Policy on Herbal Medications  DESCRIPTION:  This patient's order for:  lutein  has been noted.  This product(s) is classified as an "herbal" or natural product. Due to a lack of definitive safety studies or FDA approval, nonstandard manufacturing practices, plus the potential risk of unknown drug-drug interactions while on inpatient medications, the Pharmacy and Therapeutics Committee does not permit the use of "herbal" or natural products of this type within [facility].   ACTION TAKEN: The pharmacy department is unable to verify this order at this time and your patient has been informed of this safety policy. Please reevaluate patient's clinical condition at discharge and address if the herbal or natural product(s) should be resumed at that time.  Loura Back, PharmD, BCPS 9:05 PM

## 2023-08-04 DIAGNOSIS — R569 Unspecified convulsions: Secondary | ICD-10-CM | POA: Diagnosis not present

## 2023-08-04 DIAGNOSIS — R410 Disorientation, unspecified: Secondary | ICD-10-CM | POA: Diagnosis not present

## 2023-08-04 LAB — CBC WITH DIFFERENTIAL/PLATELET
Abs Immature Granulocytes: 0.02 10*3/uL (ref 0.00–0.07)
Basophils Absolute: 0 10*3/uL (ref 0.0–0.1)
Basophils Relative: 1 %
Eosinophils Absolute: 0.3 10*3/uL (ref 0.0–0.5)
Eosinophils Relative: 4 %
HCT: 35.7 % — ABNORMAL LOW (ref 36.0–46.0)
Hemoglobin: 11.4 g/dL — ABNORMAL LOW (ref 12.0–15.0)
Immature Granulocytes: 0 %
Lymphocytes Relative: 25 %
Lymphs Abs: 1.7 10*3/uL (ref 0.7–4.0)
MCH: 28.7 pg (ref 26.0–34.0)
MCHC: 31.9 g/dL (ref 30.0–36.0)
MCV: 89.9 fL (ref 80.0–100.0)
Monocytes Absolute: 0.7 10*3/uL (ref 0.1–1.0)
Monocytes Relative: 10 %
Neutro Abs: 4.2 10*3/uL (ref 1.7–7.7)
Neutrophils Relative %: 60 %
Platelets: 263 10*3/uL (ref 150–400)
RBC: 3.97 MIL/uL (ref 3.87–5.11)
RDW: 13 % (ref 11.5–15.5)
WBC: 6.8 10*3/uL (ref 4.0–10.5)
nRBC: 0 % (ref 0.0–0.2)

## 2023-08-04 LAB — BASIC METABOLIC PANEL WITH GFR
Anion gap: 7 (ref 5–15)
BUN: 11 mg/dL (ref 8–23)
CO2: 26 mmol/L (ref 22–32)
Calcium: 9.1 mg/dL (ref 8.9–10.3)
Chloride: 105 mmol/L (ref 98–111)
Creatinine, Ser: 0.68 mg/dL (ref 0.44–1.00)
GFR, Estimated: >60 mL/min (ref >60)
Glucose, Bld: 114 mg/dL — ABNORMAL HIGH (ref 70–99)
Potassium: 3.8 mmol/L (ref 3.5–5.1)
Sodium: 138 mmol/L (ref 135–145)

## 2023-08-04 LAB — BRAIN NATRIURETIC PEPTIDE: B Natriuretic Peptide: 34.9 pg/mL (ref 0.0–100.0)

## 2023-08-04 LAB — RPR: RPR Ser Ql: NONREACTIVE

## 2023-08-04 LAB — MAGNESIUM: Magnesium: 1.7 mg/dL (ref 1.7–2.4)

## 2023-08-04 LAB — GLUCOSE, CAPILLARY: Glucose-Capillary: 105 mg/dL — ABNORMAL HIGH (ref 70–99)

## 2023-08-04 LAB — CK: Total CK: 266 U/L — ABNORMAL HIGH (ref 38–234)

## 2023-08-04 MED ORDER — GLIMEPIRIDE 1 MG PO TABS: 1.0000 mg | ORAL_TABLET | Freq: Every day | ORAL | 0 refills | Status: DC

## 2023-08-04 NOTE — Discharge Instructions (Addendum)
Do not drive, operate heavy machinery, perform activities at heights, swimming or participation in water activities or provide baby sitting services until you have seen by Primary MD or a Neurologist and advised to do so again.  Follow with Primary MD Bradd Canary, MD in 2 days   Get CBC, CMP, 2 view Chest X ray -  checked next visit with your primary MD get your scalp wound rechecked  Activity: As tolerated with Full fall precautions use walker/cane & assistance as needed  Disposition Home    Diet: Heart Healthy, low carbohydrate.  Accuchecks 4 times/day, Once in AM empty stomach and then before each meal. Log in all results and show them to your Prim.MD in 3 days. If any glucose reading is under 80 or above 300 call your Prim MD immidiately. Follow Low glucose instructions for glucose under 80 as instructed.  On your next visit with your primary care physician please Get Medicines reviewed and adjusted.  Please request your Prim.MD to go over all Hospital Tests and Procedure/Radiological results at the follow up, please get all Hospital records sent to your Prim MD by signing hospital release before you go home.  If you experience worsening of your admission symptoms, develop shortness of breath, life threatening emergency, suicidal or homicidal thoughts you must seek medical attention immediately by calling 911 or calling your MD immediately  if symptoms less severe.  You Must read complete instructions/literature along with all the possible adverse reactions/side effects for all the Medicines you take and that have been prescribed to you. Take any new Medicines after you have completely understood and accpet all the possible adverse reactions/side effects.

## 2023-08-04 NOTE — Progress Notes (Signed)
LTM EEG discontinued - no skin breakdown at unhook.   

## 2023-08-04 NOTE — Progress Notes (Signed)
LTM maint complete - no skin breakdown under: Fp1 F3 F7 Serviced Fp1 F3 F7  [facility] monitored, Event button test confirmed by [facility].

## 2023-08-04 NOTE — Procedures (Addendum)
Patient Name: Gail Lester  MRN: [id number]  Epilepsy Attending: Charlsie Quest  Referring Physician/Provider: Harolyn Rutherford, MD  Duration: 08/03/2023 1546 to 08/04/2023 4098   Patient history: 82yo F with recurrent episodes of confusion and falls with no recollection of those events. EEG to evaluate for seizure.   Level of alertness: Awake, asleep   AEDs during EEG study: None   Technical aspects: This EEG study was done with scalp electrodes positioned according to the 10-20 International system of electrode placement. Electrical activity was reviewed with band pass filter of 1-70Hz , sensitivity of 7 uV/mm, display speed of 62mm/sec with a 60Hz  notched filter applied as appropriate. EEG data were recorded continuously and digitally stored.  Video monitoring was available and reviewed as appropriate.   Description: The posterior dominant rhythm consists of 8-9 Hz activity of moderate voltage (25-35 uV) seen predominantly in posterior head regions, symmetric and reactive to eye opening and eye closing. Sleep was characterized by vertex waves, sleep spindles (12 to 14 Hz), maximal frontocentral region. Sharp transients were noted in left temporal region. Hyperventilation and photic stimulation were not performed.      Of note, parts of the study were difficult to interpret due to significant electrode artifact.   IMPRESSION: This study is within normal limits. No seizures or epileptiform discharges were seen throughout the recording.   A normal interictal EEG does not exclude the diagnosis of epilepsy.   Pier Bosher Annabelle Harman

## 2023-08-04 NOTE — Discharge Summary (Signed)
Gail Lester XBM:[id number] DOB: 1941/09/30 DOA: 08/01/2023  PCP: Bradd Canary, MD  Admit date: 08/01/2023  Discharge date: 08/04/2023  Admitted From: Home   Disposition:  Home   Recommendations for Outpatient Follow-up:   Follow up with PCP in 1-2 weeks  PCP Please obtain BMP/CBC, 2 view CXR in 1week,  (see Discharge instructions)   PCP Please follow up on the following pending results: Please check patient's scalp wound next visit, monitor BP, glycemic control and CBG results, needs outpatient neurology follow-up in 7 to 10 days for possible age-related cognitive decline versus early dementia. If symptoms recur may consider a 30-day event monitor.   Home Health: PT, RN   Equipment/Devices: as below  Consultations: None  Discharge Condition: Stable    CODE STATUS: Full    Diet Recommendation: Heart Healthy Low Carb  Chief Complaint  Patient presents with   Fall     Brief history of present illness from the day of admission and additional interim summary     82 y.o. female with medical history significant of T2DM, HLD, GERD who presented to ED with complaints of recurrent episodes of confusion and falls with no recollection of these events.  No seizure-like activity, tongue bite or incontinence.  Sugars were low after this latest event.                                                                  Hospital Course   Episodes of hypoglycemia causing confusion and disorientation, syncope and collapse- ?  Suspicion for seizures, seen by neurology, thorough neurological workup including overnight EEG, MRI brain unremarkable, no headache or focal deficits, CBGs during the last episode were noted to be extremely low, she does take high-dose Amaryl and A1c is 6.3 I think hypoglycemia is causing some of her  neurological events and certainly has syncope and collapse, age-related cognitive decline or early dementia can also be playing a secondary role.  She did well with PT OT, currently is back to her baseline, no focal deficits or headache, will be discharged home with home PT, RN with close outpatient follow-up with PCP, request PCP to arrange for outpatient neurology follow-up in 1 to 2 weeks and monitor glycemic control closely.  Son bedside agrees with the plan.   Possible Syncope and collapse   Kindly see above.  Not orthostatic, worked with PT well, stable echo , soft but stable blood pressures.  Will get home PT and walker.  Small scalp laceration stable, stable on telemetry, some nighttime resting bradycardia but excellent chronotropic response in daytime with stable blood pressures.   HTN - BP soft but stable, DC ARB.   Hyperlipidemia, mixed  Continue crestor 20 mg daily    Hypokalemia.  Replaced.   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 [facility] and follow-up with PCP in 2 to 3 days, PCP to adjust medication further as suitable.  Quested to follow-up with PCP in 2 days with all Accu-Chek results, testing supplies provided.  Diabetic education provided.  CBG (last 3)  Recent Labs    08/03/23 1643 08/03/23 2100 08/04/23 0833  GLUCAP 180* 137* 105*   Lab Results  Component Value Date   HGBA1C 6.3 07/02/2023     Discharge diagnosis     Principal Problem:   Confusion and disorientation Active Problems:   possible Syncope and collapse   Type 2 diabetes mellitus with hypoglycemia (HCC)   Hyperlipidemia, mixed    Discharge instructions    Discharge Instructions     Discharge instructions   Complete by: As directed    Do not drive, operate heavy machinery, perform  activities at heights, swimming or participation in water activities or provide baby sitting services until you have seen by Primary MD or a Neurologist and advised to do so again.  Follow with Primary MD Bradd Canary, MD in 2 days, get your scalp wound rechecked  Get CBC, CMP, 2 view Chest X ray -  checked next visit with your primary MD   Activity: As tolerated with Full fall precautions use walker/cane & assistance as needed  Disposition Home    Diet: Heart Healthy, low carbohydrate.  Accuchecks 4 times/day, Once in AM empty stomach and then before each meal. Log in all results and show them to your Prim.MD in 3 days. If any glucose reading is under 80 or above 300 call your Prim MD immidiately. Follow Low glucose instructions for glucose under 80 as instructed.  On your next visit with your primary care physician please Get Medicines reviewed and adjusted.  Please request your Prim.MD to go over all Hospital Tests and Procedure/Radiological results at the follow up, please get all Hospital records sent to your Prim MD by signing hospital release before you go home.  If you experience worsening of your admission symptoms, develop shortness of breath, life threatening emergency, suicidal or homicidal thoughts you must seek medical attention immediately by calling 911 or calling your MD immediately  if symptoms less severe.  You Must read complete instructions/literature along with all the possible adverse reactions/side effects for all the Medicines you take and that have been prescribed to you. Take any new Medicines after you have completely understood and accpet all the possible adverse reactions/side effects.   Increase activity slowly   Complete by: As directed        Discharge Medications   Allergies as of 08/04/2023   No Known Allergies      Medication List     STOP taking these medications    losartan 25 MG tablet Commonly known as: COZAAR       TAKE these  medications    Accu-Chek FastClix Lancet Kit Use to check sugar 4 times a day.  Dx code: E11.9   Accu-Chek Softclix Lancets lancets USE AS DIRECTED What changed: Another medication with the same name was added. Make sure you understand how and when to take each.   TRUEplus Lancets 28G Misc Use 1 lancet in the morning, at noon, and at bedtime. What changed: You  were already taking a medication with the same name, and this prescription was added. Make sure you understand how and when to take each.   ascorbic acid 500 MG tablet Commonly known as: VITAMIN C Take 500 mg by mouth daily.   aspirin 81 MG tablet Take 81 mg by mouth daily.   Blood Glucose Monitor System w/Device Kit Use in the morning, at noon, and at bedtime. What changed:  how much to take how to take this when to take this additional instructions   CALCIUM PO Take 630 mg by mouth daily. Taking 2 tablets of calcium citrate 315 each - 630mg  total   Co-Enzyme Q10 100 MG Caps Take 1 capsule by mouth daily.   glimepiride 1 MG tablet Commonly known as: AMARYL Take 1 tablet (1 mg total) by mouth daily with breakfast. What changed:  medication strength how much to take   glucosamine-chondroitin 500-400 MG tablet Take 1 tablet by mouth daily.   IRON 100/C PO Take 1 tablet by mouth daily.   Krill Oil 500 MG Caps Take 1 capsule by mouth daily.   Lancet Device Misc Use in the morning, at noon, and at bedtime. May substitute to any manufacturer covered by patient's insurance.   LUTEIN PO Take 25 mg by mouth daily.   metFORMIN 500 MG 24 hr tablet Commonly known as: GLUCOPHAGE-XR Take 1 tablet (500 mg total) by mouth in the morning, at noon, and at bedtime.   multivitamin tablet Take 1 tablet by mouth daily.   PREVAGEN PO Take 1 tablet by mouth daily.   rosuvastatin 20 MG tablet Commonly known as: CRESTOR TAKE 1 TABLET BY MOUTH DAILY   True Metrix Blood Glucose Test test strip Generic drug: glucose  blood Use in the morning, at noon, and at bedtime. What changed:  how much to take how to take this when to take this   Vitamin D3 50 MCG (2000 UT) Tabs Take 1 tablet by mouth daily.         Follow-up Information     [facility] Follow up.   Specialty: Home Health Services Why: [facility] will provide Home Health PT and will call you within 48 hours of discharge to sachedule visit Contact information: [address] [place] [address] [phone]         [facility] Follow up.   Why: [employer] has provided your rolling walker Contact information: [address] [place] [address] [phone]         Bradd Canary, MD. Schedule an appointment as soon as possible for a visit in 2 day(s).   Specialty: Family Medicine Contact information: [address] [place] [address] [phone]         [facility]. Schedule an appointment as soon as possible for a visit in 1 week(s).   Contact information: [address]     [address] [place] [address] [phone]                Major procedures and Radiology Reports - PLEASE review detailed and final reports thoroughly  -        Overnight EEG with video  Result Date: 08/03/2023 Charlsie Quest, MD     08/03/2023  8:28 AM Patient Name: Gail Lester MRN: [id number] Epilepsy Attending: Charlsie Quest Referring Physician/Provider: Harolyn Rutherford, MD Duration: 08/02/2023 1546 to 08/03/2023 0800  Patient history: 82yo F with recurrent episodes  of confusion and falls with no recollection of those events. EEG to evaluate for seizure.  Level of alertness: Awake, asleep  AEDs during EEG study: None  Technical aspects: This EEG study was done with scalp electrodes positioned according to the 10-20 International system of electrode placement. Electrical activity was reviewed with band pass filter of 1-70Hz , sensitivity  of 7 uV/mm, display speed of 54mm/sec with a 60Hz  notched filter applied as appropriate. EEG data were recorded continuously and digitally stored.  Video monitoring was available and reviewed as appropriate.  Description: The posterior dominant rhythm consists of 8-9 Hz activity of moderate voltage (25-35 uV) seen predominantly in posterior head regions, symmetric and reactive to eye opening and eye closing. Sleep was characterized by vertex waves, sleep spindles (12 to 14 Hz), maximal frontocentral region. Sharp transients were noted in left temporal region. Hyperventilation and photic stimulation were not performed.   Of note, parts of the study were difficult to interpret due to significant electrode artifact.  IMPRESSION: This study is within normal limits. No seizures or epileptiform discharges were seen throughout the recording.  A normal interictal EEG does not exclude the diagnosis of epilepsy.  Charlsie Quest   ECHOCARDIOGRAM COMPLETE  Result Date: 08/02/2023    ECHOCARDIOGRAM REPORT   Patient Name:   REBEKKAH GOLA Date of Exam: 08/02/2023 Medical Rec #:  [id number]     Height:       63.0 in Accession #:    [id number]    Weight:       134.0 lb Date of Birth:  [birth year]     BSA:          1.631 m Patient Age:    82 years      BP:           135/65 mmHg Patient Gender: F             HR:           66 bpm. Exam Location:  Inpatient Procedure: 2D Echo Indications:    syncope  History:        Patient has prior history of Echocardiogram examinations, most                 recent 06/18/2022. Risk Factors:Diabetes and Dyslipidemia.  Sonographer:    Delcie Roch RDCS Referring Phys: [id number] ALLISON WOLFE IMPRESSIONS  1. Left ventricular ejection fraction, by estimation, is 55 to 60%. The left ventricle has normal function. The left ventricle has no regional wall motion abnormalities. Left ventricular diastolic parameters are consistent with Grade I diastolic dysfunction (impaired relaxation).  2. Right  ventricular systolic function is normal. The right ventricular size is normal. There is normal pulmonary artery systolic pressure. The estimated right ventricular systolic pressure is 24.3 mmHg.  3. The mitral valve is grossly normal. No evidence of mitral valve regurgitation. No evidence of mitral stenosis.  4. The aortic valve is tricuspid. Aortic valve regurgitation is not visualized. No aortic stenosis is present.  5. The inferior vena cava is normal in size with greater than 50% respiratory variability, suggesting right atrial pressure of 3 mmHg. Comparison(s): No significant change from prior study. FINDINGS  Left Ventricle: Left ventricular ejection fraction, by estimation, is 55 to 60%. The left ventricle has normal function. The left ventricle has no regional wall motion abnormalities. The left ventricular internal cavity size was normal in size. There is  no left ventricular hypertrophy. Left ventricular diastolic parameters are consistent with Grade I diastolic dysfunction (  impaired relaxation). Right Ventricle: The right ventricular size is normal. No increase in right ventricular wall thickness. Right ventricular systolic function is normal. There is normal pulmonary artery systolic pressure. The tricuspid regurgitant velocity is 2.31 m/s, and  with an assumed right atrial pressure of 3 mmHg, the estimated right ventricular systolic pressure is 24.3 mmHg. Left Atrium: Left atrial size was normal in size. Right Atrium: Right atrial size was normal in size. Pericardium: There is no evidence of pericardial effusion. Presence of epicardial fat layer. Mitral Valve: The mitral valve is grossly normal. No evidence of mitral valve regurgitation. No evidence of mitral valve stenosis. Tricuspid Valve: The tricuspid valve is grossly normal. Tricuspid valve regurgitation is mild . No evidence of tricuspid stenosis. Aortic Valve: The aortic valve is tricuspid. Aortic valve regurgitation is not visualized. No aortic  stenosis is present. Pulmonic Valve: The pulmonic valve was grossly normal. Pulmonic valve regurgitation is not visualized. No evidence of pulmonic stenosis. Aorta: The aortic root and ascending aorta are structurally normal, with no evidence of dilitation. Venous: The inferior vena cava is normal in size with greater than 50% respiratory variability, suggesting right atrial pressure of 3 mmHg. IAS/Shunts: The atrial septum is grossly normal.  LEFT VENTRICLE PLAX 2D LVIDd:         4.60 cm   Diastology LVIDs:         2.80 cm   LV e' medial:    6.20 cm/s LV PW:         1.00 cm   LV E/e' medial:  9.6 LV IVS:        0.90 cm   LV e' lateral:   6.74 cm/s LVOT diam:     1.70 cm   LV E/e' lateral: 8.8 LV SV:         55 LV SV Index:   34 LVOT Area:     2.27 cm  RIGHT VENTRICLE             IVC RV Basal diam:  2.60 cm     IVC diam: 1.80 cm RV S prime:     11.60 cm/s TAPSE (M-mode): 2.4 cm LEFT ATRIUM             Index        RIGHT ATRIUM           Index LA diam:        3.10 cm 1.90 cm/m   RA Area:     12.40 cm LA Vol (A2C):   46.9 ml 28.76 ml/m  RA Volume:   27.90 ml  17.11 ml/m LA Vol (A4C):   35.5 ml 21.77 ml/m LA Biplane Vol: 40.5 ml 24.83 ml/m  AORTIC VALVE LVOT Vmax:   108.00 cm/s LVOT Vmean:  70.400 cm/s LVOT VTI:    0.242 m  AORTA Ao Root diam: 2.80 cm Ao Asc diam:  2.90 cm MITRAL VALVE               TRICUSPID VALVE MV Area (PHT): 3.42 cm    TR Peak grad:   21.3 mmHg MV Decel Time: 222 msec    TR Vmax:        231.00 cm/s MV E velocity: 59.50 cm/s MV A velocity: 88.60 cm/s  SHUNTS MV E/A ratio:  0.67        Systemic VTI:  0.24 m  Systemic Diam: 1.70 cm Lennie Odor MD Electronically signed by Lennie Odor MD Signature Date/Time: 08/02/2023/4:12:59 PM    Final    EEG adult  Result Date: 08/02/2023 Charlsie Quest, MD     08/02/2023  2:08 PM Patient Name: JANEANN PETTRY MRN: [id number] Epilepsy Attending: Charlsie Quest Referring Physician/Provider: Orland Mustard, MD Date: 08/02/2023  Duration: 23.36 mins Patient history: 82yo F with recurrent episodes of confusion and falls with no recollection of those events. EEG to evaluate for seizure. Level of alertness: Awake, asleep AEDs during EEG study: None Technical aspects: This EEG study was done with scalp electrodes positioned according to the 10-20 International system of electrode placement. Electrical activity was reviewed with band pass filter of 1-70Hz , sensitivity of 7 uV/mm, display speed of 54mm/sec with a 60Hz  notched filter applied as appropriate. EEG data were recorded continuously and digitally stored.  Video monitoring was available and reviewed as appropriate. Description: The posterior dominant rhythm consists of 8-9 Hz activity of moderate voltage (25-35 uV) seen predominantly in posterior head regions, symmetric and reactive to eye opening and eye closing. Sleep was characterized by vertex waves, sleep spindles (12 to 14 Hz), maximal frontocentral region. Sharp transients were noted in left temporal region. Hyperventilation and photic stimulation were not performed.   IMPRESSION: This study is within normal limits. No seizures or epileptiform discharges were seen throughout the recording. A normal interictal EEG does not exclude the diagnosis of epilepsy. Charlsie Quest   MR BRAIN WO CONTRAST  Result Date: 08/01/2023 CLINICAL DATA:  Altered mental status.  Fall. EXAM: MRI HEAD WITHOUT CONTRAST TECHNIQUE: Multiplanar, multiecho pulse sequences of the brain and surrounding structures were obtained without intravenous contrast. COMPARISON:  Head CT 07/19/2023. FINDINGS: Brain: No acute infarct or hemorrhage. No mass or midline shift. No hydrocephalus or extra-axial collection. No abnormal susceptibility. Vascular: Normal flow voids. Skull and upper cervical spine: Normal marrow signal. Sinuses/Orbits: Unremarkable. Other: Small right parietal scalp contusion. IMPRESSION: Small right parietal scalp contusion.  Otherwise normal  brain MRI. Electronically Signed   By: Orvan Falconer M.D.   On: 08/01/2023 15:46   DG Chest Port 1 View  Result Date: 08/01/2023 CLINICAL DATA:  Confusion, falls EXAM: PORTABLE CHEST 1 VIEW COMPARISON:  06/05/2012 FINDINGS: Transverse diameter of heart is increased. There are no signs of pulmonary edema. Linear densities are seen in the lower lung fields. Low lung volumes. There is no pleural effusion or pneumothorax. IMPRESSION: There are no signs of pulmonary edema or focal pulmonary consolidation. Linear densities in the lower lung fields may suggest crowding of markings due to low lung volumes are subsegmental atelectasis or scarring. Electronically Signed   By: Ernie Avena M.D.   On: 08/01/2023 14:18   US Carotid Bilateral  Result Date: 07/20/2023 CLINICAL DATA:  Syncope, blurred vision, memory loss and history of diabetes. EXAM: BILATERAL CAROTID DUPLEX ULTRASOUND TECHNIQUE: Wallace Cullens scale imaging, color Doppler and duplex ultrasound were performed of bilateral carotid and vertebral arteries in the neck. COMPARISON:  None Available. FINDINGS: Criteria: Quantification of carotid stenosis is based on velocity parameters that correlate the residual internal carotid diameter with NASCET-based stenosis levels, using the diameter of the distal internal carotid lumen as the denominator for stenosis measurement. The following velocity measurements were obtained: RIGHT ICA:  105/26 cm/sec CCA:  84/12 cm/sec SYSTOLIC ICA/CCA RATIO:  1.8 ECA:  72 cm/sec LEFT ICA:  105/36 cm/sec CCA:  82/14 cm/sec SYSTOLIC ICA/CCA RATIO:  1.6 ECA:  72 cm/sec RIGHT CAROTID ARTERY:  Minimal plaque in the carotid bulb. No evidence of right ICA plaque or stenosis. The right ICA is moderately tortuous. RIGHT VERTEBRAL ARTERY: Antegrade flow with normal waveform and velocity. LEFT CAROTID ARTERY: No focal plaque or evidence of left-sided carotid stenosis. LEFT VERTEBRAL ARTERY: Antegrade flow with normal waveform and velocity.  Incidental tiny cystic nodule in the thyroid isthmus measuring 5 mm does not meet criteria for biopsy or follow-up. IMPRESSION: Minimal plaque in the right carotid bulb. No evidence of carotid stenosis bilaterally. Electronically Signed   By: Irish Lack M.D.   On: 07/20/2023 11:24   CT HEAD WO CONTRAST ( )  Result Date: 07/19/2023 CLINICAL DATA:  Weight loss recent syncope confusion EXAM: CT HEAD WITHOUT CONTRAST TECHNIQUE: Contiguous axial images were obtained from the base of the skull through the vertex without intravenous contrast. RADIATION DOSE REDUCTION: This exam was performed according to the departmental dose-optimization program which includes automated exposure control, adjustment of the mA and/or kV according to patient size and/or use of iterative reconstruction technique. COMPARISON:  None Available. FINDINGS: Brain: No acute territorial infarction, hemorrhage, or intracranial mass. Mild atrophy. The ventricles are nonenlarged. Empty appearing sella. Vascular: No hyperdense vessels.  Carotid vascular calcification Skull: Normal. Negative for fracture or focal lesion. Sinuses/Orbits: No acute finding. Other: None IMPRESSION: 1. No CT evidence for acute intracranial abnormality. 2. Mild atrophy. Electronically Signed   By: Jasmine Pang M.D.   On: 07/19/2023 18:36    Micro Results     No results found for this or any previous visit (from the past 240 hour(s)).  Today   Subjective    Kathryne Sharper today has no headache,no chest abdominal pain,no new weakness tingling or numbness, feels much better wants to go home today.    Objective   Blood pressure 107/65, pulse (!) 53, temperature 98.2 F (36.8 C), temperature source Oral, resp. rate 17, SpO2 95%.   Intake/Output Summary (Last 24 hours) at 08/04/2023 0845 Last data filed at 08/03/2023 2045 Gross per 24 hour  Intake --  Output 700 ml  Net -700 ml    Exam  Awake Alert, No new F.N deficits,    [place].AT,PERRAL Supple  Neck,   Symmetrical Chest wall movement, Good air movement bilaterally, CTAB RRR,No Gallops,   +ve B.Sounds, Abd Soft, Non tender,  No Cyanosis, Clubbing or edema    Data Review   Recent Labs  Lab 08/01/23 1356 08/03/23 0303 08/04/23 0102  WBC 9.2 6.1 6.8  HGB 12.3 11.4* 11.4*  HCT 37.2 36.5 35.7*  PLT 270 238 263  MCV 90.1 91.9 89.9  MCH 29.8 28.7 28.7  MCHC 33.1 31.2 31.9  RDW 13.2 13.2 13.0  LYMPHSABS 0.9  --  1.7  MONOABS 0.6  --  0.7  EOSABS 0.1  --  0.3  BASOSABS 0.0  --  0.0    Recent Labs  Lab 08/01/23 1356 08/02/23 1222 08/03/23 0303 08/04/23 0102  NA 142  --  139 138  K 3.6  --  3.4* 3.8  CL 106  --  108 105  CO2 28  --  22 26  ANIONGAP 8  --  9 7  GLUCOSE 127*  --  96 114*  BUN 16  --  9 11  CREATININE 0.51  --  0.63 0.68  AST 20  --   --   --   ALT 11  --   --   --   ALKPHOS 36*  --   --   --  BILITOT 0.4  --   --   --   ALBUMIN 4.0  --   --   --   TSH  --   --  2.594  --   BNP  --   --   --  34.9  MG  --  1.7  --  1.7  CALCIUM 10.0  --  8.6* 9.1   Lab Results  Component Value Date   HGBA1C 6.3 07/02/2023    Total Time in preparing paper work, data evaluation and todays exam - 35 minutes  Signature  -    Susa Raring M.D on 08/04/2023 at 8:45 AM   -  To page go to [email or web address]

## 2023-08-04 NOTE — Progress Notes (Signed)
Discharge instructions (including medications) discussed with and copy provided to patient/caregiver 

## 2023-08-04 NOTE — Plan of Care (Signed)
Problem: Education: Goal: Knowledge of General Education information will improve Description: Including pain rating scale, medication(s)/side effects and non-pharmacologic comfort measures Outcome: Adequate for Discharge   Problem: Health Behavior/Discharge Planning: Goal: Ability to manage health-related needs will improve Outcome: Adequate for Discharge   Problem: Clinical Measurements: Goal: Ability to maintain clinical measurements within normal limits will improve Outcome: Adequate for Discharge Goal: Will remain free from infection Outcome: Adequate for Discharge Goal: Diagnostic test results will improve Outcome: Adequate for Discharge Goal: Respiratory complications will improve Outcome: Adequate for Discharge Goal: Cardiovascular complication will be avoided Outcome: Adequate for Discharge   Problem: Activity: Goal: Risk for activity intolerance will decrease Outcome: Adequate for Discharge   Problem: Nutrition: Goal: Adequate nutrition will be maintained Outcome: Adequate for Discharge   Problem: Coping: Goal: Level of anxiety will decrease Outcome: Adequate for Discharge   Problem: Elimination: Goal: Will not experience complications related to bowel motility Outcome: Adequate for Discharge Goal: Will not experience complications related to urinary retention Outcome: Adequate for Discharge   Problem: Pain Managment: Goal: General experience of comfort will improve Outcome: Adequate for Discharge   Problem: Safety: Goal: Ability to remain free from injury will improve Outcome: Adequate for Discharge   Problem: Skin Integrity: Goal: Risk for impaired skin integrity will decrease Outcome: Adequate for Discharge   Problem: Education: Goal: Ability to describe self-care measures that may prevent or decrease complications (Diabetes Survival Skills Education) will improve Outcome: Adequate for Discharge Goal: Individualized Educational Video(s) Outcome:  Adequate for Discharge   Problem: Coping: Goal: Ability to adjust to condition or change in health will improve Outcome: Adequate for Discharge   Problem: Fluid Volume: Goal: Ability to maintain a balanced intake and output will improve Outcome: Adequate for Discharge   Problem: Health Behavior/Discharge Planning: Goal: Ability to identify and utilize available resources and services will improve Outcome: Adequate for Discharge Goal: Ability to manage health-related needs will improve Outcome: Adequate for Discharge   Problem: Metabolic: Goal: Ability to maintain appropriate glucose levels will improve Outcome: Adequate for Discharge   Problem: Nutritional: Goal: Maintenance of adequate nutrition will improve Outcome: Adequate for Discharge Goal: Progress toward achieving an optimal weight will improve Outcome: Adequate for Discharge   Problem: Skin Integrity: Goal: Risk for impaired skin integrity will decrease Outcome: Adequate for Discharge   Problem: Tissue Perfusion: Goal: Adequacy of tissue perfusion will improve Outcome: Adequate for Discharge   Problem: Acute Rehab PT Goals(only PT should resolve) Goal: Pt Will Go Supine/Side To Sit Outcome: Adequate for Discharge Goal: Pt Will Go Sit To Supine/Side Outcome: Adequate for Discharge Goal: Pt Will Transfer Bed To Chair/Chair To Bed Outcome: Adequate for Discharge Goal: Pt Will Ambulate Outcome: Adequate for Discharge Goal: Pt/caregiver will Perform Home Exercise Program Outcome: Adequate for Discharge   Problem: SLP Cognition Goals Goal: Patient will utilize external memory aids Description: Patient will utilize external memory aids to facilitate recall of information for improved safety with Outcome: Adequate for Discharge Goal: Patient will demonstrate problem solving skills Description: Patient will demonstrate problem solving skills during functional ADL's with Outcome: Adequate for Discharge Goal:  Patient will demonstrate awareness during Description: Patient will demonstrate awareness during functional ADL for improved safety Outcome: Adequate for Discharge   Problem: Acute Rehab OT Goals (only OT should resolve) Goal: Pt. Will Perform Grooming Outcome: Adequate for Discharge Goal: Pt. Will Perform Lower Body Dressing Outcome: Adequate for Discharge Goal: Pt. Will Transfer To Toilet Outcome: Adequate for Discharge Goal: OT Additional ADL Goal #1  Outcome: Adequate for Discharge

## 2023-08-04 NOTE — TOC Transition Note (Addendum)
Transition of Care [facility]) - CM/SW Discharge Note   Patient Details  Name: Gail Lester MRN: [id number] Date of Birth: [birth year]  Transition of [facility]) CM/SW Contact:  Lawerance Sabal, RN Phone Number: 08/04/2023, 9:07 AM   Clinical Narrative:      LVM requesting callback for patient's son, per RN he requested call to discuss HH.   Notified [facility] that patient will DC to home today. Per notes RW has been ordered 8/9 to be delivered to room.   Spoke w patient's son, questions answered, notified HH liaison to schedule appointments with the son who will staying with her for the next week    Sula Rumple (Son) [phone]    Final next level of care: Home w Home Health Services Barriers to Discharge: No Barriers Identified   Patient Goals and CMS Choice      Discharge Placement                         Discharge Plan and Services Additional resources added to the After Visit Summary for                            [facility] Arranged: PT, RN [facility] Agency: [facility] Date [facility] Agency Contacted: 08/04/23 Time HH Agency Contacted: [phone] Representative spoke with at [facility] Agency: Kandee Keen  Social Determinants of Health (SDOH) Interventions SDOH Screenings   Food Insecurity: No Food Insecurity (08/02/2023)  Housing: Low Risk  (08/02/2023)  Transportation Needs: No Transportation Needs (08/02/2023)  Utilities: Not At Risk (08/02/2023)  Alcohol Screen: Low Risk  (06/25/2023)  Depression (PHQ2-9): Low Risk  (07/02/2023)  Financial Resource Strain: Low Risk  (06/25/2023)  Physical Activity: Sufficiently Active (06/25/2023)  Social Connections: Socially Isolated (06/25/2023)  Stress: Stress Concern Present (06/25/2023)  Tobacco Use: Medium Risk (08/01/2023)     Readmission Risk Interventions     No data to display

## 2023-08-05 ENCOUNTER — Encounter: Payer: Self-pay | Admitting: Physician Assistant

## 2023-08-05 ENCOUNTER — Other Ambulatory Visit (HOSPITAL_COMMUNITY): Payer: Self-pay

## 2023-08-05 LAB — ALDOLASE: Aldolase: 4.9 U/L (ref 3.3–10.3)

## 2023-08-06 ENCOUNTER — Inpatient Hospital Stay: Payer: Medicare Other | Admitting: Family

## 2023-08-06 ENCOUNTER — Telehealth: Payer: Self-pay | Admitting: *Deleted

## 2023-08-06 ENCOUNTER — Telehealth: Payer: Self-pay | Admitting: Family

## 2023-08-06 ENCOUNTER — Ambulatory Visit: Payer: Self-pay

## 2023-08-06 ENCOUNTER — Telehealth: Payer: Self-pay | Admitting: Family Medicine

## 2023-08-06 ENCOUNTER — Encounter: Payer: Self-pay | Admitting: *Deleted

## 2023-08-06 VITALS — BP 98/71 | HR 77 | Temp 98.2°F | Resp 16 | Wt 125.0 lb

## 2023-08-06 DIAGNOSIS — E11649 Type 2 diabetes mellitus with hypoglycemia without coma: Secondary | ICD-10-CM | POA: Diagnosis not present

## 2023-08-06 DIAGNOSIS — Z7984 Long term (current) use of oral hypoglycemic drugs: Secondary | ICD-10-CM | POA: Diagnosis not present

## 2023-08-06 DIAGNOSIS — R634 Abnormal weight loss: Secondary | ICD-10-CM | POA: Diagnosis not present

## 2023-08-06 DIAGNOSIS — R296 Repeated falls: Secondary | ICD-10-CM | POA: Diagnosis not present

## 2023-08-06 DIAGNOSIS — R410 Disorientation, unspecified: Secondary | ICD-10-CM | POA: Diagnosis not present

## 2023-08-06 DIAGNOSIS — D649 Anemia, unspecified: Secondary | ICD-10-CM

## 2023-08-06 HISTORY — DX: Repeated falls: R29.6

## 2023-08-06 HISTORY — DX: Anemia, unspecified: D64.9

## 2023-08-06 LAB — VITAMIN B1: Vitamin B1 (Thiamine): 119.4 nmol/L (ref 66.5–200.0)

## 2023-08-06 MED ORDER — GLUCOSAMINE-CHONDROITIN 500-400 MG PO TABS: 1.0000 | ORAL_TABLET | Freq: Every day | ORAL | 2 refills | Status: AC

## 2023-08-06 NOTE — Assessment & Plan Note (Signed)
Lab Results  Component Value Date   HGBA1C 5.6 08/04/2023   Remain off of sulfonylurea. She continues metformin once daily.

## 2023-08-06 NOTE — Telephone Encounter (Signed)
OK with me.

## 2023-08-06 NOTE — Progress Notes (Signed)
  Care Coordination   Note   08/06/2023 Name: Gail Lester MRN: [id number] DOB: 24-Mar-1941  Gail Lester is a 82 y.o. year old female who sees Bradd Canary, MD for primary care. I reached out to Gail Lester by phone today to offer care coordination services.  [name] was given information about Care Coordination services today including:   The Care Coordination services include support from the care team which includes your Nurse Coordinator, Clinical Social Worker, or Pharmacist.  The Care Coordination team is here to help remove barriers to the health concerns and goals most important to you. Care Coordination services are voluntary, and the patient may decline or stop services at any time by request to their care team member.   Care Coordination Consent Status: Patient agreed to services and verbal consent obtained.   Follow up plan:  Telephone appointment with care coordination team member scheduled for:  08/13/2023  Encounter Outcome:  Pt. Scheduled from referral   Burman Nieves, [facility] Care Coordination Care Guide Direct Dial: [phone]

## 2023-08-06 NOTE — Chronic Care Management (AMB) (Signed)
   08/06/2023  Gail Lester 02-26-1941 [id number]   Reason for Encounter: Patient is not currently enrolled in the CCM program. CCM enrollment status changes to Previously enrolled.   [facility]/Care Management [phone]

## 2023-08-06 NOTE — Transitions of Care (Post Inpatient/ED Visit) (Signed)
08/06/2023  Name: Gail Lester MRN: [id number] DOB: 1941/10/05  Today's TOC FU Call Status: Today's TOC FU Call Status:: Successful TOC FU Call Completed TOC FU Call Complete Date: 08/06/23  Transition Care Management Follow-up Telephone Call Date of Discharge: 08/04/23 Discharge Facility: [facility]) Type of Discharge: Inpatient Admission Primary Inpatient Discharge Diagnosis:: confusion, disorientation- possibly related to hypoglycemia How have you been since you were released from the hospital?: Better ("I am doing much better now; my son is staying with me for a couple of weeks; he lives in [place]; he is helping me get everything straight so I can have some help here at home and understand how to use Benedetto Goad- I am going to have to stop driving.") Any questions or concerns?: Yes Patient Questions/Concerns:: 1) Needs assistance with transporation resources, in home care resources 2) ongoing management of DM with focus on hypoglycemia prevention Patient Questions/Concerns Addressed: Other: (Provided education around potential insurance benefits for transportation, post-hospital meals; placed [facility] Guide referral; scheduled with RN CM Care Coordinator)  Items Reviewed: Did you receive and understand the discharge instructions provided?: Yes (thoroughly reviewed with patient/ son who verbalizes good understanding of same; also reviewed post-hospital PCP OV discharge instructions with patient and her son) Medications obtained,verified, and reconciled?: Yes (Medications Reviewed) (Full medication reconciliation/ review completed; no concerns or discrepancies identified; confirmed patient obtained/ is taking all newly Rx'd medications as instructed; self-manages medications and denies questions/ concerns around medications today) Any new allergies since your discharge?: No Dietary orders reviewed?: Yes Type of Diet Ordered:: "Healthy as possible" Do you have support at  home?: Yes People in Home: alone, child(ren), adult, friend(s) Name of Support/Comfort Primary Source: Reports normally lives alone/ independent in self-care activities; supportive son [name] lives in  but is temporarily stauing with patient post-hospital discharge; friends/ neighbors assists as/ if needed/ indicated when son is not in [place]  Medications Reviewed Today: Medications Reviewed Today     Reviewed by Michaela Corner, RN (Registered Nurse) on 08/06/23 at 1236  Med List Status: <None>   Medication Order Taking? Sig Documenting Provider Last Dose Status Informant  Accu-Chek Softclix Lancets lancets [id number] Yes USE AS DIRECTED Bradd Canary, MD Taking Active Self, Pharmacy Records  Apoaequorin [facility] PO) [id number] Yes Take 1 tablet by mouth daily. [provider] Taking Active Self, Pharmacy Records  aspirin 81 MG tablet [id number] Yes Take 81 mg by mouth daily. [provider] Taking Active Self, Pharmacy Records  Blood Glucose Monitoring Suppl (BLOOD GLUCOSE MONITOR SYSTEM) w/Device KIT [id number] Yes Use in the morning, at noon, and at bedtime. Leroy Sea, MD Taking Active   CALCIUM PO [id number] Yes Take 630 mg by mouth daily. Taking 2 tablets of calcium citrate 315 each - 630mg  total [provider] Taking Active Self, Pharmacy Records  Cholecalciferol (VITAMIN D3) 50 MCG (2000 UT) TABS [id number] Yes Take 1 tablet by mouth daily. [provider] Taking Active Self, Pharmacy Records  Co-Enzyme Q10 100 MG CAPS [id number] Yes Take 1 capsule by mouth daily. [provider] Taking Active Self, Pharmacy Records  glimepiride (AMARYL) 1 MG tablet [id number] No Take 1 tablet (1 mg total) by mouth daily with breakfast.  Patient not taking: Reported on 08/06/2023   Leroy Sea, MD Not Taking Active            Med Note Michaela Corner   Tue Aug 06, 2023 11:38 AM) 08/06/23: Reports during TOC call 08/06/23:  This medication  is currently on  HOLD- indefinitely per PCP at time of HFU OV on 08/06/23  glucosamine-chondroitin 500-400 MG tablet [id number] Yes Take 1 tablet by mouth daily. Bradd Canary, MD Taking Active   Glucose Blood (BLOOD GLUCOSE TEST STRIPS) STRP [id number] Yes Use in the morning, at noon, and at bedtime. Leroy Sea, MD Taking Active   Iron-Vitamin C (IRON 100/C PO) [id number] Yes Take 1 tablet by mouth daily. [provider] Taking Active Self, Pharmacy Records  Krill Oil 500 MG CAPS [id number] Yes Take 1 capsule by mouth daily. [provider] Taking Active Self, Pharmacy Records  Lancet Device MISC [id number] Yes Use in the morning, at noon, and at bedtime. May substitute to any manufacturer covered by patient's insurance. Leroy Sea, MD Taking Active   Lancets Misc. [facility] FASTCLIX LANCET) KIT [id number] Yes Use to check sugar 4 times a day.  Dx code: E11.9 Bradd Canary, MD Taking Active Self, Pharmacy Records  LUTEIN PO [id number] Yes Take 25 mg by mouth daily. [provider] Taking Active Self, Pharmacy Records           Med Note Katrinka Blazing   Tue Nov 08, 2020  7:48 PM) For eye health, recommended by ophthalmologist  metFORMIN (GLUCOPHAGE-XR) 500 MG 24 hr tablet [id number] Yes Take 1 tablet (500 mg total) by mouth in the morning, at noon, and at bedtime. Bradd Canary, MD Taking Active Self, Pharmacy Records  Multiple Vitamin (MULTIVITAMIN) tablet [id number] Yes Take 1 tablet by mouth daily.  [provider] Taking Active Self, Pharmacy Records  rosuvastatin (CRESTOR) 20 MG tablet [id number] Yes TAKE 1 TABLET BY MOUTH DAILY O'Neal, Ronnald Ramp, MD Taking Active Self, Pharmacy Records  TRUEplus Lancets 28G MISC [id number] Yes Use 1 lancet in the morning, at noon, and at bedtime. Leroy Sea, MD Taking Active   vitamin C (ASCORBIC ACID) 500 MG tablet [id number] Yes Take 500 mg by mouth daily. [provider] Taking Active Self, Pharmacy Records  Med  List Note Arvil Persons, CPhT 12/08/11 1422): Patient uses Production designer, theatre/television/film.            Home Care and Equipment/Supplies: Were Home Health Services Ordered?: Yes Name of Home Health Agency:: [facility]  [phone]:  RN and PT Has Agency set up a time to come to your home?: No EMR reviewed for Home Health Orders: Orders present/patient has not received call (refer to CM for follow-up) (scheduled with RN CM Care Coordinator on 08/20/23-- also provided my direct phone number) Any new equipment or medical supplies ordered?: Yes Name of Medical supply agency?: [employer] Were you able to get the equipment/medical supplies?: Yes Do you have any questions related to the use of the equipment/supplies?: No  Functional Questionnaire: Do you need assistance with bathing/showering or dressing?: No Do you need assistance with meal preparation?: No Do you need assistance with eating?: No Do you have difficulty maintaining continence: No Do you need assistance with getting out of bed/getting out of a chair/moving?: No Do you have difficulty managing or taking your medications?: Yes (son currently assisiting with patient's medication management; patient normally manages independently- considering of hiring private duty care assistants to assist with ongoing medication management)  Follow up appointments reviewed: PCP Follow-up appointment confirmed?: Yes Date of PCP follow-up appointment?: 08/06/23 Follow-up Provider: Covering provider for PCP:  verified attended appointment as scheduled this morning Specialist Hospital Follow-up appointment confirmed?: Yes Date of Specialist follow-up appointment?: 08/16/23 Follow-Up Specialty Provider:: Neurology  provider Do you need transportation to your follow-up appointment?: No Do you understand care options if your condition(s) worsen?: Yes-patient verbalized understanding  SDOH Interventions Today    Flowsheet Row Most Recent Value  SDOH  Interventions   Food Insecurity Interventions Intervention Not Indicated  Transportation Interventions Intervention Not Indicated  [recently decided she is no longer able to drive self- son currently assisiting: Needs resources for transportation: [employer] referral placed]      TOC Interventions Today    Flowsheet Row Most Recent Value  TOC Interventions   TOC Interventions Discussed/Reviewed TOC Interventions Discussed  [provided my direct contact information should questions/ concerns/ needs arise post-TOC call, prior to RN CM telephone visit 08/20/23]      Interventions Today    Flowsheet Row Most Recent Value  Chronic Disease   Chronic disease during today's visit Diabetes, Other  [confusion. disorientation, possibly due to hypoglycemia]  General Interventions   General Interventions Discussed/Reviewed General Interventions Discussed, Doctor Visits, Referral to Nurse, Communication with, Durable Medical Equipment (DME), Labs, Community Resources  [referral  placed for [employer] team]  Labs Hgb A1c every 3 months  [Reviewed A1-C trends over last several years, ]  Doctor Visits Discussed/Reviewed Doctor Visits Discussed, Doctor Visits Reviewed, PCP, Specialist  Durable Medical Equipment (DME) [place] R[place] not currently requiring/ using assistive devices-- but has rolling walker for prn use]  PCP/Specialist Visits Compliance with follow-up visit  Communication with RN  Exercise Interventions   Exercise Discussed/Reviewed Exercise Discussed  [role of home health services with importance of participation/ ongoing engagement]  Education Interventions   Education Provided Provided Education  Provided Verbal Education On Medication, [employer], [employer], [employer]  [understanding A1-C trends,  signs/ symptoms hypoglycemia, purpose of/ importance of having updated DPR completed]  Labs Reviewed Hgb A1c  Nutrition Interventions    Nutrition Discussed/Reviewed Nutrition Discussed  Pharmacy Interventions   Pharmacy Dicussed/Reviewed Pharmacy Topics Discussed  [Full medication review with updating medication list in EHR per patient report]  Safety Interventions   Safety Discussed/Reviewed Safety Discussed, Fall Risk      Caryl Pina, RN, BSN, CCRN Alumnus RN CM Care Coordination/ Transition of Care- [facility] Care Management [phone]: direct office

## 2023-08-06 NOTE — Telephone Encounter (Signed)
Pt would like to transfer care to [facility].

## 2023-08-06 NOTE — Assessment & Plan Note (Signed)
Obtain CT's as ordered to evaluate for possible underlying malignancy.

## 2023-08-06 NOTE — Assessment & Plan Note (Addendum)
I suspect that this is primarily due to hypoglycemia. Her previous ongoing use of sulfonylurea in the setting of profound weight loss was most likely cause. BP is also a bit soft- advised pt and son to have her remain off of ARB and sulfonylurea.   A referral to [facility] PT was made at time of discharge and they are expecting a call today from them to initiate home PT.

## 2023-08-06 NOTE — Telephone Encounter (Signed)
Test cancelled.

## 2023-08-06 NOTE — Patient Instructions (Addendum)
VISIT SUMMARY:  During your recent visit, we discussed your history of multiple falls, significant weight loss, and low blood pressure. We also reviewed your recent hospitalization and the results of several tests, including an MRI, carotid check, ultrasound of the heart, and an EEG. The MRI showed some swelling where you hit your head during one of the falls, but the other tests were normal. We also discussed your diabetes and hypertension, and made some changes to your medications.  YOUR PLAN:  -RECURRENT FALLS: You've had several falls recently, some of which caused you to lose consciousness. We're going to continue investigating this issue with a neurology appointment on August 23rd.  -HYPOGLYCEMIA: You had a recent episode of very low blood sugar, which may have contributed to your falls. We've stopped your Glimepiride medication for now.  -WEIGHT LOSS: You've lost a significant amount of weight since December and are having trouble eating. We're going to order CT scans of your chest and abdomen to make sure there's no underlying disease causing this. In the meantime, continue taking your multivitamin with minerals.  -HYPOTENSION: Your blood pressure has been on the low side, so we've stopped your Losartan medication to avoid it getting any lower.

## 2023-08-06 NOTE — Progress Notes (Signed)
Subjective:     Patient ID: Gail Lester, female    DOB: 1941-10-18, 82 y.o.   MRN: [id number]  Chief Complaint  Patient presents with   Hospitalization Follow-up         HPI  Discussed the use of AI scribe software for clinical note transcription with the patient, who gave verbal consent to proceed.  History of Present Illness         Patient is an 82 yr old female who presents today for hospital follow up. Her son accompanies her today. He is in from [place]. She has been experiencing recurrent episodes of confusion/falls which began in July of this year.  She had an unwitnessed fall on 7/4, fall around 6/25 and again on 7/7.  These falls are usually accompanied by confusion. She was noted to be hypoglycemic per EMS with a blood sugar of 20 and overall poor PO intake. She was on a sulfonylurea at that time.  Prior to this most recent admission, a referral was placed by PCP for neurology due to neurocognitive concerns. This is scheduled for 08/16/23.  She had a normal brain MRI on 8/8 noting only a small right parietal scalp contusion.   Bilateral carotid doppler 7/26- Minimal plaque in the right carotid bulb. No evidence of carotid stenosis bilaterally.  2D echo 8/9 noted normal LVEF, grade 1 diastolic dysfunction.   EEG 08/03/23- normal  She has had significant weight loss over the last few months.   Wt Readings from Last 3 Encounters:  08/06/23 125 lb (56.7 kg)  07/02/23 134 lb (60.8 kg)  12/11/22 153 lb 3.2 oz (69.5 kg)   DM2-  Lab Results  Component Value Date   HGBA1C 5.6 08/04/2023   HGBA1C 6.3 07/02/2023   HGBA1C 8.2 (H) 12/11/2022   Lab Results  Component Value Date   MICROALBUR 1.7 12/11/2022   LDLCALC 57 07/02/2023   CREATININE 0.68 08/04/2023   Lab Results  Component Value Date   TSH 2.594 08/03/2023       Health Maintenance Due  Topic Date Due   FOOT EXAM  01/29/2017   COVID-19 Vaccine (7 - 2023-24 season) 02/05/2023   Medicare Annual  Wellness (AWV)  04/17/2023   INFLUENZA VACCINE  07/25/2023    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   Epidermal cyst 01/21/2017   Fibrocystic breast 02/10/2013   Left h/o FN biopsy    Ganglion cyst 11/14/2014   Right 2nd finger   GERD (gastroesophageal reflux disease) 10/09/2020   Hemangioma 09/28/2014   History of cataracts    Previous surgery to remove bilaterally   History of chicken pox    as a child   History of colonic polyp 11/21/2017   History of measles    as a child   History of mumps    as a child   Hypercholesteremia    Hyperlipidemia, mixed 02/10/2013   Melanocytic nevi, unspecified 09/28/2014   Mild neurocognitive disorder, unclear etiology 11/21/2020   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 08/14/2015   Onychomycosis 02/24/2017   Osteopenia 05/23/2017   Seborrheic keratosis 09/28/2014   Type 2 diabetes mellitus 02/10/2013    Past Surgical History:  Procedure Laterality Date   APPENDECTOMY  1955   BREAST BIOPSY  1985-1990   benign   EYE SURGERY Right 04/2019   cataract removal   EYE SURGERY Left 03/2020  cataract removal   ORIF WRIST FRACTURE  12/09/2011   Procedure: OPEN REDUCTION INTERNAL FIXATION (ORIF) WRIST FRACTURE;  Surgeon: Sharma Covert;  Location: [facility] OR;  Service: Orthopedics;  Laterality: Left;    Family History  Problem Relation Age of Onset   Kidney disease Mother    Alcoholism Mother    Diabetes Father    Kidney disease Maternal Grandmother    Cancer Maternal Grandmother        bladder   Cancer Maternal Aunt    Alzheimer's disease Maternal Aunt    Scoliosis Son    Colon cancer Other        grandparent    Social History   Socioeconomic History   Marital status: Divorced    Spouse name: Not on file   Number of children: 2   Years of education: 13   Highest education level: Associate degree: academic program  Occupational History   Occupation:  Retired Therapist, nutritional  Tobacco Use   Smoking status: Former    Current packs/day: 0.00    Types: Cigarettes    Start date: 11/17/1974    Quit date: 11/17/1989    Years since quitting: 33.7   Smokeless tobacco: Never  Substance and Sexual Activity   Alcohol use: No    Comment: rarely while in social settings   Drug use: No   Sexual activity: Not Currently  Other Topics Concern   Not on file  Social History Narrative   Not on file   Social Determinants of Health   Financial Resource Strain: Low Risk  (06/25/2023)   Overall Financial Resource Strain (CARDIA)    Difficulty of Paying Living Expenses: Not very hard  Food Insecurity: No Food Insecurity (08/02/2023)   Hunger Vital Sign    Worried About Running Out of Food in the Last Year: Never true    Ran Out of Food in the Last Year: Never true  Transportation Needs: No Transportation Needs (08/02/2023)   PRAPARE - Administrator, Civil Service (Medical): No    Lack of Transportation (Non-Medical): No  Physical Activity: Sufficiently Active (06/25/2023)   Exercise Vital Sign    Days of Exercise per Week: 7 days    Minutes of Exercise per Session: 30 min  Stress: Stress Concern Present (06/25/2023)   [employer] of Occupational Health - Occupational Stress Questionnaire    Feeling of Stress : To some extent  Social Connections: Socially Isolated (06/25/2023)   Social Connection and Isolation Panel [NHANES]    Frequency of Communication with Friends and Family: Once a week    Frequency of Social Gatherings with Friends and Family: Never    Attends Religious Services: Never    Database administrator or Organizations: No    Attends Engineer, structural: Not on file    Marital Status: Divorced  Intimate Partner Violence: Not At Risk (08/02/2023)   Humiliation, Afraid, Rape, and Kick questionnaire    Fear of Current or Ex-Partner: No    Emotionally Abused: No    Physically Abused: No    Sexually Abused: No     Outpatient Medications Prior to Visit  Medication Sig Dispense Refill   Accu-Chek Softclix Lancets lancets USE AS DIRECTED 300 each 2   Apoaequorin (PREVAGEN PO) Take 1 tablet by mouth daily.     aspirin 81 MG tablet Take 81 mg by mouth daily.     Blood Glucose Monitoring Suppl (BLOOD GLUCOSE MONITOR SYSTEM) w/Device KIT Use in the morning,  at noon, and at bedtime. 1 kit 0   CALCIUM PO Take 630 mg by mouth daily. Taking 2 tablets of calcium citrate 315 each - 630mg  total     Cholecalciferol (VITAMIN D3) 50 MCG (2000 UT) TABS Take 1 tablet by mouth daily.     Co-Enzyme Q10 100 MG CAPS Take 1 capsule by mouth daily.     glimepiride (AMARYL) 1 MG tablet Take 1 tablet (1 mg total) by mouth daily with breakfast. 30 tablet 0   Glucose Blood (BLOOD GLUCOSE TEST STRIPS) STRP Use in the morning, at noon, and at bedtime. 100 strip 0   Iron-Vitamin C (IRON 100/C PO) Take 1 tablet by mouth daily.     Krill Oil 500 MG CAPS Take 1 capsule by mouth daily.     Lancet Device MISC Use in the morning, at noon, and at bedtime. May substitute to any manufacturer covered by patient's insurance. 1 each 0   Lancets Misc. (ACCU-CHEK FASTCLIX LANCET) KIT Use to check sugar 4 times a day.  Dx code: E11.9 1 kit 0   LUTEIN PO Take 25 mg by mouth daily.     metFORMIN (GLUCOPHAGE-XR) 500 MG 24 hr tablet Take 1 tablet (500 mg total) by mouth in the morning, at noon, and at bedtime. 270 tablet 2   Multiple Vitamin (MULTIVITAMIN) tablet Take 1 tablet by mouth daily.      rosuvastatin (CRESTOR) 20 MG tablet TAKE 1 TABLET BY MOUTH DAILY 90 tablet 3   TRUEplus Lancets 28G MISC Use 1 lancet in the morning, at noon, and at bedtime. 100 each 0   vitamin C (ASCORBIC ACID) 500 MG tablet Take 500 mg by mouth daily.     glucosamine-chondroitin 500-400 MG tablet Take 1 tablet by mouth daily. 90 tablet 2   No facility-administered medications prior to visit.    No Known Allergies  ROS See HPI    Objective:    Physical  Exam Constitutional:      General: She is not in acute distress.    Appearance: Normal appearance. She is well-developed.  HENT:     Head: Normocephalic and atraumatic.     Right Ear: External ear normal.     Left Ear: External ear normal.  Eyes:     General: No scleral icterus. Neck:     Thyroid: No thyromegaly.  Cardiovascular:     Rate and Rhythm: Normal rate and regular rhythm.     Heart sounds: Normal heart sounds. No murmur heard. Pulmonary:     Effort: Pulmonary effort is normal. No respiratory distress.     Breath sounds: Normal breath sounds. No wheezing.  Musculoskeletal:     Cervical back: Neck supple.  Skin:    General: Skin is warm and dry.  Neurological:     General: No focal deficit present.     Mental Status: She is alert and oriented to person, place, and time.     Comments: Asking the same question several times during today's visit.   Psychiatric:        Mood and Affect: Mood normal.        Behavior: Behavior normal.        Thought Content: Thought content normal.        Judgment: Judgment normal.      BP 98/71 (BP Location: Right Arm, Patient Position: Sitting, Cuff Size: Small)   Pulse 77   Temp 98.2 F (36.8 C) (Oral)   Resp 16   Wt 125  lb (56.7 kg)   SpO2 97%   BMI 22.14 kg/m  Wt Readings from Last 3 Encounters:  08/06/23 125 lb (56.7 kg)  07/02/23 134 lb (60.8 kg)  12/11/22 153 lb 3.2 oz (69.5 kg)       Assessment & Plan:   Problem List Items Addressed This Visit       Unprioritized   Unintentional weight loss of more than 10% body weight within 6 months - Primary    Obtain CT's as ordered to evaluate for possible underlying malignancy.       Relevant Orders   CT Chest Wo Contrast   CT ABDOMEN PELVIS W CONTRAST   Type 2 diabetes mellitus with hypoglycemia [facility])    Lab Results  Component Value Date   HGBA1C 5.6 08/04/2023   Remain off of sulfonylurea. She continues metformin once daily.       Recurrent falls    I suspect  that this is primarily due to hypoglycemia. Her previous ongoing use of sulfonylurea in the setting of profound weight loss was most likely cause. BP is also a bit soft- advised pt and son to have her remain off of ARB and sulfonylurea.   A referral to [facility] PT was made at time of discharge and they are expecting a call today from them to initiate home PT.       Relevant Orders   AMB Referral to [facility] ([facility] Patients)   Confusion and disorientation    Suspect some underlying dementia. Await pending neurology evaluation. Son is interested in getting her set up with a home health aid to come in a few times a day to check on her and make sure she is taking her medications. We discussed that this is not a covered service by medicare, and would be a out of pocket expense that they will need to set up privately. I will refer her to social work to see if they can provide some resources.    Son states that eventually the plan is to bring her back to [place] with him as her memory worsens.       Relevant Orders   AMB Referral to [facility] ([facility] Patients)   Anemia    Mild, likely due to poor PO intake. Recommended MVI with minerals.       >40 minutes spent on today's visit. Time was spent reviewing medical record, formulating medical plan and discussing son and patient's concerns.  I am having Shevawn T. Kemple maintain her aspirin, multivitamin, CALCIUM PO, Krill Oil, Vitamin D3, LUTEIN PO, Accu-Chek FastClix Lancet, Iron-Vitamin C (IRON 100/C PO), ascorbic acid, Co-Enzyme Q10, Apoaequorin (PREVAGEN PO), rosuvastatin, Accu-Chek Softclix Lancets, metFORMIN, Blood Glucose Monitor System, BLOOD GLUCOSE TEST STRIPS, Lancet Device, TRUEplus Lancets 28G, glimepiride, and glucosamine-chondroitin.  Meds ordered this encounter  Medications   glucosamine-chondroitin 500-400 MG tablet    Sig: Take 1 tablet by mouth daily.    Dispense:  90 tablet    Refill:  2

## 2023-08-06 NOTE — Assessment & Plan Note (Addendum)
Suspect some underlying dementia. Await pending neurology evaluation. Son is interested in getting her set up with a home health aid to come in a few times a day to check on her and make sure she is taking her medications. We discussed that this is not a covered service by medicare, and would be a out of pocket expense that they will need to set up privately. I will refer her to social work to see if they can provide some resources.    Son states that eventually the plan is to bring her back to [place] with him as her memory worsens.

## 2023-08-06 NOTE — Assessment & Plan Note (Signed)
Mild, likely due to poor PO intake. Recommended MVI with minerals.

## 2023-08-06 NOTE — Telephone Encounter (Signed)
Please cancel MRI scheduled downstairs.

## 2023-08-08 ENCOUNTER — Telehealth: Payer: Self-pay | Admitting: Family

## 2023-08-08 ENCOUNTER — Encounter (INDEPENDENT_AMBULATORY_CARE_PROVIDER_SITE_OTHER): Payer: Self-pay

## 2023-08-08 DIAGNOSIS — I10 Essential (primary) hypertension: Secondary | ICD-10-CM | POA: Diagnosis not present

## 2023-08-08 DIAGNOSIS — K219 Gastro-esophageal reflux disease without esophagitis: Secondary | ICD-10-CM | POA: Diagnosis not present

## 2023-08-08 DIAGNOSIS — E782 Mixed hyperlipidemia: Secondary | ICD-10-CM | POA: Diagnosis not present

## 2023-08-08 DIAGNOSIS — Z7984 Long term (current) use of oral hypoglycemic drugs: Secondary | ICD-10-CM | POA: Diagnosis not present

## 2023-08-08 DIAGNOSIS — E119 Type 2 diabetes mellitus without complications: Secondary | ICD-10-CM | POA: Diagnosis not present

## 2023-08-08 NOTE — Telephone Encounter (Signed)
Caller/Agency: [facility]) Callback Number: [phone] Requesting OT/PT/Skilled Nursing/Social Work/Speech Therapy: Skilled Nursing Frequency: 1 w 4  HH is also asking for a call to clear up some medication discrepancies on her D/c paperwork.

## 2023-08-11 ENCOUNTER — Ambulatory Visit (HOSPITAL_BASED_OUTPATIENT_CLINIC_OR_DEPARTMENT_OTHER)
Admission: RE | Admit: 2023-08-11 | Discharge: 2023-08-11 | Disposition: A | Discharge status: Home / Self Care | Payer: Medicare Other | Source: Ambulatory Visit | Attending: Family | Admitting: Family

## 2023-08-11 ENCOUNTER — Encounter (HOSPITAL_BASED_OUTPATIENT_CLINIC_OR_DEPARTMENT_OTHER): Payer: Self-pay

## 2023-08-11 DIAGNOSIS — K8020 Calculus of gallbladder without cholecystitis without obstruction: Secondary | ICD-10-CM | POA: Diagnosis not present

## 2023-08-11 DIAGNOSIS — R634 Abnormal weight loss: Secondary | ICD-10-CM

## 2023-08-11 DIAGNOSIS — R918 Other nonspecific abnormal finding of lung field: Secondary | ICD-10-CM | POA: Insufficient documentation

## 2023-08-11 DIAGNOSIS — I2510 Atherosclerotic heart disease of native coronary artery without angina pectoris: Secondary | ICD-10-CM | POA: Diagnosis not present

## 2023-08-11 DIAGNOSIS — I700 Atherosclerosis of aorta: Secondary | ICD-10-CM | POA: Insufficient documentation

## 2023-08-11 DIAGNOSIS — K5730 Diverticulosis of large intestine without perforation or abscess without bleeding: Secondary | ICD-10-CM | POA: Diagnosis not present

## 2023-08-11 MED ORDER — IOHEXOL 300 MG/ML  SOLN
80.0000 mL | Freq: Once | INTRAMUSCULAR | Status: AC | PRN
  Administered 2023-08-11: 80 mL via INTRAVENOUS

## 2023-08-12 NOTE — Telephone Encounter (Signed)
Left voice mail at phone number provided for them to call back for orders and clarification

## 2023-08-13 ENCOUNTER — Ambulatory Visit: Payer: Self-pay

## 2023-08-13 NOTE — Patient Instructions (Signed)
Visit Information  Thank you for taking time to visit with me today. Please don't hesitate to contact me if I can be of assistance to you.   Following are the goals we discussed today:  - Review resource provided by e-mail  If you are experiencing a Mental Health or Behavioral Health Crisis or need someone to talk to, please call 1-800-273-TALK (toll free, 24 hour hotline) go to [facility] [address] [place] [phone]) call 911  Patient verbalizes understanding of instructions and care plan provided today and agrees to view in MyChart. Active MyChart status and patient understanding of how to access instructions and care plan via MyChart confirmed with patient.     No further follow up required: Please contact me as needed  Bevelyn Ngo, BSW, CDP Social Worker, Certified Dementia Practitioner [facility] Care Management  Care Coordination [phone]

## 2023-08-13 NOTE — Patient Outreach (Signed)
  Care Coordination   Initial Visit Note   08/13/2023 Name: Gail Lester MRN: [id number] DOB: 25-Feb-1941  Gail Lester is a 82 y.o. year old female who sees Sandford Craze, NP for primary care. I spoke with  Gail Lester by phone today.  What matters to the patients health and wellness today?  Gain a better understanding of resources available for transportation    Goals Addressed             This Visit's Progress    COMPLETED: Care Coordination Activities       Care Coordination Interventions: Outbound call placed to the patient to follow up on referral for transportation and in-home care. Patient reports she only needs assistance with transportation at this time Discussed the patient currently relies on her son for transportation. She has a neighbor who is willing to assist and just learned to use [employer] today. Patient feels [employer] Goad will be a good options for her Education provided on [employer] - patient is interested in reviewing this resource Provided the patient with educational material via e-mail Encouraged the patient to contact SW as needed         SDOH assessments and interventions completed:  Yes  SDOH Interventions Today    Flowsheet Row Most Recent Value  SDOH Interventions   Food Insecurity Interventions Intervention Not Indicated  Housing Interventions Intervention Not Indicated  Transportation Interventions Intervention Not Indicated, Other (Comment)  [Using family and Benedetto Goad,  education provided on [employer]]        Care Coordination Interventions:  Yes, provided  Interventions Today    Flowsheet Row Most Recent Value  Chronic Disease   Chronic disease during today's visit Diabetes, Other  [Transportation Resource Needs]  General Interventions   General Interventions Discussed/Reviewed General Interventions Discussed  Education Interventions   Education Provided Provided Education  Provided Verbal Education On [employer]  Access]          Follow up plan: No further intervention required.   Encounter Outcome:  Pt. Visit Completed   Bevelyn Ngo, BSW, CDP Social Worker, Certified Dementia Practitioner [facility] Care Management  Care Coordination [phone]

## 2023-08-14 ENCOUNTER — Ambulatory Visit (HOSPITAL_BASED_OUTPATIENT_CLINIC_OR_DEPARTMENT_OTHER): Payer: Medicare Other

## 2023-08-15 ENCOUNTER — Other Ambulatory Visit: Payer: Self-pay | Admitting: Cardiovascular Disease

## 2023-08-15 DIAGNOSIS — I10 Essential (primary) hypertension: Secondary | ICD-10-CM | POA: Diagnosis not present

## 2023-08-15 DIAGNOSIS — Z7984 Long term (current) use of oral hypoglycemic drugs: Secondary | ICD-10-CM | POA: Diagnosis not present

## 2023-08-15 DIAGNOSIS — E119 Type 2 diabetes mellitus without complications: Secondary | ICD-10-CM | POA: Diagnosis not present

## 2023-08-15 DIAGNOSIS — K219 Gastro-esophageal reflux disease without esophagitis: Secondary | ICD-10-CM | POA: Diagnosis not present

## 2023-08-15 DIAGNOSIS — E782 Mixed hyperlipidemia: Secondary | ICD-10-CM | POA: Diagnosis not present

## 2023-08-16 ENCOUNTER — Ambulatory Visit: Payer: Medicare Other

## 2023-08-16 ENCOUNTER — Ambulatory Visit: Payer: Medicare Other | Admitting: Physician Assistant

## 2023-08-16 ENCOUNTER — Encounter: Payer: Self-pay | Admitting: Physician Assistant

## 2023-08-16 VITALS — BP 119/55 | HR 79 | Resp 18 | Ht 63.0 in | Wt 124.0 lb

## 2023-08-16 DIAGNOSIS — Z7984 Long term (current) use of oral hypoglycemic drugs: Secondary | ICD-10-CM | POA: Diagnosis not present

## 2023-08-16 DIAGNOSIS — R55 Syncope and collapse: Secondary | ICD-10-CM

## 2023-08-16 DIAGNOSIS — R404 Transient alteration of awareness: Secondary | ICD-10-CM | POA: Diagnosis not present

## 2023-08-16 DIAGNOSIS — E782 Mixed hyperlipidemia: Secondary | ICD-10-CM | POA: Diagnosis not present

## 2023-08-16 DIAGNOSIS — I10 Essential (primary) hypertension: Secondary | ICD-10-CM | POA: Diagnosis not present

## 2023-08-16 DIAGNOSIS — R413 Other amnesia: Secondary | ICD-10-CM

## 2023-08-16 DIAGNOSIS — K219 Gastro-esophageal reflux disease without esophagitis: Secondary | ICD-10-CM | POA: Diagnosis not present

## 2023-08-16 DIAGNOSIS — E119 Type 2 diabetes mellitus without complications: Secondary | ICD-10-CM | POA: Diagnosis not present

## 2023-08-16 NOTE — Patient Instructions (Signed)
It was a pleasure to see you today at our office.   Recommendations:  Neurocognitive evaluation at our office   Follow up in 6 weeks EEG 48 h  Recommend follow up with cardiology  No driving for now    For psychiatric meds, mood meds: Please have your primary care physician manage these medications.  If you have any severe symptoms of a stroke, or other severe issues such as confusion,severe chills or fever, etc call 911 or go to the ER as you may need to be evaluated further  For guidance regarding [facility] and if placement were needed at the facility, contact Social Worker tel: [phone]  For assessment of decision of mental capacity and competency:  Call Dr. Erick Blinks, geriatric psychiatrist at [phone]  Counseling regarding caregiver distress, including caregiver depression, anxiety and issues regarding community resources, adult day care programs, adult living facilities, or memory care questions:  please contact your  Primary Doctor's Social Worker   Whom to call: Memory  decline, memory medications: Call our office [phone]    [email or web address]   RECOMMENDATIONS FOR ALL PATIENTS WITH MEMORY PROBLEMS: 1. Continue to exercise (Recommend 30 minutes of walking everyday, or 3 hours every week) 2. Increase social interactions - continue going to [place] and enjoy social gatherings with friends and family 3. Eat healthy, avoid fried foods and eat more fruits and vegetables 4. Maintain adequate blood pressure, blood sugar, and blood cholesterol level. Reducing the risk of stroke and cardiovascular disease also helps promoting better memory. 5. Avoid stressful situations. Live a simple life and avoid aggravations. Organize your time and prepare for the next day in anticipation. 6. Sleep well, avoid any interruptions of sleep and avoid any distractions in the bedroom that may interfere with  adequate sleep quality 7. Avoid sugar, avoid sweets as there is a strong link between excessive sugar intake, diabetes, and cognitive impairment We discussed the Mediterranean diet, which has been shown to help patients reduce the risk of progressive memory disorders and reduces cardiovascular risk. This includes eating fish, eat fruits and green leafy vegetables, nuts like almonds and hazelnuts, walnuts, and also use olive oil. Avoid fast foods and fried foods as much as possible. Avoid sweets and sugar as sugar use has been linked to worsening of memory function.  There is always a concern of gradual progression of memory problems. If this is the case, then we may need to adjust level of care according to patient needs. Support, both to the patient and caregiver, should then be put into place.      You have been referred for a neuropsychological evaluation (i.e., evaluation of memory and thinking abilities). Please bring someone with you to this appointment if possible, as it is helpful for the doctor to hear from both you and another adult who knows you well. Please bring eyeglasses and hearing aids if you wear them.    The evaluation will take approximately 3 hours and has two parts:   The first part is a clinical interview with the neuropsychologist (Dr. Milbert Coulter or Dr. Roseanne Reno). During the interview, the neuropsychologist will speak with you and the individual you brought to the appointment.    The second part of the evaluation is testing with the doctor's technician Annabelle Harman or Selena Batten). During the testing, the technician will ask you to remember different types of material, solve problems, and answer some questionnaires. Your family member will not be present for this portion of the evaluation.  Please note: We must reserve several hours of the neuropsychologist's time and the psychometrician's time for your evaluation appointment. As such, there is a No-Show fee of $100. If you are unable to attend  any of your appointments, please contact our office as soon as possible to reschedule.      DRIVING: Regarding driving, in patients with progressive memory problems, driving will be impaired. We advise to have someone else do the driving if trouble finding directions or if minor accidents are reported. Independent driving assessment is available to determine safety of driving.   If you are interested in the driving assessment, you can contact the following:  [facility] [employer] [facility] in [place] [phone]  [facility] [phone]  [facility] [phone]  [facility] [phone] or [phone]   FALL PRECAUTIONS: Be cautious when walking. Scan the area for obstacles that may increase the risk of trips and falls. When getting up in the mornings, sit up at the edge of the bed for a few minutes before getting out of bed. Consider elevating the bed at the head end to avoid drop of blood pressure when getting up. Walk always in a well-lit room (use night lights in the walls). Avoid area rugs or power cords from appliances in the middle of the walkways. Use a walker or a cane if necessary and consider physical therapy for balance exercise. Get your eyesight checked regularly.  FINANCIAL OVERSIGHT: Supervision, especially oversight when making financial decisions or transactions is also recommended.  HOME SAFETY: Consider the safety of the kitchen when operating appliances like stoves, microwave oven, and blender. Consider having supervision and share cooking responsibilities until no longer able to participate in those. Accidents with firearms and other hazards in the house should be identified and addressed as well.   ABILITY TO BE LEFT ALONE: If patient is unable to contact 911 operator, consider using LifeLine, or when the need is there, arrange for someone to stay with patients. Smoking is a fire hazard, consider supervision or cessation. Risk of  wandering should be assessed by caregiver and if detected at any point, supervision and safe proof recommendations should be instituted.  MEDICATION SUPERVISION: Inability to self-administer medication needs to be constantly addressed. Implement a mechanism to ensure safe administration of the medications.      Mediterranean Diet A Mediterranean diet refers to food and lifestyle choices that are based on the traditions of countries located on the [employer] [place]. This way of eating has been shown to help prevent certain conditions and improve outcomes for people who have chronic diseases, like kidney disease and heart disease. What are tips for following this plan? Lifestyle  Cook and eat meals together with your family, when possible. Drink enough fluid to keep your urine clear or pale yellow. Be physically active every day. This includes: Aerobic exercise like running or swimming. Leisure activities like gardening, walking, or housework. Get 7-8 hours of sleep each night. If recommended by your health care provider, drink red wine in moderation. This means 1 glass a day for nonpregnant women and 2 glasses a day for men. A glass of wine equals 5 oz (150 mL). Reading food labels  Check the serving size of packaged foods. For foods such as rice and pasta, the serving size refers to the amount of cooked product, not dry. Check the total fat in packaged foods. Avoid foods that have saturated fat or trans fats. Check the ingredients list for added sugars, such as corn syrup. Shopping  At the grocery store, buy most of your food from the areas near the walls of the store. This includes: Fresh fruits and vegetables (produce). Grains, beans, nuts, and seeds. Some of these may be available in unpackaged forms or large amounts (in bulk). Fresh seafood. Poultry and eggs. Low-fat dairy products. Buy whole ingredients instead of prepackaged foods. Buy fresh fruits and vegetables in-season from  local farmers markets. Buy frozen fruits and vegetables in resealable bags. If you do not have access to quality fresh seafood, buy precooked frozen shrimp or canned fish, such as tuna, salmon, or sardines. Buy small amounts of raw or cooked vegetables, salads, or olives from the deli or salad bar at your store. Stock your pantry so you always have certain foods on hand, such as olive oil, canned tuna, canned tomatoes, rice, pasta, and beans. Cooking  Cook foods with extra-virgin olive oil instead of using butter or other vegetable oils. Have meat as a side dish, and have vegetables or grains as your main dish. This means having meat in small portions or adding small amounts of meat to foods like pasta or stew. Use beans or vegetables instead of meat in common dishes like chili or lasagna. Experiment with different cooking methods. Try roasting or broiling vegetables instead of steaming or sauteing them. Add frozen vegetables to soups, stews, pasta, or rice. Add nuts or seeds for added healthy fat at each meal. You can add these to yogurt, salads, or vegetable dishes. Marinate fish or vegetables using olive oil, lemon juice, garlic, and fresh herbs. Meal planning  Plan to eat 1 vegetarian meal one day each week. Try to work up to 2 vegetarian meals, if possible. Eat seafood 2 or more times a week. Have healthy snacks readily available, such as: Vegetable sticks with hummus. Greek yogurt. Fruit and nut trail mix. Eat balanced meals throughout the week. This includes: Fruit: 2-3 servings a day Vegetables: 4-5 servings a day Low-fat dairy: 2 servings a day Fish, poultry, or lean meat: 1 serving a day Beans and legumes: 2 or more servings a week Nuts and seeds: 1-2 servings a day Whole grains: 6-8 servings a day Extra-virgin olive oil: 3-4 servings a day Limit red meat and sweets to only a few servings a month What are my food choices? Mediterranean diet Recommended Grains: Whole-grain  pasta. Brown rice. Bulgar wheat. Polenta. Couscous. Whole-wheat bread. Orpah Cobb. Vegetables: Artichokes. Beets. Broccoli. Cabbage. Carrots. Eggplant. Green beans. Chard. Kale. Spinach. Onions. Leeks. Peas. Squash. Tomatoes. Peppers. Radishes. Fruits: Apples. Apricots. Avocado. Berries. Bananas. Cherries. Dates. Figs. Grapes. Lemons. Melon. Oranges. Peaches. Plums. Pomegranate. Meats and other protein foods: Beans. Almonds. Sunflower seeds. Pine nuts. Peanuts. Cod. Salmon. Scallops. Shrimp. Tuna. Tilapia. Clams. Oysters. Eggs. Dairy: Low-fat milk. Cheese. Greek yogurt. Beverages: Water. Red wine. Herbal tea. Fats and oils: Extra virgin olive oil. Avocado oil. Grape seed oil. Sweets and desserts: [place] yogurt with honey. Baked apples. Poached pears. Trail mix. Seasoning and other foods: Basil. Cilantro. Coriander. Cumin. Mint. Parsley. Sage. Rosemary. Tarragon. Garlic. Oregano. Thyme. Pepper. Balsalmic vinegar. Tahini. Hummus. Tomato sauce. Olives. Mushrooms. Limit these Grains: Prepackaged pasta or rice dishes. Prepackaged cereal with added sugar. Vegetables: Deep fried potatoes (french fries). Fruits: Fruit canned in syrup. Meats and other protein foods: Beef. Pork. Lamb. Poultry with skin. Hot dogs. Tomasa Blase. Dairy: Ice cream. Sour cream. Whole milk. Beverages: Juice. Sugar-sweetened soft drinks. Beer. Liquor and spirits. Fats and oils: Butter. Canola oil. Vegetable oil. Beef fat (tallow). Lard. Sweets and desserts: Cookies.  Cakes. Pies. Candy. Seasoning and other foods: Mayonnaise. Premade sauces and marinades. The items listed may not be a complete list. Talk with your dietitian about what dietary choices are right for you. Summary The Mediterranean diet includes both food and lifestyle choices. Eat a variety of fresh fruits and vegetables, beans, nuts, seeds, and whole grains. Limit the amount of red meat and sweets that you eat. Talk with your health care provider about whether it is  safe for you to drink red wine in moderation. This means 1 glass a day for nonpregnant women and 2 glasses a day for men. A glass of wine equals 5 oz (150 mL). This information is not intended to replace advice given to you by your health care provider. Make sure you discuss any questions you have with your health care provider. Document Released: 08/02/2016 Document Revised: 09/04/2016 Document Reviewed: 08/02/2016 Elsevier Interactive Patient Education  2017 [employer].

## 2023-08-16 NOTE — Progress Notes (Addendum)
Assessment/Plan:   Gail Lester is a very pleasant 82 y.o. 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 She has had since July 3 unwitnessed episodes of syncope, last in 07/2023 with presentation to the ED. Neurological evaluation at the hospital and workup was negative for stroke and seizure. However, she is suspicious that she may have had more of these event, but "cannot recall". Neurologist at the hospital raised suspicion for early dementia as one of the differential  causes. She did have a Neuropsych evaluation in 10/2020 here, with a diagnosis of MCI, but not suspicious for  neurodegenerative disease such as AD, LBD, or FTD-she does not recall having this testing done-. 24-hour EEG in the hospital was within normal limits, there were note of sharp transients in the left temporal region, and also some limitation due to electrode artifact. MoCA today is 22/30. 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 Recent CT of the head on 07/19/2023 ordered after the patient's fall and possible loss of consciousness, was negative for acute intracranial abnormalities, mild atrophy was seen.  Memory Impairment  Patient has been Scheduled for MRI brain without contrast on August 14, 2023 by PCP  to assess for underlying structural abnormality and assess vascular load  Neurocognitive testing to further evaluate cognitive concerns and determine other underlying cause of memory changes, including potential contribution from sleep, anxiety, or depression. Patient is moving to [place] later this year, hopefully we can have this testing done, otherwise she has been instructed to establish care there.  Follow-up on results of carotid artery ultrasound (ordered by PCP). She has an appt with Cardiology for full  syncopal and presyncopal workup next week (Dr. Wyline Mood).   Ambulatory 48 EEG will be ordered for further  evaluation Continue to control mood as per PCP Recommend good control of cardiovascular risk factors Folllow up in  6 weeks  Subjective:   The patient is accompanied by her neighbor who supplements the history.    How long did patient have memory difficulties? "I was fine until I fell hit the head on the back of the head" with LOC. Her neighbor agrees that she was not showing any memory deficiencies till then. She enjoys writing, especially her diary. repeats oneself?  Endorsed since then  Disoriented when walking into a room?  Patient denies but friend reports that she has had episodes of not recognizing her house following the hospital visit.  Leaving objects in unusual places? denies   Wandering behavior?  denies   Any personality changes?  Patient denies   Any history of depression?:  Patient denies   Hallucinations or paranoia?  Patient denies   Seizures?   Patient denies any muscle pain, tongue biting or bleeding, mouth foam, postictal sensation with these events.     Any sleep changes? Sleeps well. Denies  vivid dreams, REM behavior or sleepwalking   Sleep apnea?  Patient denies   Any hygiene concerns?  Patient denies   Independent of bathing and dressing?  Endorsed  Does the patient needs help with medications? Patient is in charge   Who is in charge of the finances? Patient is in charge     Any changes in appetite?  denies, but has been experiencing a  25 pound weight loss, unintentional.  Being followed by PCP.  She does not drink enough water but trying to push hot water .    Patient have  trouble swallowing? denies   Does the patient cook? Not anymore.  Any kitchen accidents such as leaving the stove on? denies   Any headaches? She left something on the stove a while ago, neighbor say  Chronic back pain ? denies   Ambulates with difficulty?  denies   Recent falls or head injuries?  Around 4 July, the patient had a fall, although she does not remember doing so.  She reports  that she could have fallen in her bathroom, and then got herself to her bed. She had a contusion at the right side of her head.  She was confused for about 3-4 days, and then went back to "close to normal, manage day by day " . Vision changes? denies   Unilateral weakness, numbness or tingling? denies   Any tremors?   denies   Any anosmia?  denies   Any incontinence of urine? denies   Any bowel dysfunction? denies      Patient lives alone, moving to [place] in October to be with near her son. History of heavy alcohol intake? denies   History of heavy tobacco use? denies   Family history of dementia? denies  Does patient drive?  "Not now".     Pertinent labs July 2024 A1c 6.3, TSH 2.38, vitamin D normal, magnesium 1.8, normal lipid panel  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   Epidermal cyst 01/21/2017   Fibrocystic breast 02/10/2013   Left h/o FN biopsy    Ganglion cyst 11/14/2014   Right 2nd finger   GERD (gastroesophageal reflux disease) 10/09/2020   Hemangioma 09/28/2014   History of cataracts    Previous surgery to remove bilaterally   History of chicken pox    as a child   History of colonic polyp 11/21/2017   History of measles    as a child   History of mumps    as a child   Hypercholesteremia    Hyperlipidemia, mixed 02/10/2013   Melanocytic nevi, unspecified 09/28/2014   Mild neurocognitive disorder, unclear etiology 11/21/2020   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 08/14/2015   Onychomycosis 02/24/2017   Osteopenia 05/23/2017   Seborrheic keratosis 09/28/2014   Type 2 diabetes mellitus 02/10/2013     Past Surgical History:  Procedure Laterality Date   APPENDECTOMY  1955   BREAST BIOPSY  1985-1990   benign   EYE SURGERY Right 04/2019   cataract removal   EYE SURGERY Left 03/2020   cataract removal   ORIF WRIST FRACTURE  12/09/2011   Procedure: OPEN REDUCTION INTERNAL FIXATION  (ORIF) WRIST FRACTURE;  Surgeon: Sharma Covert;  Location: [facility] OR;  Service: Orthopedics;  Laterality: Left;     No Known Allergies  Current Outpatient Medications  Medication Instructions   Accu-Chek Softclix Lancets lancets USE AS DIRECTED   Apoaequorin (PREVAGEN PO) 1 tablet, Oral, Daily   ascorbic acid (VITAMIN C) 500 mg, Oral, Daily   aspirin 81 mg, Oral, Daily   Blood Glucose Monitoring Suppl (BLOOD GLUCOSE MONITOR SYSTEM) w/Device KIT Use in the morning, at noon, and at bedtime.   CALCIUM PO 630 mg, Oral, Daily, Taking 2 tablets of calcium citrate 315 each - 630mg  total   Cholecalciferol (VITAMIN D3) 50 MCG (2000 UT) TABS 1 tablet, Oral, Daily   Co-Enzyme Q10 100 MG CAPS 1 capsule, Oral, Daily   glimepiride (AMARYL) 1 mg, Oral, Daily with  breakfast   glucosamine-chondroitin 500-400 MG tablet 1 tablet, Oral, Daily   Glucose Blood (BLOOD GLUCOSE TEST STRIPS) STRP Use in the morning, at noon, and at bedtime.   Iron-Vitamin C (IRON 100/C PO) 1 tablet, Oral, Daily   Krill Oil 500 MG CAPS 1 capsule, Oral, Daily   Lancet Device MISC Use in the morning, at noon, and at bedtime. May substitute to any manufacturer covered by patient's insurance.   Lancets Misc. (ACCU-CHEK FASTCLIX LANCET) KIT Use to check sugar 4 times a day.  Dx code: E11.9   LUTEIN PO 25 mg, Oral, Daily   metFORMIN (GLUCOPHAGE-XR) 500 mg, Oral, 3 times daily   Multiple Vitamin (MULTIVITAMIN) tablet 1 tablet, Oral, Daily   rosuvastatin (CRESTOR) 20 mg, Oral, Daily   TRUEplus Lancets 28G MISC Use 1 lancet in the morning, at noon, and at bedtime.     VITALS:   Vitals:   08/16/23 0751  BP: (!) 119/55  Pulse: 79  Resp: 18  SpO2: 99%  Weight: 124 lb (56.2 kg)  Height: 5\' 3"  (1.6 m)      PHYSICAL EXAM   HEENT:  Normocephalic, atraumatic. The superficial temporal arteries are without ropiness or tenderness. Cardiovascular: Regular rate and rhythm.2/6 SM Lungs: Clear to auscultation bilaterally. Neck: There are  R>L harsh carotid bruits noted bilaterally.  NEUROLOGICAL:    08/16/2023    9:00 AM  Montreal Cognitive Assessment   Visuospatial/ Executive (0/5) 4  Naming (0/3) 3  Attention: Read list of digits (0/2) 2  Attention: Read list of letters (0/1) 1  Attention: Serial 7 subtraction starting at 100 (0/3) 3  Language: Repeat phrase (0/2) 1  Language : Fluency (0/1) 1  Abstraction (0/2) 2  Delayed Recall (0/5) 0  Orientation (0/6) 5  Total 22  Adjusted Score (based on education) 22       11/21/2017    9:02 AM 11/09/2016    3:10 PM  MMSE - Mini Mental State Exam  Orientation to time 5 5  Orientation to Place 5 5  Registration 3 3  Attention/ Calculation 5 5  Recall 3 3  Language- name 2 objects 2 2  Language- repeat 1 1  Language- follow 3 step command 3 3  Language- read & follow direction 1 1  Write a sentence 1 1  Copy design 1 1  Total score 30 30     Orientation:  Alert and oriented to person, not to place but to time. No aphasia or dysarthria. Fund of knowledge is appropriate. Recent and remote memory impaired  Attention and concentration are normal.  Able to name objects and repeat phrases. Delayed recall 0/5 Cranial nerves: There is good facial symmetry. Extraocular muscles are intact and visual fields are full to confrontational testing. Speech is fluent and clear. No tongue deviation. Hearing is intact to conversational tone. Tone: Tone is good throughout. Sensation: Sensation is intact to light touch. Vibration is intact at the bilateral big toe.  Coordination: The patient has no difficulty with RAM's or FNF bilaterally. Normal finger to nose  Motor: Strength is 5/5 in the bilateral upper and lower extremities. There is no pronator drift. There are no fasciculations noted. DTR's: Deep tendon reflexes are 2/4 bilaterally. Gait and Station: The patient is able to ambulate without difficulty.The patient is able to heel toe walk without any difficulty. Gait is cautious and  narrow. The patient is able to ambulate in a tandem fashion.       Thank you for allowing  [place] the opportunity to participate in the care of this nice patient. Please do not hesitate to contact us for any questions or concerns.   Total time spent on today's visit was 60 minutes dedicated to this patient today, preparing to see patient, examining the patient, ordering tests and/or medications and counseling the patient, documenting clinical information in the EHR or other health record, independently interpreting results and communicating results to the patient/family, discussing treatment and goals, answering patient's questions and coordinating care.  Cc:  Sandford Craze, NP  Marlowe Kays 08/16/2023 9:49 AM

## 2023-08-18 ENCOUNTER — Telehealth: Payer: Self-pay | Admitting: Family

## 2023-08-18 DIAGNOSIS — R918 Other nonspecific abnormal finding of lung field: Secondary | ICD-10-CM

## 2023-08-18 HISTORY — DX: Other nonspecific abnormal finding of lung field: R91.8

## 2023-08-18 NOTE — Telephone Encounter (Signed)
Please advise patient that pt that her CT scan of her Chest/abdomen and pelvis did not show any obvious cause for her weight los.    There were some small nodules in her lung that do not appear worrisome, but we should repeat her CT scan in 12 months to make sure that they are unchanged.

## 2023-08-19 DIAGNOSIS — E119 Type 2 diabetes mellitus without complications: Secondary | ICD-10-CM | POA: Diagnosis not present

## 2023-08-19 DIAGNOSIS — Z7984 Long term (current) use of oral hypoglycemic drugs: Secondary | ICD-10-CM | POA: Diagnosis not present

## 2023-08-19 DIAGNOSIS — K219 Gastro-esophageal reflux disease without esophagitis: Secondary | ICD-10-CM | POA: Diagnosis not present

## 2023-08-19 DIAGNOSIS — E782 Mixed hyperlipidemia: Secondary | ICD-10-CM | POA: Diagnosis not present

## 2023-08-19 DIAGNOSIS — I10 Essential (primary) hypertension: Secondary | ICD-10-CM | POA: Diagnosis not present

## 2023-08-19 NOTE — Telephone Encounter (Signed)
Called patient but no answer, left voice mail for patient to call back.   

## 2023-08-20 ENCOUNTER — Telehealth: Payer: Self-pay

## 2023-08-20 NOTE — Patient Outreach (Signed)
  Care Coordination   08/20/2023 Name: SHAILI PARKS MRN: [id number] DOB: 1941/02/18   Care Coordination Outreach Attempts:  An unsuccessful telephone outreach was attempted for a scheduled appointment today.  Follow Up Plan:  Additional outreach attempts will be made to offer the patient care coordination information and services.   Encounter Outcome:  No Answer   Care Coordination Interventions:  No, not indicated    Kathyrn Sheriff, RN, MSN, BSN, CCM Care Management Coordinator [phone]

## 2023-08-20 NOTE — Telephone Encounter (Signed)
Patient notified of results and and recomendations

## 2023-08-27 DIAGNOSIS — E782 Mixed hyperlipidemia: Secondary | ICD-10-CM | POA: Diagnosis not present

## 2023-08-27 DIAGNOSIS — I10 Essential (primary) hypertension: Secondary | ICD-10-CM | POA: Diagnosis not present

## 2023-08-27 DIAGNOSIS — E119 Type 2 diabetes mellitus without complications: Secondary | ICD-10-CM | POA: Diagnosis not present

## 2023-08-27 DIAGNOSIS — Z7984 Long term (current) use of oral hypoglycemic drugs: Secondary | ICD-10-CM | POA: Diagnosis not present

## 2023-08-27 DIAGNOSIS — K219 Gastro-esophageal reflux disease without esophagitis: Secondary | ICD-10-CM | POA: Diagnosis not present

## 2023-08-29 ENCOUNTER — Encounter: Payer: Self-pay | Admitting: Psychology

## 2023-08-29 ENCOUNTER — Ambulatory Visit: Payer: Medicare Other | Admitting: Psychology

## 2023-08-29 ENCOUNTER — Ambulatory Visit: Payer: Medicare Other

## 2023-08-29 DIAGNOSIS — G3184 Mild cognitive impairment, so stated: Secondary | ICD-10-CM

## 2023-08-29 DIAGNOSIS — R4189 Other symptoms and signs involving cognitive functions and awareness: Secondary | ICD-10-CM

## 2023-08-29 NOTE — Progress Notes (Signed)
   Psychometrician Note   Cognitive testing was administered to Gail Lester by Shan Levans, B.S. (psychometrist) under the supervision of Dr. Newman Nickels, Ph.D., licensed psychologist on 08/29/2023. Gail Lester did not appear overtly distressed by the testing session per behavioral observation or responses across self-report questionnaires. Rest breaks were offered.    The battery of tests administered was selected by Dr. Newman Nickels, Ph.D. with consideration to Gail Lester's current level of functioning, the nature of her symptoms, emotional and behavioral responses during interview, level of literacy, observed level of motivation/effort, and the nature of the referral question. This battery was communicated to the psychometrist. Communication between Dr. Newman Nickels, Ph.D. and the psychometrist was ongoing throughout the evaluation and Dr. Newman Nickels, Ph.D. was immediately accessible at all times. Dr. Newman Nickels, Ph.D. provided supervision to the psychometrist on the date of this service to the extent necessary to assure the quality of all services provided.    Gail Lester will return within approximately 1-2 weeks for an interactive feedback session with Dr. Milbert Coulter at which time her test performances, clinical impressions, and treatment recommendations will be reviewed in detail. Gail Lester understands she can contact our office should she require our assistance before this time.  A total of 125 minutes of billable time were spent face-to-face with Gail Lester by the psychometrist. This includes both test administration and scoring time. Billing for these services is reflected in the clinical report generated by Dr. Newman Nickels, Ph.D.  This note reflects time spent with the psychometrician and does not include test scores or any clinical interpretations made by Dr. Milbert Coulter. The full report will follow in a separate note.

## 2023-08-29 NOTE — Progress Notes (Addendum)
NEUROPSYCHOLOGICAL EVALUATION [place] Department of Neurology  Date of Evaluation: August 29, 2023  Reason for Referral:   Gail Lester is a 82 y.o. 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   Assessment and Plan:   Clinical Impression(s): Gail Lester pattern of performance is suggestive of severe impairment surrounding all aspects of verbal learning and memory. An additional relative weakness was exhibited across aspects of visual learning and memory. Performances were appropriate relative to age-matched peers across all other assessed cognitive domains. This includes processing speed, attention/concentration, executive functioning, safety/judgment, receptive and expressive language, and visuospatial abilities. Gail Lester largely denied difficulties completing instrumental activities of daily living (ADLs) independently outside of unspecified individuals "taking" her drivers license from her in the past due to suspected concerns. 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  Relative to her previous evaluation in November 2021, decline was exhibited across all aspects of learning and memory. This was most notable across verbal memory retention rates. Where previous rates ranged from 35% to 150%, she was fully amnestic (i.e., 0% retention) across all tasks currently. Outside of memory, performances across all other assessed domains exhibited stability.   Regarding etiology, I do have concern surrounding an underlying neurodegenerative illness, namely early stages of Alzheimer's disease. Despite some ability to learn novel  information across initial learning trials, she was fully amnestic (i.e., 0% retention) after brief delays across all verbal memory measures and only exhibited 50% retention across a single visual memory task. She also performed poorly across yes/no recognition trials. 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 Further evidence for progressive memory decline since 2021 also elevates concerns. It is encouraging that non-memory areas continue to exhibit strength. This would suggest that this illness would remain in earlier stages at the present time.   While Gail Lester described memory concerns stemming from her syncopal episodes this past July/August, she reported ongoing memory decline and rapid forgetting concerns being present for the prior 6-12 months during her 2021 evaluation. While recent syncopal experiences may have exacerbated dysfunction, it remains quite unlikely that these would represent the cause for progressive cognitive decline. This is especially true given fully unremarkable neuroimaging. Continued medical monitoring will be important moving forward.   Recommendations: I would recommend that Gail Lester discuss medication aimed to address memory loss and concerns surrounding Alzheimer's disease with Gail Lester. As she is moving to [place], [place] in the next few months, it will be very important for her to establish care with a new neurologist following her move so that memory medications can continue to be prescribed and monitored as necessary. While she should go through her insurance to find an appropriate provider, she could consider establishing care with the clinicians at the neurology clinic affiliated with [facility], [address] It is important to highlight that these medications have been shown to slow functional decline in  some individuals. There is no current treatment which can stop or reverse cognitive decline when caused by a neurodegenerative illness.   A repeat neuropsychological evaluation in 18-24 months (or sooner if functional decline is noted) is recommended  to assess the trajectory of future cognitive decline should it occur. This will also aid in future efforts towards improved diagnostic clarity.  Performance across neurocognitive testing is not a strong predictor of an individual's safety operating a motor vehicle. Should her family wish to pursue a formalized driving evaluation prior to her move to [place], they could reach out to the following agencies: [facility] [employer] [facility] in [place]: [phone] [facility]: [phone] [facility]: [phone] [facility]: [phone] or [phone]  Should there be progression of current deficits over time, Gail Lester is unlikely to regain any independent living skills lost. Therefore, it is recommended that she remain as involved as possible in all aspects of household chores, finances, and medication management, with supervision to ensure adequate performance. She will likely benefit from the establishment and maintenance of a routine in order to maximize her functional abilities over time.  It will be important for Gail Lester to have another person with her when in situations where she may need to process information, weigh the pros and cons of different options, and make decisions, in order to ensure that she fully understands and recalls all information to be considered.  If not already done, Gail Lester and her family may want to discuss her wishes regarding durable power of attorney and medical decision making, so that she can have input into these choices. If they require legal assistance with this, long-term care resource access, or other aspects of estate planning, they could reach out locally to [facility] at [phone] for a free consultation. Additionally, they may wish to discuss future plans for caretaking and seek out community options for in home/residential care should they become necessary.  Gail Lester is encouraged to attend to lifestyle factors for brain health (e.g., regular physical exercise, good nutrition habits and consideration of the MIND-DASH diet, regular participation in cognitively-stimulating activities, and general stress management techniques), which are likely to have benefits for both emotional adjustment and cognition. Optimal control of vascular risk factors (including safe cardiovascular exercise and adherence to dietary recommendations) is encouraged. Continued participation in activities which provide mental stimulation and social interaction is also recommended.   Important information should be provided to Gail Lester in written format in all instances. This information should be placed in a highly frequented and easily visible location within her home to promote recall. External strategies such as written notes in a consistently used memory journal, visual and nonverbal auditory cues such as a calendar on the refrigerator or appointments with alarm, such as on a cell phone, can also help maximize recall.  Review of Records:   Gail Lester was seen by [facility] Danise Edge, M.D.) on 10/06/2020 for her annual exam. Gail Lester was noted to have been eating well and staying active. She has a history of type 2 diabetes and her sugars had been improving. She did note a recent increase in indigestion with the majority of symptoms lasting about four days. She also noted some discomfort in her chest which was resolved with Tums. She denied any other acute concerns and reported taking her medications as prescribed. Dr. Mariel Aloe notes mention a history of mild cognitive impairment; however, no further details are provided. Ultimately, Gail Lester was referred for a  comprehensive neuropsychological evaluation to characterize her cognitive abilities and to assist with diagnostic clarity and treatment planning.   She completed a comprehensive neuropsychological evaluation with myself on 11/21/2020. Results suggested an isolated impairment across encoding (i.e., learning)  and retrieval aspects of both verbal and visual memory. Performances across yes/no recognition tasks were variable, but largely appropriate. She also exhibited an isolated weakness across a computerized task assessing hypothesis testing and adaptability; however, performance across all other executive functioning measures was strong. Performance was further appropriate across processing speed, attention/concentration, receptive and expressive language, and visuospatial abilities. No ADL dysfunction was reported and she was diagnosed with a mild neurocognitive disorder. The etiology was unclear at that time. Gail Lester did not display prominent Alzheimer's disease patterns at that time; however, there was some weakness and the very early stages of that illness could not be ruled out. Repeat testing was recommended.  Gail Lester was admitted to the ED on 08/01/2023 with complaints of recurrent episodes of confusion and falls with no recollection of these events. Concern for underlying seizure activity was expressed; however, neuroimaging and EEG studies were unremarkable and no outward epileptic signs were reported. CBGs were low during her most recent episode and there was concern that hypoglycemia represented the primary culprit for syncopal episodes and intermittent confusion. She was discharged home on 08/04/2023.   She was seen by [facility] Marlowe Kays, PA-C) on 08/16/2023 for an evaluation of memory loss. Briefly, she was said to have experienced three unwitnessed episodes of syncope in July and August 2024. Her most recent episode and subsequent ED visit is briefly described above. Gail Lester  described a more sharp decline in memory abilities following her syncopal events. Her neighbor and friend agreed that prior to these falls, memory concerns were minimal. No ADL dysfunction was noted. Performance on a brief cognitive screening instrument (MOCA) was 22/30. Ultimately, Ms. Baisch was referred for a repeat neuropsychological evaluation to characterize her cognitive abilities and to assist with diagnostic clarity and treatment planning.   Neuroimaging A head CT while admitted to the ED for a prior fall was negative. A 24-hour EEG while admitted was also unremarkable. Brain MRI on 08/01/2023 was unremarkable outside of a small right parietal scalp contusion.  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   Dysplastic nevus 10/11/2014   Epidermal cyst 01/21/2017   Fall 07/02/2023   Fibrocystic breast 02/10/2013   Left h/o FN biopsy    Ganglion cyst 11/14/2014   Right 2nd finger   GERD (gastroesophageal reflux disease) 10/09/2020   Hemangioma 09/28/2014   History of cataracts    Previous surgery to remove bilaterally   History of chicken pox    as a child   History of colonic polyp 11/21/2017   History of measles    as a child   History of mumps    as a child   Hypercholesteremia    Hyperlipidemia, mixed 02/10/2013   Melanocytic nevi, unspecified 09/28/2014   Mild neurocognitive disorder, unclear etiology 11/21/2020   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 08/14/2015   Muscle cramps 04/24/2018   Neck pain 04/10/2021   Onychomycosis 02/24/2017   Osteopenia 05/23/2017   Osteoporosis 05/23/2017   Pulmonary nodules 08/18/2023   Recurrent falls 08/06/2023   Right knee pain 02/15/2017   Rotator cuff arthropathy 11/24/2014   Injected 11/24/14     Sacral pain  04/10/2021   Seborrheic keratosis 09/28/2014   Syncope and collapse 08/02/2023   Transient alteration of awareness 07/02/2023  Type 2 diabetes mellitus with hypoglycemia 02/10/2013   Saw Dr Emily Filbert, eye doctor in 01/01/13, exam in chart      Past Surgical History:  Procedure Laterality Date   APPENDECTOMY  1955   BREAST BIOPSY  1985-1990   benign   EYE SURGERY Right 04/2019   cataract removal   EYE SURGERY Left 03/2020   cataract removal   ORIF WRIST FRACTURE  12/09/2011   Procedure: OPEN REDUCTION INTERNAL FIXATION (ORIF) WRIST FRACTURE;  Surgeon: Sharma Covert;  Location: [facility] OR;  Service: Orthopedics;  Laterality: Left;   Current Outpatient Medications:    Accu-Chek Softclix Lancets lancets, USE AS DIRECTED, Disp: 300 each, Rfl: 2   Apoaequorin (PREVAGEN PO), Take 1 tablet by mouth daily., Disp: , Rfl:    aspirin 81 MG tablet, Take 81 mg by mouth daily., Disp: , Rfl:    Blood Glucose Monitoring Suppl (BLOOD GLUCOSE MONITOR SYSTEM) w/Device KIT, Use in the morning, at noon, and at bedtime., Disp: 1 kit, Rfl: 0   CALCIUM PO, Take 630 mg by mouth daily. Taking 2 tablets of calcium citrate 315 each - 630mg  total, Disp: , Rfl:    Cholecalciferol (VITAMIN D3) 50 MCG (2000 UT) TABS, Take 1 tablet by mouth daily., Disp: , Rfl:    Co-Enzyme Q10 100 MG CAPS, Take 1 capsule by mouth daily., Disp: , Rfl:    glimepiride (AMARYL) 1 MG tablet, Take 1 tablet (1 mg total) by mouth daily with breakfast. (Patient not taking: Reported on 08/06/2023), Disp: 30 tablet, Rfl: 0   glucosamine-chondroitin 500-400 MG tablet, Take 1 tablet by mouth daily., Disp: 90 tablet, Rfl: 2   Glucose Blood (BLOOD GLUCOSE TEST STRIPS) STRP, Use in the morning, at noon, and at bedtime., Disp: 100 strip, Rfl: 0   Iron-Vitamin C (IRON 100/C PO), Take 1 tablet by mouth daily., Disp: , Rfl:    Krill Oil 500 MG CAPS, Take 1 capsule by mouth daily., Disp: , Rfl:    Lancet Device MISC, Use in the morning, at noon, and at bedtime.  May substitute to any manufacturer covered by patient's insurance., Disp: 1 each, Rfl: 0   Lancets Misc. (ACCU-CHEK FASTCLIX LANCET) KIT, Use to check sugar 4 times a day.  Dx code: E11.9, Disp: 1 kit, Rfl: 0   LUTEIN PO, Take 25 mg by mouth daily., Disp: , Rfl:    metFORMIN (GLUCOPHAGE-XR) 500 MG 24 hr tablet, Take 1 tablet (500 mg total) by mouth in the morning, at noon, and at bedtime., Disp: 270 tablet, Rfl: 2   Multiple Vitamin (MULTIVITAMIN) tablet, Take 1 tablet by mouth daily. , Disp: , Rfl:    rosuvastatin (CRESTOR) 20 MG tablet, TAKE 1 TABLET BY MOUTH DAILY, Disp: 30 tablet, Rfl: 0   vitamin C (ASCORBIC ACID) 500 MG tablet, Take 500 mg by mouth daily., Disp: , Rfl:   Clinical Interview:   The following information was obtained during a clinical interview with Ms. Freida Busman and her neighbor/close friend prior to cognitive testing.  Cognitive Symptoms: Decreased short-term memory: Endorsed. She previously reported generalized symptoms of short-term memory loss. Specific examples included entering a room and forgetting her original intention, as well as occasional instances where she will misplace something or have trouble recalling the details of prior conversations. Currently, she described a somewhat sharp decline in memory abilities following her syncopal episodes this past July and August. She alluded to increased amnesia, especially surrounding the falls themselves. She was also noted to be somewhat repetitive during  the current interview.  Decreased long-term memory: Denied. Decreased attention/concentration: Denied. Reduced processing speed: Endorsed. This was previously endorsed and she reported persisting subjective concerns.  Difficulties with executive functions: Denied. Significant personality changes were likewise denied.  Difficulties with emotion regulation: Denied. Difficulties with receptive language: Denied assuming she can hear the source of the sound adequately. She did  describe a recent isolated instance where she exhibited confusion and had trouble comprehending instructions her son was providing surrounding accessing something on her computer. Difficulties with word finding: Endorsed. Previously, symptoms were mild and said to happen "now and then." She reported current symptoms occurring "occasionally." Decreased visuoperceptual ability: Denied.   Trajectory of deficits: At that time of her previous November 2021, evaluation, memory concerns were said to be present for the prior 6-12 months. They were said to have gradually worsened over time. As stated above, she reported a more sharp decline following her recent falls during the previous months.    Difficulties completing ADLs: Largely denied. She lives alone and continues to be independent with medication and financial management. She is in the process of moving to the greater [place] area, assumedly to be nearer to family. She no longer drives. She denied instances of getting lost or any accidents which precipitated this decision. She also reported "deep resentment" towards those who took her license as she did not feel this was necessary.   Additional Medical History: History of traumatic brain injury/concussion: Her first fall was said to occur on or very near to 06/27/2023. She was amnestic for the event and reported only becoming aware of the likelihood of  fall after observing bruising in the back of her head and along her back in the day or two afterwards while showering/looking in the mirror. Her neighbor reported additional confusional episodes and syncopal events in July and August, with the most recent resulting in a right parietal impact (Head CT was negative). Prior to July 2024, no prior falls or head injuries were described.  History of stroke: Denied. History of seizure activity: Denied. History of known exposure to toxins: Denied. Symptoms of chronic pain: Denied. Experience of frequent  headaches/migraines: Denied. She previously reported a remote history of frequent headaches but stated that these symptoms have significantly improved over the past 5-7 years.  Frequent instances of dizziness/vertigo: Denied. Infrequent occurrences were said to be related to blood sugar levels and history of diabetes.    Sensory changes: She reported ongoing hearing loss. A referral to an audiologist was recommended at the time of her previous evaluation; however, it was unclear if she followed through with this recommendation. She reported having bilateral cataract surgery in the 12 months prior to her 2021 evaluation and described her current vision as much improved. Other sensory changes/difficulties (e.g., taste or smell) were denied.  Balance/coordination difficulties: Endorsed. She previously described her balance as a "little shaky" at times. She had been previously referred to outpatient PT. However, these sessions were discontinued as Ms. Angrisani did not feel that her and her therapist were working on exercises which were beneficial to her functioning. Currently, she continued to describe feeling "shaky" while ambulating, noting that she is more "leery" and far more cautious when navigating steps. One side of the body was not said to be weaker or less stable relative to the other. Other motor difficulties: Denied.  Sleep History: Estimated hours obtained each night: 6 hours.  Difficulties falling asleep: Denied. Difficulties staying asleep: Denied. Feels rested and refreshed upon awakening: Endorsed.  History of snoring: Unknown as she lives alone. However, she previously reported occasionally waking up with a dry mouth and suspected that she may snore.  History of waking up gasping for air: Denied. Witnessed breath cessation while asleep: Denied.   History of vivid dreaming: Denied. Excessive movement while asleep: Denied. Instances of acting out her dreams:  Denied.  Psychiatric/Behavioral Health History: Depression: She described her current mood as "up and down," attributing this to ongoing frustration stemming from cognitive inefficiencies and concern surrounding recent medical events. She denied remote depressive symptoms or prior concerns surrounding a major depressive disorder. Current or remote suicidal ideation, intent, or plan was likewise denied.  Anxiety: She reported ongoing symptoms of generalized anxiety, likely related to frustration given factors described above. These represent a newer symptom and she denied a history of an anxiety disorder in the past.  Mania: Denied. Trauma History: Denied. Visual/auditory hallucinations: Denied. Delusional thoughts: Denied.   Tobacco: Denied. She reported quitting approximately 35+ years previously.  Alcohol: She denied current alcohol consumption as well as a history of problematic alcohol abuse or dependence.  Recreational drugs: Denied.  Family History: Problem Relation Age of Onset   Kidney disease Mother    Alcoholism Mother    Diabetes Father    Kidney disease Maternal Grandmother    Cancer Maternal Grandmother        bladder   Cancer Maternal Aunt    Alzheimer's disease Maternal Aunt    Scoliosis Son    Colon cancer Other        grandparent   This information was confirmed by Ms. Shealy.  Academic/Vocational History: Highest level of educational attainment: 13.5 years. She graduated from high school and completed an additional 1.5 years of college. She described herself as a good (A/[employer]) student in academic settings and ultimately left college due to financial limitations. English/writing-based courses were noted as relative strengths.  History of developmental delay: Denied. History of grade repetition: Denied. Enrollment in special education courses: Denied. History of LD/ADHD: Denied.   Employment: Semi-retired. She worked for an extended time in a [employer]. 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 This stoppage was likely a combination of her impending move and self-described cognitive concerns surrounding her ability to operative this booth any longer.  Evaluation Results:   Behavioral Observations: Ms. Knudson was accompanied by her neighbor/close friend, arrived to her appointment on time, and was appropriately dressed and groomed. She appeared alert and oriented. Observed gait and station were within normal limits. Gross motor functioning appeared intact upon informal observation and no abnormal movements (e.g., tremors) were noted. Her affect was generally relaxed and positive, but did range appropriately given the subject being discussed during the clinical interview or the task at hand during testing procedures. Spontaneous speech was fluent and word finding difficulties were not observed during the clinical interview. Thought processes were coherent, organized, and normal in content. Some repetition was exhibited during interview. She asked if her friend could accompany her in the testing room towards the end of the interview. It was explained that this was not allowed. She was heard again asking the psychometrist as she was leading Ms. Tuohey to the testing room if her friend could accompany her minutes later. Insight into her cognitive difficulties appeared fairly adequate.   During testing, sustained attention was appropriate. Task engagement was adequate and she persisted when challenged. Overall, Ms. Cassens was cooperative with the clinical interview and subsequent  testing procedures.   Adequacy of Effort: The validity of neuropsychological testing is limited by the extent to which the individual being tested may be assumed to have exerted adequate effort during testing. Ms. Walbert expressed her intention to perform to the best of her abilities and exhibited adequate task engagement and persistence. Scores across  stand-alone and embedded performance validity measures were within expectation. As such, the results of the current evaluation are believed to be a valid representation of Ms. Nieto's current cognitive functioning.  Test Results: Ms. Durnan was fully oriented at the time of the current evaluation.  Intellectual abilities based upon educational and vocational attainment were estimated to be in the average range. Premorbid abilities were estimated to be within the above average range based upon a single-word reading test.   Processing speed was average to well above average. Basic attention was exceptionally high. More complex attention (e.g., working memory) was well above average. Executive functioning was average to above average. Performance across a task assessing safety and judgment was exceptionally high.  Assessed receptive language abilities were above average. Likewise, Ms. Fleener did not exhibit any difficulties comprehending task instructions and answered all questions asked of her appropriately. Assessed expressive language (e.g., verbal fluency and confrontation naming) was average to well above average.     Assessed visuospatial/visuoconstructional abilities were average to above average. Points were lost on her drawing of a clock due to her placing four hands on the clock, two pointing to 12 and 4 and another set of swooping hands pointing from 11 to 2 in a semi-circular form.    Learning (i.e., encoding) of novel verbal and visual information was variable, ranging from the exceptionally low to average normative ranges. Spontaneous delayed recall (i.e., retrieval) of previously learned information was exceptionally low to below average. Retention rates were 0% across a story learning task, 0% across a list learning task, 0% across a daily living task, and 50% across a shape learning task. Performance across recognition tasks was poor, suggesting very limited evidence for information  consolidation.   Results of emotional screening instruments suggested that recent symptoms of generalized anxiety were in the minimal to mild range, while symptoms of depression were within normal limits. A screening instrument assessing recent sleep quality suggested the presence of minimal sleep dysfunction.  Tables of Scores:   Note: This summary of test scores accompanies the interpretive report and should not be considered in isolation without reference to the appropriate sections in the text. Descriptors are based on appropriate normative data and may be adjusted based on clinical judgment. Terms such as "Within Normal Limits" and "Outside Normal Limits" are used when a more specific description of the test score cannot be determined. Descriptors refer to the current evaluation only.         Percentile - Normative Descriptor > 98 - Exceptionally High 91-97 - Well Above Average 75-90 - Above Average 25-74 - Average 9-24 - Below Average 2-8 - Well Below Average < 2 - Exceptionally Low        Validity: November 2021 Current  DESCRIPTOR        DCT: --- --- --- Within Normal Limits  NAB EVI: --- --- --- Within Normal Limits        Orientation:       Raw Score Raw Score Percentile   NAB Orientation, Form 1 29/29 29/29 --- ---        Cognitive Screening:       Raw Score Raw Score Percentile  SLUMS: 24/30 23/30 --- ---        Intellectual Functioning:       Standard Score Standard Score Percentile   Test of Premorbid Functioning: 117 112 79 Above Average        Memory:      NAB Memory Module, Form 1: Standard Score/ T Score Standard Score/ T Score Percentile   Total Memory Index 76 70 2 Well Below Average  List Learning        Total Trials 1-3 18/36 (42) 17/36 (42) 21 Below Average    List Lester 4/12 (50) 2/12 (39) 14 Below Average    Short Delay Free Recall 2/12 (31) 3/12 (35) 7 Well Below Average    Long Delay Free Recall 3/12 (35) 0/12 (29) 2 Well Below Average    Retention  Percentage 150 (68) 0 (30) 2 Well Below Average    Recognition Discriminability 7 (49) 0 (35) 7 Well Below Average  Shape Learning        Total Trials 1-3 8/27 (31) 12/27 (46) 34 Average    Delayed Recall 4/9 (45) 3/9 (40) 16 Below Average    Retention Percentage 133 (61) 50 (38) 12 Below Average    Recognition Discriminability 7 (54) 3 (39) 14 Below Average  Story Learning        Immediate Recall 35/80 (38) 27/80 (26) 1 Exceptionally Low    Delayed Recall 12/40 (34) 0/40 (29) 2 Well Below Average    Retention Percentage 67 (40) 0 (19) <1 Exceptionally Low  Daily Living Memory        Immediate Recall 46/51 (60) 39/51 (47) 38 Average    Delayed Recall 6/17 (26) 0/17 (19) <1 Exceptionally Low    Retention Percentage 35 (19) 0 (<19) <1 Exceptionally Low    Recognition Hits 6/10 (32) 6/10 (36) 8 Well Below Average        Attention/Executive Function:      Trail Making Test (TMT): Raw Score (T Score) Raw Score (T Score) Percentile     Part A 38 secs.,  0 errors (50) 35 secs.,  0 errors (52) 58 Average    Part Lester 73 secs.,  0 errors (58) 99 secs.,  1 error (52) 58 Average          Scaled Score Scaled Score Percentile   WAIS-IV Coding: 13 12 75 Above Average        NAB Attention Module, Form 1: T Score T Score Percentile     Digits Forward 72 73 99 Exceptionally High    Digits Backwards 67 63 91 Well Above Average        D-KEFS Color-Word Interference Test: Raw Score (Scaled Score) Raw Score (Scaled Score) Percentile     Color Naming 22 secs. (15) 23 secs. (15) 95 Well Above Average    Word Reading 18 secs. (14) 19 secs. (14) 91 Well Above Average    Inhibition 62 secs. (12) 83 secs. (11) 63 Average      Total Errors 1 error (12) 2 errors (11) 63 Average    Inhibition/Switching 54 secs. (13) 71 secs. (13) 84 Above Average      Total Errors 4 errors (9) 3 errors (11) 63 Average        NAB Executive Functions Module, Form 1: T Score T Score Percentile     Judgment 63 71 98  Exceptionally High        Language:      Verbal Fluency Test: Raw Score (T  Score) Raw Score (T Score) Percentile     Phonemic Fluency (FAS) 40 (49) 37 (46) 34 Average    Animal Fluency 15 (45) 18 (51) 54 Average         NAB Language Module, Form 1: T Score T Score Percentile     Auditory Comprehension 58 58 79 Above Average    Naming 30/31 (60) 30/31 (63) 91 Well Above Average        Visuospatial/Visuoconstruction:       Raw Score Raw Score Percentile   Clock Drawing: 10/10 7/10 --- Within Normal Limits        NAB Spatial Module, Form 1: T Score T Score Percentile     Figure Drawing Copy 57 61 86 Above Average         Scaled Score Scaled Score Percentile   WAIS-IV Block Design: 10 12 75 Above Average        Mood and Personality:       Raw Score Raw Score Percentile   Geriatric Depression Scale: 6 6 --- Within Normal Limits  Geriatric Anxiety Scale: 11 11 --- Minimal    Somatic 2 2 --- Minimal    Cognitive 3 5 --- Mild    Affective 6 4 --- Mild        Additional Questionnaires:       Raw Score Raw Score Percentile   PROMIS Sleep Disturbance Questionnaire: 17 12 --- None to Slight   Informed Consent and Coding/Compliance:   The current evaluation represents a clinical evaluation for the purposes previously outlined by the referral source and is in no way reflective of a forensic evaluation.   Ms. Pendergast was provided with a verbal description of the nature and purpose of the present neuropsychological evaluation. Also reviewed were the foreseeable risks and/or discomforts and benefits of the procedure, limits of confidentiality, and mandatory reporting requirements of this provider. The patient was given the opportunity to ask questions and receive answers about the evaluation. Oral consent to participate was provided by the patient.   This evaluation was conducted by Newman Nickels, Ph.D., ABPP-CN, board certified clinical neuropsychologist. Ms. Hogenson completed a clinical  interview with Dr. Milbert Coulter, billed as one unit [phone], and 125 minutes of cognitive testing and scoring, billed as one unit [phone] and three additional units [id number]. Psychometrist Shan Levans, Lester.S. assisted Dr. Milbert Coulter with test administration and scoring procedures. As a separate and discrete service, one unit [id number] and two units [phone] were billed for Dr. [name] time spent in interpretation and report writing.

## 2023-08-30 ENCOUNTER — Ambulatory Visit: Payer: Medicare Other | Attending: Internal Medicine | Admitting: Internal Medicine

## 2023-08-30 ENCOUNTER — Ambulatory Visit (INDEPENDENT_AMBULATORY_CARE_PROVIDER_SITE_OTHER): Payer: Medicare Other

## 2023-08-30 VITALS — BP 120/64 | HR 70 | Ht 64.0 in | Wt 121.6 lb

## 2023-08-30 DIAGNOSIS — R072 Precordial pain: Secondary | ICD-10-CM

## 2023-08-30 DIAGNOSIS — R55 Syncope and collapse: Secondary | ICD-10-CM | POA: Diagnosis not present

## 2023-08-30 NOTE — Progress Notes (Unsigned)
Enrolled patient for a 7 day Zio XT monitor to be mailed to patients home.  

## 2023-08-30 NOTE — Patient Instructions (Signed)
Medication Instructions:  Your physician recommends that you continue on your current medications as directed. Please refer to the Current Medication list given to you today. *If you need a refill on your cardiac medications before your next appointment, please call your pharmacy*   Testing/Procedures: Cardiac Monitor - 7 days Your physician has recommended that you wear an event monitor. Event monitors are medical devices that record the heart's electrical activity. Doctors most often [place] these monitors to diagnose arrhythmias. Arrhythmias are problems with the speed or rhythm of the heartbeat. The monitor is a small, portable device. You can wear one while you do your normal daily activities. This is usually used to diagnose what is causing palpitations/syncope (passing out).    Follow-Up: At [facility], you and your health needs are our priority.  As part of our continuing mission to provide you with exceptional heart care, we have created designated Provider Care Teams.  These Care Teams include your primary Cardiologist (physician) and Advanced Practice Providers (APPs -  Physician Assistants and Nurse Practitioners) who all work together to provide you with the care you need, when you need it.  We recommend signing up for the patient portal called "MyChart".  Sign up information is provided on this After Visit Summary.  MyChart is used to connect with patients for Virtual Visits (Telemedicine).  Patients are able to view lab/test results, encounter notes, upcoming appointments, etc.  Non-urgent messages can be sent to your provider as well.   To learn more about what you can do with MyChart, go to [email or web address].    Your next appointment:   As Needed  Provider:   Dr Jaclyn Prime- Long Term Monitor Instructions  Your physician has requested you wear a ZIO patch monitor for 7 days.  This is a single patch monitor. [employer] supplies one patch monitor per  enrollment. Additional stickers are not available. Please do not apply patch if you will be having a Nuclear Stress Test,  Echocardiogram, Cardiac CT, MRI, or Chest Xray during the period you would be wearing the  monitor. The patch cannot be worn during these tests. You cannot remove and re-apply the  ZIO XT patch monitor.  Your ZIO patch monitor will be mailed 3 day USPS to your address on file. It may take 3-5 days  to receive your monitor after you have been enrolled.  Once you have received your monitor, please review the enclosed instructions. Your monitor  has already been registered assigning a specific monitor serial # to you.  Billing and Patient Assistance Program Information  We have supplied [employer] with any of your insurance information on file for billing purposes. [employer] offers a sliding scale Patient Assistance Program for patients that do not have  insurance, or whose insurance does not completely cover the cost of the ZIO monitor.  You must apply for the Patient Assistance Program to qualify for this discounted rate.  To apply, please call [employer] at [phone], select option 4, select option 2, ask to apply for  Patient Assistance Program. Meredeth Ide will ask your household income, and how many people  are in your household. They will quote your out-of-pocket cost based on that information.  [employer] will also be able to set up a 68-month, interest-free payment plan if needed.  Applying the monitor   Shave hair from upper left chest.  Hold abrader disc by orange tab. Rub abrader in 40 strokes over the upper left chest as  indicated in your  monitor instructions.  Clean area with 4 enclosed alcohol pads. Let dry.  Apply patch as indicated in monitor instructions. Patch will be placed under collarbone on left  side of chest with arrow pointing upward.  Rub patch adhesive wings for 2 minutes. Remove white label marked "1". Remove the white  label marked "2". Rub patch  adhesive wings for 2 additional minutes.  While looking in a mirror, press and release button in center of patch. A small green light will  flash 3-4 times. This will be your only indicator that the monitor has been turned on.  Do not shower for the first 24 hours. You may shower after the first 24 hours.  Press the button if you feel a symptom. You will hear a small click. Record Date, Time and  Symptom in the Patient Logbook.  When you are ready to remove the patch, follow instructions on the last 2 pages of Patient  Logbook. Stick patch monitor onto the last page of Patient Logbook.  Place Patient Logbook in the blue and white box. Use locking tab on box and tape box closed  securely. The blue and white box has prepaid postage on it. Please place it in the mailbox as  soon as possible. Your physician should have your test results approximately 7 days after the  monitor has been mailed back to [facility].  Call [facility] Customer Care at [phone] if you have questions regarding  your ZIO XT patch monitor. Call them immediately if you see an orange light blinking on your  monitor.  If your monitor falls off in less than 4 days, contact our Monitor department at [phone].  If your monitor becomes loose or falls off after 4 days call [employer] at [phone] for  suggestions on securing your monitor

## 2023-08-30 NOTE — Progress Notes (Addendum)
  Cardiology Office Note:  .   Date:  08/30/2023  ID:  [name], DOB [birth year], MRN [id number] PCP: Sandford Craze, NP  [facility] Providers Cardiologist:  None    History of Present Illness: .   Gail Lester is a 82 y.o. female with no significant pmhx. No prior cardiac disease or work up. Referral sent for LOC/delerium.  She presents with a series of falls over the past month. The patient does not recall much about the episodes, but a family member reports that the first fall resulted in a concussion and significant bruising. The patient was disoriented after the first fall and may have been unconscious for up to 24 hours. The patient does not recall feeling lightheaded or dizzy before the falls. The patient also reports unintentional weight loss of approximately 30 pounds over the past six months due to decreased appetite. Patient notes her blood gluocose at one point went to down to 20. The patient denies any other symptoms.  No daily symptoms of LH, SOB or chest pain  ROS:  per HPI otherwise negative   Studies Reviewed: Marland Kitchen   EKG Interpretation Date/Time:  Friday August 30 2023 09:33:02 EDT Ventricular Rate:  70 PR Interval:  166 QRS Duration:  112 QT Interval:  404 QTC Calculation: 436 R Axis:   26  Text Interpretation: Normal sinus rhythm Minimal voltage criteria for LVH, may be normal variant ( Cornell product ) Cannot rule out Anterior infarct , age undetermined When compared with ECG of 01-Aug-2023 13:23, PREVIOUS ECG IS PRESENT Confirmed by Carolan Clines ([id number]) on 08/30/2023 9:40:00 AM     Risk Assessment/Calculations:        Physical Exam:   VS:  BP 120/64 (BP Location: Left Arm, Patient Position: Sitting, Cuff Size: Normal)   Pulse 70   Ht 5\' 4"  (1.626 m)   Wt 121 lb 9.6 oz (55.2 kg)   SpO2 97%   BMI 20.87 kg/m    Wt Readings from Last 3 Encounters:  08/30/23 121 lb 9.6 oz (55.2 kg)  08/16/23 124 lb (56.2 kg)  08/06/23 125 lb (56.7 kg)    GEN:  Well nourished, well developed in no acute distress NECK: No JVD; No carotid bruits CARDIAC: RRR, no murmurs, rubs, gallops RESPIRATORY:  Clear to auscultation without rales, wheezing or rhonchi  ABDOMEN: Soft, non-tender, non-distended EXTREMITIES:  No edema; No deformity   ASSESSMENT AND PLAN: .   Syncope   - story sounds more like delerium. UTI? Unclear. Her echo was normal, no valve disease. She has no conduction disease on her EKG. She had a CAC that was low; low suspicion for coronary disease. She seems to be doing better now. -will plan to assess her rhythm; if this is unremarkable; do not anticipate further cardiac w/u  7 day ziopatch  Dispo: Follow up PRN. She is moving to [employer], [place], Alben Spittle, MD

## 2023-09-02 ENCOUNTER — Other Ambulatory Visit: Payer: Self-pay | Admitting: Family Medicine

## 2023-09-02 DIAGNOSIS — M25561 Pain in right knee: Secondary | ICD-10-CM

## 2023-09-03 ENCOUNTER — Ambulatory Visit: Payer: Medicare Other | Admitting: Psychology

## 2023-09-03 DIAGNOSIS — G3184 Mild cognitive impairment, so stated: Secondary | ICD-10-CM | POA: Diagnosis not present

## 2023-09-03 DIAGNOSIS — R55 Syncope and collapse: Secondary | ICD-10-CM

## 2023-09-03 NOTE — Progress Notes (Signed)
   Neuropsychology Feedback Session Gail Lester. [facility] Department of Neurology  Reason for Referral:   [name] is a 82 y.o. 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   Feedback:   [name] completed a comprehensive neuropsychological evaluation on 08/29/2023. Please refer to that encounter for the full report and recommendations. Briefly, results suggested severe impairment surrounding all aspects of verbal learning and memory. An additional relative weakness was exhibited across aspects of visual learning and memory. Performances were appropriate relative to age-matched peers across all other assessed cognitive domains. Relative to her previous evaluation in November 2021, decline was exhibited across all aspects of learning and memory. This was most notable across verbal memory retention rates. Where previous rates ranged from 35% to 150%, she was fully amnestic (i.e., 0% retention) across all tasks currently. Outside of memory, performances across all other assessed domains exhibited stability. Regarding etiology, I do have concern surrounding an underlying neurodegenerative illness, namely early stages of Alzheimer's disease. Despite some ability to learn novel information across initial learning trials, she was fully amnestic (i.e., 0% retention) after brief delays across all verbal memory measures and only exhibited 50% retention across a single visual memory task. She also performed poorly across yes/no recognition trials. 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 Further evidence for progressive memory  decline since 2021 also elevates concerns. It is encouraging that non-memory areas continue to exhibit strength. This would suggest that this illness would remain in earlier stages at the present time.   [name] was accompanied by her close friend during the current feedback session. Content of the current session focused on the results of her neuropsychological evaluation. [name] was given the opportunity to ask questions and her questions were answered. She was encouraged to reach out should additional questions arise. A copy of her report was provided at the conclusion of the visit.      One unit [phone] was billed for Dr. [name] time spent preparing for, conducting, and documenting the current feedback session with [name].

## 2023-09-04 ENCOUNTER — Other Ambulatory Visit: Payer: Self-pay | Admitting: Cardiovascular Disease

## 2023-09-06 ENCOUNTER — Ambulatory Visit (INDEPENDENT_AMBULATORY_CARE_PROVIDER_SITE_OTHER): Payer: Medicare Other | Admitting: Family

## 2023-09-06 VITALS — BP 131/54 | HR 69 | Temp 97.7°F | Resp 16 | Ht 64.0 in | Wt 119.4 lb

## 2023-09-06 DIAGNOSIS — Z23 Encounter for immunization: Secondary | ICD-10-CM

## 2023-09-06 DIAGNOSIS — E11649 Type 2 diabetes mellitus with hypoglycemia without coma: Secondary | ICD-10-CM

## 2023-09-06 DIAGNOSIS — R296 Repeated falls: Secondary | ICD-10-CM

## 2023-09-06 DIAGNOSIS — G3184 Mild cognitive impairment, so stated: Secondary | ICD-10-CM

## 2023-09-06 DIAGNOSIS — Z7984 Long term (current) use of oral hypoglycemic drugs: Secondary | ICD-10-CM | POA: Diagnosis not present

## 2023-09-06 NOTE — Assessment & Plan Note (Signed)
No falls since las visit.

## 2023-09-06 NOTE — Assessment & Plan Note (Signed)
They would like help getting set up with new providers in [place].  I have placed a referral to the memory care clinic at [facility] for ongoing care for her memory loss as well as an Internal Medicine clinic at [facility].

## 2023-09-06 NOTE — Progress Notes (Signed)
Subjective:     Patient ID: Gail Lester, female    DOB: [birth year], 82 y.o.   MRN: [id number]  Chief Complaint  Patient presents with   Recurrent falls    Here for follow up. No Recent falls   Weight Loss    Here for follow up. Lost 2 more pounds.    HPI  Discussed the use of AI scribe software for clinical note transcription with the patient, who gave verbal consent to proceed. \ History of Present Illness The patient, with a history of falls, weight loss, and memory issues, is preparing to move to [place], [place] next week. She is accompanied by her son who is here visiting from [place]. The patient has stopped her sulfonylurea and has not had any more falls, suggesting that the falls may have been due to low blood sugar. The patient has been trying to eat three meals a day but is struggling with memory and discipline. We did complete a CT of the Chest/Abd/Pelvis- which did not show any sign of malignancy.  She has lost two additional pounds since her last visit.    Reports that home health PT came and did an assessment but quickly signed off as she was doing very well.   The patient is also dealing with some memory loss issues and recently met with Rosann Auerbach Phd for formal neurocognitive evaluation and Carlena Sax PA-C with Neurology.. 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  The patient is concerned about her memory and is considering medication to slow down memory loss.  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   Anemia 08/06/2023   Arthritis    Atypical chest pain  06/05/2022   Confusion and disorientation 08/01/2023   Constipation    Diarrhea    Disorder of capillaries 01/16/2016   Dupuytren's contracture of both hands 02/10/2013   Dysplastic nevus 10/11/2014   Epidermal cyst 01/21/2017   Fall 07/02/2023   Fibrocystic breast 02/10/2013   Left h/o FN biopsy    Ganglion cyst 11/14/2014   Right 2nd finger   GERD (gastroesophageal reflux disease) 10/09/2020   Hemangioma 09/28/2014   History of cataracts    Previous surgery to remove bilaterally   History of chicken pox    as a child   History of colonic polyp 11/21/2017   History of measles    as a child   History of mumps    as a child   Hypercholesteremia    Hyperlipidemia, mixed 02/10/2013   Melanocytic nevi, unspecified 09/28/2014   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 08/14/2015   Muscle cramps 04/24/2018   Neck pain 04/10/2021   Onychomycosis 02/24/2017   Osteopenia 05/23/2017   Osteoporosis 05/23/2017   Pulmonary nodules 08/18/2023   Recurrent falls 08/06/2023   Right knee pain 02/15/2017   Rotator cuff arthropathy 11/24/2014   Injected 11/24/14     Sacral pain 04/10/2021   Seborrheic keratosis 09/28/2014   Syncope and collapse 08/02/2023   Transient alteration of awareness 07/02/2023   Type 2 diabetes mellitus with  hypoglycemia 02/10/2013   Saw Dr Emily Filbert, eye doctor in 01/01/13, exam in chart      Past Surgical History:  Procedure Laterality Date   APPENDECTOMY  1955   BREAST BIOPSY  1985-1990   benign   EYE SURGERY Right 04/2019   cataract removal   EYE SURGERY Left 03/2020   cataract removal   ORIF WRIST FRACTURE  12/09/2011   Procedure: OPEN REDUCTION INTERNAL FIXATION (ORIF) WRIST FRACTURE;  Surgeon: Sharma Covert;  Location: [facility] OR;  Service: Orthopedics;  Laterality: Left;    Family History  Problem Relation Age of Onset   Kidney disease Mother    Alcoholism Mother    Diabetes Father    Kidney disease Maternal Grandmother    Cancer Maternal Grandmother         bladder   Cancer Maternal Aunt    Alzheimer's disease Maternal Aunt    Scoliosis Son    Colon cancer Other        grandparent    Social History   Socioeconomic History   Marital status: Divorced    Spouse name: Not on file   Number of children: 2   Years of education: 13   Highest education level: Some college, no degree  Occupational History   Occupation: Retired    Comment: Diplomatic Services operational officer  Tobacco Use   Smoking status: Former    Current packs/day: 0.00    Types: Cigarettes    Start date: 11/17/1974    Quit date: 11/17/1989    Years since quitting: 33.8   Smokeless tobacco: Never  Substance and Sexual Activity   Alcohol use: Yes    Comment: rarely while in social settings   Drug use: No   Sexual activity: Not Currently  Other Topics Concern   Not on file  Social History Narrative   Right handed   Two sons   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, 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 lives in [place]   Lives alone   Social Determinants of Health   Financial Resource Strain: Low Risk  (06/25/2023)   Overall Financial Resource Strain (CARDIA)    Difficulty of Paying Living Expenses: Not very hard  Food Insecurity: No Food Insecurity (08/13/2023)   Hunger Vital Sign    Worried About Running Out of Food in the Last Year: Never true    Ran Out of Food in the Last Year: Never true  Transportation Needs: No Transportation Needs (08/13/2023)   PRAPARE - Administrator, Civil Service (Medical): No    Lack of Transportation (Non-Medical): No  Physical Activity: Sufficiently Active (06/25/2023)   Exercise Vital Sign    Days of Exercise per Week: 7 days    Minutes of Exercise per Session: 30 min  Stress: Stress Concern Present (06/25/2023)   [employer] of Occupational Health - Occupational Stress Questionnaire    Feeling of Stress : To some extent  Social Connections: Socially Isolated (06/25/2023)   Social Connection and Isolation Panel [NHANES]    Frequency of Communication with  Friends and Family: Once a week    Frequency of Social Gatherings with Friends and Family: Never    Attends Religious Services: Never    Database administrator or Organizations: No    Attends Engineer, structural: Not on file    Marital Status: Divorced  Intimate Partner Violence: Not At Risk (08/02/2023)   Humiliation, Afraid, Rape, and Kick questionnaire    Fear of Current or Ex-Partner: No  Emotionally Abused: No    Physically Abused: No    Sexually Abused: No    Outpatient Medications Prior to Visit  Medication Sig Dispense Refill   Accu-Chek Softclix Lancets lancets USE AS DIRECTED 300 each 2   aspirin 81 MG tablet Take 81 mg by mouth daily.     Blood Glucose Monitoring Suppl (BLOOD GLUCOSE MONITOR SYSTEM) w/Device KIT Use in the morning, at noon, and at bedtime. 1 kit 0   CALCIUM PO Take 630 mg by mouth daily. Taking 2 tablets of calcium citrate 315 each - 630mg  total     Cholecalciferol (VITAMIN D3) 50 MCG (2000 UT) TABS Take 1 tablet by mouth daily.     Co-Enzyme Q10 100 MG CAPS Take 1 capsule by mouth daily.     glucosamine-chondroitin 500-400 MG tablet Take 1 tablet by mouth daily. 90 tablet 2   Iron-Vitamin C (IRON 100/C PO) Take 1 tablet by mouth daily.     Krill Oil 500 MG CAPS Take 1 capsule by mouth daily.     Lancets Misc. (ACCU-CHEK FASTCLIX LANCET) KIT Use to check sugar 4 times a day.  Dx code: E11.9 1 kit 0   LUTEIN PO Take 25 mg by mouth daily.     metFORMIN (GLUCOPHAGE-XR) 500 MG 24 hr tablet Take 1 tablet (500 mg total) by mouth in the morning, at noon, and at bedtime. 270 tablet 2   Multiple Vitamin (MULTIVITAMIN) tablet Take 1 tablet by mouth daily.      rosuvastatin (CRESTOR) 20 MG tablet TAKE 1 TABLET BY MOUTH DAILY 30 tablet 11   vitamin C (ASCORBIC ACID) 500 MG tablet Take 500 mg by mouth daily.     glimepiride (AMARYL) 4 MG tablet TAKE 1 TABLET BY MOUTH DAILY  WITH BREAKFAST 100 tablet 2   Apoaequorin (PREVAGEN PO) Take 1 tablet by mouth daily.      glimepiride (AMARYL) 1 MG tablet Take 1 tablet (1 mg total) by mouth daily with breakfast. (Patient not taking: Reported on 08/06/2023) 30 tablet 0   No facility-administered medications prior to visit.    No Known Allergies  ROS    See hpi Objective:    Physical Exam Constitutional:      General: She is not in acute distress.    Appearance: Normal appearance. She is well-developed.  HENT:     Head: Normocephalic and atraumatic.     Right Ear: External ear normal.     Left Ear: External ear normal.  Eyes:     General: No scleral icterus. Neck:     Thyroid: No thyromegaly.  Cardiovascular:     Rate and Rhythm: Normal rate and regular rhythm.     Heart sounds: Normal heart sounds. No murmur heard. Pulmonary:     Effort: Pulmonary effort is normal. No respiratory distress.     Breath sounds: Normal breath sounds. No wheezing.  Musculoskeletal:     Cervical back: Neck supple.  Skin:    General: Skin is warm and dry.  Neurological:     Mental Status: She is alert and oriented to person, place, and time.  Psychiatric:        Mood and Affect: Mood normal.        Behavior: Behavior normal.        Thought Content: Thought content normal.        Judgment: Judgment normal.      BP (!) 131/54 (BP Location: Left Arm, Patient Position: Sitting, Cuff Size: Normal)  Pulse 69   Temp 97.7 F (36.5 C) (Oral)   Resp 16   Ht 5\' 4"  (1.626 m)   Wt 119 lb 6.4 oz (54.2 kg)   SpO2 98%   BMI 20.49 kg/m  Wt Readings from Last 3 Encounters:  09/06/23 119 lb 6.4 oz (54.2 kg)  08/30/23 121 lb 9.6 oz (55.2 kg)  08/16/23 124 lb (56.2 kg)       Assessment & Plan:   Problem List Items Addressed This Visit       Unprioritized   Type 2 diabetes mellitus with hypoglycemia    No falls since last visit suggesting that low blood sugar is most likely cause of her falls. Remain off of amaryl.Continue metformin.   Did well with PT, no further PT needs.   Lab Results  Component Value  Date   HGBA1C 5.6 08/04/2023   HGBA1C 6.3 07/02/2023   HGBA1C 8.2 (H) 12/11/2022   Lab Results  Component Value Date   MICROALBUR 1.7 12/11/2022   LDLCALC 57 07/02/2023   CREATININE 0.68 08/04/2023         Recurrent falls    No falls since las visit.       Amnestic MCI (mild cognitive impairment with memory loss)    They would like help getting set up with new providers in [place].  I have placed a referral to the memory care clinic at [facility] for ongoing care for her memory loss as well as an Internal Medicine clinic at [facility].       Other Visit Diagnoses     Needs flu shot    -  Primary   Relevant Orders   Flu Vaccine Trivalent High Dose (Fluad) (Completed)   Mild neurocognitive disorder       Relevant Orders   Ambulatory referral to Neurology   Ambulatory referral to Internal Medicine      30 minutes spent on today's visit. Time was spent counseling patient on her medical issues and arranging out of state referral.  I have discontinued Shakerria T. Ickes's Apoaequorin (PREVAGEN PO), glimepiride, and glimepiride. I am also having her maintain her aspirin, multivitamin, CALCIUM PO, Krill Oil, Vitamin D3, LUTEIN PO, Accu-Chek FastClix Lancet, Iron-Vitamin C (IRON 100/C PO), ascorbic acid, Co-Enzyme Q10, Accu-Chek Softclix Lancets, metFORMIN, Blood Glucose Monitor System, glucosamine-chondroitin, and rosuvastatin.  No orders of the defined types were placed in this encounter.

## 2023-09-06 NOTE — Patient Instructions (Signed)
Care & TreatmentShow submenu Find A DoctorShow submenu Find a LocationShow submenu Patient & Family ResourcesShow submenu Search I want to expand/collapse Home  Patient & Family Resources  Medical Records  Patient & Family Resources Advance Care Planning Billing & Insurance Classes, Events and Support Groups Compliments and Concerns Dining, Vending & Meal Service Ethics Consultation Wi-Fi Gift Shops & Retail Boutiques Licensed conveyancer Medical Records Parking & Shuttle Services Patient Rights & Responsibilities Patient Safety Support Groups Prepare for Your Hospital Stay Prescription Drug Print production planner Spiritual Care Tobacco-Free [place] Visiting Hours & Policies Get Care Now Medical Records Electronic Medical Records  Your medical record documents your health and health care. It may include information such as:  Your health history Imaging and lab test results Treatments you received Medicine you've been prescribed Any allergies you have With [facility]--our electronic medical record system--you and your [facility] care team gain secure, real-time access to your complete [facility] medical records. That means both you and your care team have a current, complete picture of your health so you benefit from:  Enhanced safety, quality and continuity of care Improved communication Immediate availability of information to assist your care team in prompt diagnosis and treatment Increased opportunities for your health care providers to coordinate your care Your Rights Regarding Your Medical Records/Protected Health Information You have the right to review and receive copies of your medical records, subject to legal restrictions and any appropriate copying or retrieval charge(s). You can also designate someone to obtain your records on your behalf. [place] will not release your medical records without your consent, except as required or permitted  by [facility] and Freight forwarder.  MyChart MyChart is a free online tool provided by [employer]  electronic medical record and billing system--that serves as a centralized location for your health information. MyChart enables you to see key parts of your medical record, communicate with your physician and engage in your own health care.  You can also use MyChart to manage the protected health information of other family members, such as an aging parent (with his or her permission) or child, by setting up a proxy account.  Learn more about MyChart, the proxy process, and to set up your free account today!  Obtain Copies of Your Medical Records Birth Certificate Requests Copies of birth certificates should be requested at the Register of Deeds in the county in which the individual was born.  [place] If you would like to receive copies of your medical record or have them sent to someone else, please download and complete the [facility] Patient Access Form. To request medical records from [facility] or receive medical records that must be certified, please use the Authorization for Disclosure of PHI.  There is no charge to have your medical records sent to another healthcare provider. For other requests, fees may apply. Allow up 10-14 business days to receive copies of your medical records after receipt of your written request.  English Request & Authorization For Use / Disclosure Of Protected Health Information Spanish Peticin Y Autorizacin Para Uso / Divulgacin De Informacin Mdica Protegida [Word doc] (Spanish) Phone/Fax Requests If your request is for an upcoming medical appointment or procedure, please call [phone] and we can send your information to the appropriate medical facility.  If you need to request medical records from a [facility] or Practice, please fax or return the request form to your physician's  office/practice.  If you have questions  about requesting your medical records from a [facility], please contact the following Health Information Management offices:  Phone: [phone] Fax: [phone] Email Requests You may also email your request/questions to [email or web address]. Please include your name, date of birth, what date you were treated and the facility where you were treated  Mail Requests: You may mail any request to:  [facility] Information Management Department [address], [place] [address]

## 2023-09-06 NOTE — Assessment & Plan Note (Addendum)
No falls since last visit suggesting that low blood sugar is most likely cause of her falls. Remain off of amaryl.Continue metformin.   Did well with PT, no further PT needs.   Lab Results  Component Value Date   HGBA1C 5.6 08/04/2023   HGBA1C 6.3 07/02/2023   HGBA1C 8.2 (H) 12/11/2022   Lab Results  Component Value Date   MICROALBUR 1.7 12/11/2022   LDLCALC 57 07/02/2023   CREATININE 0.68 08/04/2023

## 2023-09-10 ENCOUNTER — Ambulatory Visit: Payer: Medicare Other | Admitting: Neurology

## 2023-09-10 DIAGNOSIS — R55 Syncope and collapse: Secondary | ICD-10-CM | POA: Diagnosis not present

## 2023-09-12 DIAGNOSIS — R55 Syncope and collapse: Secondary | ICD-10-CM | POA: Diagnosis not present

## 2023-09-17 ENCOUNTER — Ambulatory Visit: Payer: Medicare Other | Admitting: Family Medicine

## 2023-09-19 ENCOUNTER — Encounter: Payer: Self-pay | Admitting: Family

## 2023-09-30 NOTE — Procedures (Signed)
ELECTROENCEPHALOGRAM REPORT  Dates of Recording: 09/10/2023 2:21PM to 09/12/2023  Patient's Name: Gail Lester MRN: [id number] Date of Birth: 02/06/1941  Referring Provider: Marlowe Kays, PA-C  Procedure: 49-hour ambulatory video EEG  History: This is an 82 year old woman with recurrent episodes of confusion and syncope. 24-hour EEG in the hospital noted sharp transients in the left temporal region, limited by electrode artifact. Outpatient ambulatory EEG ordered for further characterization.   Medications: aspirin, Metformin, Rosuvastatin  Technical Summary: This is a 49-hour multichannel digital video EEG recording measured by the international 10-20 system with electrodes applied with paste and impedances below 5000 ohms performed as portable with EKG monitoring.  The digital EEG was referentially recorded, reformatted, and digitally filtered in a variety of bipolar and referential montages for optimal display.    DESCRIPTION OF RECORDING: During maximal wakefulness, the background activity consisted of a symmetric 9 Hz posterior dominant rhythm which was reactive to eye opening.  There were no epileptiform discharges or focal slowing seen in wakefulness.  During the recording, the patient progresses through wakefulness, drowsiness, and Stage 2 sleep.  Again, there were no epileptiform discharges seen.  Events: Diary not submitted. There were 7 push button events that appear accidental, 2 with video, no clinical changes seen.   There were no electrographic seizures seen.  EKG lead was unremarkable.  IMPRESSION: This 49-hour ambulatory video EEG study is normal.    CLINICAL CORRELATION: A normal EEG does not exclude a clinical diagnosis of epilepsy. Typical events were not captured. If further clinical questions remain, inpatient video EEG monitoring may be helpful.   Patrcia Dolly, M.D.

## 2023-09-30 NOTE — Progress Notes (Signed)
EEG was normal, no seizure activity seen, thanks

## 2023-10-01 ENCOUNTER — Ambulatory Visit: Payer: Medicare Other | Admitting: Physician Assistant

## 2023-10-02 ENCOUNTER — Encounter: Payer: Medicare Other | Admitting: Psychology

## 2023-10-04 ENCOUNTER — Telehealth: Payer: Self-pay

## 2023-10-07 ENCOUNTER — Telehealth: Payer: Self-pay | Admitting: Family

## 2023-10-07 NOTE — Telephone Encounter (Signed)
[facility] representative notified HH certification of care form is at the office and will be completed and faxed back this week.  No need to re-fax.

## 2023-10-07 NOTE — Telephone Encounter (Signed)
Burna Mortimer with [facility] called to check status of an order that was faxed on 10/03/23. I was unable to locate it. Please call Burna Mortimer to advise status. She will refax as well

## 2023-10-10 DIAGNOSIS — I10 Essential (primary) hypertension: Secondary | ICD-10-CM | POA: Diagnosis not present

## 2023-10-10 DIAGNOSIS — Z7982 Long term (current) use of aspirin: Secondary | ICD-10-CM | POA: Diagnosis not present

## 2023-10-10 DIAGNOSIS — Z9181 History of falling: Secondary | ICD-10-CM | POA: Diagnosis not present

## 2023-10-10 DIAGNOSIS — Z7984 Long term (current) use of oral hypoglycemic drugs: Secondary | ICD-10-CM | POA: Diagnosis not present

## 2023-10-10 DIAGNOSIS — K219 Gastro-esophageal reflux disease without esophagitis: Secondary | ICD-10-CM | POA: Diagnosis not present

## 2023-10-10 DIAGNOSIS — E119 Type 2 diabetes mellitus without complications: Secondary | ICD-10-CM | POA: Diagnosis not present

## 2023-10-10 DIAGNOSIS — E782 Mixed hyperlipidemia: Secondary | ICD-10-CM | POA: Diagnosis not present

## 2023-12-31 ENCOUNTER — Encounter: Payer: Medicare Other | Admitting: Family Medicine

## 2024-06-11 ENCOUNTER — Telehealth: Payer: Self-pay | Admitting: Family

## 2024-06-11 NOTE — Telephone Encounter (Signed)
 Copied from CRM [id number]. Topic: Medicare AWV >> Jun 11, 2024  1:39 PM Juliana Ocean wrote: Reason for CRM: LVM 06/11/2024 to schedule AWV. Please schedule Virtual or Telehealth visits ONLY.   Gail Lester; Care Guide Ambulatory Clinical Support [place] [facility] Direct Dial : [phone]

## 2024-06-30 ENCOUNTER — Ambulatory Visit: Payer: Self-pay

## 2024-06-30 ENCOUNTER — Institutional Professional Consult (permissible substitution): Payer: Self-pay | Admitting: Psychology

## 2024-06-30 ENCOUNTER — Institutional Professional Consult (permissible substitution): Payer: Medicare Other | Admitting: Psychology

## 2024-07-07 ENCOUNTER — Ambulatory Visit: Payer: Medicare Other | Admitting: Psychology

## 2024-07-09 ENCOUNTER — Encounter: Payer: Medicare Other | Admitting: Psychology

## 2024-08-03 ENCOUNTER — Telehealth: Payer: Self-pay | Admitting: Family

## 2024-08-03 DIAGNOSIS — R918 Other nonspecific abnormal finding of lung field: Secondary | ICD-10-CM

## 2024-08-03 NOTE — Telephone Encounter (Signed)
 See mychart.

## 2024-12-21 ENCOUNTER — Telehealth: Payer: Self-pay | Admitting: Family

## 2024-12-21 NOTE — Telephone Encounter (Signed)
 Copied from CRM #[id number]. Topic: Medicare AWV >> Dec 21, 2024  9:46 AM Nathanel DEL wrote: Called LVM 12/21/2024 to sched AWVS. Please schedule AWVS in office only  Nathanel Paschal; Care Guide Ambulatory Clinical Support [place] [facility] Direct Dial : [phone]
